# Patient Record
Sex: Female | Born: 1950 | Race: Black or African American | Hispanic: No | Marital: Married | Smoking: Never smoker
Health system: Southern US, Community
[De-identification: ages and names within clinical notes are randomized; demographics above are authoritative.]

## PROBLEM LIST (undated history)

## (undated) DIAGNOSIS — E669 Obesity, unspecified: Secondary | ICD-10-CM

## (undated) DIAGNOSIS — R7303 Prediabetes: Secondary | ICD-10-CM

## (undated) DIAGNOSIS — M1990 Unspecified osteoarthritis, unspecified site: Secondary | ICD-10-CM

## (undated) DIAGNOSIS — I10 Essential (primary) hypertension: Secondary | ICD-10-CM

## (undated) DIAGNOSIS — D649 Anemia, unspecified: Secondary | ICD-10-CM

## (undated) DIAGNOSIS — Z9221 Personal history of antineoplastic chemotherapy: Secondary | ICD-10-CM

## (undated) DIAGNOSIS — Z923 Personal history of irradiation: Secondary | ICD-10-CM

## (undated) DIAGNOSIS — C50919 Malignant neoplasm of unspecified site of unspecified female breast: Secondary | ICD-10-CM

## (undated) DIAGNOSIS — N289 Disorder of kidney and ureter, unspecified: Secondary | ICD-10-CM

## (undated) DIAGNOSIS — G4730 Sleep apnea, unspecified: Secondary | ICD-10-CM

## (undated) DIAGNOSIS — E559 Vitamin D deficiency, unspecified: Secondary | ICD-10-CM

## (undated) HISTORY — DX: Disorder of kidney and ureter, unspecified: N28.9

## (undated) HISTORY — DX: Vitamin D deficiency, unspecified: E55.9

## (undated) HISTORY — DX: Anemia, unspecified: D64.9

## (undated) HISTORY — PX: BREAST LUMPECTOMY: SHX2

## (undated) HISTORY — DX: Sleep apnea, unspecified: G47.30

## (undated) HISTORY — PX: ABDOMINAL HYSTERECTOMY: SHX81

## (undated) HISTORY — DX: Prediabetes: R73.03

## (undated) HISTORY — DX: Malignant neoplasm of unspecified site of unspecified female breast: C50.919

## (undated) HISTORY — DX: Obesity, unspecified: E66.9

## (undated) HISTORY — DX: Essential (primary) hypertension: I10

---

## 1997-09-25 ENCOUNTER — Other Ambulatory Visit: Admission: RE | Admit: 1997-09-25 | Discharge: 1997-09-25 | Payer: Self-pay | Admitting: Obstetrics and Gynecology

## 1999-05-24 ENCOUNTER — Encounter: Admission: RE | Admit: 1999-05-24 | Discharge: 1999-05-24 | Payer: Self-pay | Admitting: Obstetrics and Gynecology

## 1999-05-24 ENCOUNTER — Encounter: Payer: Self-pay | Admitting: Obstetrics and Gynecology

## 1999-06-01 ENCOUNTER — Other Ambulatory Visit: Admission: RE | Admit: 1999-06-01 | Discharge: 1999-06-01 | Payer: Self-pay | Admitting: Obstetrics and Gynecology

## 2000-08-03 ENCOUNTER — Encounter: Payer: Self-pay | Admitting: Obstetrics and Gynecology

## 2000-08-03 ENCOUNTER — Encounter: Admission: RE | Admit: 2000-08-03 | Discharge: 2000-08-03 | Payer: Self-pay | Admitting: Obstetrics and Gynecology

## 2000-09-14 ENCOUNTER — Other Ambulatory Visit: Admission: RE | Admit: 2000-09-14 | Discharge: 2000-09-14 | Payer: Self-pay | Admitting: Obstetrics and Gynecology

## 2000-10-12 ENCOUNTER — Encounter: Payer: Self-pay | Admitting: Obstetrics and Gynecology

## 2000-10-12 ENCOUNTER — Encounter: Admission: RE | Admit: 2000-10-12 | Discharge: 2000-10-12 | Payer: Self-pay | Admitting: Obstetrics and Gynecology

## 2000-12-05 ENCOUNTER — Ambulatory Visit (HOSPITAL_COMMUNITY): Admission: RE | Admit: 2000-12-05 | Discharge: 2000-12-05 | Payer: Self-pay | Admitting: Gastroenterology

## 2001-08-06 ENCOUNTER — Encounter: Payer: Self-pay | Admitting: Obstetrics and Gynecology

## 2001-08-06 ENCOUNTER — Encounter: Admission: RE | Admit: 2001-08-06 | Discharge: 2001-08-06 | Payer: Self-pay | Admitting: Obstetrics and Gynecology

## 2002-03-14 ENCOUNTER — Encounter: Payer: Self-pay | Admitting: Internal Medicine

## 2002-03-14 ENCOUNTER — Encounter: Admission: RE | Admit: 2002-03-14 | Discharge: 2002-03-14 | Payer: Self-pay | Admitting: Internal Medicine

## 2003-10-20 ENCOUNTER — Ambulatory Visit (HOSPITAL_COMMUNITY): Admission: RE | Admit: 2003-10-20 | Discharge: 2003-10-20 | Payer: Self-pay | Admitting: Obstetrics and Gynecology

## 2005-02-28 ENCOUNTER — Ambulatory Visit (HOSPITAL_COMMUNITY): Admission: RE | Admit: 2005-02-28 | Discharge: 2005-02-28 | Payer: Self-pay | Admitting: Obstetrics and Gynecology

## 2009-01-22 ENCOUNTER — Ambulatory Visit (HOSPITAL_COMMUNITY): Admission: RE | Admit: 2009-01-22 | Discharge: 2009-01-22 | Payer: Self-pay | Admitting: Obstetrics and Gynecology

## 2009-01-22 IMAGING — MG MM DIGITAL SCREENING
5 series · 5 of 5 positions shown · non-contrast
Comparison: none

DG SCREEN MAMMOGRAM BILATERAL
Bilateral CC and MLO view(s) were taken.

DIGITAL SCREENING MAMMOGRAM WITH CAD:
There are scattered fibroglandular densities.  No masses or malignant type calcifications are 
identified.
Images were processed with CAD.

[R CC]
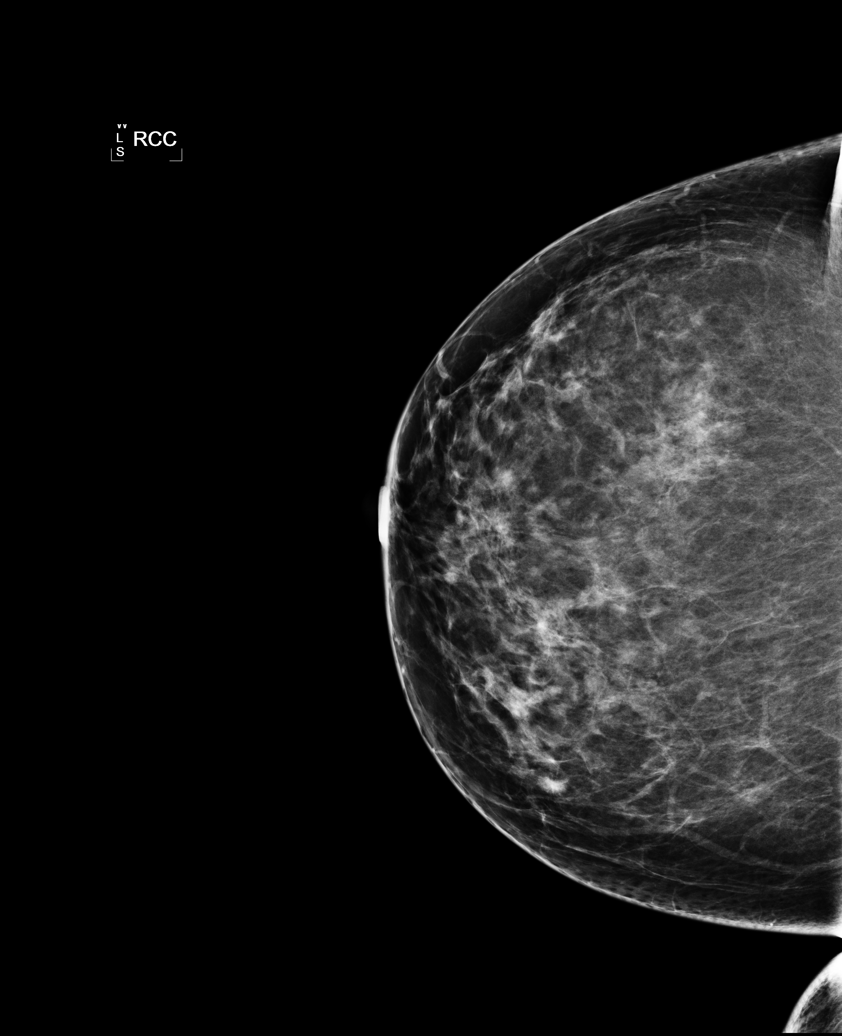

[R MLO]
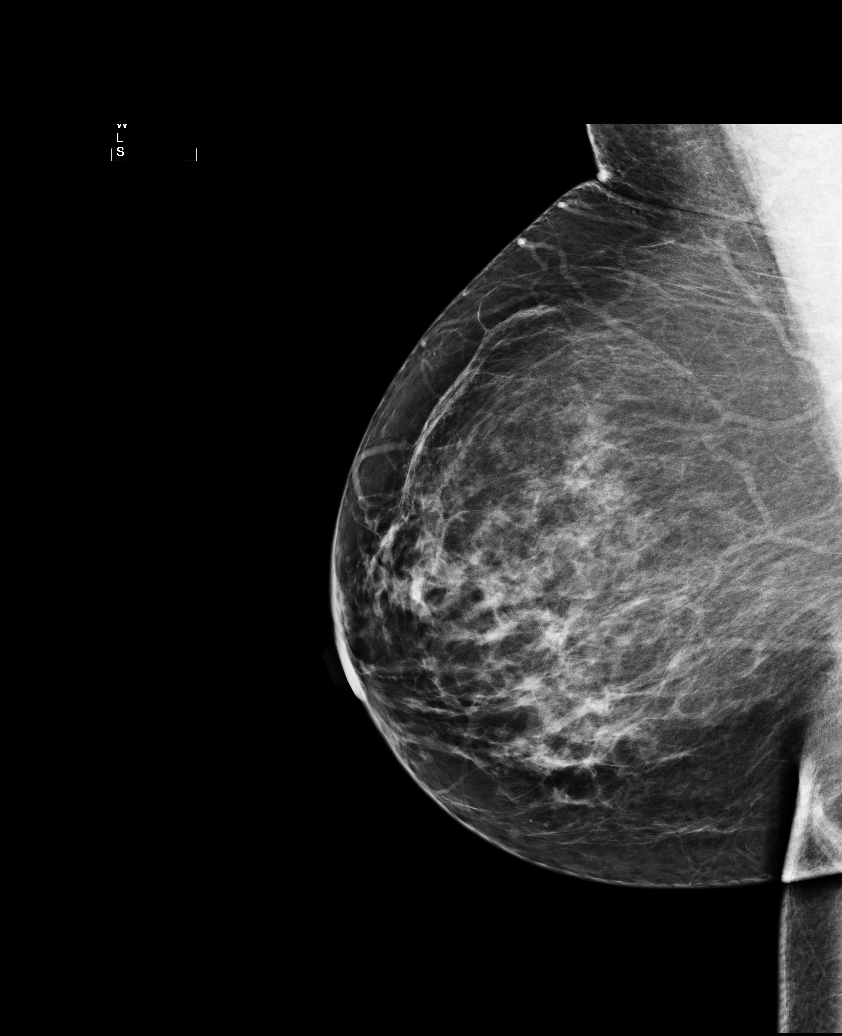

[L CC]
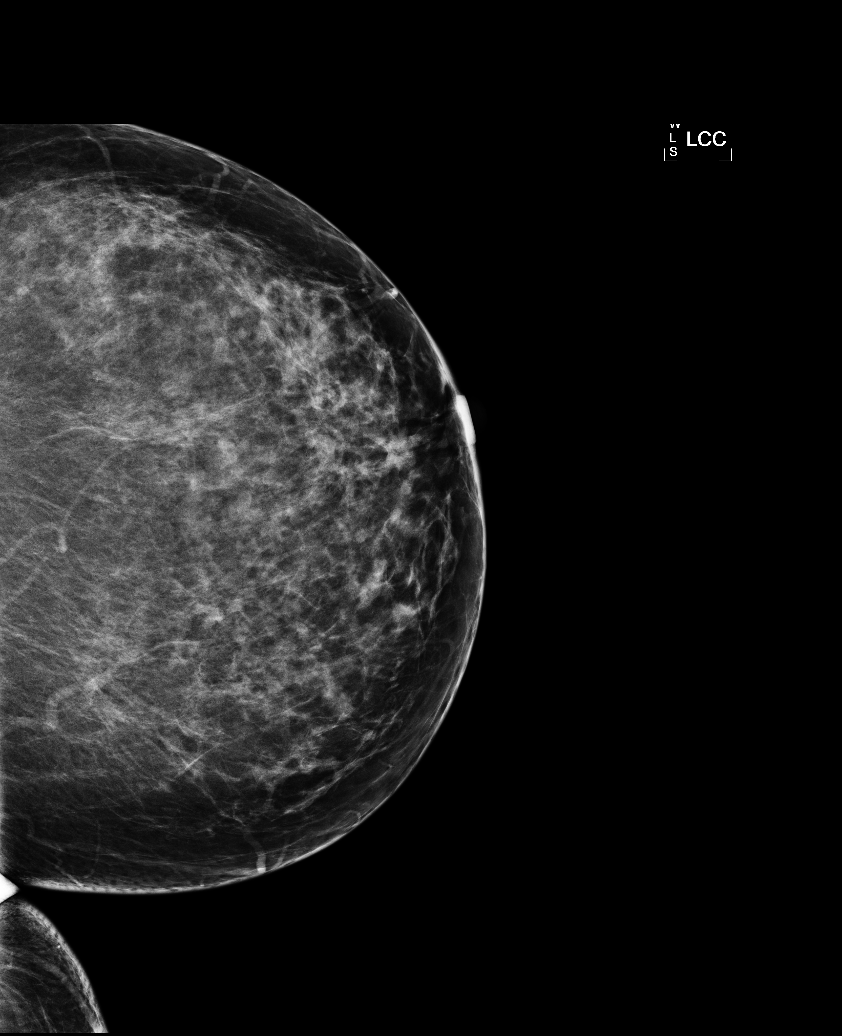

[L MLO (1 of 2)]
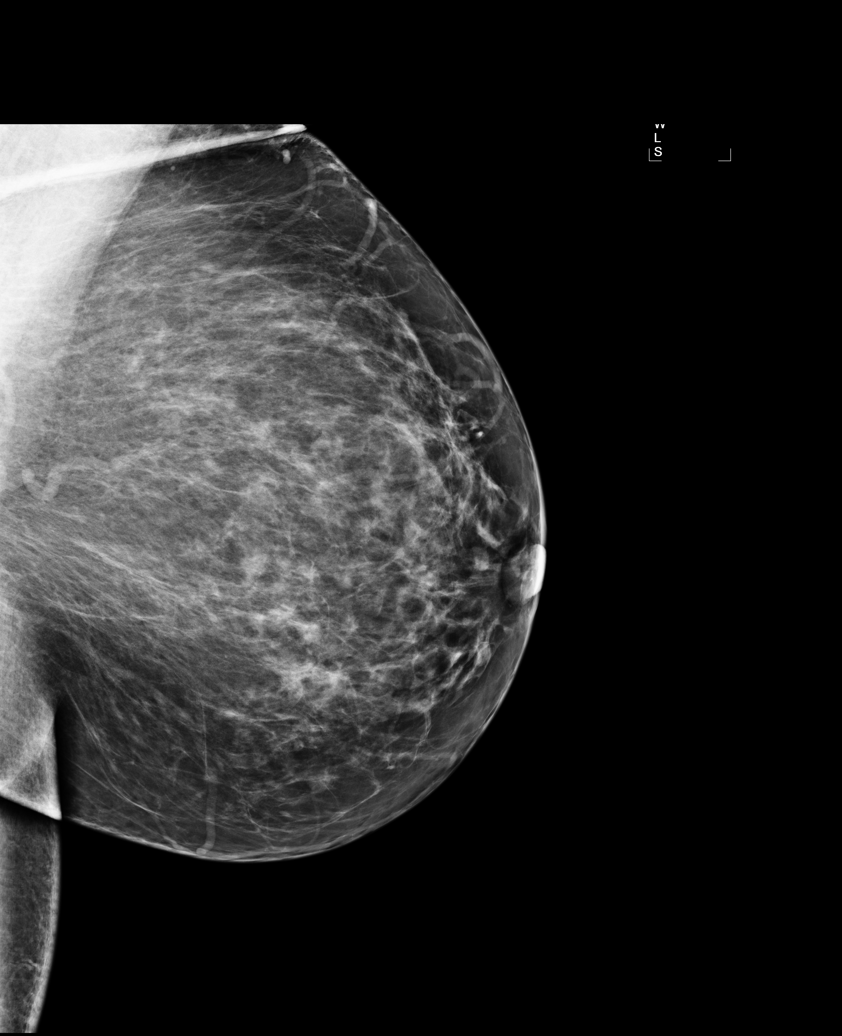

[L MLO (2 of 2)]
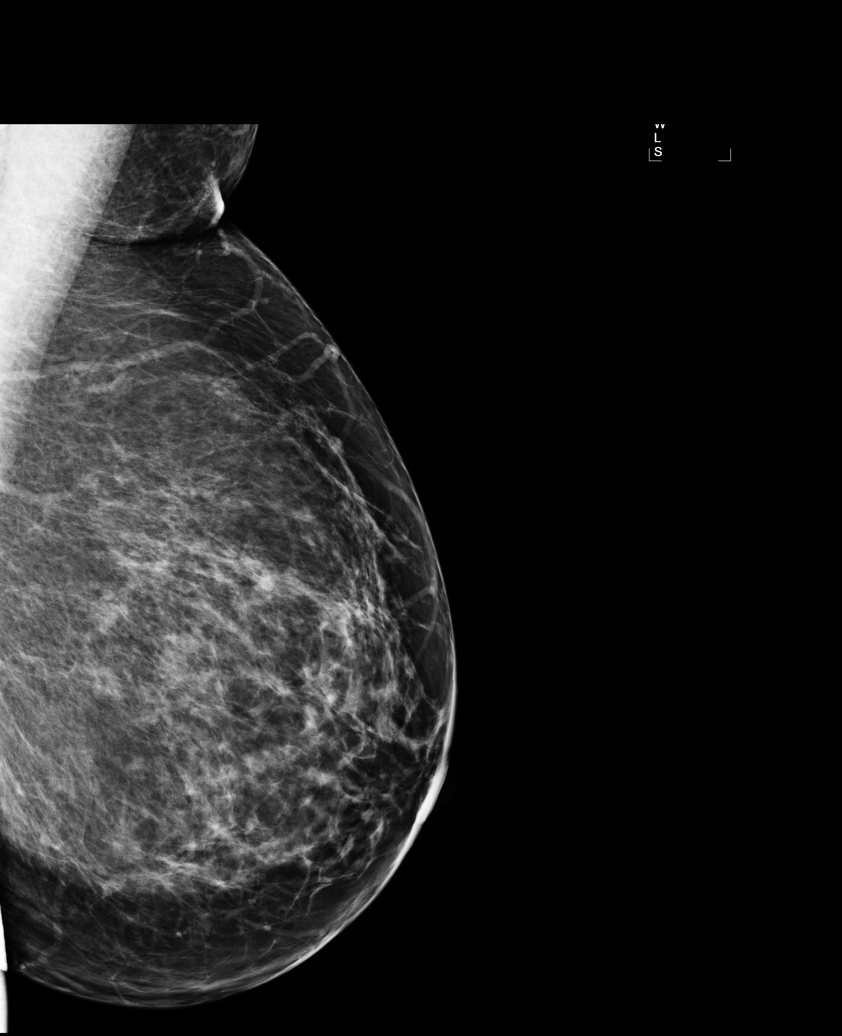

[5 of 5 positions shown; findings below may reference images not displayed]

IMPRESSION: No specific mammographic evidence of malignancy.  Next screening mammogram is recommended in one 
year.

A result letter of this screening mammogram will be mailed directly to the patient.

ASSESSMENT: Negative - BI-RADS 1

Screening mammogram in 1 year.
,

## 2009-03-11 ENCOUNTER — Emergency Department (HOSPITAL_COMMUNITY): Admission: EM | Admit: 2009-03-11 | Discharge: 2009-03-11 | Payer: Self-pay | Admitting: Family Medicine

## 2009-03-11 IMAGING — CR DG CHEST 2V
2 series · 2 of 2 positions shown · non-contrast
Comparison: None.

CLINICAL DATA: Cold.  Cough.  Congestion.

CHEST - 2 VIEW

[view not recorded (1 of 2)]
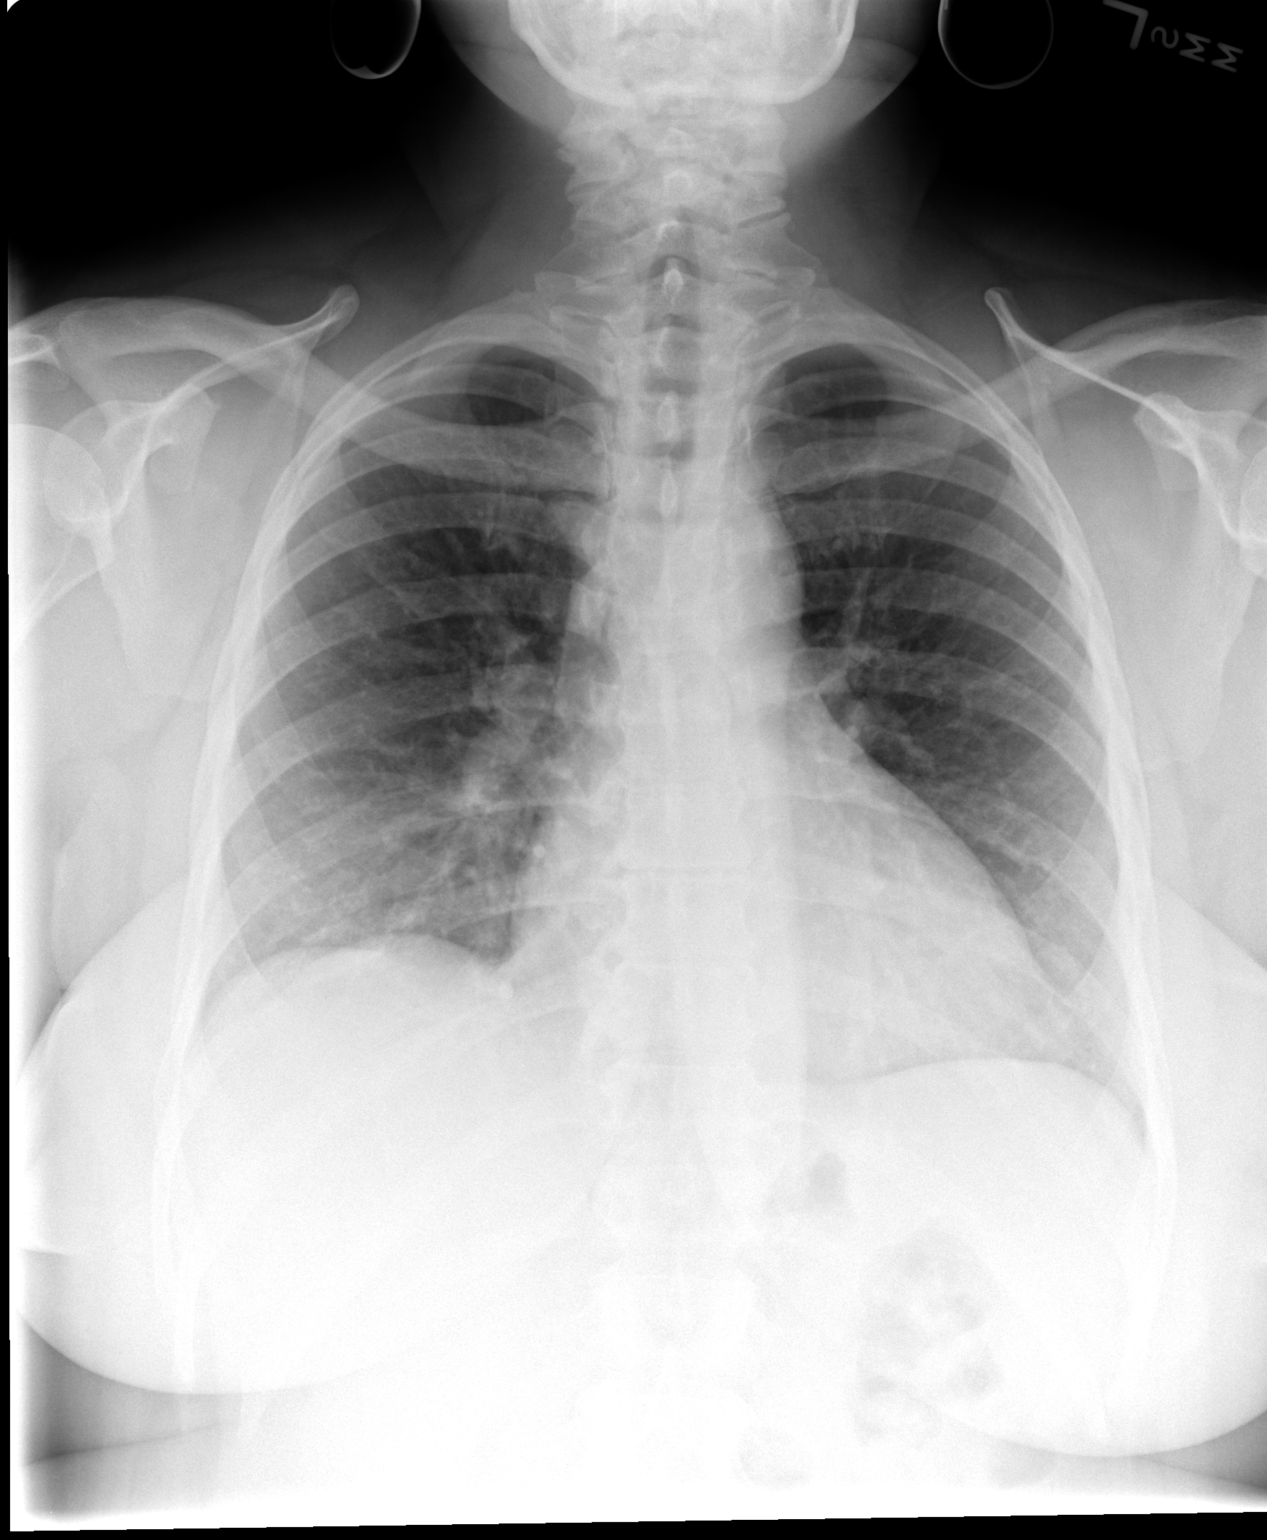

[view not recorded (2 of 2)]
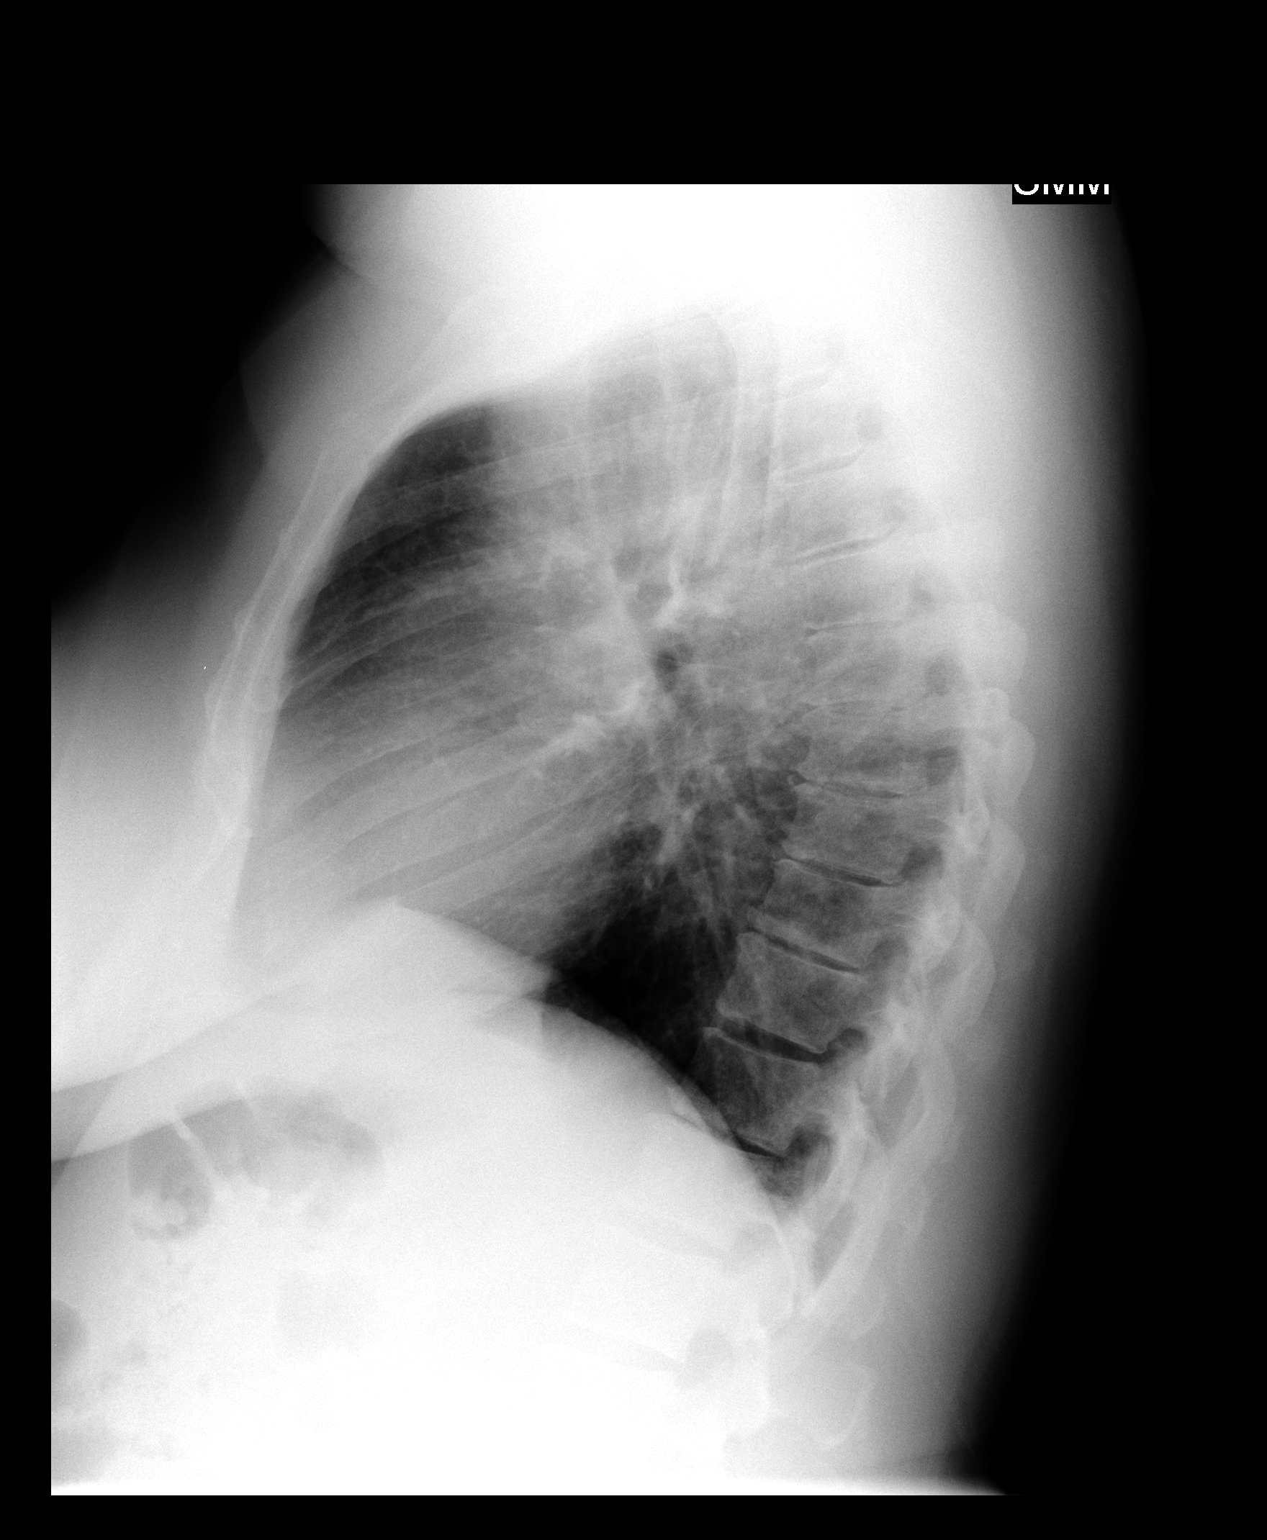

[2 of 2 positions shown; findings below may reference images not displayed]

FINDINGS: Normal cardiomediastinal silhouette.  Central bronchial
wall thickening.  Bilateral diffuse accentuation of
peribronchial/interstitial markings.  Findings are compatible with
bronchitis.  No infiltrate.  Intact bony thorax.  Mild spondylosis.
IMPRESSION: Findings compatible with chronic bronchitis.  Negative for
pneumonia.

## 2009-09-24 ENCOUNTER — Ambulatory Visit: Payer: Self-pay | Admitting: Family Medicine

## 2009-09-24 DIAGNOSIS — M19079 Primary osteoarthritis, unspecified ankle and foot: Secondary | ICD-10-CM | POA: Insufficient documentation

## 2009-09-24 DIAGNOSIS — M2020 Hallux rigidus, unspecified foot: Secondary | ICD-10-CM | POA: Insufficient documentation

## 2009-12-15 ENCOUNTER — Telehealth (INDEPENDENT_AMBULATORY_CARE_PROVIDER_SITE_OTHER): Payer: Self-pay | Admitting: *Deleted

## 2009-12-18 ENCOUNTER — Ambulatory Visit: Payer: Self-pay | Admitting: Family Medicine

## 2010-01-31 ENCOUNTER — Encounter: Payer: Self-pay | Admitting: Obstetrics and Gynecology

## 2010-02-09 NOTE — Assessment & Plan Note (Signed)
Summary: FEET ISSUES,MC   Vital Signs:  Patient profile:   60 year old female BP sitting:   144 / 90  History of Present Illness: 60 yo F here for bilateral foot pain  Patient reports having pain on inside of both great toes for past 3 weeks No known injury Pain centered  around 1st MTP from where she points. Some swelling especially on top of foot in this area Has to wear sandals because other shoes put pressure on this and it huts. 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 Has not tried any medicines for this.  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  No bunions or hallux valgus.  No redness or warmth. Mod-severe hallux rigidus with only  ~10-20 degrees dorsiflexion bilateral 1st MTPs. Pain on passive ROM of 1st MTPs. Long arch fairly well preserved.  No callus plantar aspect of metatarsal heads. 1+ BLE pitting edema.   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  Treat with topical voltaren if covered by insurance (mobic if not).  Needs wider and taller toebox in shoes - consider running shoes that allow more give.  Will try to buy some of these.  Pull insole out and use comforthotics - will add 1st ray post bilaterally tomorrow (no charge for the posting and no appointment needed).  See instructions for further.  This should improve over the next 4-6 wks.  Discussed x-rays and shots if not improving but shots not as effective for DJD of these joints.  Orders: [facility]  [employer] [phone])  Problem # 2:  ARTHRITIS, LEFT FOOT (ICD-716.97) Assessment: New see #1 above.  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 Apply small amount to affected areas qid 2)  Meloxicam 15 Mg Tabs (Meloxicam) .Marland Kitchen.. 1 tab by mouth daily with food  Patient Instructions: 1)  It was a pleasure to meet you today. 2)  You have arthritis in the joint at the base of your toe (MTP joint). 3)  Go to a running store ([employer], [employer]) and try on different shoes with a wide toe box - they can help you with this at these stores. 4)  Take the insole out of the shoes and put the green insole in them. 5)  Come back tomorrow afternoon (no appointment needed and I will not charge you for this) and I will put additional padding under your toes on the insert to help with pain and pushoff. 6)  Use the topical antiinflammatory gel up to 4 times a day regularly.  If insurance will not cover this, we can try a medicine by mouth once a day. 7)  Follow up in 3-4 weeks.  If not improving with this treatment, would consider either custom orthotics and/or injection into these joints. Prescriptions: MELOXICAM 15 MG TABS (MELOXICAM) 1 tab by mouth daily with food  #30 x 1   Entered  and Authorized by:   Norton Blizzard MD   Signed by:   Norton Blizzard MD on 09/24/2009   Method used:   Print then Give to Patient   RxID:   [id number] VOLTAREN 1 % GEL (DICLOFENAC SODIUM) Apply small amount to affected areas qid  #3 x 100g x 1   Entered and Authorized by:   Norton Blizzard MD   Signed by:   Norton Blizzard MD on 09/24/2009   Method used:   Print then Give to Patient   RxID:   [id number]

## 2010-02-09 NOTE — Progress Notes (Signed)
Summary: phone call  Phone Note Call from Patient   Caller: Patient Summary of Call: Spoke with patient about medication to make sure the patient was not using both. Patient informed that they could not afford the gel, but did get the oral medicaiton. Made appointment to see physician on Friday 12-18-09.  Initial call taken by: Kathi Simpers [facility]),  December 15, 2009 9:10 AM

## 2010-02-11 NOTE — Assessment & Plan Note (Signed)
Summary: FOOT PAIN/LP   Vital Signs:  Patient profile:   60 year old female Weight:      228.8 pounds Temp:     98.0 degrees F Pulse rate:   80 / minute BP sitting:   155 / 85  History of Present Illness: 60 yo F here for f/u bilateral 1st MTP DJD  Patient has taken mobic with some relief but not as much as she would like. Unable to try voltaren gel because she had filled meloxicam prior to this (we called and informed her again that she should only be taking one or the other). Continues to have pain in 1st MTPs with limited motion of these No known injury Not using inserts because she prefers shoes (bought danzgos) that these do not fit into Likes to walk but no other exercise.   Problems Prior to Update: 1)  Arthritis, Right Foot  (ICD-716.97) 2)  Arthritis, Left Foot  (ICD-716.97) 3)  Hallux Rigidus  (ICD-735.2)  Allergies: No Known Drug Allergies  Physical Exam  General:  NAD, overweight Msk:  Bilateral feet: swelling dorsal aspect of bilateral 1st MTPs.  No bunions or hallux valgus.  Crepitation noted.  No redness or warmth. Mod-severe hallux rigidus with only  ~10 degrees dorsiflexion bilateral 1st MTPs. Pain on passive ROM of 1st MTPs. Long arch fairly well preserved.  No callus plantar aspect of metatarsal heads.   Impression & Recommendations:  Problem # 1:  ARTHRITIS, RIGHT FOOT (ICD-716.97) Assessment Unchanged Would like to continue to pursue non-surgical options.  Stop meloxicam and try voltaren gel - may need prior authorization.  Less systemic absorption with gel so this may be better for her blood pressure as well.  Again discussed comfortable shoes.  Consider [employer] store to see if they have any cushions she could fit in her shoes to help under 1st MTP as first ray post.  Discussed injections and surgery (fusion) if not improving but would likely defer these discussions to ortho if conservative therapies not helping adequately.  Problem # 2:   ARTHRITIS, LEFT FOOT (ICD-716.97) Assessment: Unchanged See #1 above.  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 Apply small amount to affected areas qid 2)  Meloxicam 15 Mg Tabs (Meloxicam) .Marland Kitchen.. 1 tab by mouth daily with food   Orders Added: 1)  Est. Patient Level III [81191]

## 2010-03-13 ENCOUNTER — Encounter: Payer: Self-pay | Admitting: *Deleted

## 2010-05-28 NOTE — Procedures (Signed)
[place]  Patient:    Cheyenne Mcdonald, GORIS Visit Number: [id number] MRN: [id number]          Service Type: END Location: ENDO Attending Physician:  Charna Elizabeth Dictated by:   Anselmo Rod, M.D. Proc. Date: 12/05/00 Admit Date:  12/05/2000   CC:         Sheronette A. Cherly Hensen, M.D.  Kern Reap, M.D.   Procedure Report  DATE OF BIRTH:  05/22/1950.  PROCEDURE:  Colonoscopy.  ENDOSCOPIST:  Anselmo Rod, M.D.  INSTRUMENT USED:  Olympus video colonoscope.  INDICATION FOR PROCEDURE:  Rectal bleeding in a 61 year old African-American female.  Rule out colonic polyps, masses, hemorrhoids, etc.  PREPROCEDURE PREPARATION:  Informed consent was procured from the patient. The patient was fasted for eight hours prior to the procedure and prepped with a bottle of magnesium citrate and a gallon of NuLytely the night prior to the procedure.  She also received 400 mg of IV Cipro for mitral valve prophylaxis prior to the procedure.  PREPROCEDURE PHYSICAL:  VITAL SIGNS:  The patient had stable vital signs.  NECK:  Supple.  CHEST:  Clear to auscultation.  S1, S2 regular.  ABDOMEN:  Soft with normal bowel sounds.  DESCRIPTION OF PROCEDURE:  The patient was placed in the left lateral decubitus position and sedated with 60 mg of Demerol and 6 mg of Versed intravenously.  Once the patient was adequately sedate and maintained on low-flow oxygen and continuous cardiac monitoring, the Olympus video colonoscope was advanced from the rectum to the cecum without difficulty. The patient had a healthy-appearing colon.  No masses, polyps, erosions, ulcerations, or diverticula were seen.  Small internal hemorrhoids were appreciated on retroflexion in the rectum.  The patient tolerated the procedure well without complications.  IMPRESSION:  Normal colonoscopy except for small, nonbleeding internal hemorrhoid.  RECOMMENDATIONS: 1. A high-fiber  diet has been recommended along with liberal fluid intake, and    outpatient follow-up is advised as the need arises. 2. Repeat colorectal cancer screening is recommended in the next five years    unless the patient were to develop any abnormal symptoms in the interim. Dictated by:   Anselmo Rod, M.D. Attending Physician:  Charna Elizabeth DD:  12/05/00 TD:  12/06/00 Job: [id number]

## 2017-01-15 DIAGNOSIS — G4733 Obstructive sleep apnea (adult) (pediatric): Secondary | ICD-10-CM | POA: Diagnosis not present

## 2017-01-18 DIAGNOSIS — I119 Hypertensive heart disease without heart failure: Secondary | ICD-10-CM | POA: Diagnosis not present

## 2017-01-18 DIAGNOSIS — I10 Essential (primary) hypertension: Secondary | ICD-10-CM | POA: Diagnosis not present

## 2017-02-15 DIAGNOSIS — G4733 Obstructive sleep apnea (adult) (pediatric): Secondary | ICD-10-CM | POA: Diagnosis not present

## 2017-03-01 DIAGNOSIS — I10 Essential (primary) hypertension: Secondary | ICD-10-CM | POA: Diagnosis not present

## 2017-03-01 DIAGNOSIS — G4733 Obstructive sleep apnea (adult) (pediatric): Secondary | ICD-10-CM | POA: Diagnosis not present

## 2017-03-15 DIAGNOSIS — G4733 Obstructive sleep apnea (adult) (pediatric): Secondary | ICD-10-CM | POA: Diagnosis not present

## 2017-03-20 DIAGNOSIS — G4733 Obstructive sleep apnea (adult) (pediatric): Secondary | ICD-10-CM | POA: Diagnosis not present

## 2017-03-21 DIAGNOSIS — K5904 Chronic idiopathic constipation: Secondary | ICD-10-CM | POA: Diagnosis not present

## 2017-03-21 DIAGNOSIS — Z1211 Encounter for screening for malignant neoplasm of colon: Secondary | ICD-10-CM | POA: Diagnosis not present

## 2017-03-22 DIAGNOSIS — I10 Essential (primary) hypertension: Secondary | ICD-10-CM | POA: Diagnosis not present

## 2017-03-22 DIAGNOSIS — G4733 Obstructive sleep apnea (adult) (pediatric): Secondary | ICD-10-CM | POA: Diagnosis not present

## 2017-03-30 ENCOUNTER — Other Ambulatory Visit: Payer: Self-pay | Admitting: Internal Medicine

## 2017-03-30 DIAGNOSIS — Z1231 Encounter for screening mammogram for malignant neoplasm of breast: Secondary | ICD-10-CM

## 2017-04-05 DIAGNOSIS — H25813 Combined forms of age-related cataract, bilateral: Secondary | ICD-10-CM | POA: Diagnosis not present

## 2017-04-05 DIAGNOSIS — H40023 Open angle with borderline findings, high risk, bilateral: Secondary | ICD-10-CM | POA: Diagnosis not present

## 2017-04-05 DIAGNOSIS — H04123 Dry eye syndrome of bilateral lacrimal glands: Secondary | ICD-10-CM | POA: Diagnosis not present

## 2017-04-15 DIAGNOSIS — G4733 Obstructive sleep apnea (adult) (pediatric): Secondary | ICD-10-CM | POA: Diagnosis not present

## 2017-04-19 DIAGNOSIS — Z1211 Encounter for screening for malignant neoplasm of colon: Secondary | ICD-10-CM | POA: Diagnosis not present

## 2017-04-20 ENCOUNTER — Ambulatory Visit
Admission: RE | Admit: 2017-04-20 | Discharge: 2017-04-20 | Disposition: A | Discharge status: Home / Self Care | Payer: Medicare HMO | Source: Ambulatory Visit | Attending: Internal Medicine | Admitting: Internal Medicine

## 2017-04-20 ENCOUNTER — Ambulatory Visit: Payer: Self-pay

## 2017-04-20 DIAGNOSIS — Z1231 Encounter for screening mammogram for malignant neoplasm of breast: Secondary | ICD-10-CM

## 2017-04-24 DIAGNOSIS — M1711 Unilateral primary osteoarthritis, right knee: Secondary | ICD-10-CM | POA: Diagnosis not present

## 2017-04-24 DIAGNOSIS — G629 Polyneuropathy, unspecified: Secondary | ICD-10-CM | POA: Diagnosis not present

## 2017-04-24 DIAGNOSIS — I10 Essential (primary) hypertension: Secondary | ICD-10-CM | POA: Diagnosis not present

## 2017-04-24 DIAGNOSIS — Z131 Encounter for screening for diabetes mellitus: Secondary | ICD-10-CM | POA: Diagnosis not present

## 2017-04-24 DIAGNOSIS — I119 Hypertensive heart disease without heart failure: Secondary | ICD-10-CM | POA: Diagnosis not present

## 2017-04-24 DIAGNOSIS — Z136 Encounter for screening for cardiovascular disorders: Secondary | ICD-10-CM | POA: Diagnosis not present

## 2017-04-24 DIAGNOSIS — Z0000 Encounter for general adult medical examination without abnormal findings: Secondary | ICD-10-CM | POA: Diagnosis not present

## 2017-04-24 DIAGNOSIS — E559 Vitamin D deficiency, unspecified: Secondary | ICD-10-CM | POA: Diagnosis not present

## 2017-04-24 DIAGNOSIS — G4733 Obstructive sleep apnea (adult) (pediatric): Secondary | ICD-10-CM | POA: Diagnosis not present

## 2017-04-24 DIAGNOSIS — H9209 Otalgia, unspecified ear: Secondary | ICD-10-CM | POA: Diagnosis not present

## 2017-04-24 DIAGNOSIS — R7303 Prediabetes: Secondary | ICD-10-CM | POA: Diagnosis not present

## 2017-04-24 DIAGNOSIS — Z0001 Encounter for general adult medical examination with abnormal findings: Secondary | ICD-10-CM | POA: Diagnosis not present

## 2017-04-24 DIAGNOSIS — Z01118 Encounter for examination of ears and hearing with other abnormal findings: Secondary | ICD-10-CM | POA: Diagnosis not present

## 2017-04-26 DIAGNOSIS — I10 Essential (primary) hypertension: Secondary | ICD-10-CM | POA: Diagnosis not present

## 2017-04-26 DIAGNOSIS — I119 Hypertensive heart disease without heart failure: Secondary | ICD-10-CM | POA: Diagnosis not present

## 2017-05-15 DIAGNOSIS — G4733 Obstructive sleep apnea (adult) (pediatric): Secondary | ICD-10-CM | POA: Diagnosis not present

## 2017-05-30 DIAGNOSIS — G629 Polyneuropathy, unspecified: Secondary | ICD-10-CM | POA: Diagnosis not present

## 2017-05-30 DIAGNOSIS — R7303 Prediabetes: Secondary | ICD-10-CM | POA: Diagnosis not present

## 2017-05-30 DIAGNOSIS — I10 Essential (primary) hypertension: Secondary | ICD-10-CM | POA: Diagnosis not present

## 2017-05-30 DIAGNOSIS — E559 Vitamin D deficiency, unspecified: Secondary | ICD-10-CM | POA: Diagnosis not present

## 2017-05-30 DIAGNOSIS — J302 Other seasonal allergic rhinitis: Secondary | ICD-10-CM | POA: Diagnosis not present

## 2017-05-30 DIAGNOSIS — G4733 Obstructive sleep apnea (adult) (pediatric): Secondary | ICD-10-CM | POA: Diagnosis not present

## 2017-05-30 DIAGNOSIS — M1711 Unilateral primary osteoarthritis, right knee: Secondary | ICD-10-CM | POA: Diagnosis not present

## 2017-05-30 DIAGNOSIS — I119 Hypertensive heart disease without heart failure: Secondary | ICD-10-CM | POA: Diagnosis not present

## 2017-06-15 DIAGNOSIS — G4733 Obstructive sleep apnea (adult) (pediatric): Secondary | ICD-10-CM | POA: Diagnosis not present

## 2017-07-11 DIAGNOSIS — I119 Hypertensive heart disease without heart failure: Secondary | ICD-10-CM | POA: Diagnosis not present

## 2017-07-11 DIAGNOSIS — G4733 Obstructive sleep apnea (adult) (pediatric): Secondary | ICD-10-CM | POA: Diagnosis not present

## 2017-07-11 DIAGNOSIS — J302 Other seasonal allergic rhinitis: Secondary | ICD-10-CM | POA: Diagnosis not present

## 2017-07-11 DIAGNOSIS — I10 Essential (primary) hypertension: Secondary | ICD-10-CM | POA: Diagnosis not present

## 2017-07-11 DIAGNOSIS — E559 Vitamin D deficiency, unspecified: Secondary | ICD-10-CM | POA: Diagnosis not present

## 2017-07-11 DIAGNOSIS — M1711 Unilateral primary osteoarthritis, right knee: Secondary | ICD-10-CM | POA: Diagnosis not present

## 2017-07-11 DIAGNOSIS — G629 Polyneuropathy, unspecified: Secondary | ICD-10-CM | POA: Diagnosis not present

## 2017-07-11 DIAGNOSIS — R7303 Prediabetes: Secondary | ICD-10-CM | POA: Diagnosis not present

## 2017-07-15 DIAGNOSIS — G4733 Obstructive sleep apnea (adult) (pediatric): Secondary | ICD-10-CM | POA: Diagnosis not present

## 2017-08-15 DIAGNOSIS — G4733 Obstructive sleep apnea (adult) (pediatric): Secondary | ICD-10-CM | POA: Diagnosis not present

## 2017-09-15 DIAGNOSIS — G4733 Obstructive sleep apnea (adult) (pediatric): Secondary | ICD-10-CM | POA: Diagnosis not present

## 2017-10-09 DIAGNOSIS — I10 Essential (primary) hypertension: Secondary | ICD-10-CM | POA: Diagnosis not present

## 2017-10-09 DIAGNOSIS — G4733 Obstructive sleep apnea (adult) (pediatric): Secondary | ICD-10-CM | POA: Diagnosis not present

## 2017-10-09 DIAGNOSIS — J302 Other seasonal allergic rhinitis: Secondary | ICD-10-CM | POA: Diagnosis not present

## 2017-10-09 DIAGNOSIS — I119 Hypertensive heart disease without heart failure: Secondary | ICD-10-CM | POA: Diagnosis not present

## 2017-10-09 DIAGNOSIS — E559 Vitamin D deficiency, unspecified: Secondary | ICD-10-CM | POA: Diagnosis not present

## 2017-10-09 DIAGNOSIS — R7303 Prediabetes: Secondary | ICD-10-CM | POA: Diagnosis not present

## 2017-10-09 DIAGNOSIS — M1711 Unilateral primary osteoarthritis, right knee: Secondary | ICD-10-CM | POA: Diagnosis not present

## 2017-10-09 DIAGNOSIS — G629 Polyneuropathy, unspecified: Secondary | ICD-10-CM | POA: Diagnosis not present

## 2017-10-11 DIAGNOSIS — H25813 Combined forms of age-related cataract, bilateral: Secondary | ICD-10-CM | POA: Diagnosis not present

## 2017-10-11 DIAGNOSIS — H40023 Open angle with borderline findings, high risk, bilateral: Secondary | ICD-10-CM | POA: Diagnosis not present

## 2017-10-11 DIAGNOSIS — H04123 Dry eye syndrome of bilateral lacrimal glands: Secondary | ICD-10-CM | POA: Diagnosis not present

## 2017-10-15 DIAGNOSIS — G4733 Obstructive sleep apnea (adult) (pediatric): Secondary | ICD-10-CM | POA: Diagnosis not present

## 2017-10-30 DIAGNOSIS — R7303 Prediabetes: Secondary | ICD-10-CM | POA: Diagnosis not present

## 2017-10-30 DIAGNOSIS — E559 Vitamin D deficiency, unspecified: Secondary | ICD-10-CM | POA: Diagnosis not present

## 2017-10-30 DIAGNOSIS — E785 Hyperlipidemia, unspecified: Secondary | ICD-10-CM | POA: Diagnosis not present

## 2017-10-30 DIAGNOSIS — Z131 Encounter for screening for diabetes mellitus: Secondary | ICD-10-CM | POA: Diagnosis not present

## 2017-10-30 DIAGNOSIS — M1711 Unilateral primary osteoarthritis, right knee: Secondary | ICD-10-CM | POA: Diagnosis not present

## 2017-10-30 DIAGNOSIS — G629 Polyneuropathy, unspecified: Secondary | ICD-10-CM | POA: Diagnosis not present

## 2017-10-30 DIAGNOSIS — N182 Chronic kidney disease, stage 2 (mild): Secondary | ICD-10-CM | POA: Diagnosis not present

## 2017-10-30 DIAGNOSIS — E669 Obesity, unspecified: Secondary | ICD-10-CM | POA: Diagnosis not present

## 2017-10-30 DIAGNOSIS — Z0001 Encounter for general adult medical examination with abnormal findings: Secondary | ICD-10-CM | POA: Diagnosis not present

## 2017-10-30 DIAGNOSIS — I10 Essential (primary) hypertension: Secondary | ICD-10-CM | POA: Diagnosis not present

## 2017-10-30 DIAGNOSIS — G4733 Obstructive sleep apnea (adult) (pediatric): Secondary | ICD-10-CM | POA: Diagnosis not present

## 2017-10-30 DIAGNOSIS — Z23 Encounter for immunization: Secondary | ICD-10-CM | POA: Diagnosis not present

## 2017-10-30 DIAGNOSIS — I119 Hypertensive heart disease without heart failure: Secondary | ICD-10-CM | POA: Diagnosis not present

## 2017-11-15 DIAGNOSIS — G4733 Obstructive sleep apnea (adult) (pediatric): Secondary | ICD-10-CM | POA: Diagnosis not present

## 2017-12-01 DIAGNOSIS — N182 Chronic kidney disease, stage 2 (mild): Secondary | ICD-10-CM | POA: Diagnosis not present

## 2017-12-01 DIAGNOSIS — I10 Essential (primary) hypertension: Secondary | ICD-10-CM | POA: Diagnosis not present

## 2017-12-01 DIAGNOSIS — N390 Urinary tract infection, site not specified: Secondary | ICD-10-CM | POA: Diagnosis not present

## 2017-12-01 DIAGNOSIS — E559 Vitamin D deficiency, unspecified: Secondary | ICD-10-CM | POA: Diagnosis not present

## 2017-12-12 DIAGNOSIS — N182 Chronic kidney disease, stage 2 (mild): Secondary | ICD-10-CM | POA: Diagnosis not present

## 2017-12-15 DIAGNOSIS — G4733 Obstructive sleep apnea (adult) (pediatric): Secondary | ICD-10-CM | POA: Diagnosis not present

## 2017-12-18 DIAGNOSIS — G4733 Obstructive sleep apnea (adult) (pediatric): Secondary | ICD-10-CM | POA: Diagnosis not present

## 2017-12-18 DIAGNOSIS — I10 Essential (primary) hypertension: Secondary | ICD-10-CM | POA: Diagnosis not present

## 2017-12-18 DIAGNOSIS — M1711 Unilateral primary osteoarthritis, right knee: Secondary | ICD-10-CM | POA: Diagnosis not present

## 2017-12-18 DIAGNOSIS — I119 Hypertensive heart disease without heart failure: Secondary | ICD-10-CM | POA: Diagnosis not present

## 2017-12-18 DIAGNOSIS — N182 Chronic kidney disease, stage 2 (mild): Secondary | ICD-10-CM | POA: Diagnosis not present

## 2017-12-18 DIAGNOSIS — E559 Vitamin D deficiency, unspecified: Secondary | ICD-10-CM | POA: Diagnosis not present

## 2017-12-18 DIAGNOSIS — R7303 Prediabetes: Secondary | ICD-10-CM | POA: Diagnosis not present

## 2017-12-18 DIAGNOSIS — E782 Mixed hyperlipidemia: Secondary | ICD-10-CM | POA: Diagnosis not present

## 2017-12-18 DIAGNOSIS — L659 Nonscarring hair loss, unspecified: Secondary | ICD-10-CM | POA: Diagnosis not present

## 2017-12-22 DIAGNOSIS — N182 Chronic kidney disease, stage 2 (mild): Secondary | ICD-10-CM | POA: Diagnosis not present

## 2017-12-22 DIAGNOSIS — I10 Essential (primary) hypertension: Secondary | ICD-10-CM | POA: Diagnosis not present

## 2018-01-08 DIAGNOSIS — N390 Urinary tract infection, site not specified: Secondary | ICD-10-CM | POA: Diagnosis not present

## 2018-01-08 DIAGNOSIS — N182 Chronic kidney disease, stage 2 (mild): Secondary | ICD-10-CM | POA: Diagnosis not present

## 2018-01-08 DIAGNOSIS — E559 Vitamin D deficiency, unspecified: Secondary | ICD-10-CM | POA: Diagnosis not present

## 2018-01-08 DIAGNOSIS — I10 Essential (primary) hypertension: Secondary | ICD-10-CM | POA: Diagnosis not present

## 2018-01-15 DIAGNOSIS — G4733 Obstructive sleep apnea (adult) (pediatric): Secondary | ICD-10-CM | POA: Diagnosis not present

## 2018-01-15 DIAGNOSIS — I10 Essential (primary) hypertension: Secondary | ICD-10-CM | POA: Diagnosis not present

## 2018-01-15 DIAGNOSIS — E782 Mixed hyperlipidemia: Secondary | ICD-10-CM | POA: Diagnosis not present

## 2018-01-15 DIAGNOSIS — I119 Hypertensive heart disease without heart failure: Secondary | ICD-10-CM | POA: Diagnosis not present

## 2018-01-15 DIAGNOSIS — R7303 Prediabetes: Secondary | ICD-10-CM | POA: Diagnosis not present

## 2018-01-15 DIAGNOSIS — E559 Vitamin D deficiency, unspecified: Secondary | ICD-10-CM | POA: Diagnosis not present

## 2018-01-15 DIAGNOSIS — M1711 Unilateral primary osteoarthritis, right knee: Secondary | ICD-10-CM | POA: Diagnosis not present

## 2018-01-15 DIAGNOSIS — N182 Chronic kidney disease, stage 2 (mild): Secondary | ICD-10-CM | POA: Diagnosis not present

## 2018-01-15 DIAGNOSIS — E669 Obesity, unspecified: Secondary | ICD-10-CM | POA: Diagnosis not present

## 2018-05-17 DIAGNOSIS — E559 Vitamin D deficiency, unspecified: Secondary | ICD-10-CM | POA: Diagnosis not present

## 2018-05-17 DIAGNOSIS — I10 Essential (primary) hypertension: Secondary | ICD-10-CM | POA: Diagnosis not present

## 2018-05-17 DIAGNOSIS — N183 Chronic kidney disease, stage 3 (moderate): Secondary | ICD-10-CM | POA: Diagnosis not present

## 2018-05-17 DIAGNOSIS — N182 Chronic kidney disease, stage 2 (mild): Secondary | ICD-10-CM | POA: Diagnosis not present

## 2018-05-21 DIAGNOSIS — I10 Essential (primary) hypertension: Secondary | ICD-10-CM | POA: Diagnosis not present

## 2018-05-21 DIAGNOSIS — N182 Chronic kidney disease, stage 2 (mild): Secondary | ICD-10-CM | POA: Diagnosis not present

## 2018-05-21 DIAGNOSIS — E559 Vitamin D deficiency, unspecified: Secondary | ICD-10-CM | POA: Diagnosis not present

## 2018-05-21 DIAGNOSIS — N390 Urinary tract infection, site not specified: Secondary | ICD-10-CM | POA: Diagnosis not present

## 2018-06-14 DIAGNOSIS — H04123 Dry eye syndrome of bilateral lacrimal glands: Secondary | ICD-10-CM | POA: Diagnosis not present

## 2018-06-14 DIAGNOSIS — H25813 Combined forms of age-related cataract, bilateral: Secondary | ICD-10-CM | POA: Diagnosis not present

## 2018-06-14 DIAGNOSIS — H40023 Open angle with borderline findings, high risk, bilateral: Secondary | ICD-10-CM | POA: Diagnosis not present

## 2018-06-28 DIAGNOSIS — E559 Vitamin D deficiency, unspecified: Secondary | ICD-10-CM | POA: Diagnosis not present

## 2018-06-28 DIAGNOSIS — N183 Chronic kidney disease, stage 3 (moderate): Secondary | ICD-10-CM | POA: Diagnosis not present

## 2018-06-28 DIAGNOSIS — I10 Essential (primary) hypertension: Secondary | ICD-10-CM | POA: Diagnosis not present

## 2018-06-28 DIAGNOSIS — N182 Chronic kidney disease, stage 2 (mild): Secondary | ICD-10-CM | POA: Diagnosis not present

## 2018-07-02 DIAGNOSIS — E559 Vitamin D deficiency, unspecified: Secondary | ICD-10-CM | POA: Diagnosis not present

## 2018-07-02 DIAGNOSIS — N182 Chronic kidney disease, stage 2 (mild): Secondary | ICD-10-CM | POA: Diagnosis not present

## 2018-07-02 DIAGNOSIS — N390 Urinary tract infection, site not specified: Secondary | ICD-10-CM | POA: Diagnosis not present

## 2018-07-02 DIAGNOSIS — I10 Essential (primary) hypertension: Secondary | ICD-10-CM | POA: Diagnosis not present

## 2018-07-23 ENCOUNTER — Other Ambulatory Visit: Payer: Self-pay | Admitting: Internal Medicine

## 2018-07-23 DIAGNOSIS — Z1231 Encounter for screening mammogram for malignant neoplasm of breast: Secondary | ICD-10-CM

## 2018-09-05 ENCOUNTER — Other Ambulatory Visit: Payer: Self-pay

## 2018-09-05 ENCOUNTER — Ambulatory Visit
Admission: RE | Admit: 2018-09-05 | Discharge: 2018-09-05 | Disposition: A | Discharge status: Home / Self Care | Payer: Medicare HMO | Source: Ambulatory Visit | Attending: Internal Medicine | Admitting: Internal Medicine

## 2018-09-05 DIAGNOSIS — Z1231 Encounter for screening mammogram for malignant neoplasm of breast: Secondary | ICD-10-CM | POA: Diagnosis not present

## 2018-09-05 IMAGING — MG DIGITAL SCREENING BILATERAL MAMMOGRAM WITH TOMO AND CAD
6 of 12 series · 6 of 36 positions shown · non-contrast
Comparison: Previous exam(s).

CLINICAL DATA: Screening.

EXAM:
DIGITAL SCREENING BILATERAL MAMMOGRAM WITH TOMO AND CAD

[L MLO synth-2D (1 of 2)]
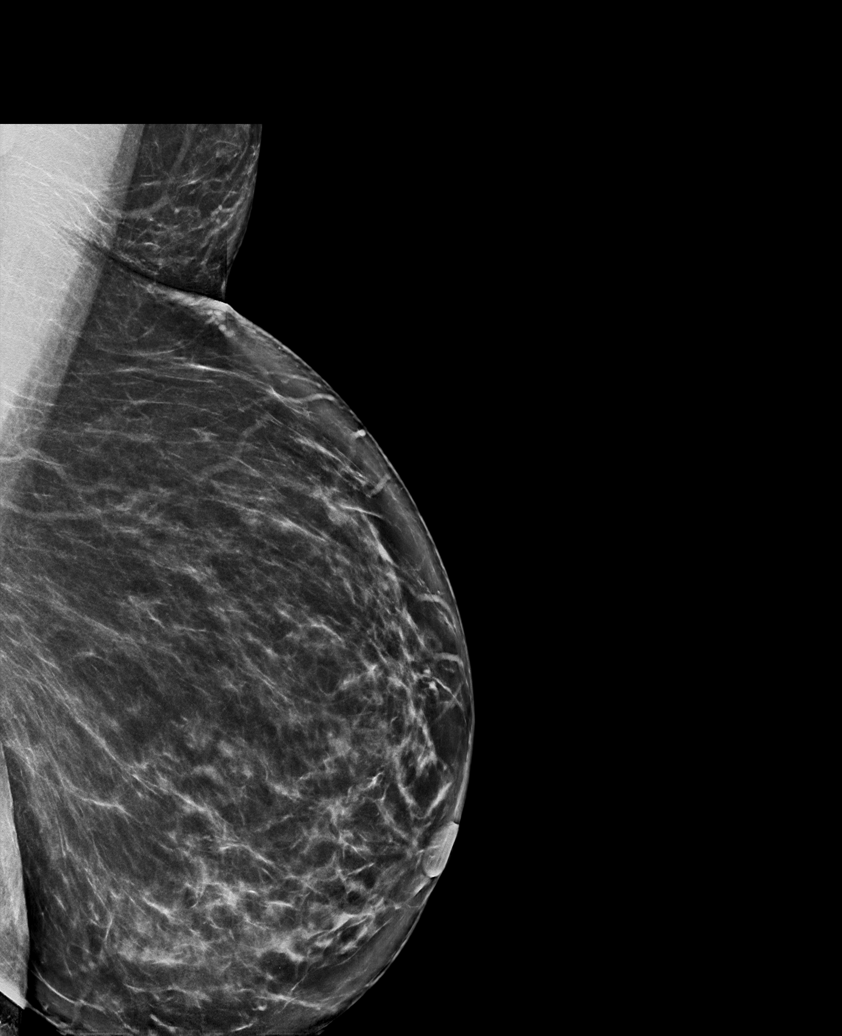

[R CC synth-2D]
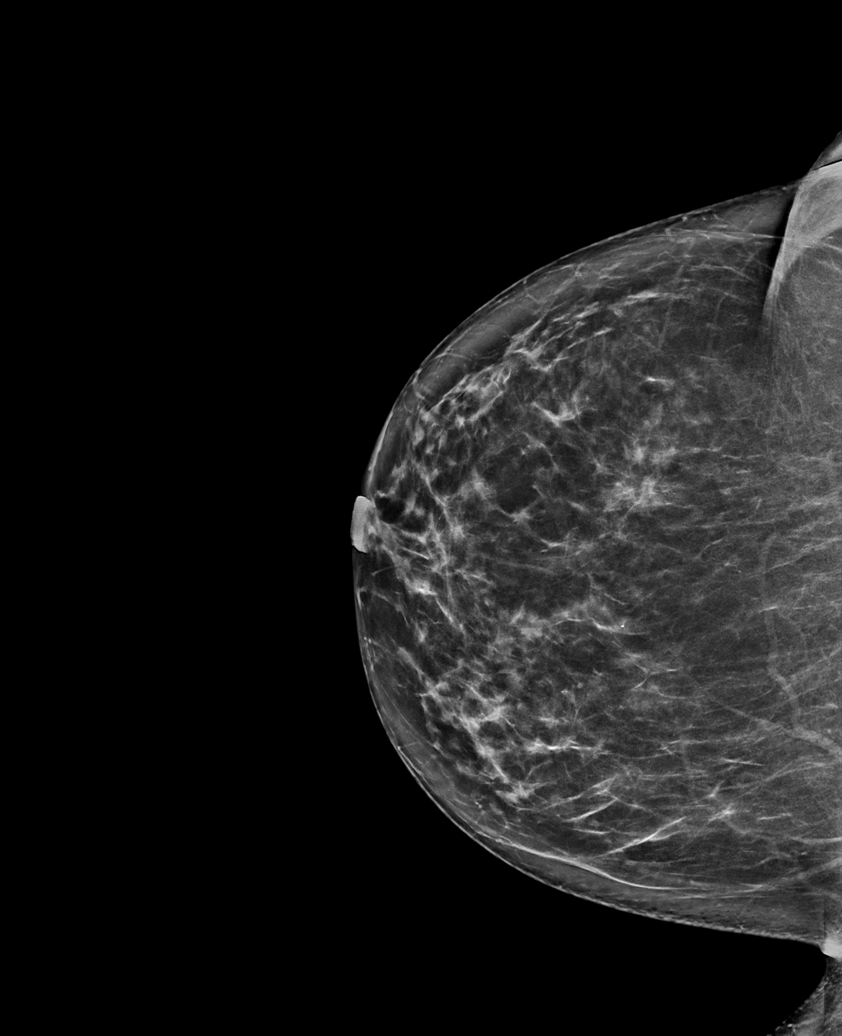

[L CC synth-2D (1 of 2)]
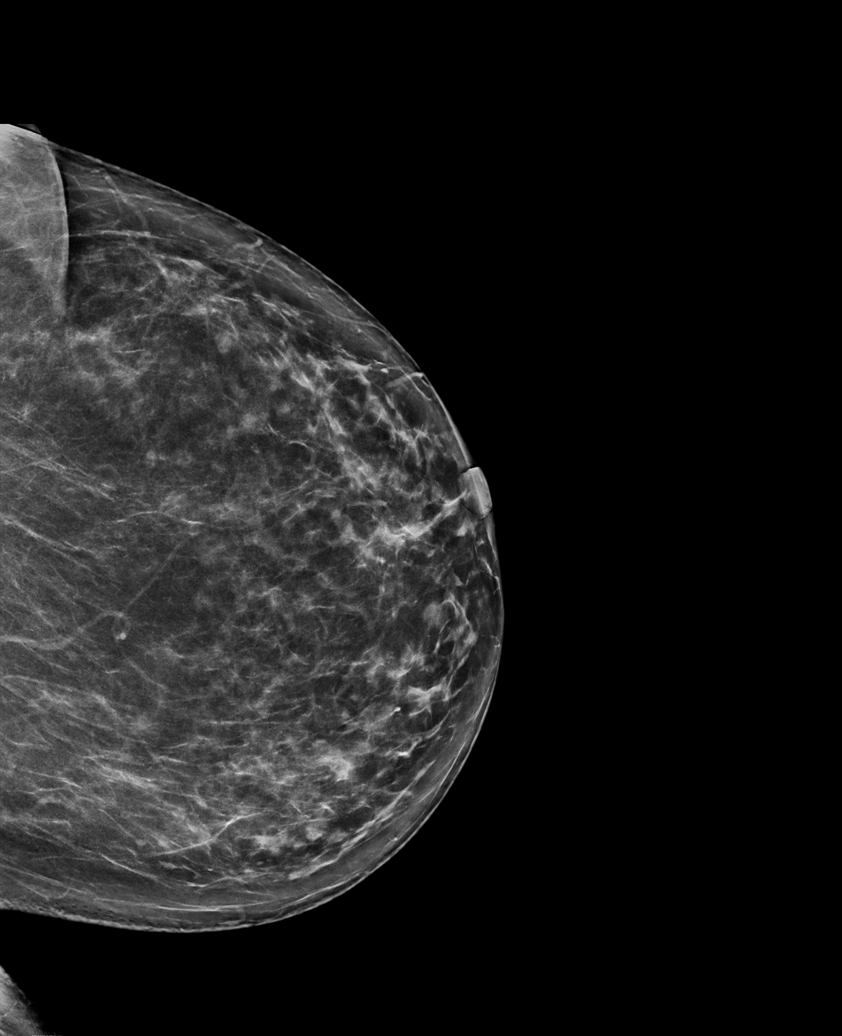

[L CC synth-2D (2 of 2)]
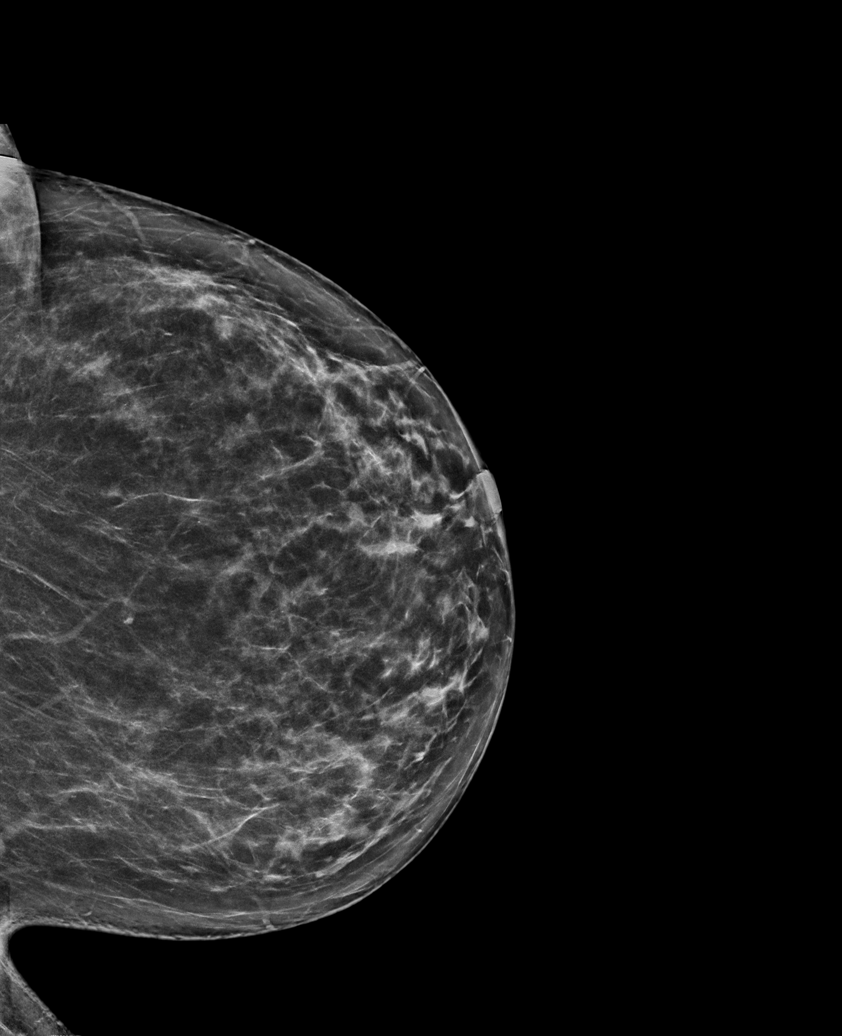

[L MLO synth-2D (2 of 2)]
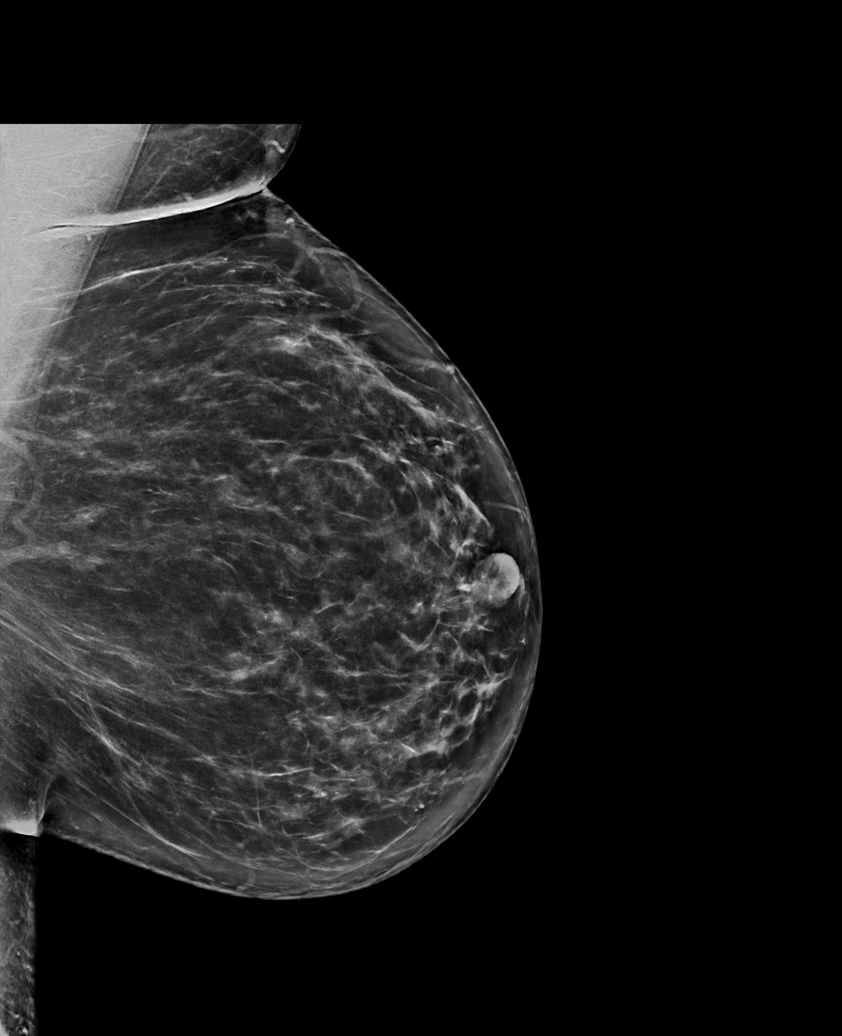

[R MLO synth-2D]
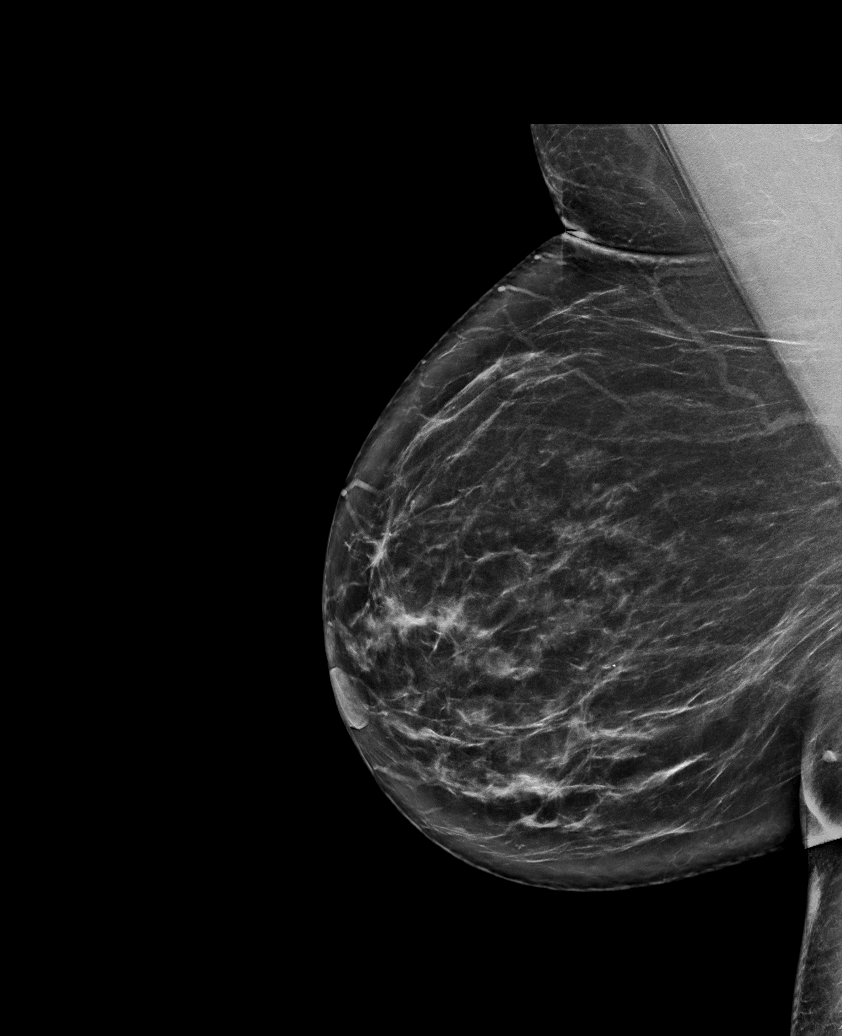

[6 of 36 positions shown; findings below may reference images not displayed]

ACR Breast Density Category b: There are scattered areas of
fibroglandular density.
FINDINGS: There are no findings suspicious for malignancy. Images were
processed with CAD.
IMPRESSION: No mammographic evidence of malignancy. A result letter of this
screening mammogram will be mailed directly to the patient.

RECOMMENDATION:
Screening mammogram in one year. (Code:[TQ])

BI-RADS CATEGORY  1: Negative.

## 2018-10-18 DIAGNOSIS — H25813 Combined forms of age-related cataract, bilateral: Secondary | ICD-10-CM | POA: Diagnosis not present

## 2018-10-18 DIAGNOSIS — H04123 Dry eye syndrome of bilateral lacrimal glands: Secondary | ICD-10-CM | POA: Diagnosis not present

## 2018-10-18 DIAGNOSIS — H40023 Open angle with borderline findings, high risk, bilateral: Secondary | ICD-10-CM | POA: Diagnosis not present

## 2018-12-11 DIAGNOSIS — I119 Hypertensive heart disease without heart failure: Secondary | ICD-10-CM | POA: Diagnosis not present

## 2018-12-11 DIAGNOSIS — N182 Chronic kidney disease, stage 2 (mild): Secondary | ICD-10-CM | POA: Diagnosis not present

## 2018-12-11 DIAGNOSIS — M1711 Unilateral primary osteoarthritis, right knee: Secondary | ICD-10-CM | POA: Diagnosis not present

## 2018-12-11 DIAGNOSIS — G4733 Obstructive sleep apnea (adult) (pediatric): Secondary | ICD-10-CM | POA: Diagnosis not present

## 2018-12-11 DIAGNOSIS — Z6841 Body Mass Index (BMI) 40.0 and over, adult: Secondary | ICD-10-CM | POA: Diagnosis not present

## 2018-12-11 DIAGNOSIS — E782 Mixed hyperlipidemia: Secondary | ICD-10-CM | POA: Diagnosis not present

## 2018-12-11 DIAGNOSIS — R7303 Prediabetes: Secondary | ICD-10-CM | POA: Diagnosis not present

## 2018-12-11 DIAGNOSIS — I10 Essential (primary) hypertension: Secondary | ICD-10-CM | POA: Diagnosis not present

## 2018-12-11 DIAGNOSIS — E669 Obesity, unspecified: Secondary | ICD-10-CM | POA: Diagnosis not present

## 2018-12-11 DIAGNOSIS — E559 Vitamin D deficiency, unspecified: Secondary | ICD-10-CM | POA: Diagnosis not present

## 2019-01-29 DIAGNOSIS — I10 Essential (primary) hypertension: Secondary | ICD-10-CM | POA: Diagnosis not present

## 2019-01-29 DIAGNOSIS — Z6841 Body Mass Index (BMI) 40.0 and over, adult: Secondary | ICD-10-CM | POA: Diagnosis not present

## 2019-01-29 DIAGNOSIS — N182 Chronic kidney disease, stage 2 (mild): Secondary | ICD-10-CM | POA: Diagnosis not present

## 2019-01-29 DIAGNOSIS — E782 Mixed hyperlipidemia: Secondary | ICD-10-CM | POA: Diagnosis not present

## 2019-01-29 DIAGNOSIS — G4733 Obstructive sleep apnea (adult) (pediatric): Secondary | ICD-10-CM | POA: Diagnosis not present

## 2019-01-29 DIAGNOSIS — E559 Vitamin D deficiency, unspecified: Secondary | ICD-10-CM | POA: Diagnosis not present

## 2019-01-29 DIAGNOSIS — I119 Hypertensive heart disease without heart failure: Secondary | ICD-10-CM | POA: Diagnosis not present

## 2019-01-29 DIAGNOSIS — R7303 Prediabetes: Secondary | ICD-10-CM | POA: Diagnosis not present

## 2019-01-29 DIAGNOSIS — M1711 Unilateral primary osteoarthritis, right knee: Secondary | ICD-10-CM | POA: Diagnosis not present

## 2019-01-29 DIAGNOSIS — E669 Obesity, unspecified: Secondary | ICD-10-CM | POA: Diagnosis not present

## 2019-01-31 ENCOUNTER — Ambulatory Visit: Payer: Medicare HMO | Attending: Internal Medicine

## 2019-01-31 DIAGNOSIS — Z23 Encounter for immunization: Secondary | ICD-10-CM

## 2019-01-31 NOTE — Progress Notes (Signed)
   Covid-19 Vaccination Clinic  Name:  Cheyenne Mcdonald    MRN: [id number] DOB: 08-26-1950  01/31/2019  Cheyenne Mcdonald was observed post Covid-19 immunization for 15 minutes without incidence. She was provided with Vaccine Information Sheet and instruction to access the V-Safe system.   Cheyenne Mcdonald was instructed to call 911 with any severe reactions post vaccine: Marland Kitchen Difficulty breathing  . Swelling of your face and throat  . A fast heartbeat  . A bad rash all over your body  . Dizziness and weakness    Immunizations Administered    Name Date Dose VIS Date Route   Pfizer COVID-19 Vaccine 01/31/2019  9:43 AM 0.3 mL 12/21/2018 Intramuscular   Manufacturer: [place]   Lot: [id number]   [place]: [id number]

## 2019-02-20 ENCOUNTER — Ambulatory Visit: Payer: Medicare HMO | Attending: Internal Medicine

## 2019-02-20 DIAGNOSIS — Z23 Encounter for immunization: Secondary | ICD-10-CM | POA: Insufficient documentation

## 2019-02-20 NOTE — Progress Notes (Signed)
   Covid-19 Vaccination Clinic  Name:  KATRISHA AMUSO    MRN: [id number] DOB: 1950/09/23  02/20/2019  Ms. Minnis was observed post Covid-19 immunization for 15 minutes without incidence. She was provided with Vaccine Information Sheet and instruction to access the V-Safe system.   Ms. Kieran was instructed to call 911 with any severe reactions post vaccine: Marland Kitchen Difficulty breathing  . Swelling of your face and throat  . A fast heartbeat  . A bad rash all over your body  . Dizziness and weakness    Immunizations Administered    Name Date Dose VIS Date Route   Pfizer COVID-19 Vaccine 02/20/2019  3:42 PM 0.3 mL 12/21/2018 Intramuscular   Manufacturer: [place]   Lot: [id number]   [place]: [id number]

## 2019-04-22 DIAGNOSIS — H40023 Open angle with borderline findings, high risk, bilateral: Secondary | ICD-10-CM | POA: Diagnosis not present

## 2019-06-24 DIAGNOSIS — H40023 Open angle with borderline findings, high risk, bilateral: Secondary | ICD-10-CM | POA: Diagnosis not present

## 2019-07-09 DIAGNOSIS — I119 Hypertensive heart disease without heart failure: Secondary | ICD-10-CM | POA: Diagnosis not present

## 2019-07-09 DIAGNOSIS — G4733 Obstructive sleep apnea (adult) (pediatric): Secondary | ICD-10-CM | POA: Diagnosis not present

## 2019-07-09 DIAGNOSIS — E782 Mixed hyperlipidemia: Secondary | ICD-10-CM | POA: Diagnosis not present

## 2019-07-09 DIAGNOSIS — E559 Vitamin D deficiency, unspecified: Secondary | ICD-10-CM | POA: Diagnosis not present

## 2019-07-09 DIAGNOSIS — N182 Chronic kidney disease, stage 2 (mild): Secondary | ICD-10-CM | POA: Diagnosis not present

## 2019-07-09 DIAGNOSIS — R7303 Prediabetes: Secondary | ICD-10-CM | POA: Diagnosis not present

## 2019-07-09 DIAGNOSIS — E669 Obesity, unspecified: Secondary | ICD-10-CM | POA: Diagnosis not present

## 2019-07-09 DIAGNOSIS — M1711 Unilateral primary osteoarthritis, right knee: Secondary | ICD-10-CM | POA: Diagnosis not present

## 2019-07-19 DIAGNOSIS — Z9989 Dependence on other enabling machines and devices: Secondary | ICD-10-CM | POA: Diagnosis not present

## 2019-07-19 DIAGNOSIS — G4733 Obstructive sleep apnea (adult) (pediatric): Secondary | ICD-10-CM | POA: Diagnosis not present

## 2019-07-22 DIAGNOSIS — N182 Chronic kidney disease, stage 2 (mild): Secondary | ICD-10-CM | POA: Diagnosis not present

## 2019-07-22 DIAGNOSIS — I10 Essential (primary) hypertension: Secondary | ICD-10-CM | POA: Diagnosis not present

## 2019-07-22 DIAGNOSIS — E559 Vitamin D deficiency, unspecified: Secondary | ICD-10-CM | POA: Diagnosis not present

## 2019-07-25 DIAGNOSIS — I10 Essential (primary) hypertension: Secondary | ICD-10-CM | POA: Diagnosis not present

## 2019-07-25 DIAGNOSIS — N182 Chronic kidney disease, stage 2 (mild): Secondary | ICD-10-CM | POA: Diagnosis not present

## 2019-07-25 DIAGNOSIS — N390 Urinary tract infection, site not specified: Secondary | ICD-10-CM | POA: Diagnosis not present

## 2019-07-25 DIAGNOSIS — E559 Vitamin D deficiency, unspecified: Secondary | ICD-10-CM | POA: Diagnosis not present

## 2019-08-01 DIAGNOSIS — G4710 Hypersomnia, unspecified: Secondary | ICD-10-CM | POA: Diagnosis not present

## 2019-08-02 DIAGNOSIS — Z9989 Dependence on other enabling machines and devices: Secondary | ICD-10-CM | POA: Diagnosis not present

## 2019-08-02 DIAGNOSIS — G4710 Hypersomnia, unspecified: Secondary | ICD-10-CM | POA: Diagnosis not present

## 2019-08-02 DIAGNOSIS — G4733 Obstructive sleep apnea (adult) (pediatric): Secondary | ICD-10-CM | POA: Diagnosis not present

## 2019-09-12 ENCOUNTER — Other Ambulatory Visit: Payer: Self-pay | Admitting: Physician Assistant

## 2019-09-12 DIAGNOSIS — R7303 Prediabetes: Secondary | ICD-10-CM | POA: Diagnosis not present

## 2019-09-12 DIAGNOSIS — N182 Chronic kidney disease, stage 2 (mild): Secondary | ICD-10-CM | POA: Diagnosis not present

## 2019-09-12 DIAGNOSIS — M1711 Unilateral primary osteoarthritis, right knee: Secondary | ICD-10-CM | POA: Diagnosis not present

## 2019-09-12 DIAGNOSIS — E782 Mixed hyperlipidemia: Secondary | ICD-10-CM | POA: Diagnosis not present

## 2019-09-12 DIAGNOSIS — E669 Obesity, unspecified: Secondary | ICD-10-CM | POA: Diagnosis not present

## 2019-09-12 DIAGNOSIS — G4733 Obstructive sleep apnea (adult) (pediatric): Secondary | ICD-10-CM | POA: Diagnosis not present

## 2019-09-12 DIAGNOSIS — N63 Unspecified lump in unspecified breast: Secondary | ICD-10-CM

## 2019-09-12 DIAGNOSIS — N6320 Unspecified lump in the left breast, unspecified quadrant: Secondary | ICD-10-CM | POA: Diagnosis not present

## 2019-09-12 DIAGNOSIS — E559 Vitamin D deficiency, unspecified: Secondary | ICD-10-CM | POA: Diagnosis not present

## 2019-09-13 ENCOUNTER — Ambulatory Visit
Admission: RE | Admit: 2019-09-13 | Discharge: 2019-09-13 | Disposition: A | Discharge status: Home / Self Care | Payer: Medicare HMO | Source: Ambulatory Visit | Attending: Physician Assistant | Admitting: Physician Assistant

## 2019-09-13 ENCOUNTER — Other Ambulatory Visit: Payer: Self-pay

## 2019-09-13 ENCOUNTER — Other Ambulatory Visit: Payer: Self-pay | Admitting: Physician Assistant

## 2019-09-13 DIAGNOSIS — N6489 Other specified disorders of breast: Secondary | ICD-10-CM | POA: Diagnosis not present

## 2019-09-13 DIAGNOSIS — R599 Enlarged lymph nodes, unspecified: Secondary | ICD-10-CM

## 2019-09-13 DIAGNOSIS — R928 Other abnormal and inconclusive findings on diagnostic imaging of breast: Secondary | ICD-10-CM | POA: Diagnosis not present

## 2019-09-13 DIAGNOSIS — N63 Unspecified lump in unspecified breast: Secondary | ICD-10-CM

## 2019-09-13 IMAGING — MG DIGITAL DIAGNOSTIC BILAT W/ TOMO W/ CAD
6 of 12 series · 6 of 36 positions shown · non-contrast
Comparison: Previous exam(s).

CLINICAL DATA: 69-year-old female presenting for evaluation of a
palpable lump in the left breast identified about 1 week ago.

EXAM:
DIGITAL DIAGNOSTIC BILATERAL MAMMOGRAM WITH TOMO AND CAD; ULTRASOUND
LEFT BREAST LIMITED

[R MLO synth-2D (1 of 2)]
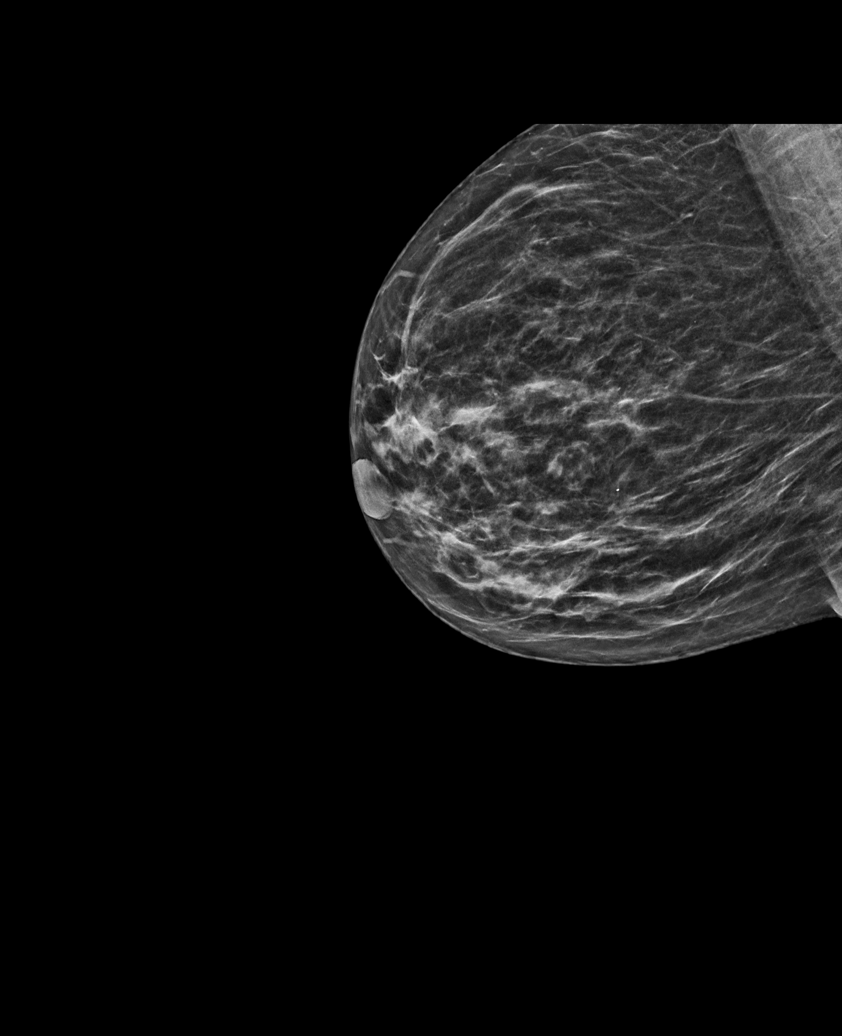

[L TAN synth-2D]
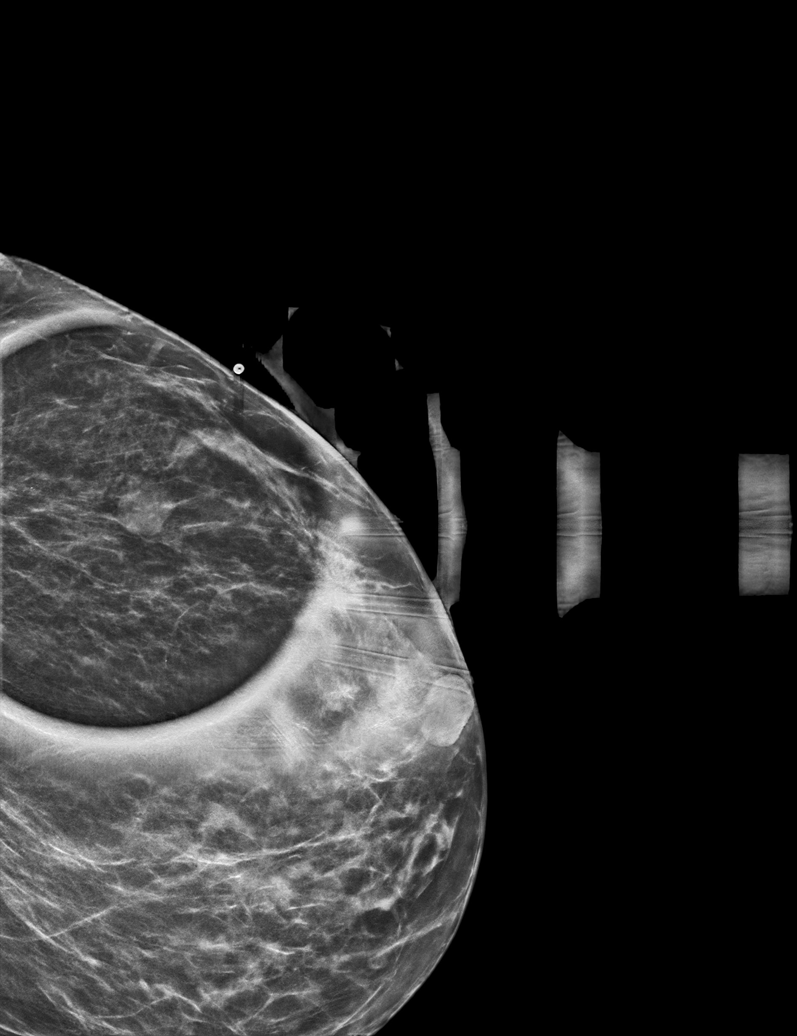

[R CC synth-2D]
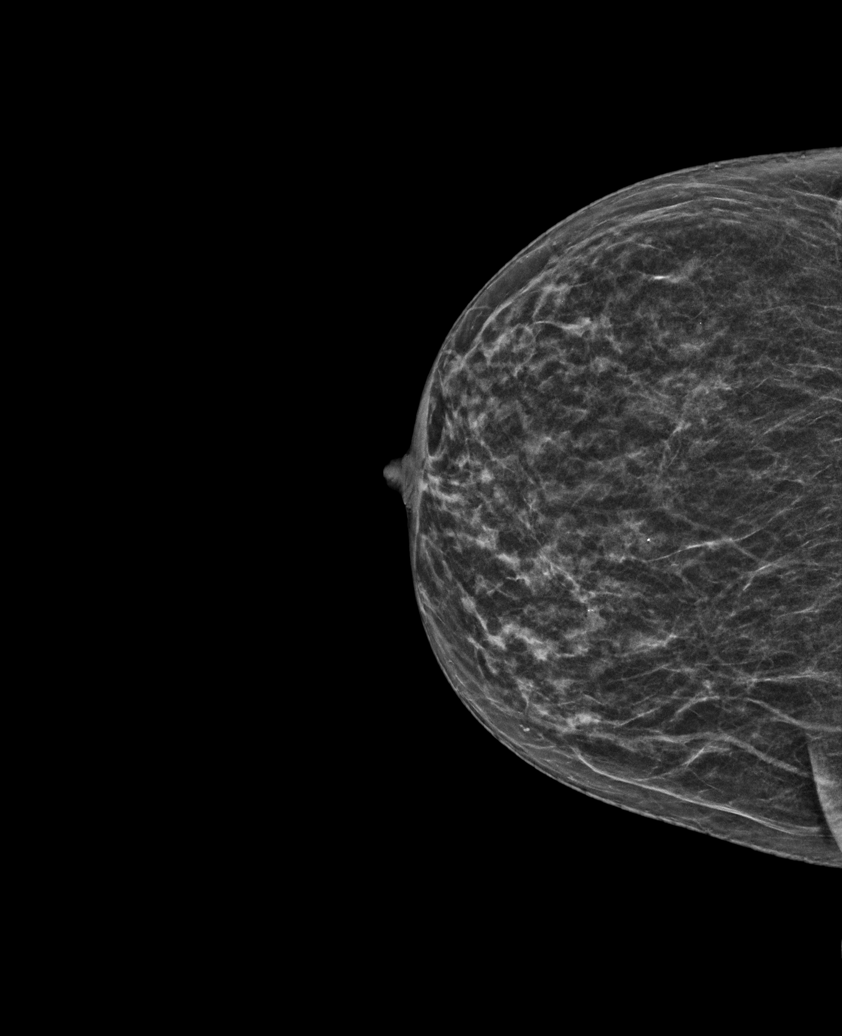

[R MLO synth-2D (2 of 2)]
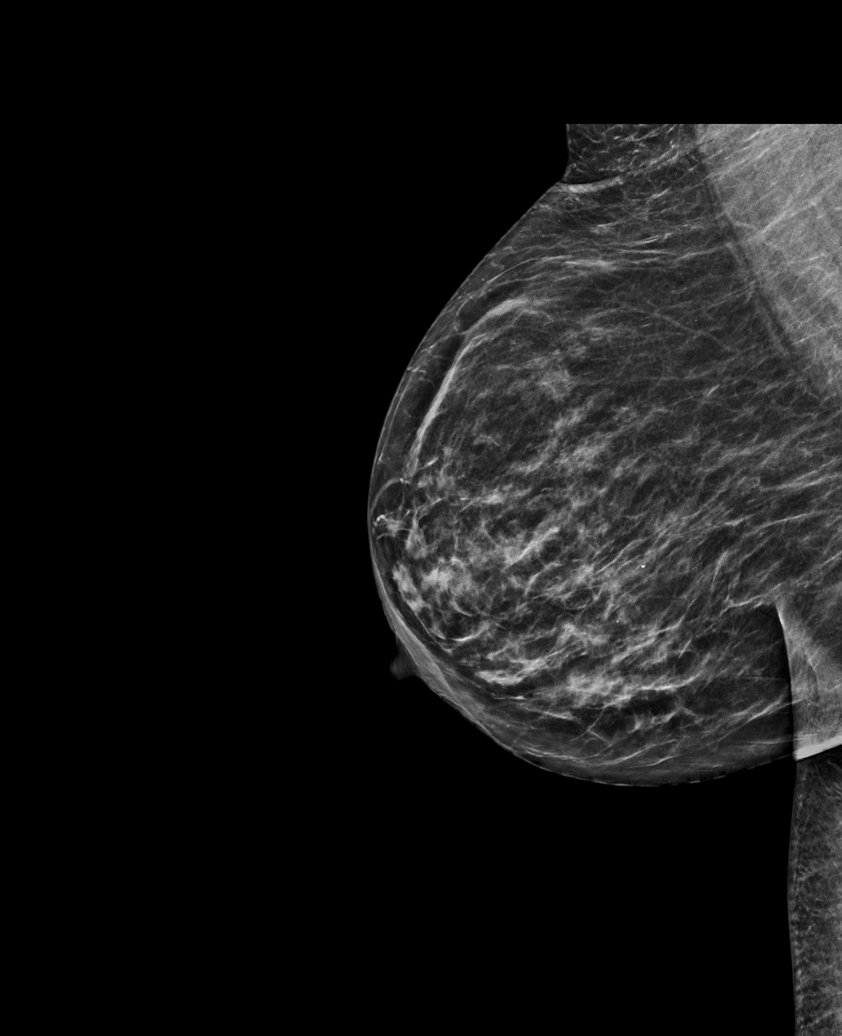

[L MLO synth-2D]
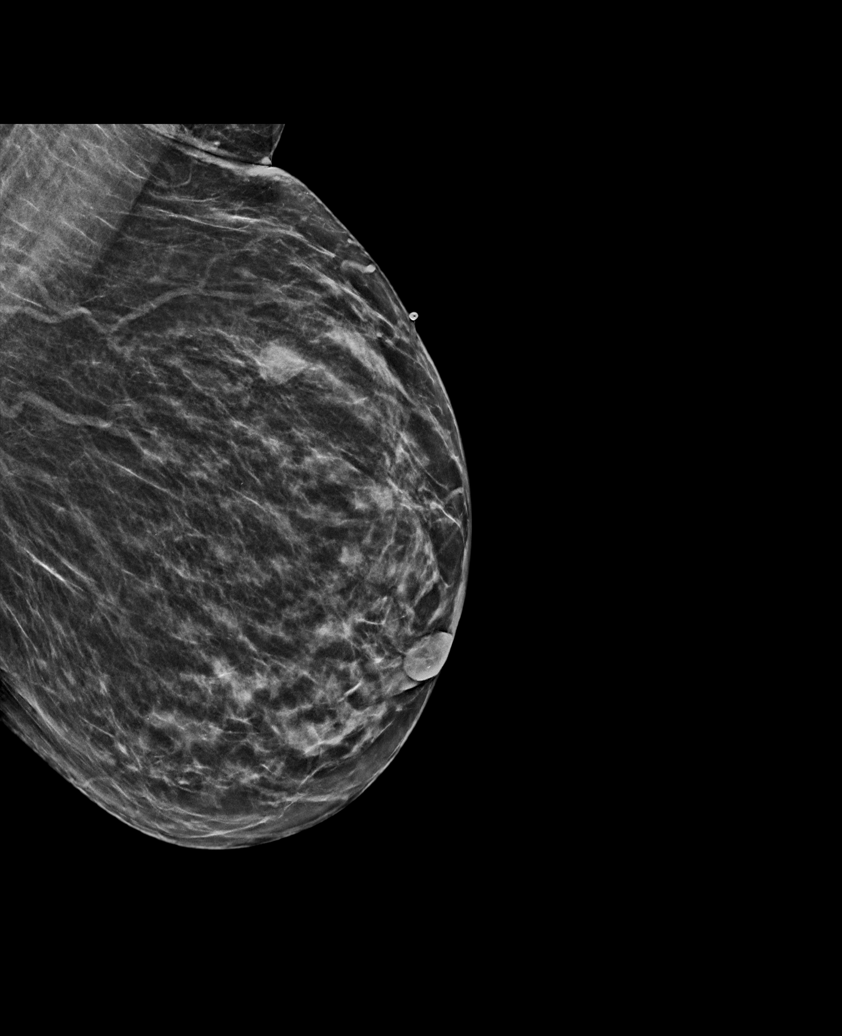

[L CC synth-2D]
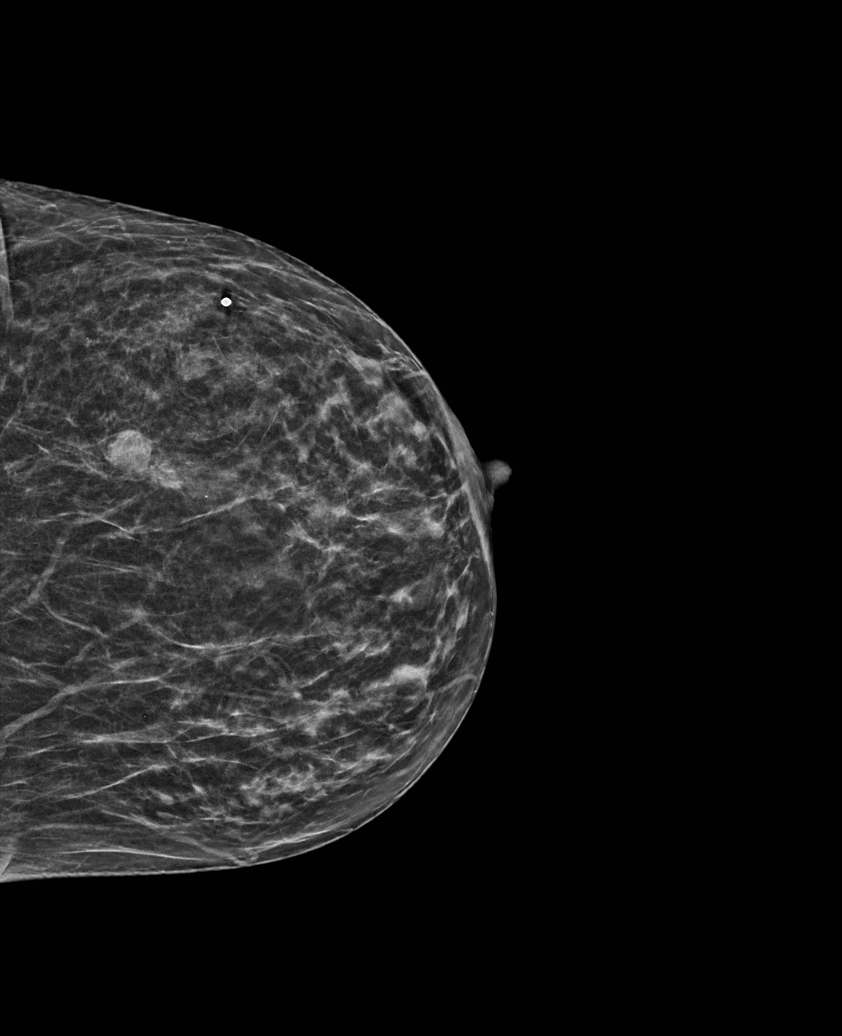

[6 of 36 positions shown; findings below may reference images not displayed]

ACR Breast Density Category b: There are scattered areas of
fibroglandular density.
FINDINGS: A BB indicating the palpable site of concern has been placed on the
upper-outer quadrant of the left breast. Deep to the palpable
marker, there is a round mass with angular and indistinct margins
measuring approximately 1.3 cm. No other suspicious calcifications,
masses or areas of distortion are seen in the bilateral breasts.

Mammographic images were processed with CAD.

Physical exam of the palpable site in the upper-outer quadrant of
the left breast demonstrates a firm fairly superficial palpable
lump.

Ultrasound targeted to the left breast at 1 o'clock, 6 cm from the
nipple demonstrates a hypoechoic irregular mass with indistinct
margins measuring 1.4 x 1.0 x 1.1 cm. Approximately 1.2 cm away from
this mass is a smaller similar-appearing mass measuring 0.9 x 0.6 x
0.8 cm. Together, these 2 masses span approximately 3.3 cm.
Ultrasound of the left axilla demonstrates 1 lymph node with a focal
cortical bulge measuring 0.4 cm.
IMPRESSION: 1. There are 2 suspicious adjacent masses in the left breast at 1
o'clock. Together, these 2 masses span approximately 3.3 cm.

2. There is 1 indeterminate left axillary lymph node with a 0.4 cm
cortical bulge.

3.  No evidence of malignancy in the right breast.

RECOMMENDATION:
Ultrasound-guided biopsy is recommended for the 2 masses in the left
breast and for the left axillary lymph node. This has been scheduled

I have discussed the findings and recommendations with the patient.
If applicable, a reminder letter will be sent to the patient
regarding the next appointment.

BI-RADS CATEGORY  5: Highly suggestive of malignancy.

## 2019-09-13 IMAGING — US US BREAST*L* LIMITED INC AXILLA
1 series · 13 of 20 positions shown · non-contrast
Comparison: Previous exam(s).

CLINICAL DATA: 69-year-old female presenting for evaluation of a
palpable lump in the left breast identified about 1 week ago.

EXAM:
DIGITAL DIAGNOSTIC BILATERAL MAMMOGRAM WITH TOMO AND CAD; ULTRASOUND
LEFT BREAST LIMITED

[Series 1: us breast*left* limited inc axilla · 0.07mm/px · 13 of 20 slices shown]
[im 1/20]
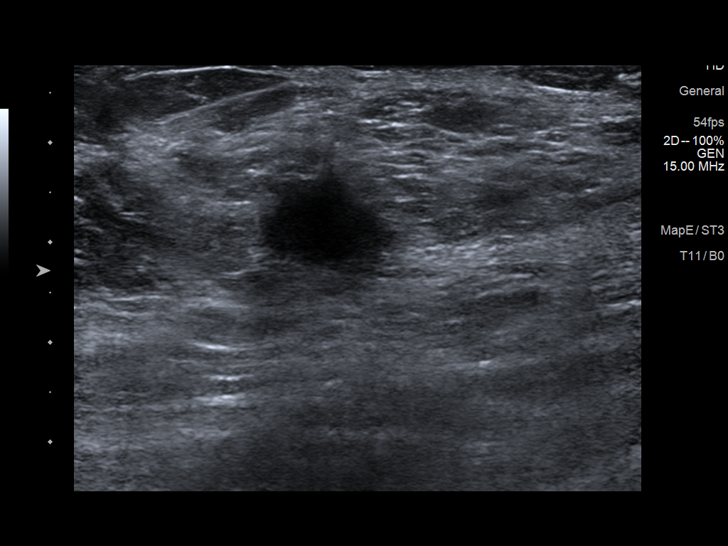
[im 3/20]
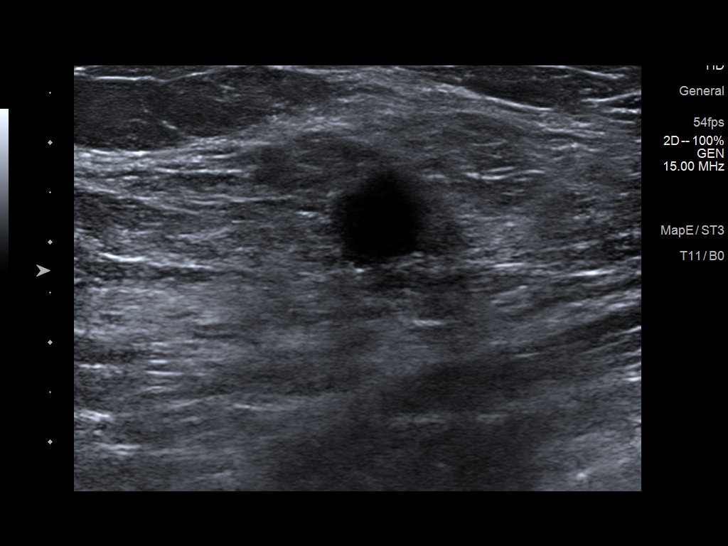
[im 4/20]
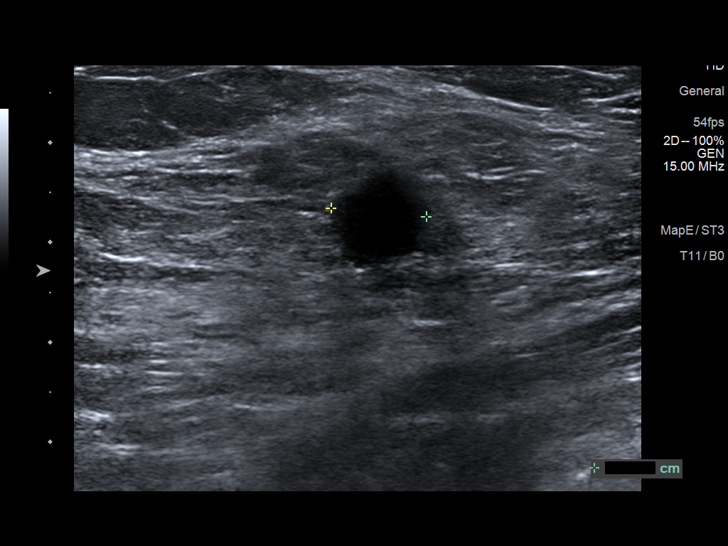
[im 6/20]
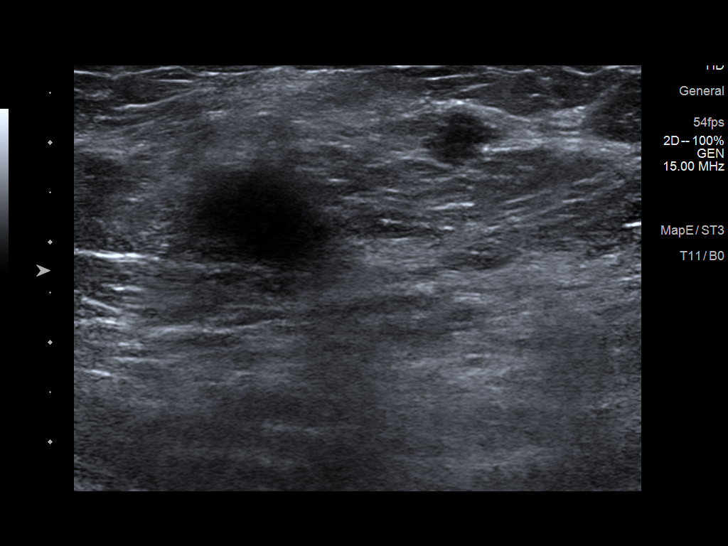
[im 7/20]
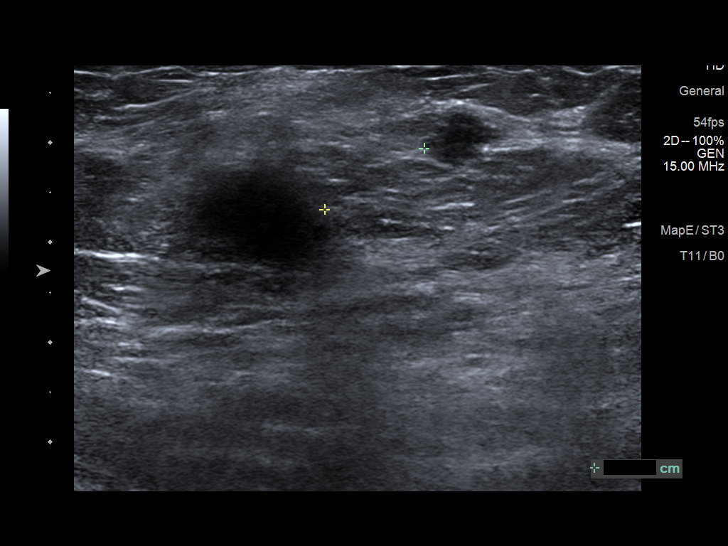
[im 9/20]
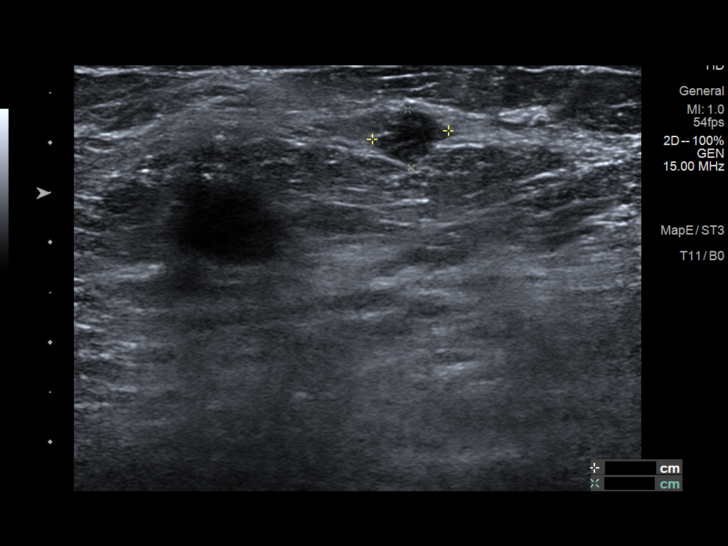
[im 11/20]
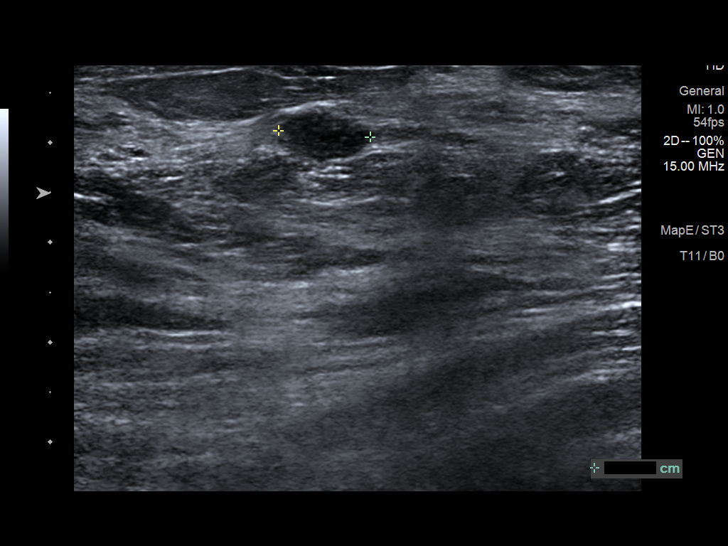
[im 12/20]
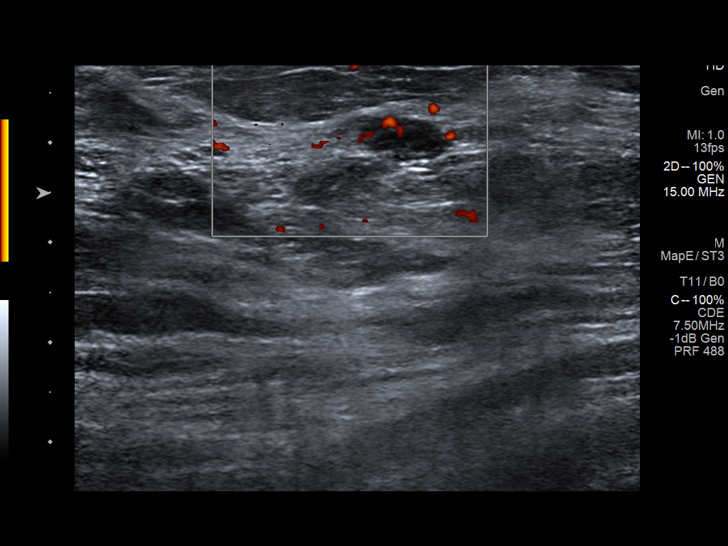
[im 14/20]
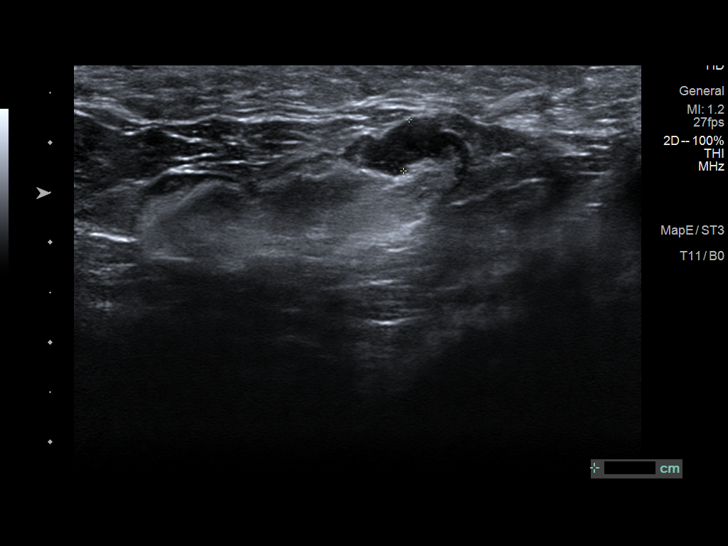
[im 15/20]
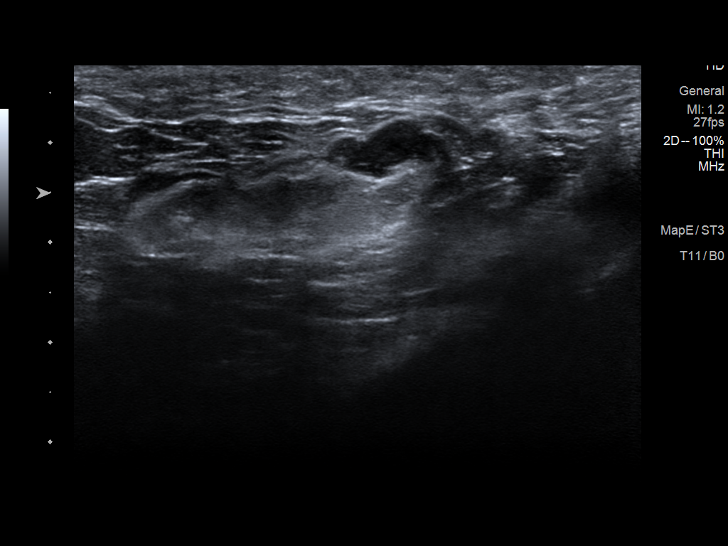
[im 17/20]
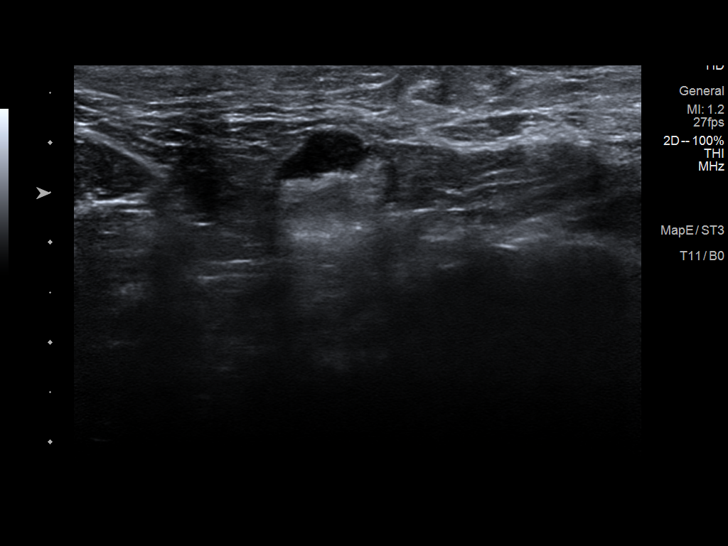
[im 18/20]
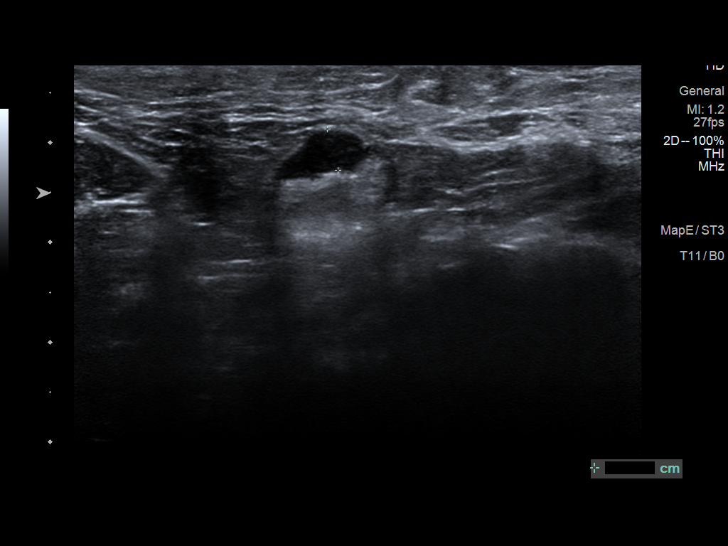
[im 20/20]
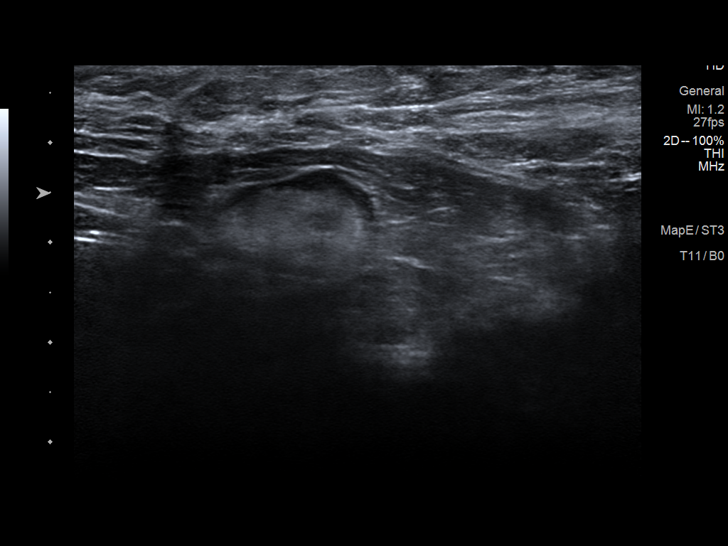

[13 of 20 positions shown; findings below may reference images not displayed]

ACR Breast Density Category b: There are scattered areas of
fibroglandular density.
FINDINGS: A BB indicating the palpable site of concern has been placed on the
upper-outer quadrant of the left breast. Deep to the palpable
marker, there is a round mass with angular and indistinct margins
measuring approximately 1.3 cm. No other suspicious calcifications,
masses or areas of distortion are seen in the bilateral breasts.

Mammographic images were processed with CAD.

Physical exam of the palpable site in the upper-outer quadrant of
the left breast demonstrates a firm fairly superficial palpable
lump.

Ultrasound targeted to the left breast at 1 o'clock, 6 cm from the
nipple demonstrates a hypoechoic irregular mass with indistinct
margins measuring 1.4 x 1.0 x 1.1 cm. Approximately 1.2 cm away from
this mass is a smaller similar-appearing mass measuring 0.9 x 0.6 x
0.8 cm. Together, these 2 masses span approximately 3.3 cm.
Ultrasound of the left axilla demonstrates 1 lymph node with a focal
cortical bulge measuring 0.4 cm.
IMPRESSION: 1. There are 2 suspicious adjacent masses in the left breast at 1
o'clock. Together, these 2 masses span approximately 3.3 cm.

2. There is 1 indeterminate left axillary lymph node with a 0.4 cm
cortical bulge.

3.  No evidence of malignancy in the right breast.

RECOMMENDATION:
Ultrasound-guided biopsy is recommended for the 2 masses in the left
breast and for the left axillary lymph node. This has been scheduled

I have discussed the findings and recommendations with the patient.
If applicable, a reminder letter will be sent to the patient
regarding the next appointment.

BI-RADS CATEGORY  5: Highly suggestive of malignancy.

## 2019-09-27 ENCOUNTER — Ambulatory Visit
Admission: RE | Admit: 2019-09-27 | Discharge: 2019-09-27 | Disposition: A | Discharge status: Home / Self Care | Payer: Medicare HMO | Source: Ambulatory Visit | Attending: Physician Assistant | Admitting: Physician Assistant

## 2019-09-27 ENCOUNTER — Other Ambulatory Visit: Payer: Self-pay

## 2019-09-27 DIAGNOSIS — N63 Unspecified lump in unspecified breast: Secondary | ICD-10-CM

## 2019-09-27 DIAGNOSIS — N6321 Unspecified lump in the left breast, upper outer quadrant: Secondary | ICD-10-CM | POA: Diagnosis not present

## 2019-09-27 DIAGNOSIS — R599 Enlarged lymph nodes, unspecified: Secondary | ICD-10-CM

## 2019-09-27 DIAGNOSIS — Z171 Estrogen receptor negative status [ER-]: Secondary | ICD-10-CM | POA: Diagnosis not present

## 2019-09-27 DIAGNOSIS — C50412 Malignant neoplasm of upper-outer quadrant of left female breast: Secondary | ICD-10-CM | POA: Diagnosis not present

## 2019-09-27 DIAGNOSIS — R590 Localized enlarged lymph nodes: Secondary | ICD-10-CM | POA: Diagnosis not present

## 2019-09-27 IMAGING — US US BREAST BX W LOC DEV 1ST LESION IMG BX SPEC US GUIDE*L*
1 series · 15 of 25 positions shown · non-contrast
Comparison: Previous exam(s).
COMPARISON: Previous exam(s).

Addendum:
CLINICAL DATA: 69-year-old female for tissue sampling of 1.4 cm
UPPER-OUTER LEFT breast mass, 0.9 cm UPPER-OUTER LEFT breast mass
and LEFT axillary lymph node with thickened cortex.

EXAM:
ULTRASOUND GUIDED LEFT BREAST CORE NEEDLE BIOPSY X 2
US AXILLARY NODE CORE BIOPSY LEFT

[Series 1: us breast bx w loc dev 1st lesion img bx spec us g · 0.07mm/px · 15 of 28 slices shown]
[im 1/28]
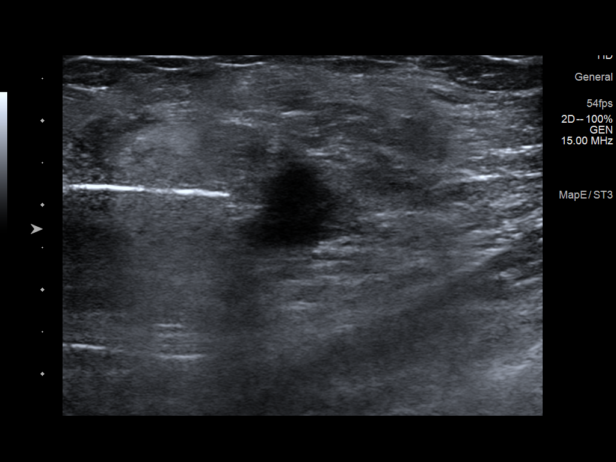
[im 3/28]
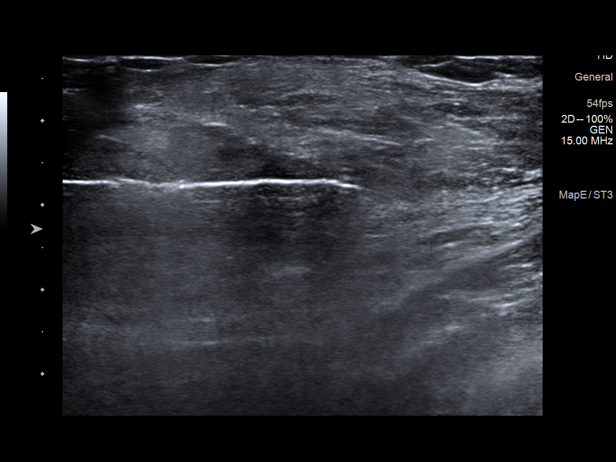
[im 5/28]
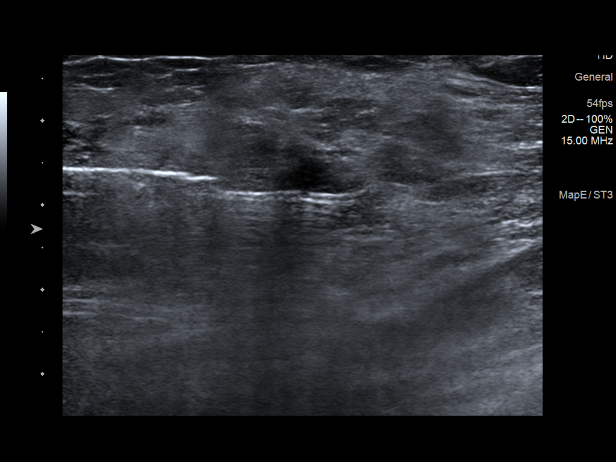
[im 6/28]
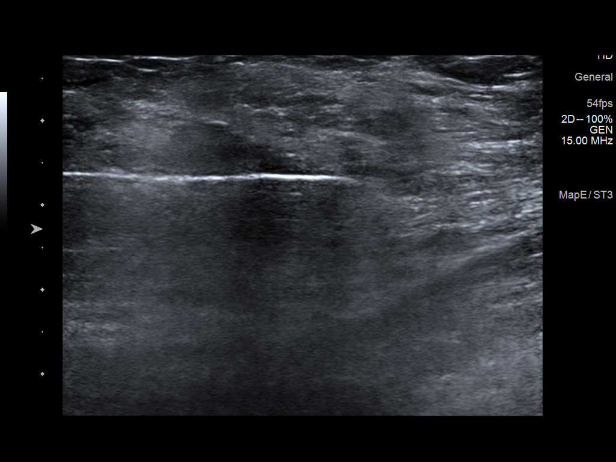
[im 8/28]
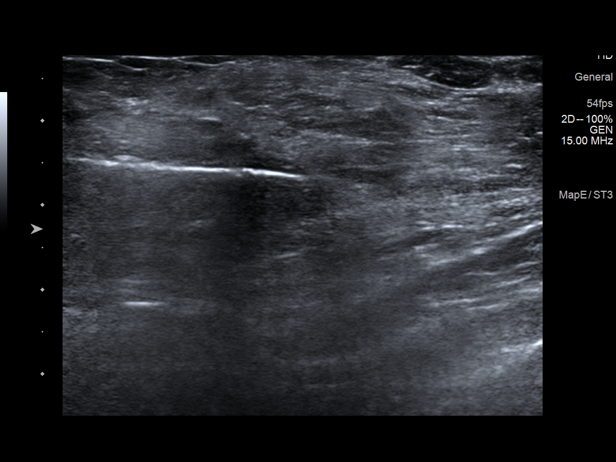
[im 11/28]
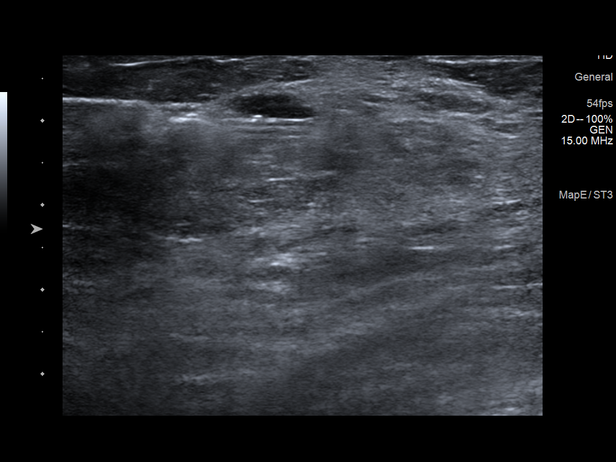
[im 12/28]
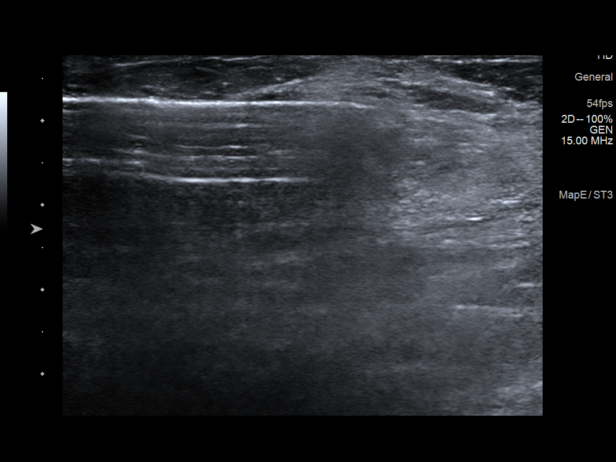
[im 14/28]
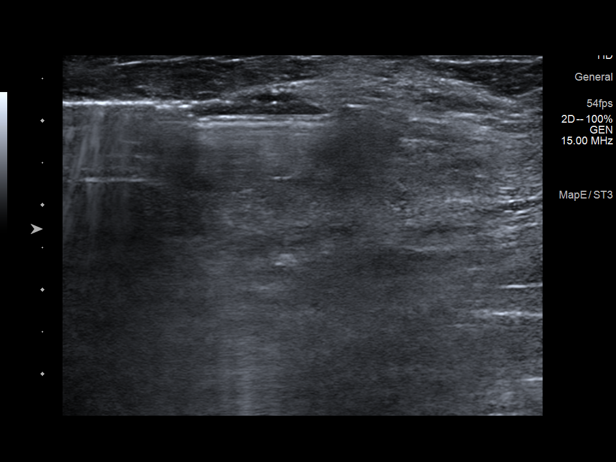
[im 16/28]
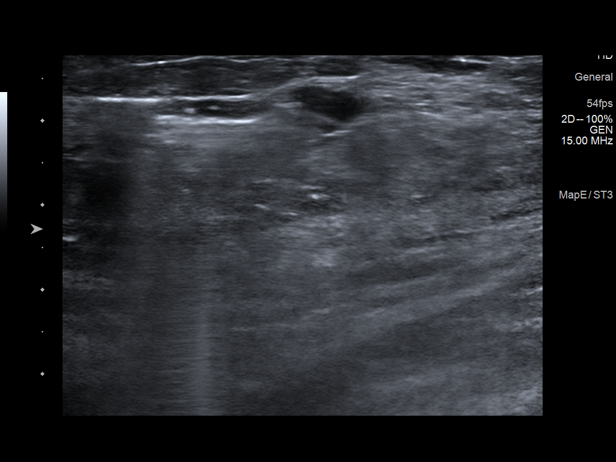
[im 17/28]
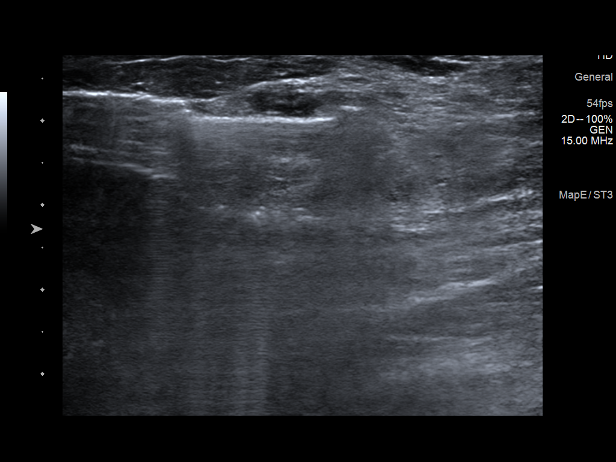
[im 20/28]
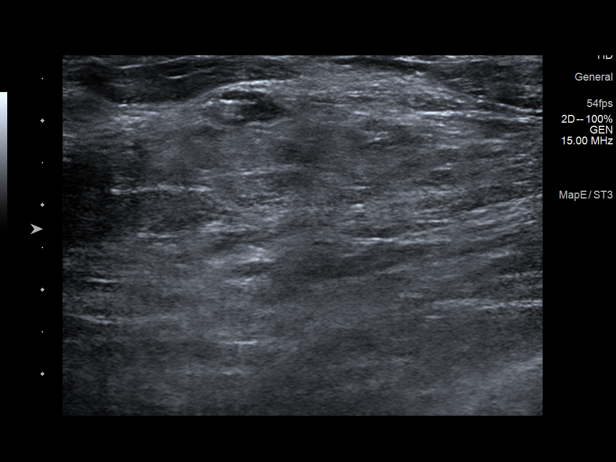
[im 22/28]
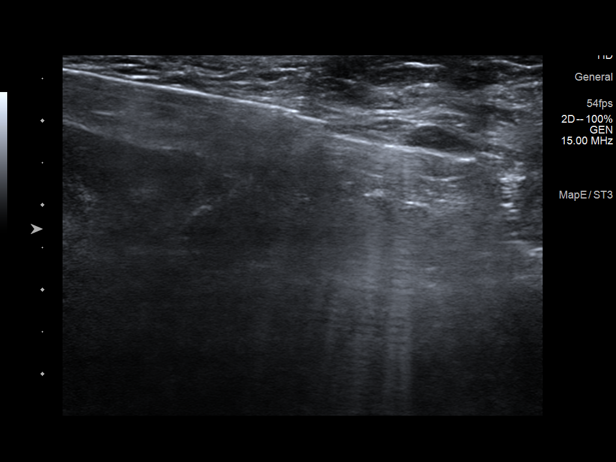
[im 23/28]
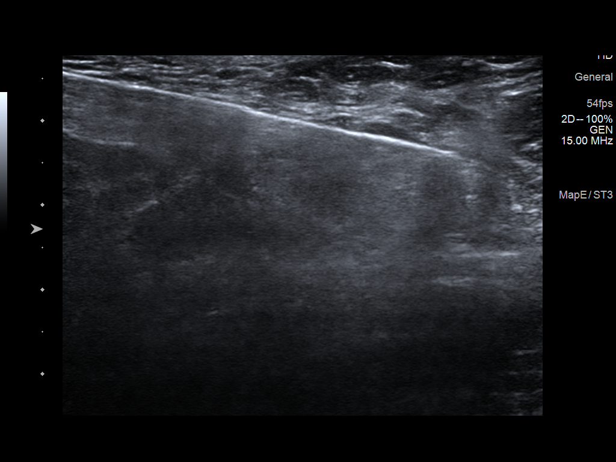
[im 25/28]
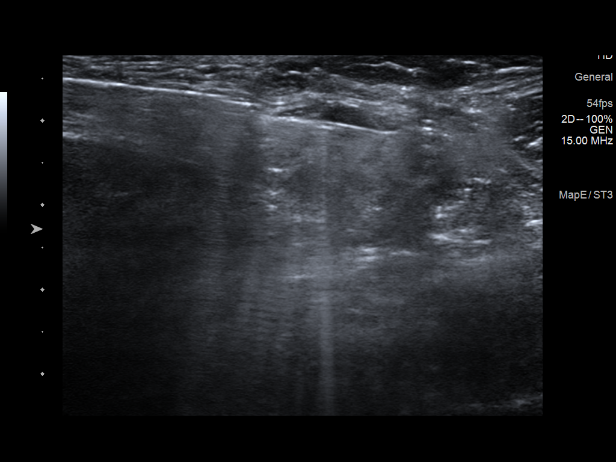
[im 28/28]
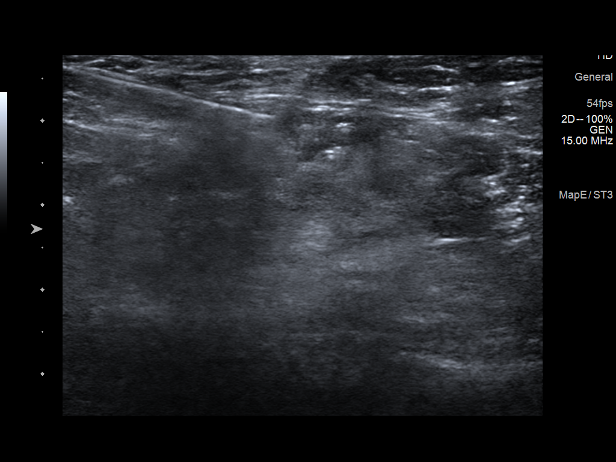

[15 of 25 positions shown; findings below may reference images not displayed]



ULTRASOUND GUIDED LEFT BREAST CORE NEEDLE BIOPSY #1 (1.4 cm mass at
the 1 o'clock position-RIBBON clip):

Lesion quadrant: UPPER-OUTER LEFT breast

Using sterile technique and 1% Lidocaine as local anesthetic, under
direct ultrasound visualization, a 12 gauge IKUE device was
used to perform biopsy of the 1.4 cm mass at the 1 o'clock position
6 cm from the nipple using a MEDIAL approach. At the conclusion of
the procedure a RIBBON tissue marker clip was deployed into the
biopsy cavity. Follow up 2 view mammogram was performed and dictated
separately.

ULTRASOUND-GUIDED LEFT BREAST CORE NEEDLE BIOPSY #2 (0.9 cm mass at
the 1:30 o'clock position-COIL clip):

Lesion quadrant: UPPER-OUTER LEFT breast

Using sterile technique and 1% Lidocaine as local anesthetic, under
direct ultrasound visualization, a 12 gauge IKUE device was
used to perform biopsy of the 0.9 cm mass at the [DATE] position 6 cm
from the nipple using a MEDIAL approach. At the conclusion of the
procedure a COIL tissue marker clip was deployed into the biopsy
cavity. Follow up 2 view mammogram was performed and dictated
separately.

ULTRASOUND GUIDED BIOPSY OF A LEFT AXILLARY LYMPH NODE (Q CLIP):

Using sterile technique and 1% Lidocaine as local anesthetic, under
direct ultrasound visualization, a 14 gauge IKUE device was
used to perform biopsy of the LEFT axillary lymph node with focal
cortical thickening using a MEDIAL approach. At the conclusion of
the procedure a Q tissue marker clip was deployed into the biopsy
cavity, with satisfactory placement confirmed sonographically.
Follow up 2 view mammogram was performed and dictated separately.
IMPRESSION: Ultrasound guided biopsy of a 1.4 cm UPPER-OUTER LEFT breast mass
(RIBBON clip), a 0.9 cm UPPER-OUTER LEFT breast mass (COIL clip),
and a LEFT axillary lymph node with focal cortical thickening (Q
clip). No apparent complications.

ADDENDUM:
Pathology revealed GRADE III INVASIVE DUCTAL CARCINOMA of the LEFT
breast, 1 o'clock (ribbon clip), upper outer. This was found to be
concordant by Dr. IKUE.

Pathology revealed GRADE III INVASIVE DUCTAL CARCINOMA of the LEFT
breast, 1:30 o'clock, (coil clip), upper outer. This was found to be
concordant by Dr. IKUE.

Pathology revealed LYMPH NODE TISSUE WITH NO METASTATIC CARCINOMA
IDENTIFIED of the LEFT axilla. This was found to be concordant by
Dr. IKUE.

Pathology results were discussed with the patient by telephone. The
patient reported doing well after the biopsies with tenderness at
the sites. Post biopsy instructions and care were reviewed and
questions were answered. The patient was encouraged to call The
direct phone number was provided.

The patient was referred to [REDACTED]
[REDACTED] at [REDACTED] on
[DATE].

Pathology results reported by IKUE, RN on [DATE].



ULTRASOUND GUIDED LEFT BREAST CORE NEEDLE BIOPSY #1 (1.4 cm mass at
the 1 o'clock position-RIBBON clip):

Lesion quadrant: UPPER-OUTER LEFT breast

Using sterile technique and 1% Lidocaine as local anesthetic, under
direct ultrasound visualization, a 12 gauge IKUE device was
used to perform biopsy of the 1.4 cm mass at the 1 o'clock position
6 cm from the nipple using a MEDIAL approach. At the conclusion of
the procedure a RIBBON tissue marker clip was deployed into the
biopsy cavity. Follow up 2 view mammogram was performed and dictated
separately.

ULTRASOUND-GUIDED LEFT BREAST CORE NEEDLE BIOPSY #2 (0.9 cm mass at
the 1:30 o'clock position-COIL clip):

Lesion quadrant: UPPER-OUTER LEFT breast

Using sterile technique and 1% Lidocaine as local anesthetic, under
direct ultrasound visualization, a 12 gauge IKUE device was
used to perform biopsy of the 0.9 cm mass at the [DATE] position 6 cm
from the nipple using a MEDIAL approach. At the conclusion of the
procedure a COIL tissue marker clip was deployed into the biopsy
cavity. Follow up 2 view mammogram was performed and dictated
separately.

ULTRASOUND GUIDED BIOPSY OF A LEFT AXILLARY LYMPH NODE (Q CLIP):

Using sterile technique and 1% Lidocaine as local anesthetic, under
direct ultrasound visualization, a 14 gauge IKUE device was
used to perform biopsy of the LEFT axillary lymph node with focal
cortical thickening using a MEDIAL approach. At the conclusion of
the procedure a Q tissue marker clip was deployed into the biopsy
cavity, with satisfactory placement confirmed sonographically.
Follow up 2 view mammogram was performed and dictated separately.
IMPRESSION: Ultrasound guided biopsy of a 1.4 cm UPPER-OUTER LEFT breast mass
(RIBBON clip), a 0.9 cm UPPER-OUTER LEFT breast mass (COIL clip),
and a LEFT axillary lymph node with focal cortical thickening (Q
clip). No apparent complications.

## 2019-09-27 IMAGING — MG MM BREAST LOCALIZATION CLIP
6 series · 6 of 18 positions shown · non-contrast
Comparison: Previous exam(s).

CLINICAL DATA: Evaluate RIBBON and COIL biopsy marker placement
following ultrasound-guided LEFT breast biopsies, and Q biopsy
marker placement following ultrasound-guided LEFT axillary biopsy.

EXAM:
DIAGNOSTIC LEFT MAMMOGRAM POST ULTRASOUND BIOPSY

[L ML synth-2D]
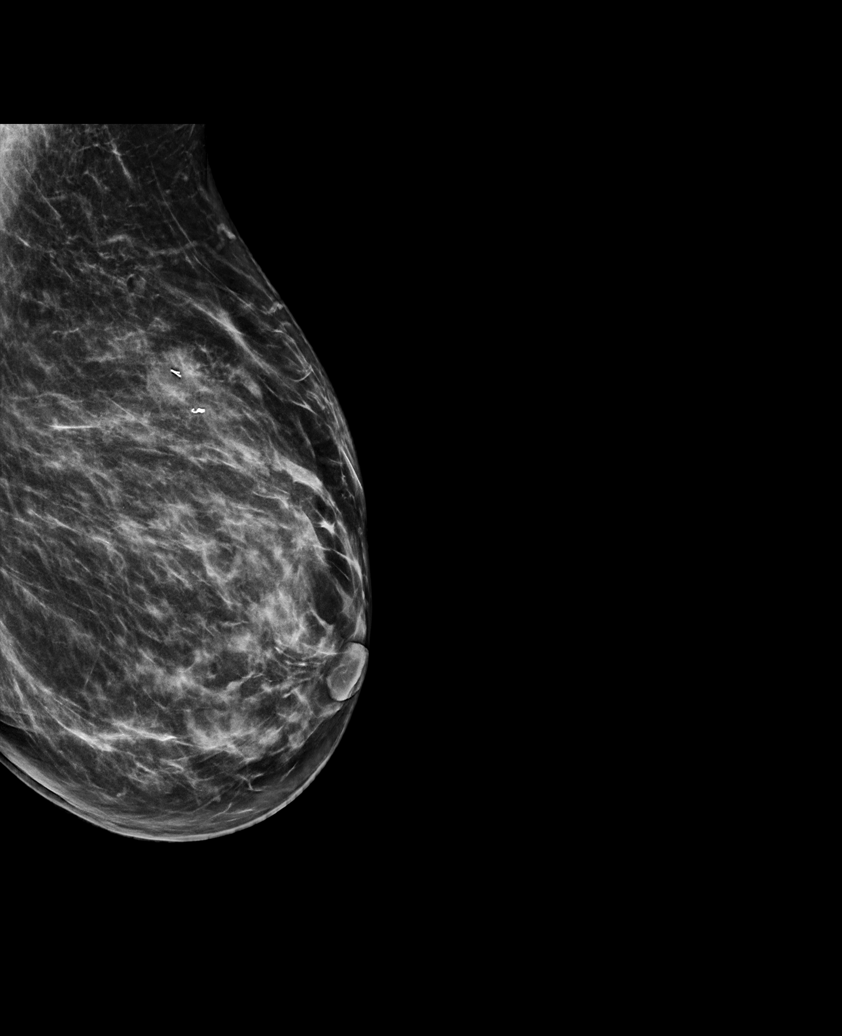

[L CC synth-2D]
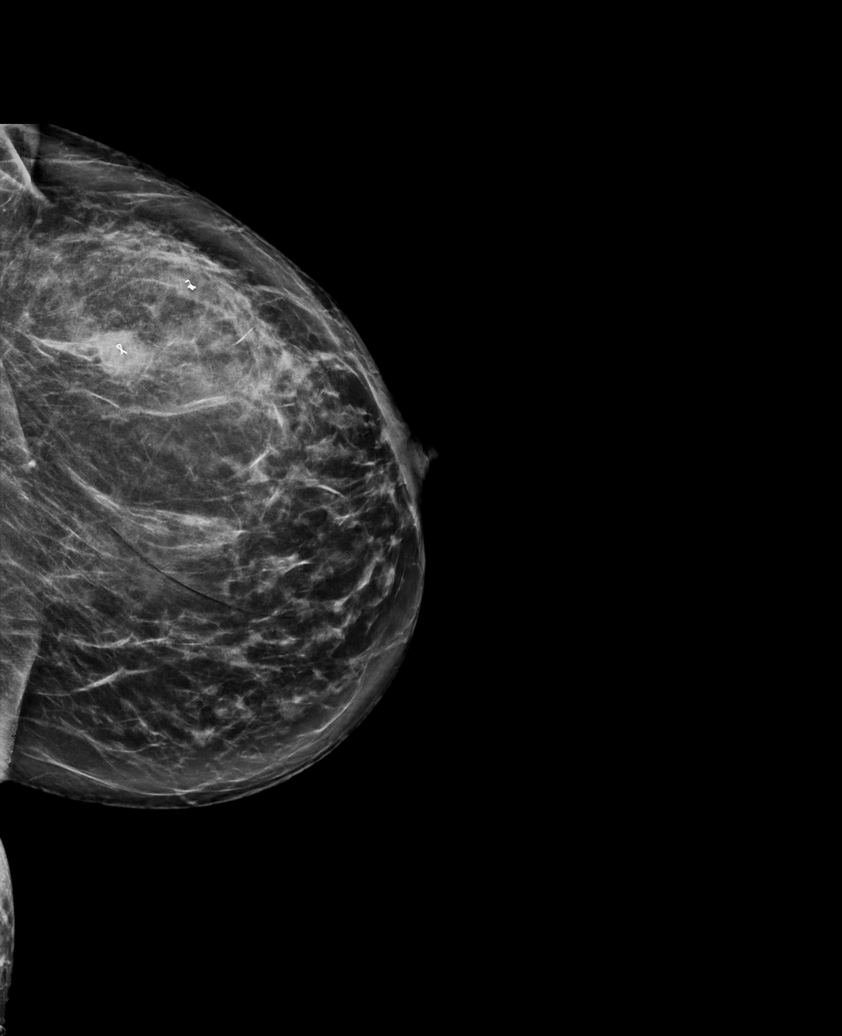

[L MLO synth-2D]
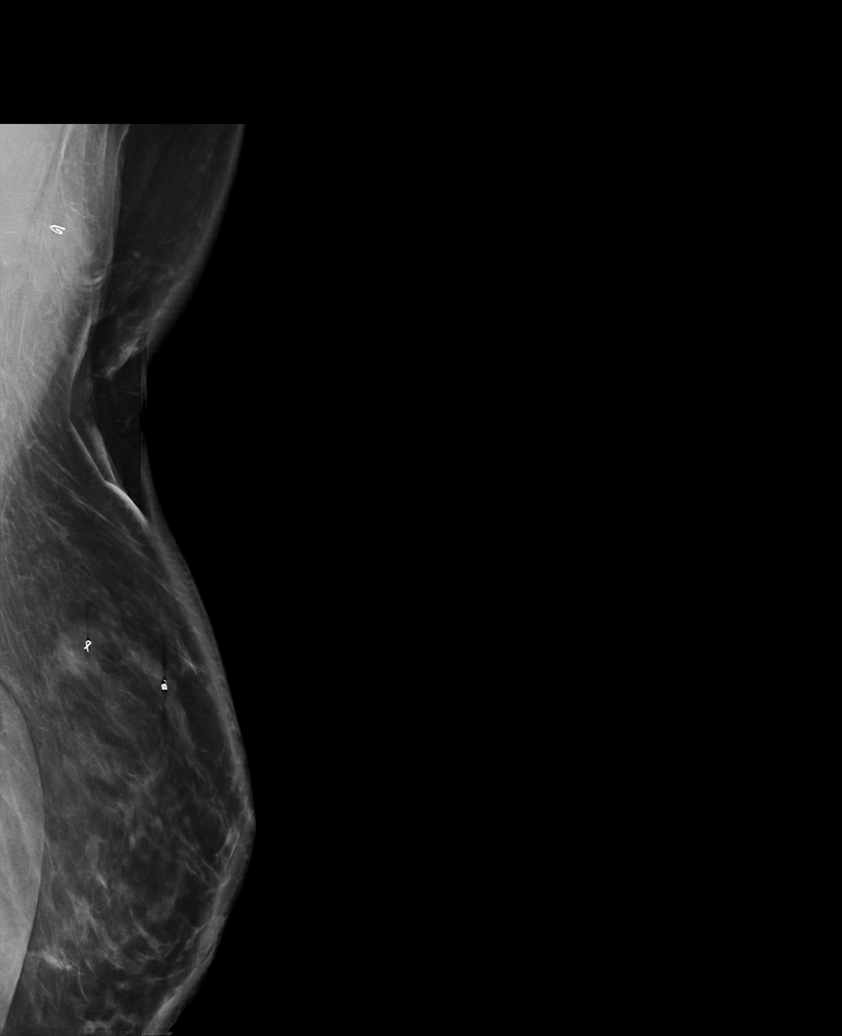

[L CC tomo · tomo slice 37/72.0]
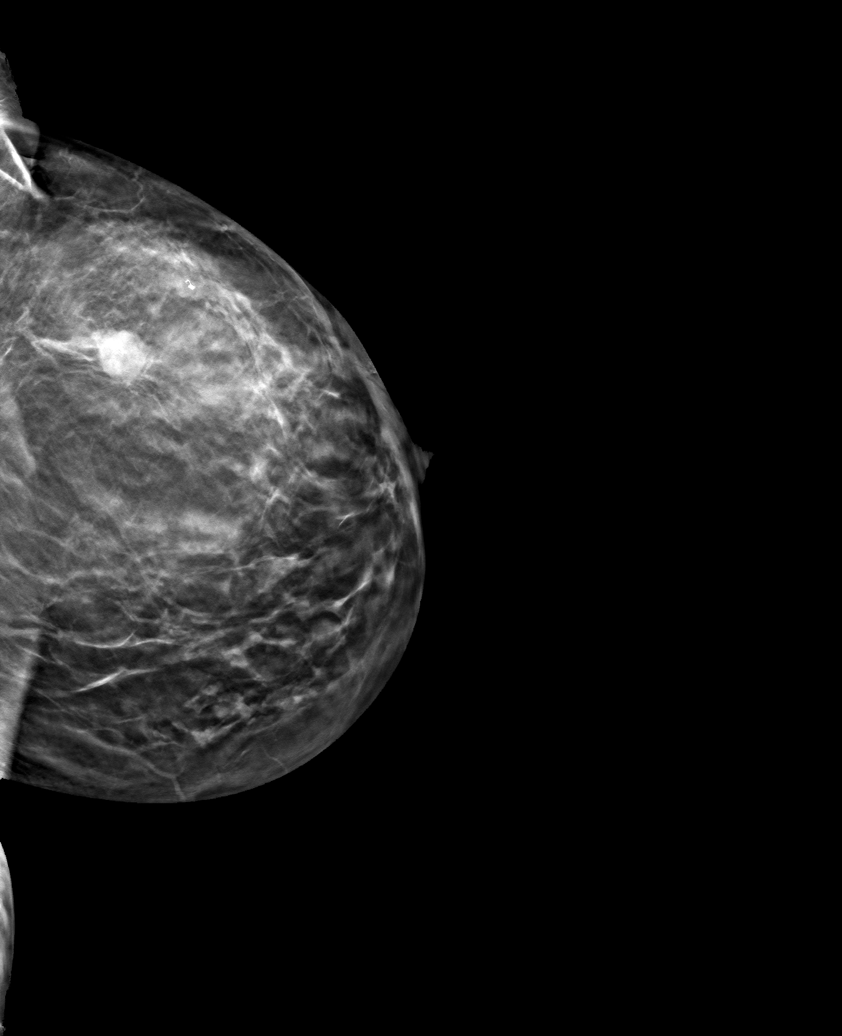

[L MLO tomo · tomo slice 58/115.0]
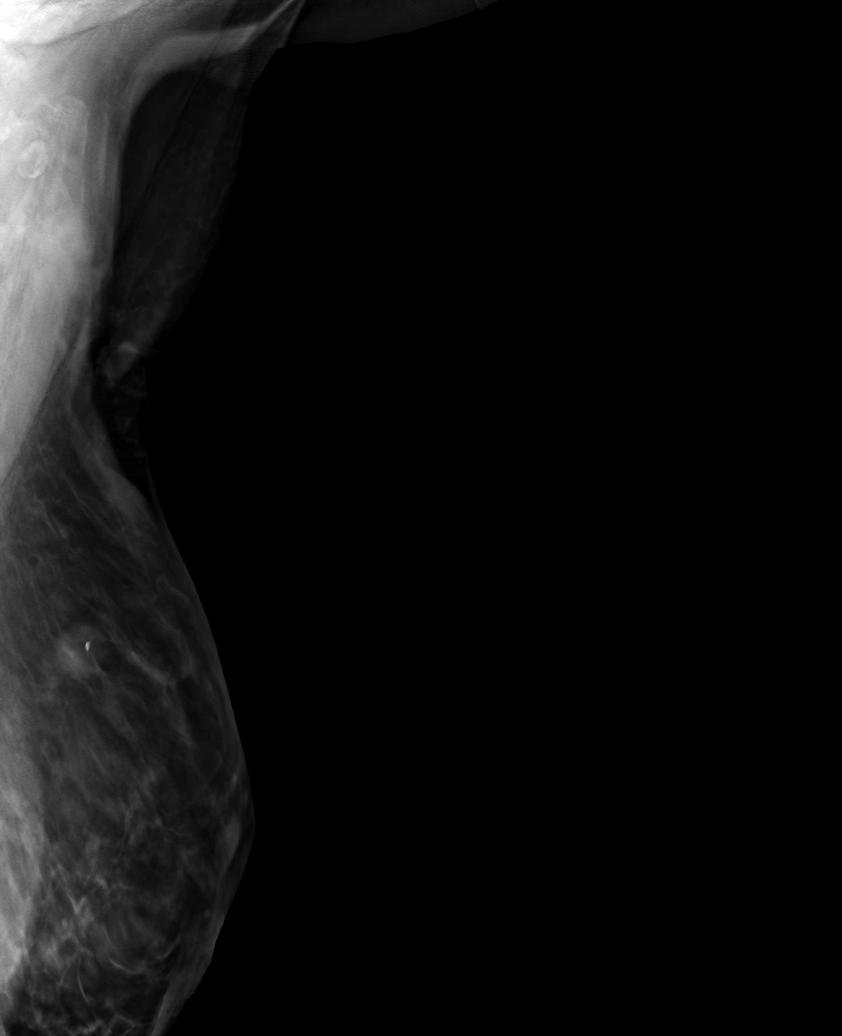

[L ML tomo · tomo slice 35/70.0]
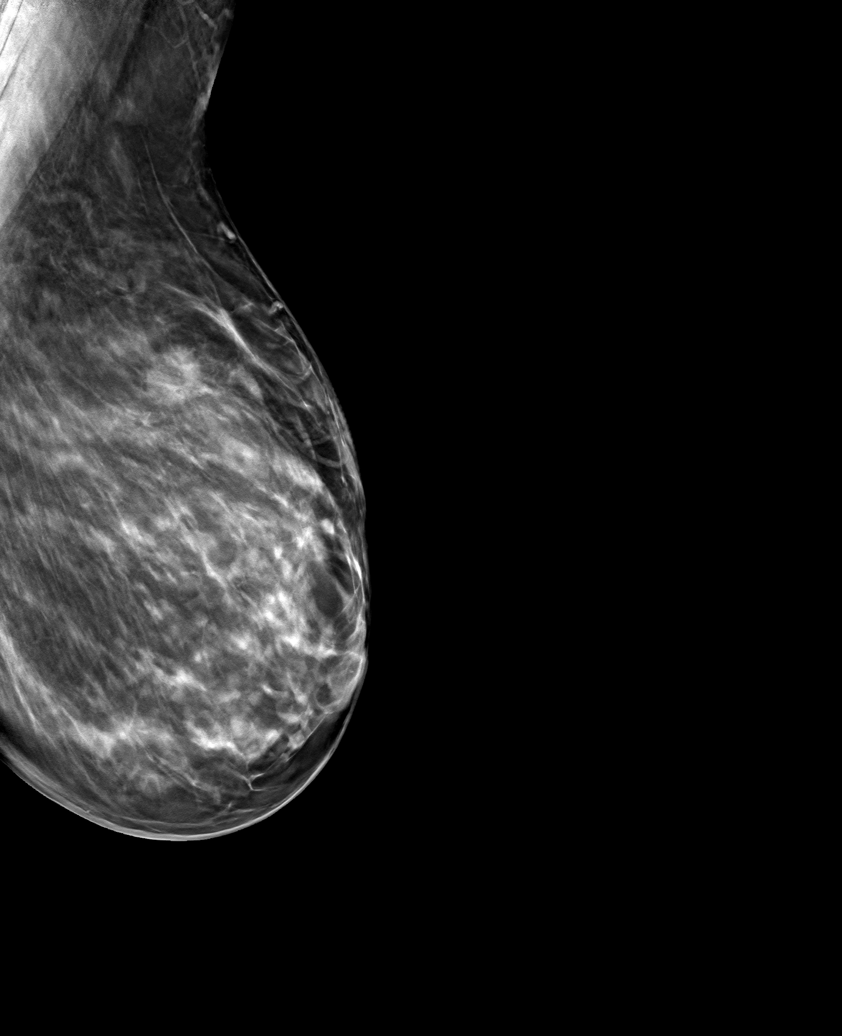

[6 of 18 positions shown; findings below may reference images not displayed]

FINDINGS: Mammographic images were obtained following ultrasound guided
biopsies of a 1.4 cm mass at the 1 o'clock position of the LEFT
breast, a 0.9 cm mass at the 1:30 o'clock position of the LEFT
breast and an abnormal LEFT axillary lymph node.

The RIBBON biopsy marking clip is in expected position at the site
of biopsy of the 1.4 cm UPPER-OUTER LEFT breast mass.

The COIL biopsy marking clip is in expected position at the site of
biopsy of the 0.9 cm UPPER-OUTER LEFT breast mass.

The RIBBON and COIL clips are separated by a distance of 2 cm.

The Q biopsy marking clip is in expected position at the site of
biopsy of the LEFT axillary lymph node.
IMPRESSION: Appropriate positioning of the RIBBON shaped biopsy marking clip at
the site of biopsy in the UPPER OUTER LEFT breast (1.4 cm 1 o'clock
position mass).

Appropriate positioning of the COIL shaped biopsy marking clip at
the site of biopsy in the UPPER OUTER LEFT breast (0.9 cm 1:30
o'clock position mass).

Appropriate positioning of the Q shaped biopsy marking clip at the
site of biopsy in the LEFT axilla.

Final Assessment: Post Procedure Mammograms for Marker Placement

## 2019-10-01 ENCOUNTER — Telehealth: Payer: Self-pay | Admitting: Oncology

## 2019-10-01 NOTE — Telephone Encounter (Signed)
Spoke to patient to confirm morning [facility] appointment for 9/29, packet will be emailed to patient at [email or web address], also explained that surgeons office will be calling as well

## 2019-10-03 ENCOUNTER — Encounter: Payer: Self-pay | Admitting: *Deleted

## 2019-10-03 DIAGNOSIS — C50412 Malignant neoplasm of upper-outer quadrant of left female breast: Secondary | ICD-10-CM

## 2019-10-03 DIAGNOSIS — Z171 Estrogen receptor negative status [ER-]: Secondary | ICD-10-CM

## 2019-10-07 NOTE — Progress Notes (Signed)
Radiation Oncology         ([phone]) [phone] ________________________________  Multidisciplinary Breast Oncology Clinic [facility]) Initial Outpatient Consultation  Name: Cheyenne Mcdonald MRN: [id number]  Date: 10/09/2019  DOB: [birth year]  [id number], Iona Beard, MD  [name], MD   REFERRING PHYSICIAN: [name], MD  DIAGNOSIS: The encounter diagnosis was Malignant neoplasm of upper-outer quadrant of left breast in female, estrogen receptor negative ([place]).  Stage T1c, N1, Mx, Left Breast UOQ, Invasive Ductal Carcinoma, ER- / PR- / Her2-, Grade 3    ICD-10-CM   1. Malignant neoplasm of upper-outer quadrant of left breast in female, estrogen receptor negative ([place])  C50.412    Z17.1     HISTORY OF PRESENT ILLNESS::Cheyenne Mcdonald is a 69 y.o. female who is presenting to the office today for evaluation of her newly diagnosed breast cancer. She is accompanied by her husband. She is doing well overall.   She underwent bilateral diagnostic mammography with tomography and left breast ultrasonography at [facility] on 09/13/2019 for evaluation of palpable left breast mass. Results showed two suspicious adjacent mass in the left breat at the 1 o'clock position that spanned approximately 3.3 cm. It also showed one indeterminate left axillary lymph node with a 0.4 cm cortical bulge. There was no evidence of malignancy in the right breast.  Biopsy on 09/27/2019 revealed grade 3 invasive ductal carcinoma of the left breast. Needle core biopsy of left breast lymph node was negative for metastatic carcinoma. Prognostic indicators were significant for estrogen receptor, 0% negative and progesterone receptor, 0% negative. Proliferation marker Ki67 at 90%. HER2 negative.  Both areas in the breast were biopsied showing invasive ductal carcinoma.  Menarche: 69 years old Age at first live birth: 69 years old GP: 2 LMP: 48 Contraceptive: Birth control pills for approximately 20 years HRT: Never    The patient was referred today for presentation in the multidisciplinary conference.  Radiology studies and pathology slides were presented there for review and discussion of treatment options.  A consensus was discussed regarding potential next steps.  PREVIOUS RADIATION THERAPY: No  PAST MEDICAL HISTORY:  Past Medical History:  Diagnosis Date  . Breast cancer ([place])   . Obesity   . Sleep apnea     PAST SURGICAL HISTORY: Past Surgical History:  Procedure Laterality Date  . ABDOMINAL HYSTERECTOMY      FAMILY HISTORY: No family history on file.  SOCIAL HISTORY:  Social History   Socioeconomic History  . Marital status: Married    Spouse name: Not on file  . Number of children: Not on file  . Years of education: Not on file  . Highest education level: Not on file  Occupational History  . Not on file  Tobacco Use  . Smoking status: Never Smoker  . Smokeless tobacco: Never Used  Substance and Sexual Activity  . Alcohol use: Never  . Drug use: Never  . Sexual activity: Not on file  Other Topics Concern  . Not on file  Social History Narrative  . Not on file   Social Determinants of Health   Financial Resource Strain:   . Difficulty of Paying Living Expenses: Not on file  Food Insecurity:   . Worried About Charity fundraiser in the Last Year: Not on file  . Ran Out of Food in the Last Year: Not on file  Transportation Needs:   . Lack of Transportation (Medical): Not on file  . Lack of Transportation (Non-Medical): Not on file  Physical Activity:   . Days of Exercise per Week: Not on file  . Minutes of Exercise per Session: Not on file  Stress:   . Feeling of Stress : Not on file  Social Connections:   . Frequency of Communication with Friends and Family: Not on file  . Frequency of Social Gatherings with Friends and Family: Not on file  . Attends Religious Services: Not on file  . Active Member of Clubs or Organizations: Not on file  . Attends Tax inspector Meetings: Not on file  . Marital Status: Not on file    ALLERGIES: Not on File  MEDICATIONS:  Current Outpatient Medications  Medication Sig Dispense Refill  . dexamethasone (DECADRON) 4 MG tablet Take 2 tablets (8 mg) by mouth twice daily days 2 and 3 after chemo, then take one last dose the morning of day 4; take with food 30 tablet 1  . diclofenac sodium (VOLTAREN) 1 % GEL Apply 1 application topically 4 (four) times daily.      Marland Kitchen lidocaine-prilocaine (EMLA) cream Apply to affected area once 30 g 3  . loratadine (CLARITIN) 10 MG tablet Take 1 tablet (10 mg total) by mouth daily. 40 tablet 0  . LORazepam (ATIVAN) 0.5 MG tablet Take 1 tablet (0.5 mg total) by mouth at bedtime as needed (Nausea or vomiting). 20 tablet 0  . meloxicam (MOBIC) 15 MG tablet Take 15 mg by mouth daily. With food     . prochlorperazine (COMPAZINE) 10 MG tablet Take 1 tablet (10 mg total) by mouth every 6 (six) hours as needed (Nausea or vomiting). 30 tablet 1   No current facility-administered medications for this encounter.    REVIEW OF SYSTEMS: A 10+ POINT REVIEW OF SYSTEMS WAS OBTAINED including neurology, dermatology, psychiatry, cardiac, respiratory, lymph, extremities, GI, GU, musculoskeletal, constitutional, reproductive, HEENT. On the provided form, she reports weight change, heart murmur, lump in left breast, and arthritis in right knee and hands. She denies shortness of breath, chest pain, cough, abdominal pain, nausea, vomiting, diarrhea, rash, and any other symptoms.  She denies any pain within the breast area nipple discharge or bleeding but as above the palpated area of concern on self exam.   PHYSICAL EXAM:   Vitals with BMI 10/09/2019  Height 5' 3.5"  Weight 183 lbs 13 oz  BMI 32.04  Systolic 130  Diastolic 85  Pulse 63   Lungs are clear to auscultation bilaterally. Heart has regular rate and rhythm. No palpable cervical, supraclavicular, or axillary adenopathy. Abdomen soft,  non-tender, normal bowel sounds. Right breast with no palpable mass, nipple discharge, or bleeding.  Left breast with a palpable area of induration that measured approximately 3-4 cm in the approximate 1 o'clock position. There was some bruising and biopsy changes noted.  KPS = 90  100 - Normal; no complaints; no evidence of disease. 90   - Able to carry on normal activity; minor signs or symptoms of disease. 80   - Normal activity with effort; some signs or symptoms of disease. 46   - Cares for self; unable to carry on normal activity or to do active work. 60   - Requires occasional assistance, but is able to care for most of his personal needs. 50   - Requires considerable assistance and frequent medical care. 40   - Disabled; requires special care and assistance. 30   - Severely disabled; hospital admission is indicated although death not imminent. 20   - Very sick; hospital admission  necessary; active supportive treatment necessary. 10   - Moribund; fatal processes progressing rapidly. 0     - Dead  Karnofsky DA, Abelmann [place], Craver LS and Burchenal [facility] [phone]) The use of the nitrogen mustards in the palliative treatment of carcinoma: with particular reference to bronchogenic carcinoma Cancer 1 [id number]  LABORATORY DATA:  Lab Results  Component Value Date   WBC 5.2 10/09/2019   HGB 13.0 10/09/2019   HCT 39.8 10/09/2019   MCV 93.9 10/09/2019   PLT 180 10/09/2019   Lab Results  Component Value Date   NA 140 10/09/2019   K 3.9 10/09/2019   CL 107 10/09/2019   CO2 25 10/09/2019   Lab Results  Component Value Date   ALT 17 10/09/2019   AST 18 10/09/2019   ALKPHOS 77 10/09/2019   BILITOT 0.3 10/09/2019    PULMONARY FUNCTION TEST:   Recent Review Flowsheet Data   There is no flowsheet data to display.     RADIOGRAPHY: US BREAST LTD UNI LEFT INC AXILLA  Result Date: 09/13/2019 CLINICAL DATA:  69 year old female presenting for evaluation of a palpable lump in the left breast  identified about 1 week ago. EXAM: DIGITAL DIAGNOSTIC BILATERAL MAMMOGRAM WITH TOMO AND CAD; ULTRASOUND LEFT BREAST LIMITED COMPARISON:  Previous exam(s). ACR Breast Density Category b: There are scattered areas of fibroglandular density. FINDINGS: A BB indicating the palpable site of concern has been placed on the upper-outer quadrant of the left breast. Deep to the palpable marker, there is a round mass with angular and indistinct margins measuring approximately 1.3 cm. No other suspicious calcifications, masses or areas of distortion are seen in the bilateral breasts. Mammographic images were processed with CAD. Physical exam of the palpable site in the upper-outer quadrant of the left breast demonstrates a firm fairly superficial palpable lump. Ultrasound targeted to the left breast at 1 o'clock, 6 cm from the nipple demonstrates a hypoechoic irregular mass with indistinct margins measuring 1.4 x 1.0 x 1.1 cm. Approximately 1.2 cm away from this mass is a smaller similar-appearing mass measuring 0.9 x 0.6 x 0.8 cm. Together, these 2 masses span approximately 3.3 cm. Ultrasound of the left axilla demonstrates 1 lymph node with a focal cortical bulge measuring 0.4 cm. IMPRESSION: 1. There are 2 suspicious adjacent masses in the left breast at 1 o'clock. Together, these 2 masses span approximately 3.3 cm. 2. There is 1 indeterminate left axillary lymph node with a 0.4 cm cortical bulge. 3.  No evidence of malignancy in the right breast. RECOMMENDATION: Ultrasound-guided biopsy is recommended for the 2 masses in the left breast and for the left axillary lymph node. This has been scheduled for 09/27/2019 at 12:45 p.m. I have discussed the findings and recommendations with the patient. If applicable, a reminder letter will be sent to the patient regarding the next appointment. BI-RADS CATEGORY  5: Highly suggestive of malignancy. Electronically Signed   By: Ammie Ferrier M.D.   On: 09/13/2019 16:12   MM DIAG  BREAST TOMO BILATERAL  Result Date: 09/13/2019 CLINICAL DATA:  69 year old female presenting for evaluation of a palpable lump in the left breast identified about 1 week ago. EXAM: DIGITAL DIAGNOSTIC BILATERAL MAMMOGRAM WITH TOMO AND CAD; ULTRASOUND LEFT BREAST LIMITED COMPARISON:  Previous exam(s). ACR Breast Density Category b: There are scattered areas of fibroglandular density. FINDINGS: A BB indicating the palpable site of concern has been placed on the upper-outer quadrant of the left breast. Deep to the palpable marker, there is a  round mass with angular and indistinct margins measuring approximately 1.3 cm. No other suspicious calcifications, masses or areas of distortion are seen in the bilateral breasts. Mammographic images were processed with CAD. Physical exam of the palpable site in the upper-outer quadrant of the left breast demonstrates a firm fairly superficial palpable lump. Ultrasound targeted to the left breast at 1 o'clock, 6 cm from the nipple demonstrates a hypoechoic irregular mass with indistinct margins measuring 1.4 x 1.0 x 1.1 cm. Approximately 1.2 cm away from this mass is a smaller similar-appearing mass measuring 0.9 x 0.6 x 0.8 cm. Together, these 2 masses span approximately 3.3 cm. Ultrasound of the left axilla demonstrates 1 lymph node with a focal cortical bulge measuring 0.4 cm. IMPRESSION: 1. There are 2 suspicious adjacent masses in the left breast at 1 o'clock. Together, these 2 masses span approximately 3.3 cm. 2. There is 1 indeterminate left axillary lymph node with a 0.4 cm cortical bulge. 3.  No evidence of malignancy in the right breast. RECOMMENDATION: Ultrasound-guided biopsy is recommended for the 2 masses in the left breast and for the left axillary lymph node. This has been scheduled for 09/27/2019 at 12:45 p.m. I have discussed the findings and recommendations with the patient. If applicable, a reminder letter will be sent to the patient regarding the next  appointment. BI-RADS CATEGORY  5: Highly suggestive of malignancy. Electronically Signed   By: Ammie Ferrier M.D.   On: 09/13/2019 16:12   [place] AXILLARY NODE CORE BIOPSY LEFT  Addendum Date: 09/30/2019   ADDENDUM REPORT: 09/30/2019 13:32 ADDENDUM: 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 This was found to be concordant by Dr. Hassan Rowan. 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 This was found to be concordant by Dr. Hassan Rowan. Pathology revealed LYMPH NODE TISSUE WITH NO METASTATIC CARCINOMA IDENTIFIED of the LEFT axilla. This was found to be concordant by Dr. Hassan Rowan. Pathology results were discussed with the patient by telephone. The patient reported doing well after the biopsies with tenderness at the sites. Post biopsy instructions and care were reviewed and questions were answered. The patient was encouraged to call [facility] for any additional concerns. My direct phone number was provided. The patient was referred to [facility] on October 09, 2019. Pathology results reported by Terie Purser, RN on 09/30/2019. Electronically Signed   By: Margarette Canada M.D.   On: 09/30/2019 13:32   Result Date: 09/30/2019 CLINICAL DATA:  69 year old female for tissue sampling of 1.4 cm UPPER-OUTER LEFT breast mass, 0.9 cm UPPER-OUTER LEFT breast mass and LEFT axillary lymph node with thickened cortex. EXAM: ULTRASOUND GUIDED LEFT BREAST CORE NEEDLE BIOPSY X 2 [place] AXILLARY NODE CORE BIOPSY LEFT COMPARISON:  Previous exam(s). PROCEDURE: I met with the patient and we discussed the procedure of ultrasound-guided biopsy, including benefits and alternatives. We discussed the high likelihood of a successful procedure. We discussed the risks of the procedure, including infection,  bleeding, tissue injury, clip migration, and inadequate sampling. Informed written consent was given. The usual time-out protocol was performed immediately prior to the procedure. ULTRASOUND GUIDED LEFT BREAST CORE NEEDLE BIOPSY #1 (1.4 cm mass at the 1 o'clock position-RIBBON clip): Lesion quadrant: UPPER-OUTER LEFT breast Using sterile technique and 1% Lidocaine as local anesthetic, under direct ultrasound visualization, a 12 gauge  spring-loaded device was used to perform biopsy of the 1.4 cm mass at the 1 o'clock position 6 cm from the nipple using a MEDIAL approach. At the conclusion of the procedure a RIBBON tissue marker clip was deployed into the biopsy cavity. Follow up 2 view mammogram was performed and dictated separately. ULTRASOUND-GUIDED LEFT BREAST CORE NEEDLE BIOPSY #2 (0.9 cm mass at the 1:30 o'clock position-COIL clip): Lesion quadrant: UPPER-OUTER LEFT breast Using sterile technique and 1% Lidocaine as local anesthetic, under direct ultrasound visualization, a 12 gauge spring-loaded device was used to perform biopsy of the 0.9 cm mass at the 1:30 position 6 cm from the nipple using a MEDIAL approach. At the conclusion of the procedure a COIL tissue marker clip was deployed into the biopsy cavity. Follow up 2 view mammogram was performed and dictated separately. ULTRASOUND GUIDED BIOPSY OF A LEFT AXILLARY LYMPH NODE (Q CLIP): Using sterile technique and 1% Lidocaine as local anesthetic, under direct ultrasound visualization, a 14 gauge spring-loaded device was used to perform biopsy of the LEFT axillary lymph node with focal cortical thickening using a MEDIAL approach. At the conclusion of the procedure a Q tissue marker clip was deployed into the biopsy cavity, with satisfactory placement confirmed sonographically. Follow up 2 view mammogram was performed and dictated separately. IMPRESSION: Ultrasound guided biopsy of a 1.4 cm UPPER-OUTER LEFT breast mass (RIBBON clip), a 0.9 cm UPPER-OUTER LEFT  breast mass (COIL clip), and a LEFT axillary lymph node with focal cortical thickening (Q clip). No apparent complications. Electronically Signed: By: Harmon Pier M.D. On: 09/27/2019 14:01   MM CLIP PLACEMENT LEFT  Result Date: 09/27/2019 CLINICAL DATA:  Evaluate RIBBON and COIL biopsy marker placement following ultrasound-guided LEFT breast biopsies, and Q biopsy marker placement following ultrasound-guided LEFT axillary biopsy. EXAM: DIAGNOSTIC LEFT MAMMOGRAM POST ULTRASOUND BIOPSY COMPARISON:  Previous exam(s). FINDINGS: Mammographic images were obtained following ultrasound guided biopsies of a 1.4 cm mass at the 1 o'clock position of the LEFT breast, a 0.9 cm mass at the 1:30 o'clock position of the LEFT breast and an abnormal LEFT axillary lymph node. The RIBBON biopsy marking clip is in expected position at the site of biopsy of the 1.4 cm UPPER-OUTER LEFT breast mass. The COIL biopsy marking clip is in expected position at the site of biopsy of the 0.9 cm UPPER-OUTER LEFT breast mass. The RIBBON and COIL clips are separated by a distance of 2 cm. The Q biopsy marking clip is in expected position at the site of biopsy of the LEFT axillary lymph node. IMPRESSION: Appropriate positioning of the RIBBON shaped biopsy marking clip at the site of biopsy in the UPPER OUTER LEFT breast (1.4 cm 1 o'clock position mass). Appropriate positioning of the COIL shaped biopsy marking clip at the site of biopsy in the UPPER OUTER LEFT breast (0.9 cm 1:30 o'clock position mass). Appropriate positioning of the Q shaped biopsy marking clip at the site of biopsy in the LEFT axilla. Final Assessment: Post Procedure Mammograms for Marker Placement Electronically Signed   By: Harmon Pier M.D.   On: 09/27/2019 14:00   [place] LT BREAST BX W LOC DEV 1ST LESION IMG BX SPEC US GUIDE  Addendum Date: 09/30/2019   ADDENDUM REPORT: 09/30/2019 13:32 ADDENDUM: Pathology revealed GRADE III INVASIVE DUCTAL CARCINOMA of the LEFT breast, 1  o'clock (ribbon clip), upper outer. This was found to be concordant by Dr. Laveda Abbe. Pathology revealed GRADE III INVASIVE DUCTAL CARCINOMA of the LEFT breast, 1:30 o'clock, (coil clip),  upper outer. This was found to be concordant by Dr. Hassan Rowan. Pathology revealed LYMPH NODE TISSUE WITH NO METASTATIC CARCINOMA IDENTIFIED of the LEFT axilla. This was found to be concordant by Dr. Hassan Rowan. Pathology results were discussed with the patient by telephone. The patient reported doing well after the biopsies with tenderness at the sites. Post biopsy instructions and care were reviewed and questions were answered. The patient was encouraged to call [facility] for any additional concerns. My direct phone number was provided. The patient was referred to [facility] on October 09, 2019. Pathology results reported by Terie Purser, RN on 09/30/2019. Electronically Signed   By: Margarette Canada M.D.   On: 09/30/2019 13:32   Result Date: 09/30/2019 CLINICAL DATA:  69 year old female for tissue sampling of 1.4 cm UPPER-OUTER LEFT breast mass, 0.9 cm UPPER-OUTER LEFT breast mass and LEFT axillary lymph node with thickened cortex. EXAM: ULTRASOUND GUIDED LEFT BREAST CORE NEEDLE BIOPSY X 2 [place] AXILLARY NODE CORE BIOPSY LEFT COMPARISON:  Previous exam(s). PROCEDURE: I met with the patient and we discussed the procedure of ultrasound-guided biopsy, including benefits and alternatives. We discussed the high likelihood of a successful procedure. We discussed the risks of the procedure, including infection, bleeding, tissue injury, clip migration, and inadequate sampling. Informed written consent was given. The usual time-out protocol was performed immediately prior to the procedure. ULTRASOUND GUIDED LEFT BREAST CORE NEEDLE BIOPSY #1 (1.4 cm mass at the 1 o'clock position-RIBBON clip): Lesion quadrant: UPPER-OUTER LEFT breast Using  sterile technique and 1% Lidocaine as local anesthetic, under direct ultrasound visualization, a 12 gauge spring-loaded device was used to perform biopsy of the 1.4 cm mass at the 1 o'clock position 6 cm from the nipple using a MEDIAL approach. At the conclusion of the procedure a RIBBON tissue marker clip was deployed into the biopsy cavity. Follow up 2 view mammogram was performed and dictated separately. ULTRASOUND-GUIDED LEFT BREAST CORE NEEDLE BIOPSY #2 (0.9 cm mass at the 1:30 o'clock position-COIL clip): Lesion quadrant: UPPER-OUTER LEFT breast Using sterile technique and 1% Lidocaine as local anesthetic, under direct ultrasound visualization, a 12 gauge spring-loaded device was used to perform biopsy of the 0.9 cm mass at the 1:30 position 6 cm from the nipple using a MEDIAL approach. At the conclusion of the procedure a COIL tissue marker clip was deployed into the biopsy cavity. Follow up 2 view mammogram was performed and dictated separately. ULTRASOUND GUIDED BIOPSY OF A LEFT AXILLARY LYMPH NODE (Q CLIP): Using sterile technique and 1% Lidocaine as local anesthetic, under direct ultrasound visualization, a 14 gauge spring-loaded device was used to perform biopsy of the LEFT axillary lymph node with focal cortical thickening using a MEDIAL approach. At the conclusion of the procedure a Q tissue marker clip was deployed into the biopsy cavity, with satisfactory placement confirmed sonographically. Follow up 2 view mammogram was performed and dictated separately. IMPRESSION: Ultrasound guided biopsy of a 1.4 cm UPPER-OUTER LEFT breast mass (RIBBON clip), a 0.9 cm UPPER-OUTER LEFT breast mass (COIL clip), and a LEFT axillary lymph node with focal cortical thickening (Q clip). No apparent complications. Electronically Signed: By: Margarette Canada M.D. On: 09/27/2019 14:01   [place] LT BREAST BX W LOC DEV EA ADD LESION IMG BX SPEC US GUIDE  Addendum Date: 09/30/2019   ADDENDUM REPORT: 09/30/2019 13:32 ADDENDUM:  Pathology revealed GRADE III INVASIVE DUCTAL CARCINOMA of the LEFT breast, 1 o'clock (  ribbon clip), upper outer. This was found to be concordant by Dr. Hassan Rowan. 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 This was found to be concordant by Dr. Hassan Rowan. Pathology revealed LYMPH NODE TISSUE WITH NO METASTATIC CARCINOMA IDENTIFIED of the LEFT axilla. This was found to be concordant by Dr. Hassan Rowan. Pathology results were discussed with the patient by telephone. The patient reported doing well after the biopsies with tenderness at the sites. Post biopsy instructions and care were reviewed and questions were answered. The patient was encouraged to call [facility] for any additional concerns. My direct phone number was provided. The patient was referred to [facility] on October 09, 2019. Pathology results reported by Terie Purser, RN on 09/30/2019. Electronically Signed   By: Margarette Canada M.D.   On: 09/30/2019 13:32   Result Date: 09/30/2019 CLINICAL DATA:  69 year old female for tissue sampling of 1.4 cm UPPER-OUTER LEFT breast mass, 0.9 cm UPPER-OUTER LEFT breast mass and LEFT axillary lymph node with thickened cortex. EXAM: ULTRASOUND GUIDED LEFT BREAST CORE NEEDLE BIOPSY X 2 [place] AXILLARY NODE CORE BIOPSY LEFT COMPARISON:  Previous exam(s). PROCEDURE: I met with the patient and we discussed the procedure of ultrasound-guided biopsy, including benefits and alternatives. We discussed the high likelihood of a successful procedure. We discussed the risks of the procedure, including infection, bleeding, tissue injury, clip migration, and inadequate sampling. Informed written consent was given. The usual time-out protocol was performed immediately prior to the procedure. ULTRASOUND GUIDED LEFT BREAST CORE NEEDLE BIOPSY #1 (1.4 cm mass at the 1 o'clock  position-RIBBON clip): Lesion quadrant: UPPER-OUTER LEFT breast Using sterile technique and 1% Lidocaine as local anesthetic, under direct ultrasound visualization, a 12 gauge spring-loaded device was used to perform biopsy of the 1.4 cm mass at the 1 o'clock position 6 cm from the nipple using a MEDIAL approach. At the conclusion of the procedure a RIBBON tissue marker clip was deployed into the biopsy cavity. Follow up 2 view mammogram was performed and dictated separately. ULTRASOUND-GUIDED LEFT BREAST CORE NEEDLE BIOPSY #2 (0.9 cm mass at the 1:30 o'clock position-COIL clip): Lesion quadrant: UPPER-OUTER LEFT breast Using sterile technique and 1% Lidocaine as local anesthetic, under direct ultrasound visualization, a 12 gauge spring-loaded device was used to perform biopsy of the 0.9 cm mass at the 1:30 position 6 cm from the nipple using a MEDIAL approach. At the conclusion of the procedure a COIL tissue marker clip was deployed into the biopsy cavity. Follow up 2 view mammogram was performed and dictated separately. ULTRASOUND GUIDED BIOPSY OF A LEFT AXILLARY LYMPH NODE (Q CLIP): Using sterile technique and 1% Lidocaine as local anesthetic, under direct ultrasound visualization, a 14 gauge spring-loaded device was used to perform biopsy of the LEFT axillary lymph node with focal cortical thickening using a MEDIAL approach. At the conclusion of the procedure a Q tissue marker clip was deployed into the biopsy cavity, with satisfactory placement confirmed sonographically. Follow up 2 view mammogram was performed and dictated separately. IMPRESSION: Ultrasound guided biopsy of a 1.4 cm UPPER-OUTER LEFT breast mass (RIBBON clip), a 0.9 cm UPPER-OUTER LEFT breast mass (COIL clip), and a LEFT axillary lymph node with focal cortical thickening (Q clip). No apparent complications. Electronically Signed: By: Margarette Canada M.D. On: 09/27/2019 14:01      IMPRESSION: Stage T1c, N1, Mx, Left Breast UOQ,  Invasive Ductal  Carcinoma, ER- / PR- / Her2-, Grade 3  The patient has been found to have a aggressive triple negative breast cancer.  Recommendations are for neoadjuvant chemotherapy.  MRI will give more information concerning whether these are 2 separate lesions or one large lesion but on mammography these appear to be 2 separate lesions in close proximity.  The patient will be a good candidate for breast conservation with radiotherapy to the left breast. We discussed the general course of radiation, potential side effects, and toxicities with radiation and the patient is interested in this approach.   PLAN:  1. Genetics 2. Breast MRI 3. Port-a-cath/echocardiogram/chemotherapy class 4. Neoadjuvant chemotherapy 5. Left lumpectomy with sentinel lymph node biopsy 6. Adjuvant radiation therapy   ------------------------------------------------  Blair Promise, PhD, MD  This document serves as a record of services personally performed by Gery Pray, MD. It was created on his behalf by Clerance Lav, a trained medical scribe. The creation of this record is based on the scribe's personal observations and the provider's statements to them. This document has been checked and approved by the attending provider.

## 2019-10-08 NOTE — Progress Notes (Signed)
[facility]  [phone] [phone]     ID: Cheyenne Mcdonald DOB: 15-Sep-1950  MR#: [id number]  HEM#:[id number]  Patient Care Team: Jackie Plum, MD as PCP - General (Internal Medicine) Maxie Better, MD as Consulting Physician (Obstetrics and Gynecology) Pershing Proud, RN as Oncology Nurse Navigator Donnelly Angelica, RN as Oncology Nurse Navigator Abigail Miyamoto, MD as Consulting Physician (General Surgery) Cornelio Parkerson, Valentino Hue, MD as Consulting Physician (Oncology) Antony Blackbird, MD as Consulting Physician (Radiation Oncology) Mcneil Sober, MD as Consulting Physician (Nephrology) Lowella Dell, MD OTHER MD:  CHIEF COMPLAINT: triple negative breast cancer  CURRENT TREATMENT: Neoadjuvant chemotherapy   HISTORY OF CURRENT ILLNESS: Cheyenne Mcdonald presented for her routine mammography with a palpable left breast lump. She underwent bilateral diagnostic mammography with tomography and left breast ultrasonography at [facility] on 09/13/2019 showing: breast density category B; two adjacent masses in left breast at 1 o'clock, spanning approximately 3.3 cm; one indeterminate left axillary lymph node with 0.4 cm cortical bulge; no evidence right breast malignancy.  Accordingly on 09/27/2019 she proceeded to biopsy of the left breast areas in question. The pathology from this procedure ([id number]) showed: invasive ductal carcinoma, grade 3, present in both of the adjacent masses. Prognostic indicators significant for: estrogen receptor, 0% negative and progesterone receptor, 0% negative. Proliferation marker Ki67 at 90%. HER2 equivocal by immunohistochemistry (2+), but negative by fluorescent in situ hybridization with a signals ratio 2.2 and number per cell 3.3. Additional analysis of more cells showed a signals ratio 2.05 and number per cell 3.13.  The biopsied lymph node was negative for carcinoma.  This was felt to be  concordant  The patient's subsequent history is as detailed below.   INTERVAL HISTORY: Cheyenne Mcdonald was evaluated in the multidisciplinary breast cancer clinic on 10/09/2019 accompanied by her husband Cheyenne Hua. Her case was also presented at the multidisciplinary breast cancer conference on the same day. At that time a preliminary plan was proposed: Genetics testing, neoadjuvant chemotherapy, breast conserving surgery, adjuvant radiation   REVIEW OF SYSTEMS: On the provided questionnaire, Cheyenne Mcdonald reports weight change, heart murmur, palpable breast lump, and arthritis in her right knee and hands. The patient denies unusual headaches, visual changes, nausea, vomiting, stiff neck, dizziness, or gait imbalance. There has been no cough, phlegm production, or pleurisy, no chest pain or pressure, and no change in bowel or bladder habits. The patient denies fever, rash, bleeding, or unexplained fatigue. A detailed review of systems was otherwise entirely negative.   PAST MEDICAL HISTORY: Past Medical History:  Diagnosis Date  . Breast cancer (HCC)   . Obesity   . Sleep apnea   heart murmur (since childhood)   PAST SURGICAL HISTORY: Past Surgical History:  Procedure Laterality Date  . ABDOMINAL HYSTERECTOMY      FAMILY HISTORY: History reviewed. No pertinent family history.  Her father died at age 19, cause of death unknown. Her mother is age 92 as of 09/2019. Amany has two brothers (and no sisters). She reports one cousin with breast cancer at age 17.   GYNECOLOGIC HISTORY:  No LMP recorded. Patient has had a hysterectomy. Menarche: 69 years old Age at first live birth: 69 years old GX P 2 LMP 1990 Contraceptive: used for maybe 20 years HRT: never used  Hysterectomy? Yes, 1990 BSO? 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 Husband Cheyenne Hua is retired Hotel manager and then retired Therapist, occupational.  At home is just  the 2 of them. Daughter Cheyenne Mcdonald, age 39, works  in [employer] entry in [employer]. Son Cheyenne Mcdonald, age 39, has a college degree but works for [employer] [place] [employer]. Cheyenne Mcdonald has four grandchildren. She attends [employer].    ADVANCED DIRECTIVES: In place   HEALTH MAINTENANCE: Social History   Tobacco Use  . Smoking status: Never Smoker  . Smokeless tobacco: Never Used  Substance Use Topics  . Alcohol use: Never  . Drug use: Never     Colonoscopy: 2018 (Dr. Collene Mares)  PAP: approx. 2013  Bone density: 2016, "normal"   Not on File  Current Outpatient Medications  Medication Sig Dispense Refill  . diclofenac sodium (VOLTAREN) 1 % GEL Apply 1 application topically 4 (four) times daily.      . meloxicam (MOBIC) 15 MG tablet Take 15 mg by mouth daily. With food      No current facility-administered medications for this visit.    OBJECTIVE: African-American woman who appears well  Vitals:   10/09/19 0841  BP: 130/85  Pulse: 63  Resp: 18  Temp: (!) 97.5 F (36.4 C)  SpO2: 100%     Body mass index is 32.05 kg/m.   Wt Readings from Last 3 Encounters:  10/09/19 183 lb 12.8 oz (83.4 kg)      ECOG FS:1 - Symptomatic but completely ambulatory  Ocular: Sclerae unicteric, pupils round and equal Ear-nose-throat: Wearing a mask Lymphatic: No cervical or supraclavicular adenopathy Lungs no rales or rhonchi Heart regular rate and rhythm Abd soft, nontender, positive bowel sounds MSK no focal spinal tenderness, no joint edema Neuro: non-focal, well-oriented, appropriate affect Breasts: The right breast is unremarkable.  The mass in the left breast is movable, in the upper outer quadrant, with no skin or nipple involvement.  There is palpable adenopathy in the left axilla.  The right axilla is benign.   LAB RESULTS:  CMP     Component Value Date/Time   NA 140 10/09/2019 0806   K 3.9 10/09/2019 0806   CL 107 10/09/2019 0806   CO2 25 10/09/2019 0806   GLUCOSE 95 10/09/2019 0806   BUN 22 10/09/2019 0806    CREATININE 1.11 (H) 10/09/2019 0806   CALCIUM 9.2 10/09/2019 0806   PROT 7.4 10/09/2019 0806   ALBUMIN 4.0 10/09/2019 0806   AST 18 10/09/2019 0806   ALT 17 10/09/2019 0806   ALKPHOS 77 10/09/2019 0806   BILITOT 0.3 10/09/2019 0806   GFRNONAA 50 (L) 10/09/2019 0806   GFRAA 58 (L) 10/09/2019 0806    No results found for: TOTALPROTELP, ALBUMINELP, A1GS, A2GS, BETS, BETA2SER, GAMS, MSPIKE, SPEI  Lab Results  Component Value Date   WBC 5.2 10/09/2019   NEUTROABS 3.2 10/09/2019   HGB 13.0 10/09/2019   HCT 39.8 10/09/2019   MCV 93.9 10/09/2019   PLT 180 10/09/2019    No results found for: LABCA2  No components found for: PXTGGY694  No results for input(s): INR in the last 168 hours.  No results found for: LABCA2  No results found for: WNI627  No results found for: OJJ009  No results found for: FGH829  No results found for: CA2729  No components found for: HGQUANT  No results found for: CEA1 / No results found for: CEA1   No results found for: AFPTUMOR  No results found for: CHROMOGRNA  No results found for: KPAFRELGTCHN, LAMBDASER, KAPLAMBRATIO (kappa/lambda light chains)  No results found for: HGBA, HGBA2QUANT, HGBFQUANT, HGBSQUAN (Hemoglobinopathy evaluation)  No results found for: LDH  No results found for: IRON, TIBC, IRONPCTSAT (Iron and TIBC)  No results found for: FERRITIN  Urinalysis No results found for: COLORURINE, APPEARANCEUR, LABSPEC, PHURINE, GLUCOSEU, HGBUR, BILIRUBINUR, KETONESUR, PROTEINUR, UROBILINOGEN, NITRITE, LEUKOCYTESUR   STUDIES: US BREAST LTD UNI LEFT INC AXILLA  Result Date: 09/13/2019 CLINICAL DATA:  69 year old female presenting for evaluation of a palpable lump in the left breast identified about 1 week ago. EXAM: DIGITAL DIAGNOSTIC BILATERAL MAMMOGRAM WITH TOMO AND CAD; ULTRASOUND LEFT BREAST LIMITED COMPARISON:  Previous exam(s). ACR Breast Density Category b: There are scattered areas of fibroglandular density. FINDINGS:  A BB indicating the palpable site of concern has been placed on the upper-outer quadrant of the left breast. Deep to the palpable marker, there is a round mass with angular and indistinct margins measuring approximately 1.3 cm. No other suspicious calcifications, masses or areas of distortion are seen in the bilateral breasts. Mammographic images were processed with CAD. Physical exam of the palpable site in the upper-outer quadrant of the left breast demonstrates a firm fairly superficial palpable lump. Ultrasound targeted to the left breast at 1 o'clock, 6 cm from the nipple demonstrates a hypoechoic irregular mass with indistinct margins measuring 1.4 x 1.0 x 1.1 cm. Approximately 1.2 cm away from this mass is a smaller similar-appearing mass measuring 0.9 x 0.6 x 0.8 cm. Together, these 2 masses span approximately 3.3 cm. Ultrasound of the left axilla demonstrates 1 lymph node with a focal cortical bulge measuring 0.4 cm. IMPRESSION: 1. There are 2 suspicious adjacent masses in the left breast at 1 o'clock. Together, these 2 masses span approximately 3.3 cm. 2. There is 1 indeterminate left axillary lymph node with a 0.4 cm cortical bulge. 3.  No evidence of malignancy in the right breast. RECOMMENDATION: Ultrasound-guided biopsy is recommended for the 2 masses in the left breast and for the left axillary lymph node. This has been scheduled for 09/27/2019 at 12:45 p.m. I have discussed the findings and recommendations with the patient. If applicable, a reminder letter will be sent to the patient regarding the next appointment. BI-RADS CATEGORY  5: Highly suggestive of malignancy. Electronically Signed   By: Ammie Ferrier M.D.   On: 09/13/2019 16:12   MM DIAG BREAST TOMO BILATERAL  Result Date: 09/13/2019 CLINICAL DATA:  69 year old female presenting for evaluation of a palpable lump in the left breast identified about 1 week ago. EXAM: DIGITAL DIAGNOSTIC BILATERAL MAMMOGRAM WITH TOMO AND CAD; ULTRASOUND  LEFT BREAST LIMITED COMPARISON:  Previous exam(s). ACR Breast Density Category b: There are scattered areas of fibroglandular density. FINDINGS: A BB indicating the palpable site of concern has been placed on the upper-outer quadrant of the left breast. Deep to the palpable marker, there is a round mass with angular and indistinct margins measuring approximately 1.3 cm. No other suspicious calcifications, masses or areas of distortion are seen in the bilateral breasts. Mammographic images were processed with CAD. Physical exam of the palpable site in the upper-outer quadrant of the left breast demonstrates a firm fairly superficial palpable lump. Ultrasound targeted to the left breast at 1 o'clock, 6 cm from the nipple demonstrates a hypoechoic irregular mass with indistinct margins measuring 1.4 x 1.0 x 1.1 cm. Approximately 1.2 cm away from this mass is a smaller similar-appearing mass measuring 0.9 x 0.6 x 0.8 cm. Together, these 2 masses span approximately 3.3 cm. Ultrasound of the left axilla demonstrates 1 lymph node with a focal cortical bulge measuring 0.4 cm. IMPRESSION:  1. There are 2 suspicious adjacent masses in the left breast at 1 o'clock. Together, these 2 masses span approximately 3.3 cm. 2. There is 1 indeterminate left axillary lymph node with a 0.4 cm cortical bulge. 3.  No evidence of malignancy in the right breast. RECOMMENDATION: Ultrasound-guided biopsy is recommended for the 2 masses in the left breast and for the left axillary lymph node. This has been scheduled for 09/27/2019 at 12:45 p.m. I have discussed the findings and recommendations with the patient. If applicable, a reminder letter will be sent to the patient regarding the next appointment. BI-RADS CATEGORY  5: Highly suggestive of malignancy. Electronically Signed   By: Ammie Ferrier M.D.   On: 09/13/2019 16:12   [place] AXILLARY NODE CORE BIOPSY LEFT  Addendum Date: 09/30/2019   ADDENDUM REPORT: 09/30/2019 13:32 ADDENDUM:  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 This was found to be concordant by Dr. Hassan Rowan. 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 This was found to be concordant by Dr. Hassan Rowan. Pathology revealed LYMPH NODE TISSUE WITH NO METASTATIC CARCINOMA IDENTIFIED of the LEFT axilla. This was found to be concordant by Dr. Hassan Rowan. Pathology results were discussed with the patient by telephone. The patient reported doing well after the biopsies with tenderness at the sites. Post biopsy instructions and care were reviewed and questions were answered. The patient was encouraged to call [facility] for any additional concerns. My direct phone number was provided. The patient was referred to [facility] at [facility] on October 09, 2019. Pathology results reported by Terie Purser, RN on 09/30/2019. Electronically Signed   By: Margarette Canada M.D.   On: 09/30/2019 13:32   Result Date: 09/30/2019 CLINICAL DATA:  69 year old female for tissue sampling of 1.4 cm UPPER-OUTER LEFT breast mass, 0.9 cm UPPER-OUTER LEFT breast mass and LEFT axillary lymph node with thickened cortex. EXAM: ULTRASOUND GUIDED LEFT BREAST CORE NEEDLE BIOPSY X 2 [place] AXILLARY NODE CORE BIOPSY LEFT COMPARISON:  Previous exam(s). PROCEDURE: I met with the patient and we discussed the procedure of ultrasound-guided biopsy, including benefits and alternatives. We discussed the high likelihood of a successful procedure. We discussed the risks of the procedure, including infection, bleeding, tissue injury, clip migration, and inadequate sampling. Informed written consent was given. The usual time-out protocol was performed immediately prior to the procedure. ULTRASOUND GUIDED LEFT BREAST CORE NEEDLE BIOPSY #1 (1.4 cm mass at the 1 o'clock  position-RIBBON clip): Lesion quadrant: UPPER-OUTER LEFT breast Using sterile technique and 1% Lidocaine as local anesthetic, under direct ultrasound visualization, a 12 gauge spring-loaded device was used to perform biopsy of the 1.4 cm mass at the 1 o'clock position 6 cm from the nipple using a MEDIAL approach. At the conclusion of the procedure a RIBBON tissue marker clip was deployed into the biopsy cavity. Follow up 2 view mammogram was performed and dictated separately. ULTRASOUND-GUIDED LEFT BREAST CORE NEEDLE BIOPSY #2 (0.9 cm mass at the 1:30 o'clock position-COIL clip): Lesion quadrant: UPPER-OUTER LEFT breast Using sterile technique and 1% Lidocaine as local anesthetic, under direct ultrasound visualization, a 12 gauge spring-loaded device was used to perform biopsy of the 0.9 cm mass at the 1:30 position 6 cm from the nipple using a MEDIAL approach. At the conclusion of the procedure a COIL tissue marker clip was  deployed into the biopsy cavity. Follow up 2 view mammogram was performed and dictated separately. ULTRASOUND GUIDED BIOPSY OF A LEFT AXILLARY LYMPH NODE (Q CLIP): Using sterile technique and 1% Lidocaine as local anesthetic, under direct ultrasound visualization, a 14 gauge spring-loaded device was used to perform biopsy of the LEFT axillary lymph node with focal cortical thickening using a MEDIAL approach. At the conclusion of the procedure a Q tissue marker clip was deployed into the biopsy cavity, with satisfactory placement confirmed sonographically. Follow up 2 view mammogram was performed and dictated separately. IMPRESSION: Ultrasound guided biopsy of a 1.4 cm UPPER-OUTER LEFT breast mass (RIBBON clip), a 0.9 cm UPPER-OUTER LEFT breast mass (COIL clip), and a LEFT axillary lymph node with focal cortical thickening (Q clip). No apparent complications. Electronically Signed: By: Margarette Canada M.D. On: 09/27/2019 14:01   MM CLIP PLACEMENT LEFT  Result Date: 09/27/2019 CLINICAL DATA:   Evaluate RIBBON and COIL biopsy marker placement following ultrasound-guided LEFT breast biopsies, and Q biopsy marker placement following ultrasound-guided LEFT axillary biopsy. EXAM: DIAGNOSTIC LEFT MAMMOGRAM POST ULTRASOUND BIOPSY COMPARISON:  Previous exam(s). FINDINGS: Mammographic images were obtained following ultrasound guided biopsies of a 1.4 cm mass at the 1 o'clock position of the LEFT breast, a 0.9 cm mass at the 1:30 o'clock position of the LEFT breast and an abnormal LEFT axillary lymph node. The RIBBON biopsy marking clip is in expected position at the site of biopsy of the 1.4 cm UPPER-OUTER LEFT breast mass. The COIL biopsy marking clip is in expected position at the site of biopsy of the 0.9 cm UPPER-OUTER LEFT breast mass. The RIBBON and COIL clips are separated by a distance of 2 cm. The Q biopsy marking clip is in expected position at the site of biopsy of the LEFT axillary lymph node. IMPRESSION: Appropriate positioning of the RIBBON shaped biopsy marking clip at the site of biopsy in the UPPER OUTER LEFT breast (1.4 cm 1 o'clock position mass). Appropriate positioning of the COIL shaped biopsy marking clip at the site of biopsy in the UPPER OUTER LEFT breast (0.9 cm 1:30 o'clock position mass). Appropriate positioning of the Q shaped biopsy marking clip at the site of biopsy in the LEFT axilla. Final Assessment: Post Procedure Mammograms for Marker Placement Electronically Signed   By: Margarette Canada M.D.   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 This was found to be concordant by Dr. Hassan Rowan. 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 This was found to be concordant by Dr. Hassan Rowan. Pathology  revealed LYMPH NODE TISSUE WITH NO METASTATIC CARCINOMA IDENTIFIED of the LEFT axilla. This was found to be concordant by Dr. Hassan Rowan. Pathology results were discussed with the patient by telephone. The patient reported doing well after the biopsies with tenderness at the sites. Post biopsy instructions and care were reviewed and questions were answered. The patient was encouraged to call [facility] for any additional concerns. My direct phone number was provided. The patient was referred to [facility] at [facility] on October 09, 2019. Pathology results reported by Terie Purser, RN on 09/30/2019. Electronically Signed   By: Dellis Filbert  Hu M.D.   On: 09/30/2019 13:32   Result Date: 09/30/2019 CLINICAL DATA:  69 year old female for tissue sampling of 1.4 cm UPPER-OUTER LEFT breast mass, 0.9 cm UPPER-OUTER LEFT breast mass and LEFT axillary lymph node with thickened cortex. EXAM: ULTRASOUND GUIDED LEFT BREAST CORE NEEDLE BIOPSY X 2 [place] AXILLARY NODE CORE BIOPSY LEFT COMPARISON:  Previous exam(s). PROCEDURE: I met with the patient and we discussed the procedure of ultrasound-guided biopsy, including benefits and alternatives. We discussed the high likelihood of a successful procedure. We discussed the risks of the procedure, including infection, bleeding, tissue injury, clip migration, and inadequate sampling. Informed written consent was given. The usual time-out protocol was performed immediately prior to the procedure. ULTRASOUND GUIDED LEFT BREAST CORE NEEDLE BIOPSY #1 (1.4 cm mass at the 1 o'clock position-RIBBON clip): Lesion quadrant: UPPER-OUTER LEFT breast Using sterile technique and 1% Lidocaine as local anesthetic, under direct ultrasound visualization, a 12 gauge spring-loaded device was used to perform biopsy of the 1.4 cm mass at the 1 o'clock position 6 cm from the nipple using a MEDIAL approach. At the conclusion  of the procedure a RIBBON tissue marker clip was deployed into the biopsy cavity. Follow up 2 view mammogram was performed and dictated separately. ULTRASOUND-GUIDED LEFT BREAST CORE NEEDLE BIOPSY #2 (0.9 cm mass at the 1:30 o'clock position-COIL clip): Lesion quadrant: UPPER-OUTER LEFT breast Using sterile technique and 1% Lidocaine as local anesthetic, under direct ultrasound visualization, a 12 gauge spring-loaded device was used to perform biopsy of the 0.9 cm mass at the 1:30 position 6 cm from the nipple using a MEDIAL approach. At the conclusion of the procedure a COIL tissue marker clip was deployed into the biopsy cavity. Follow up 2 view mammogram was performed and dictated separately. ULTRASOUND GUIDED BIOPSY OF A LEFT AXILLARY LYMPH NODE (Q CLIP): Using sterile technique and 1% Lidocaine as local anesthetic, under direct ultrasound visualization, a 14 gauge spring-loaded device was used to perform biopsy of the LEFT axillary lymph node with focal cortical thickening using a MEDIAL approach. At the conclusion of the procedure a Q tissue marker clip was deployed into the biopsy cavity, with satisfactory placement confirmed sonographically. Follow up 2 view mammogram was performed and dictated separately. IMPRESSION: Ultrasound guided biopsy of a 1.4 cm UPPER-OUTER LEFT breast mass (RIBBON clip), a 0.9 cm UPPER-OUTER LEFT breast mass (COIL clip), and a LEFT axillary lymph node with focal cortical thickening (Q clip). No apparent complications. Electronically Signed: By: Margarette Canada M.D. 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 This was found to be concordant by Dr. Hassan Rowan. Pathology revealed GRADE III INVASIVE DUCTAL CARCINOMA of the LEFT breast, 1:30 o'clock, (coil clip),  upper outer. This was found to be concordant by Dr. Hassan Rowan. Pathology revealed LYMPH NODE TISSUE WITH NO METASTATIC CARCINOMA IDENTIFIED of the LEFT axilla. This was found to be concordant by Dr. Hassan Rowan. Pathology results were discussed with the patient by telephone. The patient reported doing well after the biopsies with tenderness at the sites. Post biopsy instructions and care were reviewed and questions were answered. The patient was encouraged to call [facility] for any additional concerns. My direct phone number was provided. The patient was [facility] on October 09, 2019. Pathology results reported by Terie Purser, RN on 09/30/2019. Electronically Signed   By: Margarette Canada M.D.   On: 09/30/2019 13:32   Result Date: 09/30/2019 CLINICAL DATA:  69 year old female for tissue sampling of 1.4 cm UPPER-OUTER LEFT breast mass, 0.9 cm UPPER-OUTER LEFT breast mass and LEFT axillary lymph node with thickened cortex. EXAM: ULTRASOUND GUIDED LEFT BREAST CORE NEEDLE BIOPSY X 2 [place] AXILLARY NODE CORE BIOPSY LEFT COMPARISON:  Previous exam(s). PROCEDURE: I met with the patient and we discussed the procedure of ultrasound-guided biopsy, including benefits and alternatives. We discussed the high likelihood of a successful procedure. We discussed the risks of the procedure, including infection, bleeding, tissue injury, clip migration, and inadequate sampling. Informed written consent was given. The usual time-out protocol was performed immediately prior to the procedure. ULTRASOUND GUIDED LEFT BREAST CORE NEEDLE BIOPSY #1 (1.4 cm mass at the 1 o'clock position-RIBBON clip): Lesion quadrant: UPPER-OUTER LEFT breast Using sterile technique and 1% Lidocaine as local anesthetic, under direct ultrasound visualization, a 12 gauge spring-loaded device was used to perform biopsy of the 1.4 cm mass at the 1 o'clock  position 6 cm from the nipple using a MEDIAL approach. At the conclusion of the procedure a RIBBON tissue marker clip was deployed into the biopsy cavity. Follow up 2 view mammogram was performed and dictated separately. ULTRASOUND-GUIDED LEFT BREAST CORE NEEDLE BIOPSY #2 (0.9 cm mass at the 1:30 o'clock position-COIL clip): Lesion quadrant: UPPER-OUTER LEFT breast Using sterile technique and 1% Lidocaine as local anesthetic, under direct ultrasound visualization, a 12 gauge spring-loaded device was used to perform biopsy of the 0.9 cm mass at the 1:30 position 6 cm from the nipple using a MEDIAL approach. At the conclusion of the procedure a COIL tissue marker clip was deployed into the biopsy cavity. Follow up 2 view mammogram was performed and dictated separately. ULTRASOUND GUIDED BIOPSY OF A LEFT AXILLARY LYMPH NODE (Q CLIP): Using sterile technique and 1% Lidocaine as local anesthetic, under direct ultrasound visualization, a 14 gauge spring-loaded device was used to perform biopsy of the LEFT axillary lymph node with focal cortical thickening using a MEDIAL approach. At the conclusion of the procedure a Q tissue marker clip was deployed into the biopsy cavity, with satisfactory placement confirmed sonographically. Follow up 2 view mammogram was performed and dictated separately. IMPRESSION: Ultrasound guided biopsy of a 1.4 cm UPPER-OUTER LEFT breast mass (RIBBON clip), a 0.9 cm UPPER-OUTER LEFT breast mass (COIL clip), and a LEFT axillary lymph node with focal cortical thickening (Q clip). No apparent complications. Electronically Signed: By: Margarette Canada M.D. On: 09/27/2019 14:01     ELIGIBLE FOR AVAILABLE RESEARCH PROTOCOL: AET  ASSESSMENT: 69 y.o. [place] woman status post left breast upper outer quadrant biopsy 09/27/2019 for a clinically T1-T2 N0, stage IA- IIA invasive ductal carcinoma, grade 3, triple negative, with an MIB-1 of 90%.  (1) genetics testing pending  (2) neoadjuvant  chemotherapy will consist of doxorubicin and cyclophosphamide in dose dense fashion x4 followed by weekly carboplatin and paclitaxel x12  (3) definitive surgery to follow  (4) adjuvant radiation as appropriate  PLAN: I met today with Cheyenne Mcdonald to review her new diagnosis. Specifically we discussed the biology of her breast cancer, its diagnosis, staging, treatment  options and prognosis. We first reviewed the fact that cancer is not one disease but more than 100 different diseases and that it is important to keep them separate--  otherwise when friends and relatives discuss their own cancer experiences with Cheyenne Mcdonald confusion can result. Similarly we explained that if breast cancer spreads to the bone or liver, the patient would not have bone cancer or liver cancer, but breast cancer in the bone and breast cancer in the liver: one cancer in three places-- not 3 different cancers which otherwise would have to be treated in 3 different ways.  We discussed the difference between local and systemic therapy. In terms of loco-regional treatment, lumpectomy plus radiation is equivalent to mastectomy as far as survival is concerned. For this reason, and because the cosmetic results are generally superior, we recommend breast conserving surgery.   We also noted that in terms of sequencing of treatments, whether systemic therapy or surgery is done first does not affect the ultimate outcome.  This is relevant to [name]'s situation since treating her neoadjuvantly will not only give her time to get her genetics results and plan accordingly, but will also give [place] information regarding her response, the goal of course being a complete pathologic response which would indicate an excellent long-term prognosis  We then discussed the rationale for systemic therapy. There is some risk that this cancer may have already spread to other parts of her body. Patients frequently ask at this point about bone scans, CAT scans and PET scans to  find out if they have occult breast cancer somewhere else. The problem is that in early stage disease we are much more likely to find false positives then true cancers and this would expose the patient to unnecessary procedures as well as unnecessary radiation. Scans cannot answer the question the patient really would like to know, which is whether she has microscopic disease elsewhere in her body. For those reasons we do not recommend them.  Of course we would proceed to aggressive evaluation of any symptoms that might suggest metastatic disease, but that is not the case here.  Next we went over the options for systemic therapy which are anti-estrogens, anti-HER-2 immunotherapy, and chemotherapy. Cheyenne Mcdonald does not meet criteria for anti-HER-2 immunotherapy or anti-estrogens.  Patients with triple negative tumors unfortunately must have chemotherapy if they are to have any systemic treatment  We specifically discussed doxorubicin and cyclophosphamide in dose dense fashion x4 followed by weekly carboplatin and paclitaxel x12.  Hopefully we can start 10/22/2019.  She will see me before that date to make sure everything is in place  Cheyenne Mcdonald has a good understanding of the overall plan. She agrees with it. She knows the goal of treatment in her case is cure. She will call with any problems that may develop before her next visit here.  Total encounter time 65 minutes.Sarajane Jews C. Pammie Chirino, MD 10/09/2019 10:51 AM Medical Oncology and Hematology [facility], [place] [address] Tel. [phone]    Fax. [phone]   This document serves as a record of services personally performed by Lurline Del, MD. It was created on his behalf by Wilburn Mylar, a trained medical scribe. The creation of this record is based on the scribe's personal observations and the provider's statements to them.   I, Lurline Del MD, have reviewed the above documentation for accuracy and  completeness, and I agree with the above.   *Total Encounter Time as defined by the Centers for Medicare and Medicaid Services includes, in addition to the face-to-face time of a patient visit (documented in the note above) non-face-to-face time: obtaining and reviewing outside history, ordering  and reviewing medications, tests or procedures, care coordination (communications with other health care professionals or caregivers) and documentation in the medical record.

## 2019-10-09 ENCOUNTER — Ambulatory Visit: Payer: Medicare HMO

## 2019-10-09 ENCOUNTER — Other Ambulatory Visit: Payer: Self-pay | Admitting: Surgery

## 2019-10-09 ENCOUNTER — Other Ambulatory Visit: Payer: Self-pay

## 2019-10-09 ENCOUNTER — Encounter: Payer: Self-pay | Admitting: Licensed Clinical Social Worker

## 2019-10-09 ENCOUNTER — Other Ambulatory Visit: Payer: Self-pay | Admitting: *Deleted

## 2019-10-09 ENCOUNTER — Encounter: Payer: Self-pay | Admitting: Genetic Counselor

## 2019-10-09 ENCOUNTER — Encounter: Payer: Self-pay | Admitting: Physical Therapy

## 2019-10-09 ENCOUNTER — Encounter: Payer: Self-pay | Admitting: *Deleted

## 2019-10-09 ENCOUNTER — Encounter: Payer: Self-pay | Admitting: Oncology

## 2019-10-09 ENCOUNTER — Inpatient Hospital Stay: Payer: Medicare HMO

## 2019-10-09 ENCOUNTER — Ambulatory Visit: Payer: Medicare HMO | Attending: Surgery | Admitting: Physical Therapy

## 2019-10-09 ENCOUNTER — Inpatient Hospital Stay: Payer: Medicare HMO | Attending: Oncology | Admitting: Oncology

## 2019-10-09 ENCOUNTER — Ambulatory Visit
Admission: RE | Admit: 2019-10-09 | Discharge: 2019-10-09 | Disposition: A | Discharge status: Home / Self Care | Payer: Medicare HMO | Source: Ambulatory Visit | Attending: Radiation Oncology | Admitting: Radiation Oncology

## 2019-10-09 ENCOUNTER — Ambulatory Visit: Payer: Medicare HMO | Admitting: Genetic Counselor

## 2019-10-09 VITALS — BP 130/85 | HR 63 | Temp 97.5°F | Resp 18 | Ht 63.5 in | Wt 183.8 lb

## 2019-10-09 DIAGNOSIS — Z791 Long term (current) use of non-steroidal anti-inflammatories (NSAID): Secondary | ICD-10-CM | POA: Insufficient documentation

## 2019-10-09 DIAGNOSIS — G4730 Sleep apnea, unspecified: Secondary | ICD-10-CM | POA: Diagnosis not present

## 2019-10-09 DIAGNOSIS — Z171 Estrogen receptor negative status [ER-]: Secondary | ICD-10-CM

## 2019-10-09 DIAGNOSIS — C50412 Malignant neoplasm of upper-outer quadrant of left female breast: Secondary | ICD-10-CM

## 2019-10-09 DIAGNOSIS — R011 Cardiac murmur, unspecified: Secondary | ICD-10-CM | POA: Diagnosis not present

## 2019-10-09 DIAGNOSIS — Z803 Family history of malignant neoplasm of breast: Secondary | ICD-10-CM

## 2019-10-09 DIAGNOSIS — Z6832 Body mass index (BMI) 32.0-32.9, adult: Secondary | ICD-10-CM | POA: Insufficient documentation

## 2019-10-09 DIAGNOSIS — E669 Obesity, unspecified: Secondary | ICD-10-CM | POA: Diagnosis not present

## 2019-10-09 DIAGNOSIS — R293 Abnormal posture: Secondary | ICD-10-CM | POA: Diagnosis not present

## 2019-10-09 DIAGNOSIS — C50912 Malignant neoplasm of unspecified site of left female breast: Secondary | ICD-10-CM | POA: Diagnosis not present

## 2019-10-09 LAB — CBC WITH DIFFERENTIAL (CANCER CENTER ONLY)
Abs Immature Granulocytes: 0.02 10*3/uL (ref 0.00–0.07)
Basophils Absolute: 0 10*3/uL (ref 0.0–0.1)
Basophils Relative: 1 %
Eosinophils Absolute: 0.1 10*3/uL (ref 0.0–0.5)
Eosinophils Relative: 2 %
HCT: 39.8 % (ref 36.0–46.0)
Hemoglobin: 13 g/dL (ref 12.0–15.0)
Immature Granulocytes: 0 %
Lymphocytes Relative: 25 %
Lymphs Abs: 1.3 10*3/uL (ref 0.7–4.0)
MCH: 30.7 pg (ref 26.0–34.0)
MCHC: 32.7 g/dL (ref 30.0–36.0)
MCV: 93.9 fL (ref 80.0–100.0)
Monocytes Absolute: 0.6 10*3/uL (ref 0.1–1.0)
Monocytes Relative: 11 %
Neutro Abs: 3.2 10*3/uL (ref 1.7–7.7)
Neutrophils Relative %: 61 %
Platelet Count: 180 10*3/uL (ref 150–400)
RBC: 4.24 MIL/uL (ref 3.87–5.11)
RDW: 12.4 % (ref 11.5–15.5)
WBC Count: 5.2 10*3/uL (ref 4.0–10.5)
nRBC: 0 % (ref 0.0–0.2)

## 2019-10-09 LAB — CMP (CANCER CENTER ONLY)
ALT: 17 U/L (ref 0–44)
AST: 18 U/L (ref 15–41)
Albumin: 4 g/dL (ref 3.5–5.0)
Alkaline Phosphatase: 77 U/L (ref 38–126)
Anion gap: 8 (ref 5–15)
BUN: 22 mg/dL (ref 8–23)
CO2: 25 mmol/L (ref 22–32)
Calcium: 9.2 mg/dL (ref 8.9–10.3)
Chloride: 107 mmol/L (ref 98–111)
Creatinine: 1.11 mg/dL — ABNORMAL HIGH (ref 0.44–1.00)
GFR, Est AFR Am: 58 mL/min — ABNORMAL LOW (ref >60)
GFR, Estimated: 50 mL/min — ABNORMAL LOW (ref >60)
Glucose, Bld: 95 mg/dL (ref 70–99)
Potassium: 3.9 mmol/L (ref 3.5–5.1)
Sodium: 140 mmol/L (ref 135–145)
Total Bilirubin: 0.3 mg/dL (ref 0.3–1.2)
Total Protein: 7.4 g/dL (ref 6.5–8.1)

## 2019-10-09 LAB — GENETIC SCREENING ORDER

## 2019-10-09 MED ORDER — LIDOCAINE-PRILOCAINE 2.5-2.5 % EX CREA: TOPICAL_CREAM | CUTANEOUS | 3 refills | Status: DC

## 2019-10-09 MED ORDER — PROCHLORPERAZINE MALEATE 10 MG PO TABS: 10.0000 mg | ORAL_TABLET | Freq: Four times a day (QID) | ORAL | 1 refills | Status: DC | PRN

## 2019-10-09 MED ORDER — DEXAMETHASONE 4 MG PO TABS: ORAL_TABLET | ORAL | 1 refills | Status: DC

## 2019-10-09 MED ORDER — LORATADINE 10 MG PO TABS: 10.0000 mg | ORAL_TABLET | Freq: Every day | ORAL | 0 refills | Status: DC

## 2019-10-09 MED ORDER — LORAZEPAM 0.5 MG PO TABS: 0.5000 mg | ORAL_TABLET | Freq: Every evening | ORAL | 0 refills | Status: DC | PRN

## 2019-10-09 NOTE — Progress Notes (Signed)

## 2019-10-09 NOTE — Therapy (Signed)
[place], [place], [address] Phone: [phone]   Fax:  [phone]  Physical Therapy Evaluation  Patient Details  Name: Cheyenne Mcdonald MRN: [id number] Date of Birth: 1950/11/12 Referring Provider (PT): Dr. [name]   Encounter Date: 10/09/2019   PT End of Session - 10/09/19 1309    Visit Number 1    Number of Visits 2    Date for PT Re-Evaluation 04/07/20    PT Start Time 0950    PT Stop Time 1028    PT Time Calculation (min) 38 min    Activity Tolerance Patient tolerated treatment well    Behavior During Therapy [facility] for tasks assessed/performed           Past Medical History:  Diagnosis Date  . Breast cancer ([place])   . Obesity   . Sleep apnea     Past Surgical History:  Procedure Laterality Date  . ABDOMINAL HYSTERECTOMY      There were no vitals filed for this visit.    Subjective Assessment - 10/09/19 1245    Subjective Patient reports she is here today to be seen by her medical team for her newly diagnosed left breast cancer.    Patient is accompained by: Family member    Pertinent History Patient was diagnosed on 09/30/2019 with left grade III triple negative invasive ductal carcinoma breast cancer. It measures 9 mm and 1.4 cm both located in the upper outer quadrant. Ki67 is 90%.    Patient Stated Goals Reduce lymphedema risk and learn post op shoulder ROM HEP    Currently in Pain? No/denies              [facility] PT Assessment - 10/09/19 0001      Assessment   Medical Diagnosis Left breast cancer    Referring Provider (PT) Dr. [name]    Onset Date/Surgical Date 09/30/19    Hand Dominance Right    Prior Therapy None      Precautions   Precautions Other (comment)    Precaution Comments active cancer      Restrictions   Weight Bearing Restrictions No      Balance Screen   Has the patient fallen in the past 6 months No    Has the patient had a decrease  in activity level because of a fear of falling?  No    Is the patient reluctant to leave their home because of a fear of falling?  No      Home Environment   Living Environment Private residence    Living Arrangements Spouse/significant other    Available Help at Discharge Family      Prior Function   Level of [place] Retired    Leisure She walk 1.2-2 hours per day 5x/week and has lost 60# since 2/21      Cognition   Overall Cognitive Status Within Functional Limits for tasks assessed      Posture/Postural Control   Posture/Postural Control Postural limitations    Postural Limitations Rounded Shoulders;Forward head      ROM / Strength   AROM / PROM / Strength AROM;Strength      AROM   Overall AROM Comments All cervical AROM is limited 25% except flexion is WNL    AROM Assessment Site Shoulder    Right/Left Shoulder Right;Left    Right Shoulder Extension 48 Degrees    Right Shoulder Flexion 155 Degrees  Right Shoulder ABduction 157 Degrees    Right Shoulder Internal Rotation 66 Degrees    Right Shoulder External Rotation 76 Degrees    Left Shoulder Extension 52 Degrees    Left Shoulder Flexion 148 Degrees    Left Shoulder ABduction 147 Degrees    Left Shoulder Internal Rotation 62 Degrees    Left Shoulder External Rotation 72 Degrees      Strength   Overall Strength Within functional limits for tasks performed             LYMPHEDEMA/ONCOLOGY QUESTIONNAIRE - 10/09/19 0001      Type   Cancer Type Left breast cancer      Lymphedema Assessments   Lymphedema Assessments Upper extremities      Right Upper Extremity Lymphedema   10 cm Proximal to Olecranon Process 31.4 cm    Olecranon Process 26.6 cm    10 cm Proximal to Ulnar Styloid Process 22.9 cm    Just Proximal to Ulnar Styloid Process 16.6 cm    Across Hand at [employer] 19.9 cm    At [place] of 2nd Digit 6.7 cm      Left Upper Extremity Lymphedema   10 cm Proximal to  Olecranon Process 32.1 cm    Olecranon Process 25.8 cm    10 cm Proximal to Ulnar Styloid Process 21.8 cm    Just Proximal to Ulnar Styloid Process 16.2 cm    Across Hand at [employer] 19.9 cm    At [place] of 2nd Digit 6.2 cm           L-DEX FLOWSHEETS - 10/09/19 1200      L-DEX LYMPHEDEMA SCREENING   Measurement Type Unilateral    L-DEX MEASUREMENT EXTREMITY Upper Extremity    POSITION  Standing    DOMINANT SIDE Left    At Risk Side Right    BASELINE SCORE (UNILATERAL) 3.3           The patient was assessed using the L-Dex machine today to produce a lymphedema index baseline score. The patient will be reassessed on a regular basis (typically every 3 months) to obtain new L-Dex scores. If the score is > 6.5 points away from his/her baseline score indicating onset of subclinical lymphedema, it will be recommended to wear a compression garment for 4 weeks, 12 hours per day and then be reassessed. If the score continues to be > 6.5 points from baseline at reassessment, we will initiate lymphedema treatment. Assessing in this manner has a 95% rate of preventing clinically significant lymphedema.      Katina Dung - 10/09/19 0001    Open a tight or new jar No difficulty    Do heavy household chores (wash walls, wash floors) No difficulty    Carry a shopping bag or briefcase No difficulty    Wash your back No difficulty    Use a knife to cut food No difficulty    Recreational activities in which you take some force or impact through your arm, shoulder, or hand (golf, hammering, tennis) No difficulty    During the past week, to what extent has your arm, shoulder or hand problem interfered with your normal social activities with family, friends, neighbors, or groups? Not at all    During the past week, to what extent has your arm, shoulder or hand problem limited your work or other regular daily activities Not at all    Arm, shoulder, or hand pain. None    Tingling (  pins and needles)  in your arm, shoulder, or hand None    Difficulty Sleeping No difficulty    DASH Score 0 %            Objective measurements completed on examination: See above findings.        Patient was instructed today in a home exercise program today for post op shoulder range of motion. These included active assist shoulder flexion in sitting, scapular retraction, wall walking with shoulder abduction, and hands behind head external rotation.  She was encouraged to do these twice a day, holding 3 seconds and repeating 5 times when permitted by her physician.           PT Education - 10/09/19 1308    Education Details Lymphedema risk reduction and post op shoulder ROM HEP    Person(s) Educated Patient;Spouse    Methods Explanation;Demonstration;Handout    Comprehension Returned demonstration;Verbalized understanding               PT Long Term Goals - 10/09/19 1314      PT LONG TERM GOAL #1   Title Patient will demonstrate she has regained full shoulder ROM and function post op compared to baselines.    Time 6    Period Months    Target Date 04/07/20           Breast Clinic Goals - 10/09/19 1314      Patient will be able to verbalize understanding of pertinent lymphedema risk reduction practices relevant to her diagnosis specifically related to skin care.   Time 1    Period Days    Status Achieved      Patient will be able to return demonstrate and/or verbalize understanding of the post-op home exercise program related to regaining shoulder range of motion.   Time 1    Period Days    Status Achieved      Patient will be able to verbalize understanding of the importance of attending the postoperative After Breast Cancer Class for further lymphedema risk reduction education and therapeutic exercise.   Time 1    Period Days    Status Achieved                 Plan - 10/09/19 1310    Clinical Impression Statement Patient was diagnosed on 09/30/2019 with left  grade III triple negative invasive ductal carcinoma breast cancer. It measures 9 mm and 1.4 cm both located in the upper outer quadrant. Ki67 is 90%. Her multidisciplinary medical team met prior to her assessments to determine a recommended treatment plan. She is planning to have neoadjuvant chemotherapy and then a left lumpectomy and sentinel node biopsy followed by radiation. She will benefit from a post op PT reassessment to determine needs and from L-Dex screens every 3 months for 2 years to detect subclinical lymphedema.    Stability/Clinical Decision Making Stable/Uncomplicated    Clinical Decision Making Low    Rehab Potential Excellent    PT Frequency --   Eval and 1 f/u visit   PT Treatment/Interventions ADLs/Self Care Home Management;Therapeutic exercise    PT Next Visit Plan Will reassess 3-4 weeks post op to determine needs    PT Home Exercise Plan Post op shoulder ROM HEP    Consulted and Agree with Plan of Care Patient;Family member/caregiver    Family Member Consulted Husband           Patient will benefit from skilled therapeutic intervention in order to improve the  following deficits and impairments:  Postural dysfunction, Decreased range of motion, Decreased knowledge of precautions, Impaired UE functional use, Pain  Visit Diagnosis: Malignant neoplasm of upper-outer quadrant of left breast in female, estrogen receptor negative ([place]) - Plan: PT plan of care cert/re-cert  Abnormal posture - Plan: PT plan of care cert/re-cert   Patient will follow up at outpatient cancer rehab 3-4 weeks following surgery.  If the patient requires physical therapy at that time, a specific plan will be dictated and sent to the referring physician for approval. The patient was educated today on appropriate basic range of motion exercises to begin post operatively and the importance of attending the After Breast Cancer class following surgery.  Patient was educated today on lymphedema risk  reduction practices as it pertains to recommendations that will benefit the patient immediately following surgery.  She verbalized good understanding.      Problem List Patient Active Problem List   Diagnosis Date Noted  . Malignant neoplasm of upper-outer quadrant of left breast in female, estrogen receptor negative ([place]) 10/03/2019  . Unspecified arthropathy, ankle and foot 09/24/2009  . HALLUX RIGIDUS 09/24/2009   Annia Friendly, PT 10/09/19 1:21 PM  [place], [place], [address] Phone: [phone]   Fax:  [phone]  Name: CANDELA KRUL MRN: [id number] Date of Birth: 1950/07/12

## 2019-10-09 NOTE — Progress Notes (Signed)
REFERRING PROVIDER: Chauncey Cruel, MD [address] [place],  [place] [address]  PRIMARY PROVIDER:  Benito Mccreedy, MD  PRIMARY REASON FOR VISIT:  1. Malignant neoplasm of upper-outer quadrant of left breast in female, estrogen receptor negative ([place])   2. Family history of breast cancer    I connected with [name] on 10/09/2019 at 11am EDT by Webex video conference and verified that I am speaking with the correct person using two identifiers.   HISTORY OF PRESENT ILLNESS:   [name], a 69 y.o. female, was seen for a [facility] cancer genetics consultation during multidisciplinary clinic at the request of Dr. Jana Hakim due to a personal and family history of breast cancer.  [name] presents to clinic today to discuss the possibility of a hereditary predisposition to cancer, genetic testing, and to further clarify her future cancer risks, as well as potential cancer risks for family members.   In September 2021, at the age of 68, 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 The preliminary treatment plan neoadjuvant chemotherapy, lumpectomy, and adjuvant radiation.   CANCER HISTORY:  Oncology History  Malignant neoplasm of upper-outer quadrant of left breast in female, estrogen receptor negative ([place])  10/03/2019 Initial Diagnosis   Malignant neoplasm of upper-outer quadrant of left breast in female, estrogen receptor negative ([place])   10/22/2019 -  Chemotherapy   The patient had dexamethasone (DECADRON) 4 MG tablet, 0 of 1 cycle, Start date: 10/09/2019, End date: -- DOXOrubicin (ADRIAMYCIN) chemo injection 116 mg, 60 mg/m2, Intravenous,  Once, 0 of 4 cycles PALONOSETRON HCL INJECTION 0.25 MG/5ML, 0.25 mg, Intravenous,  Once, 0 of 8 cycles pegfilgrastim-jmdb (FULPHILA) injection 6 mg, 6 mg, Subcutaneous,  Once, 0 of 4 cycles CARBOplatin (PARAPLATIN) in sodium chloride 0.9 % 100 mL chemo infusion, , Intravenous,  Once,  0 of 4 cycles cyclophosphamide (CYTOXAN) 1,160 mg in sodium chloride 0.9 % 250 mL chemo infusion, 600 mg/m2, Intravenous,  Once, 0 of 4 cycles PACLitaxel (TAXOL) 156 mg in sodium chloride 0.9 % 250 mL chemo infusion (</= 21m/m2), 80 mg/m2, Intravenous,  Once, 0 of 4 cycles FOSAPREPITANT IV INFUSION 150 MG, 150 mg, Intravenous,  Once, 0 of 8 cycles  for chemotherapy treatment.      RISK FACTORS:  Menarche was at age 69  First live birth at age 45  OCP use for approximately 20 years.  Ovaries intact: yes.  Hysterectomy: yes in 1990 Menopausal status: postmenopausal.  HRT use: 0 years. Colonoscopy: yes; most recent colonoscopy in 2018 per patient. Mammogram within the last year: yes. Up to date with pelvic exams: most recent PAP 8 years ago per patient  Past Medical History:  Diagnosis Date  . [place]   . Obesity   . Sleep apnea    Past Surgical History:  Procedure Laterality Date  . ABDOMINAL HYSTERECTOMY     Social History   Socioeconomic History  . Marital status: Married    Spouse name: Not on file  . Number of children: Not on file  . Years of education: Not on file  . Highest education level: Not on file  Occupational History  . Not on file  Tobacco Use  . Smoking status: Never Smoker  . Smokeless tobacco: Never Used  Substance and Sexual Activity  . Alcohol use: Never  . Drug use: Never  . Sexual activity: Not on file  Other Topics Concern  . Not on file  Social History Narrative  .  Not on file   Social Determinants of Health   Financial Resource Strain:   . Difficulty of Paying Living Expenses: Not on file  Food Insecurity:   . Worried About Charity fundraiser in the Last Year: Not on file  . Ran Out of Food in the Last Year: Not on file  Transportation Needs:   . Lack of Transportation (Medical): Not on file  . Lack of Transportation (Non-Medical): Not on file  Physical Activity:   . Days of Exercise per Week: Not on file  .  Minutes of Exercise per Session: Not on file  Stress:   . Feeling of Stress : Not on file  Social Connections:   . Frequency of Communication with Friends and Family: Not on file  . Frequency of Social Gatherings with Friends and Family: Not on file  . Attends Religious Services: Not on file  . Active Member of Clubs or Organizations: Not on file  . Attends Archivist Meetings: Not on file  . Marital Status: Not on file     FAMILY HISTORY:  We obtained a detailed, 4-generation family history.  Significant diagnoses are listed below: Family History  Problem Relation Age of Onset  . Breast cancer Cousin 21       maternal cousin; bilateral     Cheyenne Mcdonald has one daughter, age 38, and one son, age 9, both without a cancer history.  [name] has two brothers without a cancer history.  One brother is living at age 65, and her other brother passed away at 2.  [name] mother is 8 years old.  [name] has one maternal cousin, now in her mid 45s, who was diagnosed with bilateral breast cancer around the age of 34. No other maternal family history of cancer was reported. [name] biological father passed away in his 78s and did not have cancer.  She has limited information about her biological father's family.   [name] is unaware of previous family history of genetic testing for hereditary cancer risks. Patient's maternal ancestors are of African American descent, and paternal ancestors are of African American descent. There is no reported Ashkenazi Jewish ancestry. There is no known consanguinity.  GENETIC COUNSELING ASSESSMENT: [name] is a 69 y.o. female with a personal and family history of breast cancer which is somewhat suggestive of a hereditary cancer syndrome and predisposition to cancer given her triple negative breast cancer and the age at which her cousin was diagnosed with breast cancer. We, therefore, discussed and recommended the following at  today's visit.   DISCUSSION: We discussed that 5 - 10% of cancer is hereditary, with most cases of hereditary breast cancer associated with mutations in BRCA1 and BRCA2.  There are other genes that can be associated with hereditary breast cancer syndromes.  The type of cancer risk and level of risk are gene-specific.  We discussed that testing is beneficial for several reasons including knowing how to follow individuals after completing their treatment, identifying whether potential treatment options would be beneficial, and understanding if other family members could be at risk for cancer and allowing them to undergo genetic testing.   We reviewed the characteristics, features and inheritance patterns of hereditary cancer syndromes. We also discussed genetic testing, including the appropriate family members to test, the process of testing, insurance coverage and turn-around-time for results. We discussed the implications of a negative, positive, carrier and/or variant of uncertain significant result. We recommended [name] pursue genetic  testing for a panel that includes genes associated with breast cancer.   [name]  was offered a common hereditary cancer panel (48 genes) and an expanded pan-cancer panel (85 genes). [name] was informed of the benefits and limitations of each panel, including that expanded pan-cancer panels contain several preliminary evidence genes that do not have clear management guidelines at this point in time.  We also discussed that as the number of genes included on a panel increases, the chances of variants of uncertain significance increases.  After considering the benefits and limitations of each gene panel, [name]  elected to have a common hereditary cancers panel through [facility].  The Common Hereditary Cancers Panel offered by [facility] includes sequencing and/or deletion duplication testing of the following 48 genes: APC, ATM, AXIN2, BARD1, BMPR1A, BRCA1,  BRCA2, BRIP1, CDH1, CDK4, CDKN2A (p14ARF), CDKN2A (p16INK4a), CHEK2, CTNNA1, DICER1, EPCAM (Deletion/duplication testing only), GREM1 (promoter region deletion/duplication testing only), KIT, MEN1, MLH1, MSH2, MSH3, MSH6, MUTYH, NBN, NF1, NHTL1, PALB2, PDGFRA, PMS2, POLD1, POLE, PTEN, RAD50, RAD51C, RAD51D, RNF43, SDHB, SDHC, SDHD, SMAD4, SMARCA4. STK11, TP53, TSC1, TSC2, and VHL.  The following genes were evaluated for sequence changes only: SDHA and HOXB13 c.251G>A variant only.  Based on [name]'s personal history of triple negative breast cancer and family history of breast cancer in a relative before the age of 54, she meets NCCN medical criteria for genetic testing. Despite that she meets criteria, she may still have an out of pocket cost. We discussed that if her out of pocket cost for testing is over $100, the laboratory will call and confirm whether she wants to proceed with testing.  If the out of pocket cost of testing is less than $100 she will be billed by the genetic testing laboratory.   PLAN: After considering the risks, benefits, and limitations, [name] provided informed consent to pursue genetic testing and the blood sample was sent to [facility] for analysis of the Common Hereditary Cancers Panel. Results should be available within approximately 3 weeks' time, at which point they will be disclosed by telephone to [name], as will any additional recommendations warranted by these results. [name] will receive a summary of her genetic counseling visit and a copy of her results once available. This information will also be available in Epic.   Based on [name]'s family history, we recommended her maternal cousin, who was diagnosed with breast cancer at age 33, have genetic counseling and testing. [name] will let us know if we can be of any assistance in coordinating genetic counseling and/or testing for this family member.   Lastly, we encouraged Ms.  Cheyenne Mcdonald to remain in contact with cancer genetics annually so that we can continuously update the family history and inform her of any changes in cancer genetics and testing that may be of benefit for this family.   [name] questions were answered to her satisfaction today. Our contact information was provided should additional questions or concerns arise. Thank you for the referral and allowing [place] to share in the care of your patient.   Jimi Giza M. Joette Catching, [place], [facility] Certified Film/video [email or web address] (P) [phone]  The patient was seen for a total of 20 minutes in face-to-face genetic counseling.  This patient was discussed with Drs. Magrinat, Lindi Adie and/or Burr Medico who agrees with the above.   _______________________________________________________________________ For Office Staff:  Number of people involved in session: 1 Was an Intern/ student involved with case: no

## 2019-10-09 NOTE — Patient Instructions (Signed)

## 2019-10-09 NOTE — Progress Notes (Signed)
Clinical Social Work [facility] Psychosocial Distress Screening [place]   Patient completed distress screening protocol and scored a 2 on the Psychosocial Distress Thermometer which indicates mild distress. Clinical Education officer, museum met with patient and patient's Spouse, [name], in [facility] to assess for distress and other psychosocial needs.  Patient stated felt better and is ready to get started after meeting with the treatment team and getting more information on her treatment plan.    Transportation: no concerns Food access: no concerns Housing/ utilities: no concerns Financial: no concerns. 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 Both have retirement income. Able to access resources through the [place] as well  Support: strong support network of family, friends, and church community, some of whom also had breast cancer. Coping: journals, relies on her faith  CSW and patient discussed common feeling and emotions when being diagnosed with cancer, and the importance of support during treatment.  CSW informed patient of the support team and support services at [facility].  CSW provided contact information and encouraged patient to call with any questions or concerns.   Distress Screen: ONCBCN DISTRESS SCREENING 10/09/2019  Screening Type Initial Screening  Distress experienced in past week (1-10) 2  Emotional problem type Nervousness/Anxiety  Information Concerns Type Lack of info about diagnosis;Lack of info about treatment     [facility] [place]

## 2019-10-10 ENCOUNTER — Encounter: Payer: Self-pay | Admitting: *Deleted

## 2019-10-10 ENCOUNTER — Telehealth: Payer: Self-pay | Admitting: Oncology

## 2019-10-10 ENCOUNTER — Telehealth: Payer: Self-pay | Admitting: *Deleted

## 2019-10-10 ENCOUNTER — Other Ambulatory Visit: Payer: Self-pay | Admitting: *Deleted

## 2019-10-10 DIAGNOSIS — R7303 Prediabetes: Secondary | ICD-10-CM | POA: Diagnosis not present

## 2019-10-10 DIAGNOSIS — G4733 Obstructive sleep apnea (adult) (pediatric): Secondary | ICD-10-CM | POA: Diagnosis not present

## 2019-10-10 DIAGNOSIS — C50412 Malignant neoplasm of upper-outer quadrant of left female breast: Secondary | ICD-10-CM

## 2019-10-10 DIAGNOSIS — Z171 Estrogen receptor negative status [ER-]: Secondary | ICD-10-CM

## 2019-10-10 DIAGNOSIS — E782 Mixed hyperlipidemia: Secondary | ICD-10-CM | POA: Diagnosis not present

## 2019-10-10 DIAGNOSIS — D0510 Intraductal carcinoma in situ of unspecified breast: Secondary | ICD-10-CM | POA: Diagnosis not present

## 2019-10-10 DIAGNOSIS — E559 Vitamin D deficiency, unspecified: Secondary | ICD-10-CM | POA: Diagnosis not present

## 2019-10-10 DIAGNOSIS — M1711 Unilateral primary osteoarthritis, right knee: Secondary | ICD-10-CM | POA: Diagnosis not present

## 2019-10-10 DIAGNOSIS — E669 Obesity, unspecified: Secondary | ICD-10-CM | POA: Diagnosis not present

## 2019-10-10 DIAGNOSIS — N182 Chronic kidney disease, stage 2 (mild): Secondary | ICD-10-CM | POA: Diagnosis not present

## 2019-10-10 NOTE — Telephone Encounter (Signed)
Scheduled appts per 9/29 los. Pt confirmed appt dates and time. Pt to get appt calendar at next visit per appt notes.

## 2019-10-10 NOTE — Telephone Encounter (Signed)
Spoke to pt concerning Cheyenne Mcdonald from 10/09/19. Denies questions or concerns regarding dx or treatment care plan. Discussed port placement with IR on 10/18/19 arrive at 10am, NPO after midnight Confirmed appt with Dr. Jana Hakim at [place] on 10/18/19 Encourage pt to call with needs. Received verbal understanding.

## 2019-10-15 DIAGNOSIS — Z23 Encounter for immunization: Secondary | ICD-10-CM | POA: Diagnosis not present

## 2019-10-16 NOTE — Progress Notes (Signed)
Pharmacist Chemotherapy Monitoring - Initial Assessment    Anticipated start date: 10/22/2019   Regimen:  . Are orders appropriate based on the patient's diagnosis, regimen, and cycle? Yes . Does the plan date match the patient's scheduled date? Yes . Is the sequencing of drugs appropriate? Yes . Are the premedications appropriate for the patient's regimen? Yes . Prior Authorization for treatment is: Pending o If applicable, is the correct biosimilar selected based on the patient's insurance? not applicable  Organ Function and Labs: Marland Kitchen Are dose adjustments needed based on the patient's renal function, hepatic function, or hematologic function? Yes . Are appropriate labs ordered prior to the start of patient's treatment? Yes . Other organ system assessment, if indicated: anthracyclines: Echo/ MUGA . The following baseline labs, if indicated, have been ordered: N/A  Dose Assessment: . Are the drug doses appropriate? Yes . Are the following correct: o Drug concentrations Yes o IV fluid compatible with drug Yes o Administration routes Yes o Timing of therapy Yes . If applicable, does the patient have documented access for treatment and/or plans for port-a-cath placement? yes . If applicable, have lifetime cumulative doses been properly documented and assessed? no Lifetime Dose Tracking  No doses have been documented on this patient for the following tracked chemicals: Doxorubicin, Epirubicin, Idarubicin, Daunorubicin, Mitoxantrone, Bleomycin, Oxaliplatin, Carboplatin, Liposomal Doxorubicin  o   Toxicity Monitoring/Prevention: . The patient has the following take home antiemetics prescribed: Prochlorperazine . The patient has the following take home medications prescribed: N/A . Medication allergies and previous infusion related reactions, if applicable, have been reviewed and addressed. No . The patient's current medication list has been assessed for drug-drug interactions with their  chemotherapy regimen. no significant drug-drug interactions were identified on review.  Order Review: . Are the treatment plan orders signed? No . Is the patient scheduled to see a provider prior to their treatment? Yes  I verify that I have reviewed each item in the above checklist and answered each question accordingly.  Jabir Dahlem D 10/16/2019 8:39 AM

## 2019-10-17 ENCOUNTER — Ambulatory Visit (HOSPITAL_COMMUNITY): Payer: Medicare HMO

## 2019-10-17 ENCOUNTER — Inpatient Hospital Stay: Payer: Medicare HMO | Attending: Oncology

## 2019-10-17 ENCOUNTER — Other Ambulatory Visit: Payer: Self-pay | Admitting: Radiology

## 2019-10-17 ENCOUNTER — Ambulatory Visit (HOSPITAL_COMMUNITY)
Admission: RE | Admit: 2019-10-17 | Discharge: 2019-10-17 | Disposition: A | Discharge status: Home / Self Care | Payer: Medicare HMO | Source: Ambulatory Visit | Attending: Oncology | Admitting: Oncology

## 2019-10-17 ENCOUNTER — Other Ambulatory Visit: Payer: Self-pay | Admitting: Student

## 2019-10-17 ENCOUNTER — Other Ambulatory Visit: Payer: Self-pay

## 2019-10-17 ENCOUNTER — Encounter: Payer: Self-pay | Admitting: *Deleted

## 2019-10-17 DIAGNOSIS — Z79899 Other long term (current) drug therapy: Secondary | ICD-10-CM | POA: Insufficient documentation

## 2019-10-17 DIAGNOSIS — Z5189 Encounter for other specified aftercare: Secondary | ICD-10-CM | POA: Insufficient documentation

## 2019-10-17 DIAGNOSIS — Z0189 Encounter for other specified special examinations: Secondary | ICD-10-CM

## 2019-10-17 DIAGNOSIS — Z791 Long term (current) use of non-steroidal anti-inflammatories (NSAID): Secondary | ICD-10-CM | POA: Insufficient documentation

## 2019-10-17 DIAGNOSIS — Z171 Estrogen receptor negative status [ER-]: Secondary | ICD-10-CM

## 2019-10-17 DIAGNOSIS — Z803 Family history of malignant neoplasm of breast: Secondary | ICD-10-CM | POA: Diagnosis not present

## 2019-10-17 DIAGNOSIS — Z7689 Persons encountering health services in other specified circumstances: Secondary | ICD-10-CM | POA: Insufficient documentation

## 2019-10-17 DIAGNOSIS — G4730 Sleep apnea, unspecified: Secondary | ICD-10-CM | POA: Diagnosis not present

## 2019-10-17 DIAGNOSIS — Z5111 Encounter for antineoplastic chemotherapy: Secondary | ICD-10-CM | POA: Insufficient documentation

## 2019-10-17 DIAGNOSIS — C50412 Malignant neoplasm of upper-outer quadrant of left female breast: Secondary | ICD-10-CM

## 2019-10-17 DIAGNOSIS — C50912 Malignant neoplasm of unspecified site of left female breast: Secondary | ICD-10-CM | POA: Insufficient documentation

## 2019-10-17 DIAGNOSIS — E669 Obesity, unspecified: Secondary | ICD-10-CM | POA: Insufficient documentation

## 2019-10-17 DIAGNOSIS — Z23 Encounter for immunization: Secondary | ICD-10-CM | POA: Insufficient documentation

## 2019-10-17 LAB — ECHOCARDIOGRAM COMPLETE
Area-P 1/2: 2.73 cm2
S' Lateral: 3 cm

## 2019-10-17 NOTE — Progress Notes (Signed)
Cheyenne Mcdonald  Telephone:([phone]) [phone] Fax:(336) [phone]     ID: XOCHILTH STANDISH DOB: 1950/10/18  MR#: [id number]  AXE#:[id number]  Patient Care Team: Benito Mccreedy, MD as PCP - General (Internal Medicine) Servando Salina, MD as Consulting Physician (Obstetrics and Gynecology) Mauro Kaufmann, RN as Oncology Nurse Navigator Rockwell Germany, RN as Oncology Nurse Navigator [name], MD as Consulting Physician (General Surgery) Magrinat, Virgie Dad, MD as Consulting Physician (Oncology) Gery Pray, MD as Consulting Physician (Radiation Oncology) Dimas Aguas, MD as Consulting Physician (Nephrology) Chauncey Cruel, MD OTHER MD:  CHIEF COMPLAINT: triple negative breast cancer  CURRENT TREATMENT: Neoadjuvant chemotherapy   INTERVAL HISTORY: Cheyenne Mcdonald returns today for follow up of her triple negative breast cancer. She was evaluated in the multidisciplinary breast cancer clinic on 10/09/2019.  She underwent echocardiogram yesterday, 10/17/2019, showing an ejection fraction of 60-65%.  She underwent port placement earlier today in anticipation of beginning neoadjuvant chemotherapy on 10/22/2019. Her chemotherapy will consist of doxorubicin and cyclophosphamide in dose dense fashion x4, to be followed by weekly carboplatin and paclitaxel x12.  She is scheduled for breast MRI on 10/21/2019.   REVIEW OF SYSTEMS: Cheyenne Mcdonald is delighted at how well the port placement went.  She has had absolutely no pain from that.  She met with our chemotherapy teaching nurse and has a very good understanding noted only of side effects but also how to prevent that anticipate them.  She was going to receive her Covid booster today, but she is scheduled for an MRI of the breast next week and we do not want her to have some lymph node hyperplasia secondary to a shot so we are waiting on that for a week.  Otherwise she feels "terrific" and has the best possible attitude towards her  diagnosis and treatment ("it is an opportunity to increase people's faith").   HISTORY OF CURRENT ILLNESS: From the original intake note:  Cheyenne Mcdonald presented for her routine mammography with a palpable left breast lump. She underwent bilateral diagnostic mammography with tomography and left breast ultrasonography at [facility] on 09/13/2019 showing: breast density category B; two adjacent masses in left breast at 1 o'clock, spanning approximately 3.3 cm; one indeterminate left axillary lymph node with 0.4 cm cortical bulge; no evidence right breast malignancy.  Accordingly on 09/27/2019 she proceeded to biopsy of the left breast areas in question. The pathology from this procedure ([id number]) showed: invasive ductal carcinoma, grade 3, present in both of the adjacent masses. Prognostic indicators significant for: estrogen receptor, 0% negative and progesterone receptor, 0% negative. Proliferation marker Ki67 at 90%. HER2 equivocal by immunohistochemistry (2+), but negative by fluorescent in situ hybridization with a signals ratio 2.2 and number per cell 3.3. Additional analysis of more cells showed a signals ratio 2.05 and number per cell 3.13.  The biopsied lymph node was negative for carcinoma.  This was felt to be concordant  The patient's subsequent history is as detailed below.   PAST MEDICAL HISTORY: Past Medical History:  Diagnosis Date  . Breast cancer ([place])   . Obesity   . Sleep apnea   heart murmur (since childhood)   PAST SURGICAL HISTORY: Past Surgical History:  Procedure Laterality Date  . ABDOMINAL HYSTERECTOMY    . IR IMAGING GUIDED PORT INSERTION  10/18/2019    FAMILY HISTORY: Family History  Problem Relation Age of Onset  . Breast cancer Cousin 44       maternal cousin; bilateral  Her father died at age 101, cause of death unknown. Her mother is age 67 as of 09/2019. Cheyenne Mcdonald has two brothers (and no sisters). She reports one cousin with breast cancer at  age 26.   GYNECOLOGIC HISTORY:  No LMP recorded. Patient has had a hysterectomy. Menarche: 69 years old Age at first live birth: 69 years old [place] P 2 LMP 1990 Contraceptive: used for maybe 20 years HRT: never used  Hysterectomy? Yes, 1990 BSO? 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 Husband [name] is retired Nature conservation officer and then retired Civil Service fast streamer.  At home is just the 2 of them. Daughter [name], age 34, works in [employer] entry in [employer]. Son [name], age 35, has a college degree but works for [employer] [place] [employer]. Ziare has four grandchildren. She attends [employer].    ADVANCED DIRECTIVES: In place   HEALTH MAINTENANCE: Social History   Tobacco Use  . Smoking status: Never Smoker  . Smokeless tobacco: Never Used  Substance Use Topics  . Alcohol use: Never  . Drug use: Never     Colonoscopy: 2018 (Dr. Collene Mares)  PAP: approx. 2013  Bone density: 2016, "normal"   Allergies  Allergen Reactions  . Other Itching  . Shellfish Allergy Itching    Current Outpatient Medications  Medication Sig Dispense Refill  . Cholecalciferol 125 MCG (5000 UT) capsule Take by mouth.    . dexamethasone (DECADRON) 4 MG tablet Take 2 tablets (8 mg) by mouth twice daily days 2 and 3 after chemo, then take one last dose the morning of day 4; take with food 30 tablet 1  . diclofenac sodium (VOLTAREN) 1 % GEL Apply 1 application topically 4 (four) times daily.      Marland Kitchen lidocaine-prilocaine (EMLA) cream Apply to affected area once 30 g 3  . loratadine (CLARITIN) 10 MG tablet Take 1 tablet (10 mg total) by mouth daily. 40 tablet 0  . LORazepam (ATIVAN) 0.5 MG tablet Take 1 tablet (0.5 mg total) by mouth at bedtime as needed (Nausea or vomiting). 20 tablet 0  . meloxicam (MOBIC) 15 MG tablet Take 15 mg by mouth daily. With food     . Naltrexone-buPROPion HCl ER (CONTRAVE) 8-90 MG TB12     . prochlorperazine (COMPAZINE) 10 MG  tablet Take 1 tablet (10 mg total) by mouth every 6 (six) hours as needed (Nausea or vomiting). 30 tablet 1   No current facility-administered medications for this visit.   Facility-Administered Medications Ordered in Other Visits  Medication Dose Route Frequency Provider Last Rate Last Admin  . 0.9 %  sodium chloride infusion   Intravenous Continuous Docia Barrier, PA   Stopped at 10/18/19 1250  . fentaNYL (SUBLIMAZE) 100 MCG/2ML injection           . heparin lock flush 100 UNIT/ML injection           . lidocaine-EPINEPHrine (XYLOCAINE W/EPI) 1 %-1:100000 (with pres) injection           . midazolam (VERSED) 2 MG/2ML injection             OBJECTIVE: African-American woman in no acute distress  Vitals:   10/18/19 1312  BP: 99/88  Pulse: 71  Resp: 18  Temp: 97.9 F (36.6 C)  SpO2: 100%     Body mass index is 32.34 kg/m.   Wt Readings from Last 3 Encounters:  10/18/19 185 lb 8 oz (  84.1 kg)  10/09/19 183 lb 12.8 oz (83.4 kg)      ECOG FS:1 - Symptomatic but completely ambulatory  Sclerae unicteric, EOMs intact Wearing a mask No cervical or supraclavicular adenopathy Lungs no rales or rhonchi Heart regular rate and rhythm Abd soft, nontender, positive bowel sounds MSK no focal spinal tenderness, no upper extremity lymphedema Neuro: nonfocal, well oriented, appropriate affect Breasts: The right breast is benign.  The left breast shows a mass in the upper outer quadrant which is not well-defined, movable, does not affect the skin or nipple.  Both axillae are benign.   LAB RESULTS:  CMP     Component Value Date/Time   NA 140 10/09/2019 0806   K 3.9 10/09/2019 0806   CL 107 10/09/2019 0806   CO2 25 10/09/2019 0806   GLUCOSE 95 10/09/2019 0806   BUN 22 10/09/2019 0806   CREATININE 1.11 (H) 10/09/2019 0806   CALCIUM 9.2 10/09/2019 0806   PROT 7.4 10/09/2019 0806   ALBUMIN 4.0 10/09/2019 0806   AST 18 10/09/2019 0806   ALT 17 10/09/2019 0806   ALKPHOS 77  10/09/2019 0806   BILITOT 0.3 10/09/2019 0806   GFRNONAA 50 (L) 10/09/2019 0806   GFRAA 58 (L) 10/09/2019 0806    No results found for: TOTALPROTELP, ALBUMINELP, A1GS, A2GS, BETS, BETA2SER, GAMS, MSPIKE, SPEI  Lab Results  Component Value Date   WBC 5.4 10/18/2019   NEUTROABS 3.2 10/18/2019   HGB 13.7 10/18/2019   HCT 41.5 10/18/2019   MCV 93.3 10/18/2019   PLT 181 10/18/2019    No results found for: LABCA2  No components found for: EYCXKG818  Recent Labs  Lab 10/18/19 1005  INR 1.0    No results found for: LABCA2  No results found for: HUD149  No results found for: FWY637  No results found for: CHY850  No results found for: CA2729  No components found for: HGQUANT  No results found for: CEA1 / No results found for: CEA1   No results found for: AFPTUMOR  No results found for: CHROMOGRNA  No results found for: KPAFRELGTCHN, LAMBDASER, KAPLAMBRATIO (kappa/lambda light chains)  No results found for: HGBA, HGBA2QUANT, HGBFQUANT, HGBSQUAN (Hemoglobinopathy evaluation)   No results found for: LDH  No results found for: IRON, TIBC, IRONPCTSAT (Iron and TIBC)  No results found for: FERRITIN  Urinalysis No results found for: COLORURINE, APPEARANCEUR, LABSPEC, PHURINE, GLUCOSEU, HGBUR, BILIRUBINUR, KETONESUR, PROTEINUR, UROBILINOGEN, NITRITE, LEUKOCYTESUR   STUDIES: ECHOCARDIOGRAM COMPLETE  Result Date: 10/17/2019    ECHOCARDIOGRAM REPORT   Patient Name:   Cheyenne Mcdonald Date of Exam: 10/17/2019 Medical Rec #:  [id number]      Height:       63.5 in Accession #:    [id number]     Weight:       183.8 lb Date of Birth:  16-Jul-1950       BSA:          1.876 m Patient Age:    37 years       BP:           134/79 mmHg Patient Gender: F              HR:           56 bpm. Exam Location:  Outpatient Procedure: 2D Echo, Cardiac Doppler, Color Doppler, Strain Analysis and 3D Echo Indications:    Z51.11 Encounter for antineoplastic chemotheraphy  History:        Patient has no  prior  history of Echocardiogram examinations.                 Risk Factors:Sleep Apnea.  Sonographer:    Jonelle Sidle Dance Referring Phys: [place]  1. Left ventricular ejection fraction, by estimation, is 60 to 65%. The left ventricle has normal function. The left ventricle has no regional wall motion abnormalities. Left ventricular diastolic parameters are consistent with Grade I diastolic dysfunction (impaired relaxation).  2. Right ventricular systolic function is normal. The right ventricular size is normal.  3. The mitral valve is normal in structure. No evidence of mitral valve regurgitation. No evidence of mitral stenosis.  4. The aortic valve is tricuspid. There is mild thickening of the aortic valve. Aortic valve regurgitation is not visualized.  5. The inferior vena cava is normal in size with greater than 50% respiratory variability, suggesting right atrial pressure of 3 mmHg. Comparison(s): No prior Echocardiogram. FINDINGS  Left Ventricle: Left ventricular ejection fraction, by estimation, is 60 to 65%. The left ventricle has normal function. The left ventricle has no regional wall motion abnormalities. The left ventricular internal cavity size was normal in size. There is  no left ventricular hypertrophy. Left ventricular diastolic parameters are consistent with Grade I diastolic dysfunction (impaired relaxation). Right Ventricle: The right ventricular size is normal. No increase in right ventricular wall thickness. Right ventricular systolic function is normal. Left Atrium: Left atrial size was normal in size. Right Atrium: Right atrial size was normal in size. Pericardium: There is no evidence of pericardial effusion. Mitral Valve: The mitral valve is normal in structure. There is mild thickening of the mitral valve leaflet(s). Mild mitral annular calcification. No evidence of mitral valve regurgitation. No evidence of mitral valve stenosis. Tricuspid Valve: The tricuspid valve  is normal in structure. Tricuspid valve regurgitation is trivial. Aortic Valve: The aortic valve is tricuspid. There is mild thickening of the aortic valve. Aortic valve regurgitation is not visualized. Pulmonic Valve: The pulmonic valve was normal in structure. Pulmonic valve regurgitation is trivial. Aorta: The aortic root and ascending aorta are structurally normal, with no evidence of dilitation. Venous: The inferior vena cava is normal in size with greater than 50% respiratory variability, suggesting right atrial pressure of 3 mmHg. IAS/Shunts: No atrial level shunt detected by color flow Doppler.  LEFT VENTRICLE PLAX 2D LVIDd:         4.70 cm  Diastology LVIDs:         3.00 cm  LV e' medial:    6.20 cm/s LV PW:         0.90 cm  LV E/e' medial:  14.3 LV IVS:        1.10 cm  LV e' lateral:   7.94 cm/s LVOT diam:     2.00 cm  LV E/e' lateral: 11.2 LV SV:         79 LV SV Index:   42 LVOT Area:     3.14 cm                          3D Volume EF:                         3D EF:        66 %                         LV EDV:  119 ml                         LV ESV:       41 ml                         LV SV:        78 ml RIGHT VENTRICLE             IVC RV Basal diam:  2.50 cm     IVC diam: 1.40 cm RV S prime:     12.20 cm/s TAPSE (M-mode): 2.0 cm LEFT ATRIUM             Index       RIGHT ATRIUM           Index LA diam:        4.30 cm 2.29 cm/m  RA Area:     11.50 cm LA Vol (A2C):   52.4 ml 27.93 ml/m RA Volume:   21.80 ml  11.62 ml/m LA Vol (A4C):   35.3 ml 18.81 ml/m LA Biplane Vol: 44.4 ml 23.66 ml/m  AORTIC VALVE LVOT Vmax:   106.00 cm/s LVOT Vmean:  69.200 cm/s LVOT VTI:    0.252 m  AORTA Ao Root diam: 3.20 cm Ao Asc diam:  3.30 cm MITRAL VALVE MV Area (PHT): 2.73 cm     SHUNTS MV Decel Time: 278 msec     Systemic VTI:  0.25 m MV E velocity: 88.60 cm/s   Systemic Diam: 2.00 cm MV A velocity: 103.00 cm/s MV E/A ratio:  0.86 Gwyndolyn Kaufman MD Electronically signed by Gwyndolyn Kaufman MD Signature  Date/Time: 10/17/2019/10:20:32 AM    Final    [place] AXILLARY NODE CORE BIOPSY LEFT  Addendum Date: 09/30/2019   ADDENDUM REPORT: 09/30/2019 13:32 ADDENDUM: 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 This was found to be concordant by Dr. Hassan Rowan. 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 This was found to be concordant by Dr. Hassan Rowan. Pathology revealed LYMPH NODE TISSUE WITH NO METASTATIC CARCINOMA IDENTIFIED of the LEFT axilla. This was found to be concordant by Dr. Hassan Rowan. Pathology results were discussed with the patient by telephone. The patient reported doing well after the biopsies with tenderness at the sites. Post biopsy instructions and care were reviewed and questions were answered. The patient was encouraged to call [facility] for any additional concerns. My direct phone number was provided. The patient was referred to [facility] on October 09, 2019. Pathology results reported by Terie Purser, RN on 09/30/2019. Electronically Signed   By: Margarette Canada M.D.   On: 09/30/2019 13:32   Result Date: 09/30/2019 CLINICAL DATA:  69 year old female for tissue sampling of 1.4 cm UPPER-OUTER LEFT breast mass, 0.9 cm UPPER-OUTER LEFT breast mass and LEFT axillary lymph node with thickened cortex. EXAM: ULTRASOUND GUIDED LEFT BREAST CORE NEEDLE BIOPSY X 2 [place] AXILLARY NODE CORE BIOPSY LEFT COMPARISON:  Previous exam(s). PROCEDURE: I met with the patient and we discussed the procedure of ultrasound-guided biopsy, including benefits and alternatives. We discussed the high likelihood of a successful procedure. We discussed the risks of the procedure, including infection, bleeding, tissue injury, clip migration, and inadequate sampling. Informed written consent  was given. The  usual time-out protocol was performed immediately prior to the procedure. ULTRASOUND GUIDED LEFT BREAST CORE NEEDLE BIOPSY #1 (1.4 cm mass at the 1 o'clock position-RIBBON clip): Lesion quadrant: UPPER-OUTER LEFT breast Using sterile technique and 1% Lidocaine as local anesthetic, under direct ultrasound visualization, a 12 gauge spring-loaded device was used to perform biopsy of the 1.4 cm mass at the 1 o'clock position 6 cm from the nipple using a MEDIAL approach. At the conclusion of the procedure a RIBBON tissue marker clip was deployed into the biopsy cavity. Follow up 2 view mammogram was performed and dictated separately. ULTRASOUND-GUIDED LEFT BREAST CORE NEEDLE BIOPSY #2 (0.9 cm mass at the 1:30 o'clock position-COIL clip): Lesion quadrant: UPPER-OUTER LEFT breast Using sterile technique and 1% Lidocaine as local anesthetic, under direct ultrasound visualization, a 12 gauge spring-loaded device was used to perform biopsy of the 0.9 cm mass at the 1:30 position 6 cm from the nipple using a MEDIAL approach. At the conclusion of the procedure a COIL tissue marker clip was deployed into the biopsy cavity. Follow up 2 view mammogram was performed and dictated separately. ULTRASOUND GUIDED BIOPSY OF A LEFT AXILLARY LYMPH NODE (Q CLIP): Using sterile technique and 1% Lidocaine as local anesthetic, under direct ultrasound visualization, a 14 gauge spring-loaded device was used to perform biopsy of the LEFT axillary lymph node with focal cortical thickening using a MEDIAL approach. At the conclusion of the procedure a Q tissue marker clip was deployed into the biopsy cavity, with satisfactory placement confirmed sonographically. Follow up 2 view mammogram was performed and dictated separately. IMPRESSION: Ultrasound guided biopsy of a 1.4 cm UPPER-OUTER LEFT breast mass (RIBBON clip), a 0.9 cm UPPER-OUTER LEFT breast mass (COIL clip), and a LEFT axillary lymph node with focal cortical thickening (Q clip). No  apparent complications. Electronically Signed: By: Margarette Canada M.D. On: 09/27/2019 14:01   MM CLIP PLACEMENT LEFT  Result Date: 09/27/2019 CLINICAL DATA:  Evaluate RIBBON and COIL biopsy marker placement following ultrasound-guided LEFT breast biopsies, and Q biopsy marker placement following ultrasound-guided LEFT axillary biopsy. EXAM: DIAGNOSTIC LEFT MAMMOGRAM POST ULTRASOUND BIOPSY COMPARISON:  Previous exam(s). FINDINGS: Mammographic images were obtained following ultrasound guided biopsies of a 1.4 cm mass at the 1 o'clock position of the LEFT breast, a 0.9 cm mass at the 1:30 o'clock position of the LEFT breast and an abnormal LEFT axillary lymph node. The RIBBON biopsy marking clip is in expected position at the site of biopsy of the 1.4 cm UPPER-OUTER LEFT breast mass. The COIL biopsy marking clip is in expected position at the site of biopsy of the 0.9 cm UPPER-OUTER LEFT breast mass. The RIBBON and COIL clips are separated by a distance of 2 cm. The Q biopsy marking clip is in expected position at the site of biopsy of the LEFT axillary lymph node. IMPRESSION: Appropriate positioning of the RIBBON shaped biopsy marking clip at the site of biopsy in the UPPER OUTER LEFT breast (1.4 cm 1 o'clock position mass). Appropriate positioning of the COIL shaped biopsy marking clip at the site of biopsy in the UPPER OUTER LEFT breast (0.9 cm 1:30 o'clock position mass). Appropriate positioning of the Q shaped biopsy marking clip at the site of biopsy in the LEFT axilla. Final Assessment: Post Procedure Mammograms for Marker Placement Electronically Signed   By: Margarette Canada M.D.   On: 09/27/2019 14:00   [place] LT BREAST BX W LOC DEV 1ST LESION IMG BX SPEC US GUIDE  Addendum  Date: 09/30/2019   ADDENDUM REPORT: 09/30/2019 13:32 ADDENDUM: 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 This was found to be concordant by Dr. Hassan Rowan. Pathology revealed  GRADE III INVASIVE DUCTAL CARCINOMA of the LEFT breast, 1:30 o'clock, (coil clip), upper outer. This was found to be concordant by Dr. Hassan Rowan. Pathology revealed LYMPH NODE TISSUE WITH NO METASTATIC CARCINOMA IDENTIFIED of the LEFT axilla. This was found to be concordant by Dr. Hassan Rowan. Pathology results were discussed with the patient by telephone. The patient reported doing well after the biopsies with tenderness at the sites. Post biopsy instructions and care were reviewed and questions were answered. The patient was encouraged to call [facility] for any additional concerns. My direct phone number was provided. The patient was referred to [facility] on October 09, 2019. Pathology results reported by Terie Purser, RN on 09/30/2019. Electronically Signed   By: Margarette Canada M.D.   On: 09/30/2019 13:32   Result Date: 09/30/2019 CLINICAL DATA:  69 year old female for tissue sampling of 1.4 cm UPPER-OUTER LEFT breast mass, 0.9 cm UPPER-OUTER LEFT breast mass and LEFT axillary lymph node with thickened cortex. EXAM: ULTRASOUND GUIDED LEFT BREAST CORE NEEDLE BIOPSY X 2 [place] AXILLARY NODE CORE BIOPSY LEFT COMPARISON:  Previous exam(s). PROCEDURE: I met with the patient and we discussed the procedure of ultrasound-guided biopsy, including benefits and alternatives. We discussed the high likelihood of a successful procedure. We discussed the risks of the procedure, including infection, bleeding, tissue injury, clip migration, and inadequate sampling. Informed written consent was given. The usual time-out protocol was performed immediately prior to the procedure. ULTRASOUND GUIDED LEFT BREAST CORE NEEDLE BIOPSY #1 (1.4 cm mass at the 1 o'clock position-RIBBON clip): Lesion quadrant: UPPER-OUTER LEFT breast Using sterile technique and 1% Lidocaine as local anesthetic, under direct ultrasound visualization, a 12 gauge  spring-loaded device was used to perform biopsy of the 1.4 cm mass at the 1 o'clock position 6 cm from the nipple using a MEDIAL approach. At the conclusion of the procedure a RIBBON tissue marker clip was deployed into the biopsy cavity. Follow up 2 view mammogram was performed and dictated separately. ULTRASOUND-GUIDED LEFT BREAST CORE NEEDLE BIOPSY #2 (0.9 cm mass at the 1:30 o'clock position-COIL clip): Lesion quadrant: UPPER-OUTER LEFT breast Using sterile technique and 1% Lidocaine as local anesthetic, under direct ultrasound visualization, a 12 gauge spring-loaded device was used to perform biopsy of the 0.9 cm mass at the 1:30 position 6 cm from the nipple using a MEDIAL approach. At the conclusion of the procedure a COIL tissue marker clip was deployed into the biopsy cavity. Follow up 2 view mammogram was performed and dictated separately. ULTRASOUND GUIDED BIOPSY OF A LEFT AXILLARY LYMPH NODE (Q CLIP): Using sterile technique and 1% Lidocaine as local anesthetic, under direct ultrasound visualization, a 14 gauge spring-loaded device was used to perform biopsy of the LEFT axillary lymph node with focal cortical thickening using a MEDIAL approach. At the conclusion of the procedure a Q tissue marker clip was deployed into the biopsy cavity, with satisfactory placement confirmed sonographically. Follow up 2 view mammogram was performed and dictated separately. IMPRESSION: Ultrasound guided biopsy of a 1.4 cm UPPER-OUTER LEFT breast mass (RIBBON clip), a 0.9 cm UPPER-OUTER LEFT breast mass (COIL clip), and a LEFT axillary lymph node with focal cortical thickening (Q clip). No apparent complications.  Electronically Signed: By: Margarette Canada M.D. On: 09/27/2019 14:01   [place] LT BREAST BX W LOC DEV EA ADD LESION IMG BX SPEC US GUIDE  Addendum Date: 09/30/2019   ADDENDUM REPORT: 09/30/2019 13:32 ADDENDUM: Pathology revealed GRADE III INVASIVE DUCTAL CARCINOMA of the LEFT breast, 1 o'clock (ribbon clip), upper  outer. This was found to be concordant by Dr. Hassan Rowan. 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 This was found to be concordant by Dr. Hassan Rowan. Pathology revealed LYMPH NODE TISSUE WITH NO METASTATIC CARCINOMA IDENTIFIED of the LEFT axilla. This was found to be concordant by Dr. Hassan Rowan. Pathology results were discussed with the patient by telephone. The patient reported doing well after the biopsies with tenderness at the sites. Post biopsy instructions and care were reviewed and questions were answered. The patient was encouraged to call [facility] for any additional concerns. My direct phone number was provided. The patient was referred to [facility] on October 09, 2019. Pathology results reported by Terie Purser, RN on 09/30/2019. Electronically Signed   By: Margarette Canada M.D.   On: 09/30/2019 13:32   Result Date: 09/30/2019 CLINICAL DATA:  69 year old female for tissue sampling of 1.4 cm UPPER-OUTER LEFT breast mass, 0.9 cm UPPER-OUTER LEFT breast mass and LEFT axillary lymph node with thickened cortex. EXAM: ULTRASOUND GUIDED LEFT BREAST CORE NEEDLE BIOPSY X 2 [place] AXILLARY NODE CORE BIOPSY LEFT COMPARISON:  Previous exam(s). PROCEDURE: I met with the patient and we discussed the procedure of ultrasound-guided biopsy, including benefits and alternatives. We discussed the high likelihood of a successful procedure. We discussed the risks of the procedure, including infection, bleeding, tissue injury, clip migration, and inadequate sampling. Informed written consent was given. The usual time-out protocol was performed immediately prior to the procedure. ULTRASOUND GUIDED LEFT BREAST CORE NEEDLE BIOPSY #1 (1.4 cm mass at the 1 o'clock position-RIBBON clip): Lesion quadrant: UPPER-OUTER LEFT breast Using sterile technique and 1% Lidocaine  as local anesthetic, under direct ultrasound visualization, a 12 gauge spring-loaded device was used to perform biopsy of the 1.4 cm mass at the 1 o'clock position 6 cm from the nipple using a MEDIAL approach. At the conclusion of the procedure a RIBBON tissue marker clip was deployed into the biopsy cavity. Follow up 2 view mammogram was performed and dictated separately. ULTRASOUND-GUIDED LEFT BREAST CORE NEEDLE BIOPSY #2 (0.9 cm mass at the 1:30 o'clock position-COIL clip): Lesion quadrant: UPPER-OUTER LEFT breast Using sterile technique and 1% Lidocaine as local anesthetic, under direct ultrasound visualization, a 12 gauge spring-loaded device was used to perform biopsy of the 0.9 cm mass at the 1:30 position 6 cm from the nipple using a MEDIAL approach. At the conclusion of the procedure a COIL tissue marker clip was deployed into the biopsy cavity. Follow up 2 view mammogram was performed and dictated separately. ULTRASOUND GUIDED BIOPSY OF A LEFT AXILLARY LYMPH NODE (Q CLIP): Using sterile technique and 1% Lidocaine as local anesthetic, under direct ultrasound visualization, a 14 gauge spring-loaded device was used to perform biopsy of the LEFT axillary lymph node with focal cortical thickening using a MEDIAL approach. At the conclusion of the procedure a Q tissue marker clip was deployed into the biopsy cavity, with satisfactory placement confirmed sonographically. Follow up 2 view mammogram was performed and dictated separately. IMPRESSION: Ultrasound guided biopsy of a 1.4 cm UPPER-OUTER  LEFT breast mass (RIBBON clip), a 0.9 cm UPPER-OUTER LEFT breast mass (COIL clip), and a LEFT axillary lymph node with focal cortical thickening (Q clip). No apparent complications. Electronically Signed: By: Margarette Canada M.D. On: 09/27/2019 14:01   IR IMAGING GUIDED PORT INSERTION  Result Date: 10/18/2019 INDICATION: 69 year old female with history of left breast cancer requiring central venous access for chemotherapy.  EXAM: IMPLANTED PORT A CATH PLACEMENT WITH ULTRASOUND AND FLUOROSCOPIC GUIDANCE COMPARISON:  None. MEDICATIONS: Ancef 2 gm IV; The antibiotic was administered within an appropriate time interval prior to skin puncture. ANESTHESIA/SEDATION: Moderate (conscious) sedation was employed during this procedure. A total of Versed 2 mg and Fentanyl 100 mcg was administered intravenously. Moderate Sedation Time: 22 minutes. The patient's level of consciousness and vital signs were monitored continuously by radiology nursing throughout the procedure under my direct supervision. CONTRAST:  None FLUOROSCOPY TIME:  0 minutes, 6 seconds (2 mGy) COMPLICATIONS: None immediate. PROCEDURE: The procedure, risks, benefits, and alternatives were explained to the patient. Questions regarding the procedure were encouraged and answered. The patient understands and consents to the procedure. The right neck and chest were prepped with chlorhexidine in a sterile fashion, and a sterile drape was applied covering the operative field. Maximum barrier sterile technique with sterile gowns and gloves were used for the procedure. A timeout was performed prior to the initiation of the procedure. Ultrasound was used to examine the jugular vein which was compressible and free of internal echoes. A skin marker was used to demarcate the planned venotomy and port pocket incision sites. Local anesthesia was provided to these sites and the subcutaneous tunnel track with 1% lidocaine with 1:100,000 epinephrine. A small incision was created at the jugular access site and blunt dissection was performed of the subcutaneous tissues. Under real time ultrasound guidance, the jugular vein was accessed with a 21 ga micropuncture needle and an 0.018" wire was inserted to the superior vena cava. A 5 Fr micopuncture set was then used, through which a 0.035" Rosen wire was passed under fluoroscopic guidance into the inferior vena cava. An 8 Fr dilator was then placed  over the wire. A subcutaneous port pocket was then created along the upper chest wall utilizing a combination of sharp and blunt dissection. The pocket was irrigated with sterile saline, packed with gauze, and observed for hemorrhage. A single lumen "ISP" sized power injectable port was chosen for placement. The 8 Fr catheter was tunneled from the port pocket site to the venotomy incision. The port was placed in the pocket. The external catheter was trimmed to appropriate length. The dilator was exchanged for an 8 Fr peel-away sheath under fluoroscopic guidance. The catheter was then placed through the sheath and the sheath was removed. Final catheter positioning was confirmed and documented with a fluoroscopic spot radiograph. The port was accessed with a Huber needle, aspirated, and flushed with heparinized saline. The deep dermal layer of the port pocket incision was closed with interrupted 3-0 Vicryl suture. The skin was opposed with a running subcuticular 4-0 Monocryl suture. Dermabond was then placed over the port pocket and neck incisions. The patient tolerated the procedure well without immediate post procedural complication. FINDINGS: After catheter placement, the tip lies within the cavoatrial junction the catheter aspirates and flushes normally and is ready for immediate use. IMPRESSION: Successful placement of a power injectable Port-A-Cath via the right internal jugular vein. The catheter is ready for immediate use. Ruthann Cancer, MD [facility] Electronically Signed  By: Ruthann Cancer MD   On: 10/18/2019 12:31     ELIGIBLE FOR AVAILABLE RESEARCH PROTOCOL: AET  ASSESSMENT: 69 y.o. [place] woman status post left breast upper outer quadrant biopsy 09/27/2019 for a clinically T1-T2 N0, stage IA- IIA invasive ductal carcinoma, grade 3, triple negative, with an MIB-1 of 90%.  (1) genetics testing results pending  (2) neoadjuvant  chemotherapy will consist of doxorubicin and cyclophosphamide in dose dense fashion x4 starting 10/22/2019 to be followed by weekly carboplatin and paclitaxel x12  (a) echo 10/17/2019 shows an ejection fraction in the 60-65% range  (3) definitive surgery to follow  (4) adjuvant radiation as appropriate   PLAN: Lyndie is ready to start chemo next week.  She has a very good understanding of the possible toxicities and effects and complications as well as the possible benefits.  She also has on hand all her supportive medications and has a "roadmap" on how to take them.  She does understand how to use the roadmap.  We reviewed some of that today and I also gave her a copy of her first 2 weeks appointments and exactly what will be accomplished during those visits.  I also asked her to keep a diary of symptoms beginning on the day of chemo so when she sees me a week later we can more easily troubleshoot problems.  Since she has not yet had her MRI of the breast we are waiting on the Covid booster shot and she will receive that 10/29/2019  I have asked her to make sure to call us with any other issues that may develop before her next visit  Total encounter time 25 minutes.Sarajane Jews C. Savoy Somerville, MD 10/18/2019 1:49 PM Medical Oncology and Hematology [facility] [place], [place] [address] Tel. [phone]    Fax. [phone]   This document serves as a record of services personally performed by Lurline Del, MD. It was created on his behalf by Wilburn Mylar, a trained medical scribe. The creation of this record is based on the scribe's personal observations and the provider's statements to them.   I, Lurline Del MD, have reviewed the above documentation for accuracy and completeness, and I agree with the above.   *Total Encounter Time as defined by the Centers for Medicare and Medicaid Services includes, in addition to the face-to-face time of a patient  visit (documented in the note above) non-face-to-face time: obtaining and reviewing outside history, ordering and reviewing medications, tests or procedures, care coordination (communications with other health care professionals or caregivers) and documentation in the medical record.

## 2019-10-17 NOTE — Progress Notes (Signed)
  Echocardiogram 2D Echocardiogram has been performed.  Cheyenne Mcdonald G Cheyenne Mcdonald 10/17/2019, 10:04 AM

## 2019-10-18 ENCOUNTER — Ambulatory Visit (HOSPITAL_COMMUNITY)
Admission: RE | Admit: 2019-10-18 | Discharge: 2019-10-18 | Disposition: A | Discharge status: Home / Self Care | Payer: Medicare HMO | Source: Ambulatory Visit | Attending: Oncology | Admitting: Oncology

## 2019-10-18 ENCOUNTER — Other Ambulatory Visit: Payer: Self-pay | Admitting: Oncology

## 2019-10-18 ENCOUNTER — Other Ambulatory Visit: Payer: Self-pay

## 2019-10-18 ENCOUNTER — Inpatient Hospital Stay (HOSPITAL_BASED_OUTPATIENT_CLINIC_OR_DEPARTMENT_OTHER): Payer: Medicare HMO | Admitting: Oncology

## 2019-10-18 ENCOUNTER — Encounter (HOSPITAL_COMMUNITY): Payer: Self-pay

## 2019-10-18 ENCOUNTER — Encounter: Payer: Self-pay | Admitting: Oncology

## 2019-10-18 ENCOUNTER — Inpatient Hospital Stay: Payer: Medicare HMO

## 2019-10-18 VITALS — BP 99/88 | HR 71 | Temp 97.9°F | Resp 18 | Ht 63.5 in | Wt 185.5 lb

## 2019-10-18 DIAGNOSIS — Z171 Estrogen receptor negative status [ER-]: Secondary | ICD-10-CM

## 2019-10-18 DIAGNOSIS — Z452 Encounter for adjustment and management of vascular access device: Secondary | ICD-10-CM | POA: Diagnosis not present

## 2019-10-18 DIAGNOSIS — Z5111 Encounter for antineoplastic chemotherapy: Secondary | ICD-10-CM | POA: Diagnosis not present

## 2019-10-18 DIAGNOSIS — Z803 Family history of malignant neoplasm of breast: Secondary | ICD-10-CM | POA: Diagnosis not present

## 2019-10-18 DIAGNOSIS — Z791 Long term (current) use of non-steroidal anti-inflammatories (NSAID): Secondary | ICD-10-CM | POA: Diagnosis not present

## 2019-10-18 DIAGNOSIS — Z79899 Other long term (current) drug therapy: Secondary | ICD-10-CM | POA: Diagnosis not present

## 2019-10-18 DIAGNOSIS — C50412 Malignant neoplasm of upper-outer quadrant of left female breast: Secondary | ICD-10-CM | POA: Diagnosis not present

## 2019-10-18 DIAGNOSIS — Z5189 Encounter for other specified aftercare: Secondary | ICD-10-CM | POA: Diagnosis not present

## 2019-10-18 DIAGNOSIS — C50912 Malignant neoplasm of unspecified site of left female breast: Secondary | ICD-10-CM | POA: Diagnosis not present

## 2019-10-18 DIAGNOSIS — Z7689 Persons encountering health services in other specified circumstances: Secondary | ICD-10-CM | POA: Diagnosis not present

## 2019-10-18 DIAGNOSIS — G4730 Sleep apnea, unspecified: Secondary | ICD-10-CM | POA: Diagnosis not present

## 2019-10-18 DIAGNOSIS — E669 Obesity, unspecified: Secondary | ICD-10-CM | POA: Diagnosis not present

## 2019-10-18 HISTORY — PX: IR IMAGING GUIDED PORT INSERTION: IMG5740

## 2019-10-18 LAB — CBC WITH DIFFERENTIAL/PLATELET
Abs Immature Granulocytes: 0.01 10*3/uL (ref 0.00–0.07)
Basophils Absolute: 0 10*3/uL (ref 0.0–0.1)
Basophils Relative: 1 %
Eosinophils Absolute: 0.1 10*3/uL (ref 0.0–0.5)
Eosinophils Relative: 2 %
HCT: 41.5 % (ref 36.0–46.0)
Hemoglobin: 13.7 g/dL (ref 12.0–15.0)
Immature Granulocytes: 0 %
Lymphocytes Relative: 26 %
Lymphs Abs: 1.4 10*3/uL (ref 0.7–4.0)
MCH: 30.8 pg (ref 26.0–34.0)
MCHC: 33 g/dL (ref 30.0–36.0)
MCV: 93.3 fL (ref 80.0–100.0)
Monocytes Absolute: 0.6 10*3/uL (ref 0.1–1.0)
Monocytes Relative: 12 %
Neutro Abs: 3.2 10*3/uL (ref 1.7–7.7)
Neutrophils Relative %: 59 %
Platelets: 181 10*3/uL (ref 150–400)
RBC: 4.45 MIL/uL (ref 3.87–5.11)
RDW: 12.4 % (ref 11.5–15.5)
WBC: 5.4 10*3/uL (ref 4.0–10.5)
nRBC: 0 % (ref 0.0–0.2)

## 2019-10-18 LAB — PROTIME-INR
INR: 1 (ref 0.8–1.2)
Prothrombin Time: 12.7 seconds (ref 11.4–15.2)

## 2019-10-18 IMAGING — US IR IMAGING GUIDED PORT INSERTION
3 series · 3 of 3 positions shown · non-contrast
Comparison: None.

INDICATION: 69-year-old female with history of left breast cancer requiring
central venous access for chemotherapy.

EXAM:
IMPLANTED PORT A CATH PLACEMENT WITH ULTRASOUND AND FLUOROSCOPIC
GUIDANCE

[Series 1: (id) · 1 of 1 slices shown]
[im 1/1]
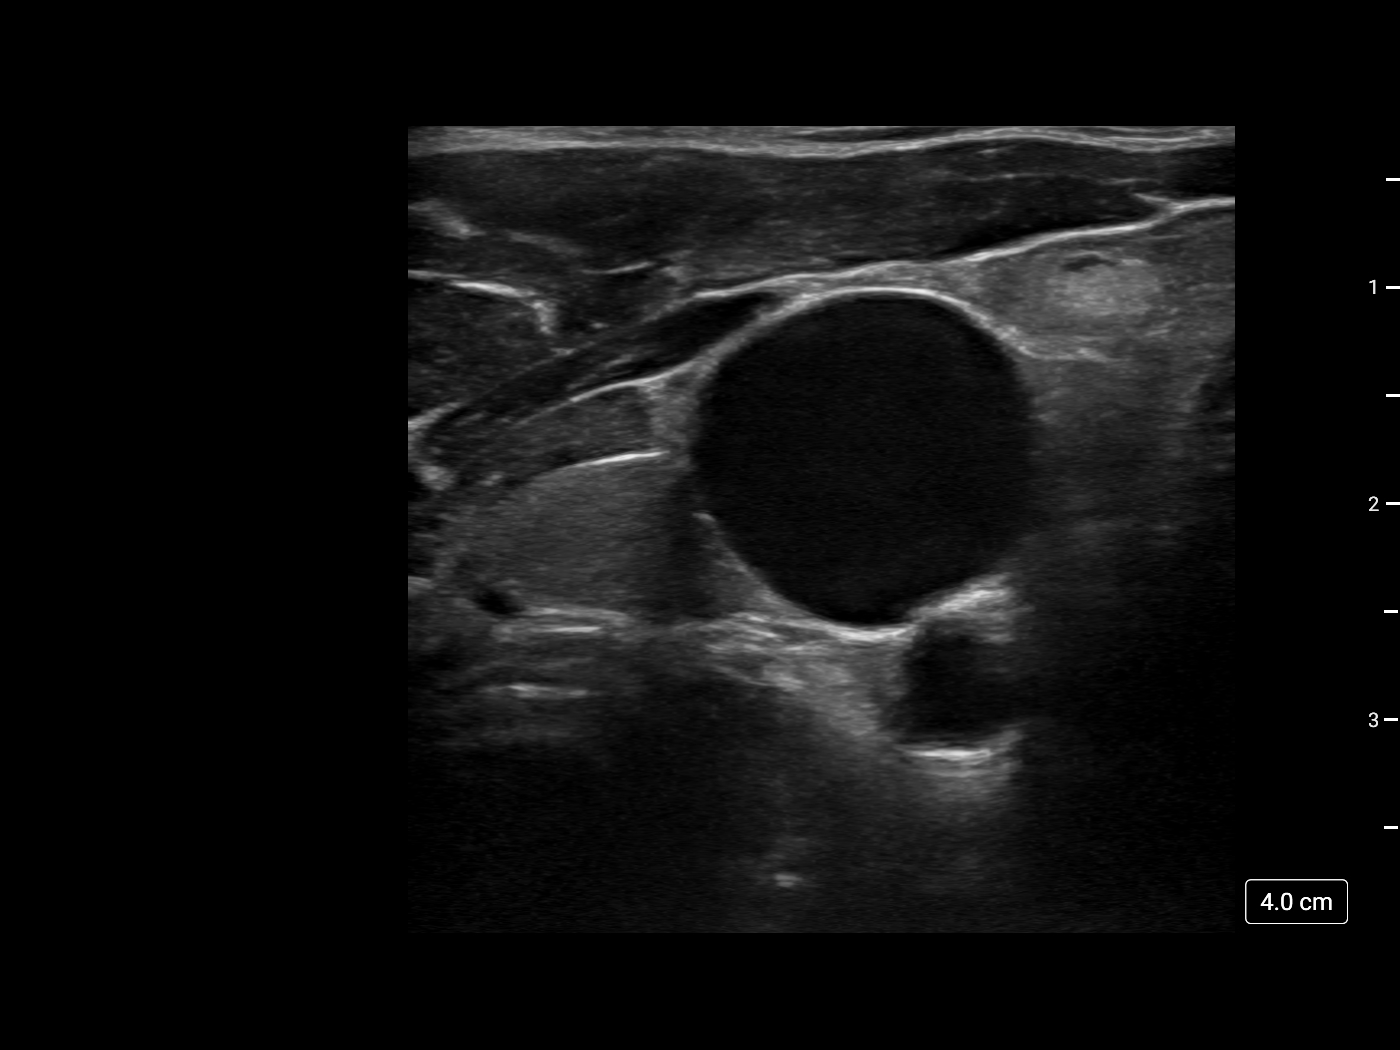

[Series 2: care single · 1 of 1 slices shown]
[im 1/1]
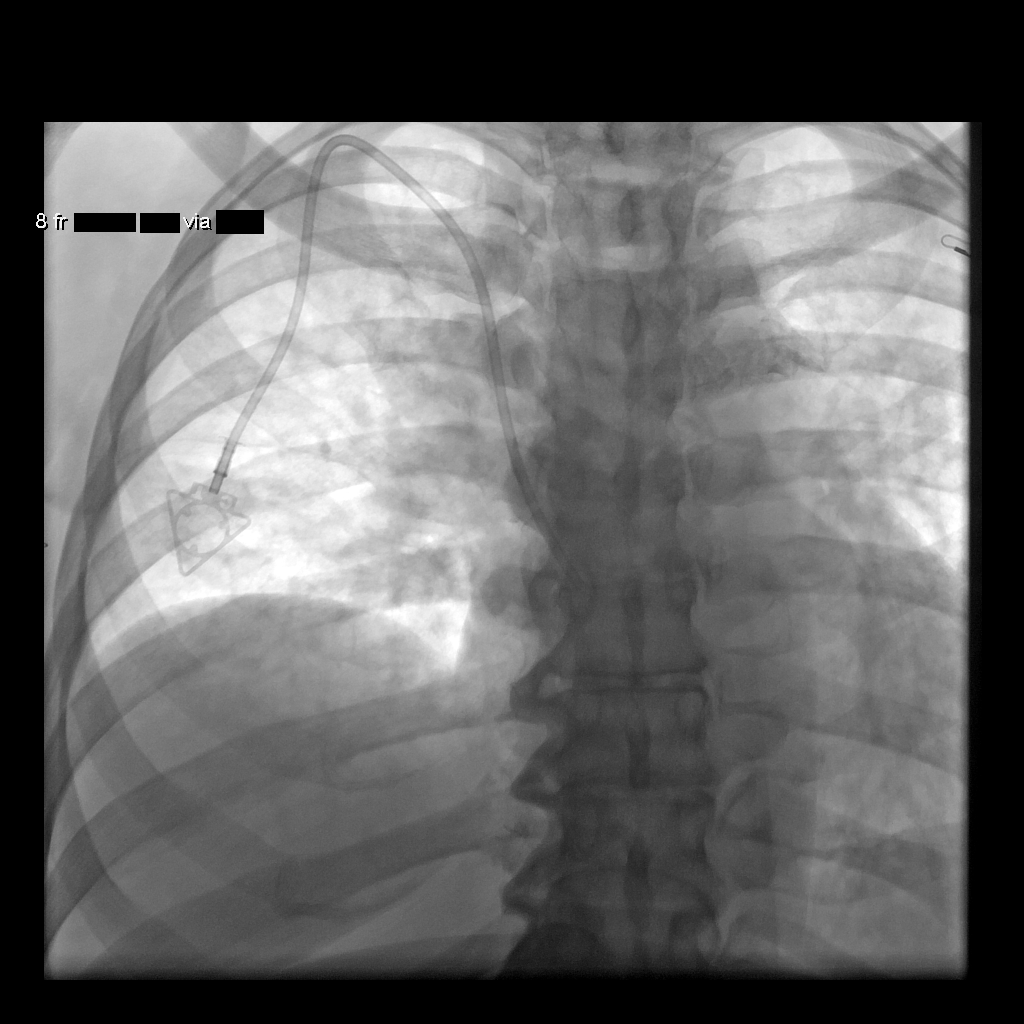

[Series 300: line placements · 1 of 1 slices shown]
[im 1/1]
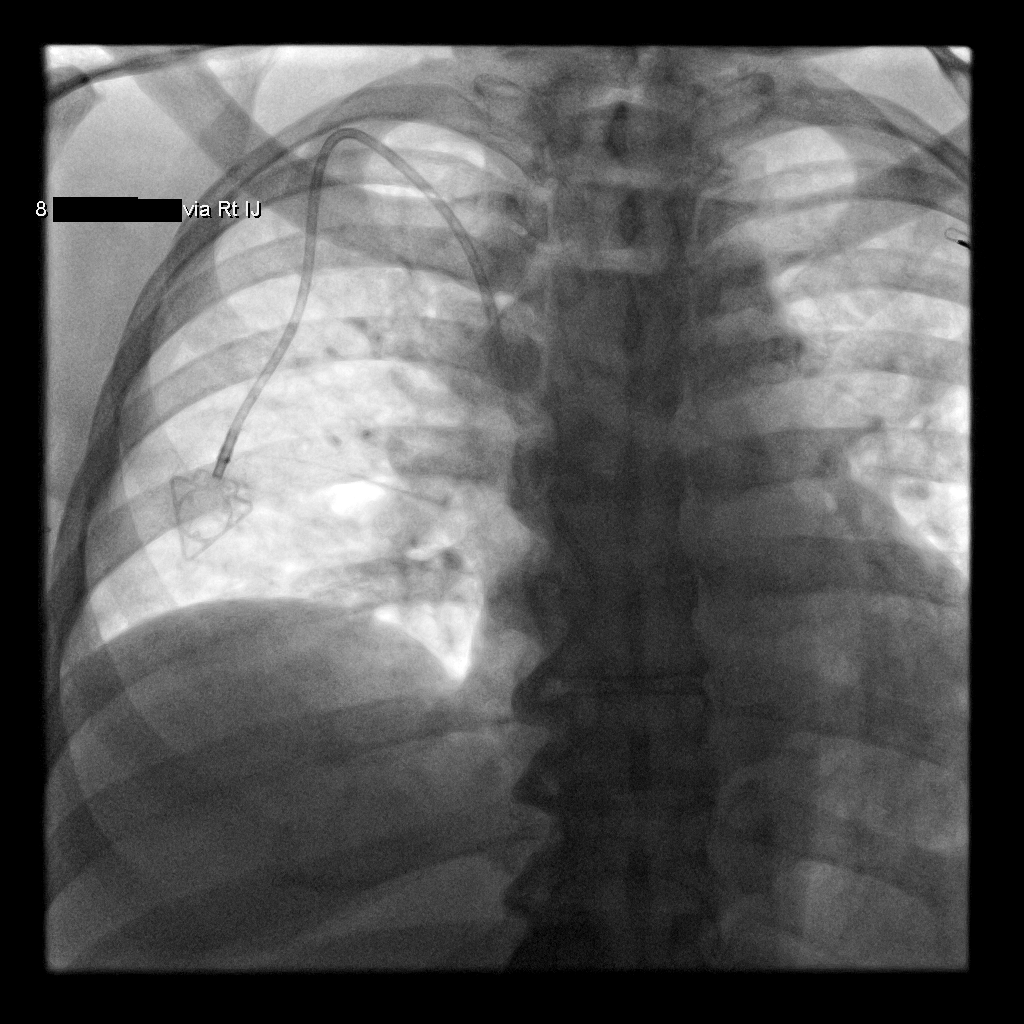

[3 of 3 positions shown; findings below may reference images not displayed]

MEDICATIONS:
Ancef 2 gm IV; The antibiotic was administered within an appropriate
time interval prior to skin puncture.

ANESTHESIA/SEDATION:
Moderate (conscious) sedation was employed during this procedure. A
total of Versed 2 mg and Fentanyl 100 mcg was administered
intravenously.

Moderate Sedation Time: 22 minutes. The patient's level of
consciousness and vital signs were monitored continuously by
radiology nursing throughout the procedure under my direct
supervision.

CONTRAST:  None

FLUOROSCOPY TIME:  0 minutes, 6 seconds (2 mGy)

COMPLICATIONS:
None immediate.

PROCEDURE:
The procedure, risks, benefits, and alternatives were explained to
the patient. Questions regarding the procedure were encouraged and
answered. The patient understands and consents to the procedure.

The right neck and chest were prepped with chlorhexidine in a
sterile fashion, and a sterile drape was applied covering the
operative field. Maximum barrier sterile technique with sterile
gowns and gloves were used for the procedure. A timeout was
performed prior to the initiation of the procedure.

Ultrasound was used to examine the jugular vein which was
compressible and free of internal echoes. A skin marker was used to
demarcate the planned venotomy and port pocket incision sites. Local
anesthesia was provided to these sites and the subcutaneous tunnel
track with 1% lidocaine with [DATE] epinephrine.

A small incision was created at the jugular access site and blunt
dissection was performed of the subcutaneous tissues. Under real
time ultrasound guidance, the jugular vein was accessed with a 21 ga
micropuncture needle and an 0.018" wire was inserted to the superior
vena cava. A 5 Fr micopuncture set was then used, through which a
0.035" Rosen wire was passed under fluoroscopic guidance into the
inferior vena cava. An 8 Fr dilator was then placed over the wire.

A subcutaneous port pocket was then created along the upper chest
wall utilizing a combination of sharp and blunt dissection. The
pocket was irrigated with sterile saline, packed with gauze, and
observed for hemorrhage. A single lumen "ISP" sized power injectable
port was chosen for placement. The 8 Fr catheter was tunneled from
the port pocket site to the venotomy incision. The port was placed
in the pocket. The external catheter was trimmed to appropriate
length. The dilator was exchanged for an 8 Fr peel-away sheath under
fluoroscopic guidance. The catheter was then placed through the
sheath and the sheath was removed. Final catheter positioning was
confirmed and documented with a fluoroscopic spot radiograph. The
port was accessed with CELIA needle, aspirated, and flushed with
heparinized saline.

The deep dermal layer of the port pocket incision was closed with
interrupted 3-0 Vicryl suture. The skin was opposed with a running
subcuticular 4-0 Monocryl suture. Dermabond was then placed over the
port pocket and neck incisions. The patient tolerated the procedure
well without immediate post procedural complication.
FINDINGS: After catheter placement, the tip lies within the cavoatrial
junction the catheter aspirates and flushes normally and is ready
for immediate use.
IMPRESSION: Successful placement of a power injectable Port-A-Cath via the right
internal jugular vein. The catheter is ready for immediate use.

## 2019-10-18 MED ORDER — MIDAZOLAM HCL 2 MG/2ML IJ SOLN
INTRAMUSCULAR | Status: AC | PRN
  Administered 2019-10-18: 0.5 mg via INTRAVENOUS
  Administered 2019-10-18: 1 mg via INTRAVENOUS
  Administered 2019-10-18: 0.5 mg via INTRAVENOUS

## 2019-10-18 MED ORDER — LIDOCAINE HCL (PF) 1 % IJ SOLN
INTRAMUSCULAR | Status: AC | PRN
  Administered 2019-10-18: 20 mL

## 2019-10-18 MED ORDER — SODIUM CHLORIDE 0.9 % IV SOLN: INTRAVENOUS | Status: DC

## 2019-10-18 MED ORDER — CEFAZOLIN SODIUM-DEXTROSE 2-4 GM/100ML-% IV SOLN: 2.0000 g | INTRAVENOUS | Status: AC

## 2019-10-18 MED ORDER — HEPARIN SOD (PORK) LOCK FLUSH 100 UNIT/ML IV SOLN
INTRAVENOUS | Status: AC
  Filled 2019-10-18: qty 5

## 2019-10-18 MED ORDER — MIDAZOLAM HCL 2 MG/2ML IJ SOLN
INTRAMUSCULAR | Status: AC
  Filled 2019-10-18: qty 4

## 2019-10-18 MED ORDER — FENTANYL CITRATE (PF) 100 MCG/2ML IJ SOLN
INTRAMUSCULAR | Status: AC | PRN
  Administered 2019-10-18 (×2): 25 ug via INTRAVENOUS
  Administered 2019-10-18: 50 ug via INTRAVENOUS

## 2019-10-18 MED ORDER — HEPARIN SOD (PORK) LOCK FLUSH 100 UNIT/ML IV SOLN
INTRAVENOUS | Status: AC | PRN
  Administered 2019-10-18: 500 [IU] via INTRAVENOUS

## 2019-10-18 MED ORDER — FENTANYL CITRATE (PF) 100 MCG/2ML IJ SOLN
INTRAMUSCULAR | Status: AC
  Filled 2019-10-18: qty 2

## 2019-10-18 MED ORDER — LIDOCAINE-EPINEPHRINE 1 %-1:100000 IJ SOLN
INTRAMUSCULAR | Status: AC
  Filled 2019-10-18: qty 1

## 2019-10-18 MED ORDER — CEFAZOLIN SODIUM-DEXTROSE 2-4 GM/100ML-% IV SOLN
INTRAVENOUS | Status: AC
  Administered 2019-10-18: 2 g via INTRAVENOUS
  Filled 2019-10-18: qty 100

## 2019-10-18 NOTE — Discharge Instructions (Signed)
Do not use lidocaine cream over your new port as the petroleum in the lidocaine cream will dissolve the skin glue over your new port. The skin will come apart resulting in an infection. Use ice in a zip lock bag for 1-2 minutes prior to the nurses accessing your new port.     Implanted Port Insertion, Care After This sheet gives you information about how to care for yourself after your procedure. Your health care provider may also give you more specific instructions. If you have problems or questions, contact your health care provider. What can I expect after the procedure? After the procedure, it is common to have:  Discomfort at the port insertion site.  Bruising on the skin over the port. This should improve over 3-4 days. Follow these instructions at home: [facility] care  After your port is placed, you will get a manufacturer's information card. The card has information about your port. Keep this card with you at all times.  Take care of the port as told by your health care provider. Ask your health care provider if you or a family member can get training for taking care of the port at home. A home health care nurse may also take care of the port.  Make sure to remember what type of port you have. Incision care      Follow instructions from your health care provider about how to take care of your port insertion site. Make sure you: ? Wash your hands with soap and water before and after you change your bandage (dressing). If soap and water are not available, use hand sanitizer. ? Change your dressing as told by your health care provider.  Leave  skin glue in place. These skin closures may need to stay in place for 2 weeks or longer. Check your port insertion site every day for signs of infection. Check for: ? Redness, swelling, or pain. ? Fluid or blood. ? Warmth. ? Pus or a bad smell. Activity  Return to your normal activities as told by your health care provider. Ask your  health care provider what activities are safe for you.  Do not lift anything that is heavier than 10 lb (4.5 kg), or the limit that you are told, until your health care provider says that it is safe. General instructions  Take over-the-counter and prescription medicines only as told by your health care provider.  Do not take baths, swim, or use a hot tub until your health care provider approves.You may shower tomorrow around 1 PM.  Do not drive for 24 hours if you were given a sedative during your procedure.  Wear a medical alert bracelet in case of an emergency. This will tell any health care providers that you have a port.  Keep all follow-up visits as told by your health care provider. This is important. Contact a health care provider if:  You cannot flush your port with saline as directed, or you cannot draw blood from the port.  You have a fever or chills.  You have redness, swelling, or pain around your port insertion site.  You have fluid or blood coming from your port insertion site.  Your port insertion site feels warm to the touch.  You have pus or a bad smell coming from the port insertion site. Get help right away if:  You have chest pain or shortness of breath.  You have bleeding from your port that you cannot control. Summary  Take care of the  port as told by your health care provider. Keep the manufacturer's information card with you at all times.  Contact a health care provider if you have a fever or chills or if you have redness, swelling, or pain around your port insertion site.  Keep all follow-up visits as told by your health care provider. This information is not intended to replace advice given to you by your health care provider. Make sure you discuss any questions you have with your health care provider. Document Revised: 07/25/2017 Document Reviewed: 07/25/2017 Elsevier Patient Education  [place].        Moderate Conscious Sedation,  Adult, Care After These instructions provide you with information about caring for yourself after your procedure. Your health care provider may also give you more specific instructions. Your treatment has been planned according to current medical practices, but problems sometimes occur. Call your health care provider if you have any problems or questions after your procedure. What can I expect after the procedure? After your procedure, it is common:  To feel sleepy for several hours.  To feel clumsy and have poor balance for several hours.  To have poor judgment for several hours.  To vomit if you eat too soon. Follow these instructions at home: For at least 24 hours after the procedure:   Do not: ? Participate in activities where you could fall or become injured. ? Drive. ? Use heavy machinery. ? Drink alcohol. ? Take sleeping pills or medicines that cause drowsiness. ? Make important decisions or sign legal documents. ? Take care of children on your own.  Rest. Eating and drinking  Follow the diet recommended by your health care provider.  If you vomit: ? Drink water, juice, or soup when you can drink without vomiting. ? Make sure you have little or no nausea before eating solid foods. General instructions  Have a responsible adult stay with you until you are awake and alert.  Take over-the-counter and prescription medicines only as told by your health care provider.  If you smoke, do not smoke without supervision.  Keep all follow-up visits as told by your health care provider. This is important. Contact a health care provider if:  You keep feeling nauseous or you keep vomiting.  You feel light-headed.  You develop a rash.  You have a fever. Get help right away if:  You have trouble breathing. This information is not intended to replace advice given to you by your health care provider. Make sure you discuss any questions you have with your health care  provider. Document Revised: 12/09/2016 Document Reviewed: 04/18/2015 Elsevier Patient Education  2020 [employer].

## 2019-10-18 NOTE — Procedures (Signed)
Interventional Radiology Procedure Note ° °Procedure: Single Lumen Power Port Placement   ° °Access:  Right internal jugular vein ° °Findings: Catheter tip positioned at cavoatrial junction. Port is ready for immediate use.  ° °Complications: None ° °EBL: < 10 mL ° °Recommendations:  °- Ok to shower in 24 hours °- Do not submerge for 7 days °- Routine line care  ° ° °Dawnelle Warman, MD °Pager: [phone] ° ° ° °

## 2019-10-18 NOTE — Progress Notes (Signed)
Met with patient to introduce myself as Arboriculturist and to offer available resources.  Discussed one-time $1000 Radio broadcast assistant to assist with personal expenses while going through treatment.  Gave her my card if interested in applying and for any additional financial questions or concerns.

## 2019-10-18 NOTE — Consult Note (Signed)
Chief Complaint: Patient was seen in consultation today for Port-A-Cath placement  Referring Physician(s): [place] Physician: Suttle,D  Patient Status: [facility] - Out-pt  History of Present Illness: Cheyenne Mcdonald is a 69 y.o. female with history of newly diagnosed left breast carcinoma who presents today for Port-A-Cath placement for chemotherapy.  Past Medical History:  Diagnosis Date  . Breast cancer ([place])   . Obesity   . Sleep apnea     Past Surgical History:  Procedure Laterality Date  . ABDOMINAL HYSTERECTOMY      Allergies: Other and Shellfish allergy  Medications: Prior to Admission medications   Medication Sig Start Date End Date Taking? Authorizing Provider  Cholecalciferol 125 MCG (5000 UT) capsule Take by mouth.   Yes [provider]  diclofenac sodium (VOLTAREN) 1 % GEL Apply 1 application topically 4 (four) times daily.     Yes [provider]  dexamethasone (DECADRON) 4 MG tablet Take 2 tablets (8 mg) by mouth twice daily days 2 and 3 after chemo, then take one last dose the morning of day 4; take with food 10/09/19   Magrinat, Virgie Dad, MD  lidocaine-prilocaine (EMLA) cream Apply to affected area once 10/09/19   Magrinat, Virgie Dad, MD  loratadine (CLARITIN) 10 MG tablet Take 1 tablet (10 mg total) by mouth daily. 10/09/19   Magrinat, Virgie Dad, MD  LORazepam (ATIVAN) 0.5 MG tablet Take 1 tablet (0.5 mg total) by mouth at bedtime as needed (Nausea or vomiting). 10/09/19   Magrinat, Virgie Dad, MD  meloxicam (MOBIC) 15 MG tablet Take 15 mg by mouth daily. With food     [provider]  Naltrexone-buPROPion HCl ER (CONTRAVE) 8-90 MG TB12  05/17/19   [provider]  prochlorperazine (COMPAZINE) 10 MG tablet Take 1 tablet (10 mg total) by mouth every 6 (six) hours as needed (Nausea or vomiting). 10/09/19   Magrinat, Virgie Dad, MD     Family History  Problem Relation Age of Onset  . Breast cancer Cousin 68        maternal cousin; bilateral     Social History   Socioeconomic History  . Marital status: Married    Spouse name: Not on file  . Number of children: Not on file  . Years of education: Not on file  . Highest education level: Not on file  Occupational History  . Not on file  Tobacco Use  . Smoking status: Never Smoker  . Smokeless tobacco: Never Used  Substance and Sexual Activity  . Alcohol use: Never  . Drug use: Never  . Sexual activity: Not on file  Other Topics Concern  . Not on file  Social History Narrative  . Not on file   Social Determinants of Health   Financial Resource Strain:   . Difficulty of Paying Living Expenses: Not on file  Food Insecurity:   . Worried About Charity fundraiser in the Last Year: Not on file  . Ran Out of Food in the Last Year: Not on file  Transportation Needs:   . Lack of Transportation (Medical): Not on file  . Lack of Transportation (Non-Medical): Not on file  Physical Activity:   . Days of Exercise per Week: Not on file  . Minutes of Exercise per Session: Not on file  Stress:   . Feeling of Stress : Not on file  Social Connections:   . Frequency of Communication with Friends and Family: Not on file  . Frequency  of Social Gatherings with Friends and Family: Not on file  . Attends Religious Services: Not on file  . Active Member of Clubs or Organizations: Not on file  . Attends Archivist Meetings: Not on file  . Marital Status: Not on file     Review of Systems currently denies fever, headache, chest pain, dyspnea, cough, abdominal/back pain, nausea, vomiting or bleeding  Vital Signs: BP (!) 143/83 (BP Location: Right Arm)   Pulse 60   Temp 97.9 F (36.6 C) (Oral)   Resp 18   SpO2 100%   Physical Exam awake, alert.  Chest clear to auscultation bilaterally.  Heart with regular rate and rhythm.  Abdomen soft, positive bowel sounds, nontender.  Trace pretibial edema bilaterally.  Imaging: ECHOCARDIOGRAM  COMPLETE  Result Date: 10/17/2019    ECHOCARDIOGRAM REPORT   Patient Name:   Cheyenne Mcdonald Date of Exam: 10/17/2019 Medical Rec #:  [id number]      Height:       63.5 in Accession #:    [id number]     Weight:       183.8 lb Date of Birth:  1950/03/29       BSA:          1.876 m Patient Age:    28 years       BP:           134/79 mmHg Patient Gender: F              HR:           56 bpm. Exam Location:  Outpatient Procedure: 2D Echo, Cardiac Doppler, Color Doppler, Strain Analysis and 3D Echo Indications:    Z51.11 Encounter for antineoplastic chemotheraphy  History:        Patient has no prior history of Echocardiogram examinations.                 Risk Factors:Sleep Apnea.  Sonographer:    Jonelle Sidle Dance Referring Phys: [place]  1. Left ventricular ejection fraction, by estimation, is 60 to 65%. The left ventricle has normal function. The left ventricle has no regional wall motion abnormalities. Left ventricular diastolic parameters are consistent with Grade I diastolic dysfunction (impaired relaxation).  2. Right ventricular systolic function is normal. The right ventricular size is normal.  3. The mitral valve is normal in structure. No evidence of mitral valve regurgitation. No evidence of mitral stenosis.  4. The aortic valve is tricuspid. There is mild thickening of the aortic valve. Aortic valve regurgitation is not visualized.  5. The inferior vena cava is normal in size with greater than 50% respiratory variability, suggesting right atrial pressure of 3 mmHg. Comparison(s): No prior Echocardiogram. FINDINGS  Left Ventricle: Left ventricular ejection fraction, by estimation, is 60 to 65%. The left ventricle has normal function. The left ventricle has no regional wall motion abnormalities. The left ventricular internal cavity size was normal in size. There is  no left ventricular hypertrophy. Left ventricular diastolic parameters are consistent with Grade I diastolic dysfunction  (impaired relaxation). Right Ventricle: The right ventricular size is normal. No increase in right ventricular wall thickness. Right ventricular systolic function is normal. Left Atrium: Left atrial size was normal in size. Right Atrium: Right atrial size was normal in size. Pericardium: There is no evidence of pericardial effusion. Mitral Valve: The mitral valve is normal in structure. There is mild thickening of the mitral valve leaflet(s). Mild mitral annular calcification. No evidence of  mitral valve regurgitation. No evidence of mitral valve stenosis. Tricuspid Valve: The tricuspid valve is normal in structure. Tricuspid valve regurgitation is trivial. Aortic Valve: The aortic valve is tricuspid. There is mild thickening of the aortic valve. Aortic valve regurgitation is not visualized. Pulmonic Valve: The pulmonic valve was normal in structure. Pulmonic valve regurgitation is trivial. Aorta: The aortic root and ascending aorta are structurally normal, with no evidence of dilitation. Venous: The inferior vena cava is normal in size with greater than 50% respiratory variability, suggesting right atrial pressure of 3 mmHg. IAS/Shunts: No atrial level shunt detected by color flow Doppler.  LEFT VENTRICLE PLAX 2D LVIDd:         4.70 cm  Diastology LVIDs:         3.00 cm  LV e' medial:    6.20 cm/s LV PW:         0.90 cm  LV E/e' medial:  14.3 LV IVS:        1.10 cm  LV e' lateral:   7.94 cm/s LVOT diam:     2.00 cm  LV E/e' lateral: 11.2 LV SV:         79 LV SV Index:   42 LVOT Area:     3.14 cm                          3D Volume EF:                         3D EF:        66 %                         LV EDV:       119 ml                         LV ESV:       41 ml                         LV SV:        78 ml RIGHT VENTRICLE             IVC RV Basal diam:  2.50 cm     IVC diam: 1.40 cm RV S prime:     12.20 cm/s TAPSE (M-mode): 2.0 cm LEFT ATRIUM             Index       RIGHT ATRIUM           Index LA diam:         4.30 cm 2.29 cm/m  RA Area:     11.50 cm LA Vol (A2C):   52.4 ml 27.93 ml/m RA Volume:   21.80 ml  11.62 ml/m LA Vol (A4C):   35.3 ml 18.81 ml/m LA Biplane Vol: 44.4 ml 23.66 ml/m  AORTIC VALVE LVOT Vmax:   106.00 cm/s LVOT Vmean:  69.200 cm/s LVOT VTI:    0.252 m  AORTA Ao Root diam: 3.20 cm Ao Asc diam:  3.30 cm MITRAL VALVE MV Area (PHT): 2.73 cm     SHUNTS MV Decel Time: 278 msec     Systemic VTI:  0.25 m MV E velocity: 88.60 cm/s   Systemic Diam: 2.00 cm MV A velocity: 103.00 cm/s MV E/A ratio:  0.86 Gwyndolyn Kaufman  MD Electronically signed by Gwyndolyn Kaufman MD Signature Date/Time: 10/17/2019/10:20:32 AM    Final    [place] AXILLARY NODE CORE BIOPSY LEFT  Addendum Date: 09/30/2019   ADDENDUM REPORT: 09/30/2019 13:32 ADDENDUM: 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 This was found to be concordant by Dr. Hassan Rowan. 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 This was found to be concordant by Dr. Hassan Rowan. Pathology revealed LYMPH NODE TISSUE WITH NO METASTATIC CARCINOMA IDENTIFIED of the LEFT axilla. This was found to be concordant by Dr. Hassan Rowan. Pathology results were discussed with the patient by telephone. The patient reported doing well after the biopsies with tenderness at the sites. Post biopsy instructions and care were reviewed and questions were answered. The patient was encouraged to call [facility] for any additional concerns. My direct phone number was provided. The patient was referred to [facility] on October 09, 2019. Pathology results reported by Terie Purser, RN on 09/30/2019. Electronically Signed   By: Margarette Canada M.D.   On: 09/30/2019 13:32   Result Date: 09/30/2019 CLINICAL DATA:  69 year old female for tissue sampling of 1.4 cm  UPPER-OUTER LEFT breast mass, 0.9 cm UPPER-OUTER LEFT breast mass and LEFT axillary lymph node with thickened cortex. EXAM: ULTRASOUND GUIDED LEFT BREAST CORE NEEDLE BIOPSY X 2 [place] AXILLARY NODE CORE BIOPSY LEFT COMPARISON:  Previous exam(s). PROCEDURE: I met with the patient and we discussed the procedure of ultrasound-guided biopsy, including benefits and alternatives. We discussed the high likelihood of a successful procedure. We discussed the risks of the procedure, including infection, bleeding, tissue injury, clip migration, and inadequate sampling. Informed written consent was given. The usual time-out protocol was performed immediately prior to the procedure. ULTRASOUND GUIDED LEFT BREAST CORE NEEDLE BIOPSY #1 (1.4 cm mass at the 1 o'clock position-RIBBON clip): Lesion quadrant: UPPER-OUTER LEFT breast Using sterile technique and 1% Lidocaine as local anesthetic, under direct ultrasound visualization, a 12 gauge spring-loaded device was used to perform biopsy of the 1.4 cm mass at the 1 o'clock position 6 cm from the nipple using a MEDIAL approach. At the conclusion of the procedure a RIBBON tissue marker clip was deployed into the biopsy cavity. Follow up 2 view mammogram was performed and dictated separately. ULTRASOUND-GUIDED LEFT BREAST CORE NEEDLE BIOPSY #2 (0.9 cm mass at the 1:30 o'clock position-COIL clip): Lesion quadrant: UPPER-OUTER LEFT breast Using sterile technique and 1% Lidocaine as local anesthetic, under direct ultrasound visualization, a 12 gauge spring-loaded device was used to perform biopsy of the 0.9 cm mass at the 1:30 position 6 cm from the nipple using a MEDIAL approach. At the conclusion of the procedure a COIL tissue marker clip was deployed into the biopsy cavity. Follow up 2 view mammogram was performed and dictated separately. ULTRASOUND GUIDED BIOPSY OF A LEFT AXILLARY LYMPH NODE (Q CLIP): Using sterile technique and 1% Lidocaine as local anesthetic, under direct ultrasound  visualization, a 14 gauge spring-loaded device was used to perform biopsy of the LEFT axillary lymph node with focal cortical thickening using a MEDIAL approach. At the conclusion of the procedure a Q tissue marker clip was deployed into the biopsy cavity, with satisfactory placement confirmed sonographically. Follow up 2 view mammogram was performed and dictated separately. IMPRESSION: Ultrasound guided biopsy of a 1.4 cm UPPER-OUTER LEFT breast mass (  RIBBON clip), a 0.9 cm UPPER-OUTER LEFT breast mass (COIL clip), and a LEFT axillary lymph node with focal cortical thickening (Q clip). No apparent complications. Electronically Signed: By: Margarette Canada M.D. On: 09/27/2019 14:01   MM CLIP PLACEMENT LEFT  Result Date: 09/27/2019 CLINICAL DATA:  Evaluate RIBBON and COIL biopsy marker placement following ultrasound-guided LEFT breast biopsies, and Q biopsy marker placement following ultrasound-guided LEFT axillary biopsy. EXAM: DIAGNOSTIC LEFT MAMMOGRAM POST ULTRASOUND BIOPSY COMPARISON:  Previous exam(s). FINDINGS: Mammographic images were obtained following ultrasound guided biopsies of a 1.4 cm mass at the 1 o'clock position of the LEFT breast, a 0.9 cm mass at the 1:30 o'clock position of the LEFT breast and an abnormal LEFT axillary lymph node. The RIBBON biopsy marking clip is in expected position at the site of biopsy of the 1.4 cm UPPER-OUTER LEFT breast mass. The COIL biopsy marking clip is in expected position at the site of biopsy of the 0.9 cm UPPER-OUTER LEFT breast mass. The RIBBON and COIL clips are separated by a distance of 2 cm. The Q biopsy marking clip is in expected position at the site of biopsy of the LEFT axillary lymph node. IMPRESSION: Appropriate positioning of the RIBBON shaped biopsy marking clip at the site of biopsy in the UPPER OUTER LEFT breast (1.4 cm 1 o'clock position mass). Appropriate positioning of the COIL shaped biopsy marking clip at the site of biopsy in the UPPER OUTER LEFT  breast (0.9 cm 1:30 o'clock position mass). Appropriate positioning of the Q shaped biopsy marking clip at the site of biopsy in the LEFT axilla. Final Assessment: Post Procedure Mammograms for Marker Placement Electronically Signed   By: Margarette Canada M.D.   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 This was found to be concordant by Dr. Hassan Rowan. 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 This was found to be concordant by Dr. Hassan Rowan. Pathology revealed LYMPH NODE TISSUE WITH NO METASTATIC CARCINOMA IDENTIFIED of the LEFT axilla. This was found to be concordant by Dr. Hassan Rowan. Pathology results were discussed with the patient by telephone. The patient reported doing well after the biopsies with tenderness at the sites. Post biopsy instructions and care were reviewed and questions were answered. The patient was encouraged to call [facility] for any additional concerns. My direct phone number was provided. The patient was referred to [facility] on October 09, 2019. Pathology results reported by Terie Purser, RN on 09/30/2019. Electronically Signed   By: Margarette Canada M.D.   On: 09/30/2019 13:32   Result Date: 09/30/2019 CLINICAL DATA:  69 year old female for tissue sampling of 1.4 cm UPPER-OUTER LEFT breast mass, 0.9 cm UPPER-OUTER LEFT breast mass and LEFT axillary lymph node with thickened cortex. EXAM: ULTRASOUND GUIDED LEFT BREAST CORE NEEDLE BIOPSY X 2 [place] AXILLARY NODE CORE BIOPSY LEFT COMPARISON:  Previous exam(s). PROCEDURE: I met with the patient and we discussed the procedure of ultrasound-guided biopsy,  including benefits and alternatives. We discussed the high likelihood of a successful procedure. We discussed the risks of the procedure, including infection, bleeding, tissue injury, clip migration, and inadequate sampling. Informed  written consent was given. The usual time-out protocol was performed immediately prior to the procedure. ULTRASOUND GUIDED LEFT BREAST CORE NEEDLE BIOPSY #1 (1.4 cm mass at the 1 o'clock position-RIBBON clip): Lesion quadrant: UPPER-OUTER LEFT breast Using sterile technique and 1% Lidocaine as local anesthetic, under direct ultrasound visualization, a 12 gauge spring-loaded device was used to perform biopsy of the 1.4 cm mass at the 1 o'clock position 6 cm from the nipple using a MEDIAL approach. At the conclusion of the procedure a RIBBON tissue marker clip was deployed into the biopsy cavity. Follow up 2 view mammogram was performed and dictated separately. ULTRASOUND-GUIDED LEFT BREAST CORE NEEDLE BIOPSY #2 (0.9 cm mass at the 1:30 o'clock position-COIL clip): Lesion quadrant: UPPER-OUTER LEFT breast Using sterile technique and 1% Lidocaine as local anesthetic, under direct ultrasound visualization, a 12 gauge spring-loaded device was used to perform biopsy of the 0.9 cm mass at the 1:30 position 6 cm from the nipple using a MEDIAL approach. At the conclusion of the procedure a COIL tissue marker clip was deployed into the biopsy cavity. Follow up 2 view mammogram was performed and dictated separately. ULTRASOUND GUIDED BIOPSY OF A LEFT AXILLARY LYMPH NODE (Q CLIP): Using sterile technique and 1% Lidocaine as local anesthetic, under direct ultrasound visualization, a 14 gauge spring-loaded device was used to perform biopsy of the LEFT axillary lymph node with focal cortical thickening using a MEDIAL approach. At the conclusion of the procedure a Q tissue marker clip was deployed into the biopsy cavity, with satisfactory placement confirmed sonographically. Follow up 2 view mammogram  was performed and dictated separately. IMPRESSION: Ultrasound guided biopsy of a 1.4 cm UPPER-OUTER LEFT breast mass (RIBBON clip), a 0.9 cm UPPER-OUTER LEFT breast mass (COIL clip), and a LEFT axillary lymph node with focal cortical thickening (Q clip). No apparent complications. Electronically Signed: By: Margarette Canada M.D. 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 This was found to be concordant by Dr. Hassan Rowan. 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 This was found to be concordant by Dr. Hassan Rowan. Pathology revealed LYMPH NODE TISSUE WITH NO METASTATIC CARCINOMA IDENTIFIED of the LEFT axilla. This was found to be concordant by Dr. Hassan Rowan. Pathology results were discussed with the patient by telephone. The patient reported doing well after the biopsies with tenderness at the sites. Post biopsy instructions and care were reviewed and questions were answered. The patient was encouraged to call [facility] for any additional concerns. My direct phone number was provided. The patient was referred to [facility] at [facility] on October 09, 2019. Pathology results reported by Terie Purser, RN on 09/30/2019. Electronically Signed   By: Margarette Canada M.D.   On: 09/30/2019 13:32   Result Date: 09/30/2019 CLINICAL DATA:  69 year old female for tissue sampling of 1.4 cm UPPER-OUTER LEFT breast mass, 0.9 cm UPPER-OUTER LEFT breast mass and LEFT axillary lymph node with thickened cortex. EXAM: ULTRASOUND GUIDED LEFT BREAST CORE NEEDLE BIOPSY X 2 [place] AXILLARY NODE CORE BIOPSY LEFT COMPARISON:  Previous exam(s). PROCEDURE: I met with the  patient and we discussed the procedure of ultrasound-guided biopsy, including benefits  and alternatives. We discussed the high likelihood of a successful procedure. We discussed the risks of the procedure, including infection, bleeding, tissue injury, clip migration, and inadequate sampling. Informed written consent was given. The usual time-out protocol was performed immediately prior to the procedure. ULTRASOUND GUIDED LEFT BREAST CORE NEEDLE BIOPSY #1 (1.4 cm mass at the 1 o'clock position-RIBBON clip): Lesion quadrant: UPPER-OUTER LEFT breast Using sterile technique and 1% Lidocaine as local anesthetic, under direct ultrasound visualization, a 12 gauge spring-loaded device was used to perform biopsy of the 1.4 cm mass at the 1 o'clock position 6 cm from the nipple using a MEDIAL approach. At the conclusion of the procedure a RIBBON tissue marker clip was deployed into the biopsy cavity. Follow up 2 view mammogram was performed and dictated separately. ULTRASOUND-GUIDED LEFT BREAST CORE NEEDLE BIOPSY #2 (0.9 cm mass at the 1:30 o'clock position-COIL clip): Lesion quadrant: UPPER-OUTER LEFT breast Using sterile technique and 1% Lidocaine as local anesthetic, under direct ultrasound visualization, a 12 gauge spring-loaded device was used to perform biopsy of the 0.9 cm mass at the 1:30 position 6 cm from the nipple using a MEDIAL approach. At the conclusion of the procedure a COIL tissue marker clip was deployed into the biopsy cavity. Follow up 2 view mammogram was performed and dictated separately. ULTRASOUND GUIDED BIOPSY OF A LEFT AXILLARY LYMPH NODE (Q CLIP): Using sterile technique and 1% Lidocaine as local anesthetic, under direct ultrasound visualization, a 14 gauge spring-loaded device was used to perform biopsy of the LEFT axillary lymph node with focal cortical thickening using a MEDIAL approach. At the conclusion of the procedure a Q tissue marker clip was deployed into the biopsy cavity, with  satisfactory placement confirmed sonographically. Follow up 2 view mammogram was performed and dictated separately. IMPRESSION: Ultrasound guided biopsy of a 1.4 cm UPPER-OUTER LEFT breast mass (RIBBON clip), a 0.9 cm UPPER-OUTER LEFT breast mass (COIL clip), and a LEFT axillary lymph node with focal cortical thickening (Q clip). No apparent complications. Electronically Signed: By: Margarette Canada M.D. On: 09/27/2019 14:01    Labs:  CBC: Recent Labs    10/09/19 0806 10/18/19 1005  WBC 5.2 5.4  HGB 13.0 13.7  HCT 39.8 41.5  PLT 180 181    COAGS: Recent Labs    10/18/19 1005  INR 1.0    BMP: Recent Labs    10/09/19 0806  NA 140  K 3.9  CL 107  CO2 25  GLUCOSE 95  BUN 22  CALCIUM 9.2  CREATININE 1.11*  GFRNONAA 50*  GFRAA 58*    LIVER FUNCTION TESTS: Recent Labs    10/09/19 0806  BILITOT 0.3  AST 18  ALT 17  ALKPHOS 77  PROT 7.4  ALBUMIN 4.0    TUMOR MARKERS: No results for input(s): AFPTM, CEA, CA199, CHROMGRNA in the last 8760 hours.  Assessment and Plan: 69 y.o. female with history of newly diagnosed left breast carcinoma who presents today for Port-A-Cath placement for chemotherapy.Risks and benefits of image guided port-a-catheter placement was discussed with the patient including, but not limited to bleeding, infection, pneumothorax, or fibrin sheath development and need for additional procedures.  All of the patient's questions were answered, patient is agreeable to proceed. Consent signed and in chart.     Thank you for this interesting consult.  I greatly enjoyed meeting Cheyenne Mcdonald and look forward to participating in their care.  A copy of this report was sent to the requesting provider on this date.  Electronically Signed: D. Rowe Robert, PA-C 10/18/2019, 10:58 AM   I spent a total of 25 minutes in face to face in clinical consultation, greater than 50% of which was counseling/coordinating care for Port-A-Cath placement

## 2019-10-21 ENCOUNTER — Other Ambulatory Visit: Payer: Self-pay

## 2019-10-21 ENCOUNTER — Ambulatory Visit (HOSPITAL_COMMUNITY)
Admission: RE | Admit: 2019-10-21 | Discharge: 2019-10-21 | Disposition: A | Discharge status: Home / Self Care | Payer: Medicare HMO | Source: Ambulatory Visit | Attending: Oncology | Admitting: Oncology

## 2019-10-21 ENCOUNTER — Telehealth: Payer: Self-pay | Admitting: Oncology

## 2019-10-21 DIAGNOSIS — Z171 Estrogen receptor negative status [ER-]: Secondary | ICD-10-CM | POA: Insufficient documentation

## 2019-10-21 DIAGNOSIS — C50412 Malignant neoplasm of upper-outer quadrant of left female breast: Secondary | ICD-10-CM

## 2019-10-21 DIAGNOSIS — D0512 Intraductal carcinoma in situ of left breast: Secondary | ICD-10-CM | POA: Diagnosis not present

## 2019-10-21 IMAGING — MR MR BREAST BILAT WO/W CM
6 of 10 series · 27 of 48 positions shown · IV contrast (gadavist)
Comparison: Previous exam(s).

CLINICAL DATA: Recently diagnosed left breast invasive ductal
carcinoma, here for evaluation of extent of disease. Two site left
breast biopsy of masses at 1 o'clock 6 cm from the nipple marked
with a ribbon clip and at 1:30 o'clock 6 cm from the nipple marked
with a coil clip demonstrated invasive ductal carcinoma at both
sites. Biopsy of a left axillary lymph node did not demonstrate any
evidence of metastatic disease.

LABS:  eGFR 50 mL/min on [DATE]
EXAM:
BILATERAL BREAST MRI WITH AND WITHOUT CONTRAST
TECHNIQUE: Multiplanar, multisequence MR images of both breasts were obtained
prior to and following the intravenous administration of 8 ml of
Gadavist

[Series 2: T2 · axial · 3.0mm · 0.89mm/px · z∈[-132,+45]mm · 2 of 45 slices shown]
[im 1/45]
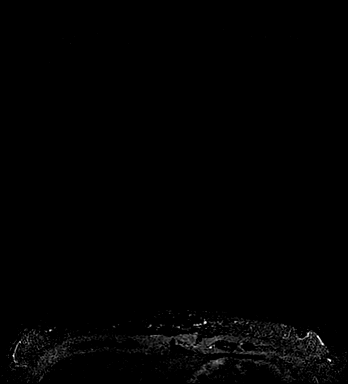
[im 45/45]
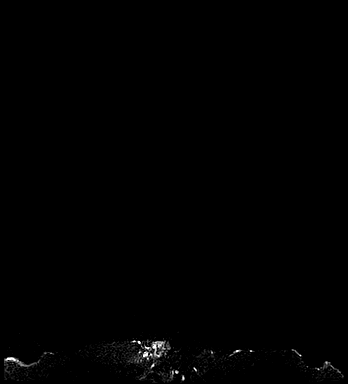

[Series 3: T1 fat-sat · axial · 1.2mm · 0.62mm/px · z∈[-128,+44]mm · 6 of 144 slices shown (1 of 4)]
[im 1/144]
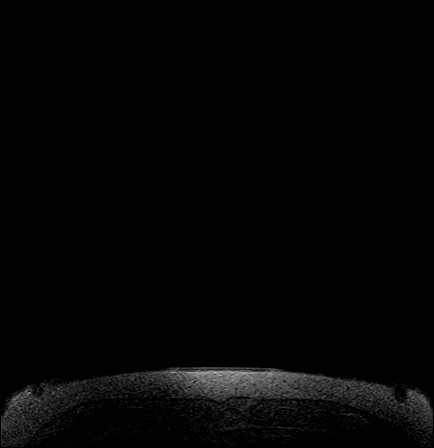
[im 29/144]
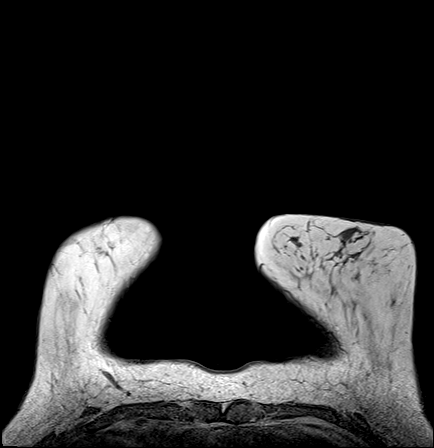
[im 58/144]
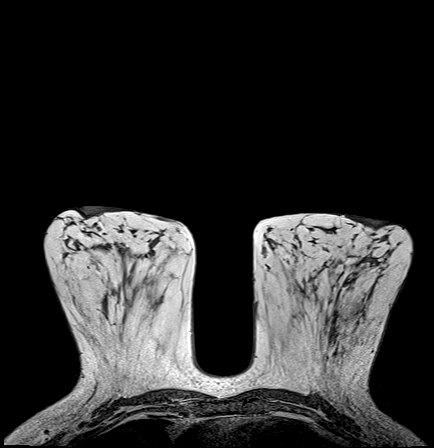
[im 86/144]
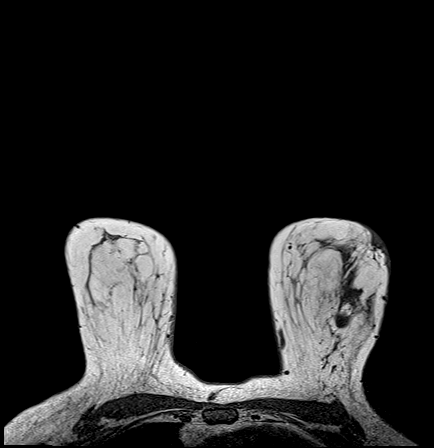
[im 115/144]
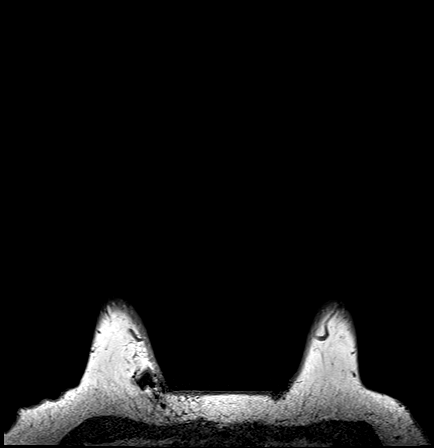
[im 144/144]
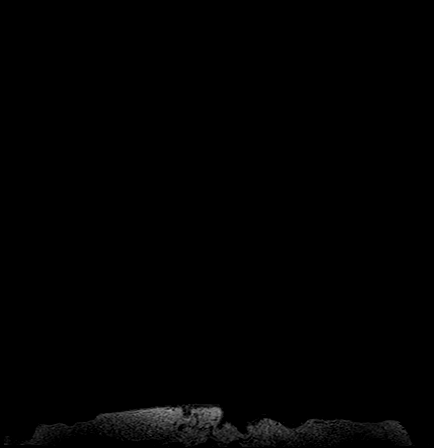

[Series 4: T1 fat-sat · axial · 1.6mm · 0.67mm/px · z∈[-131,+47]mm · 5 of 106 slices shown (2 of 4)]
[im 1/106]
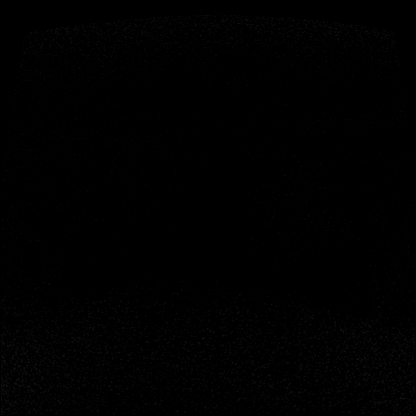
[im 27/106]
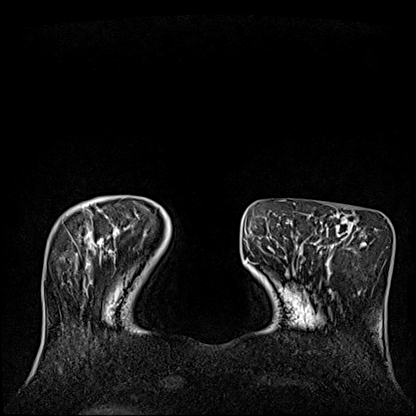
[im 53/106]
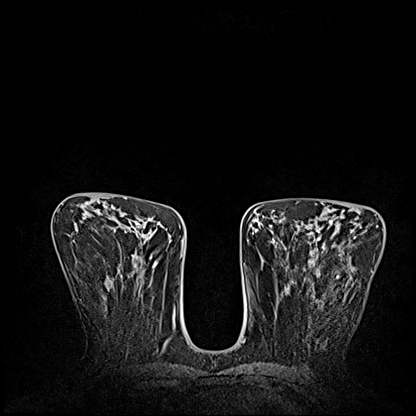
[im 79/106]
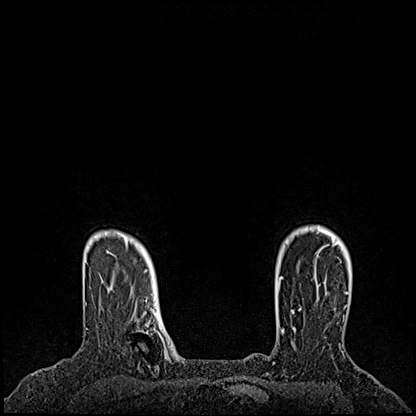
[im 106/106]
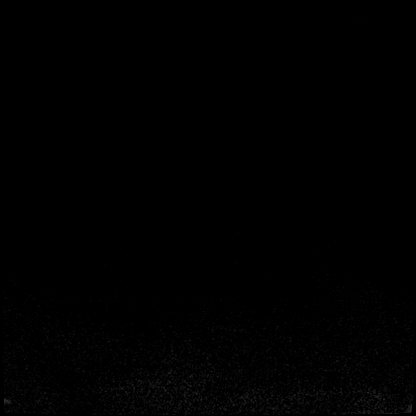

[Series 5: T1 fat-sat · axial · 1.6mm · 0.67mm/px · z∈[-131,+47]mm · 5 of 107 slices shown (3 of 4)]
[im 1/107]
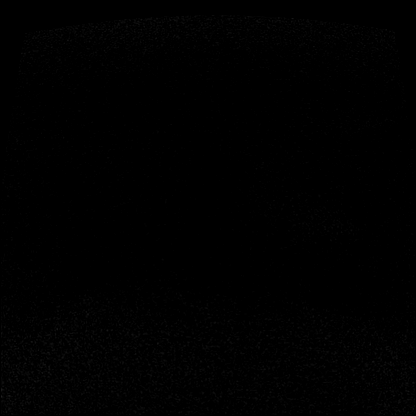
[im 27/107]
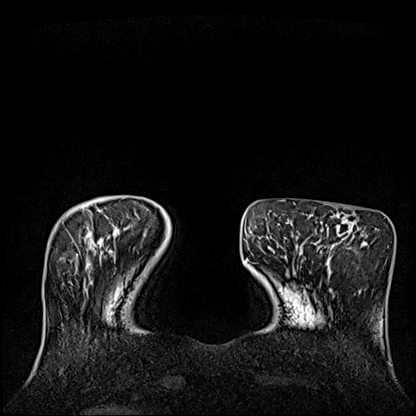
[im 54/107]
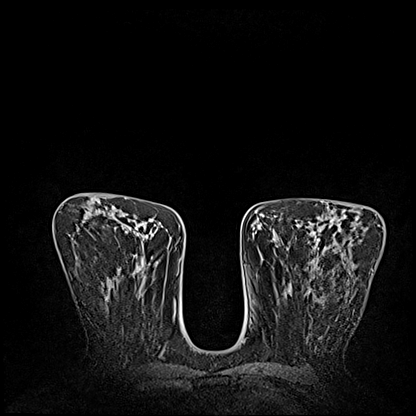
[im 80/107]
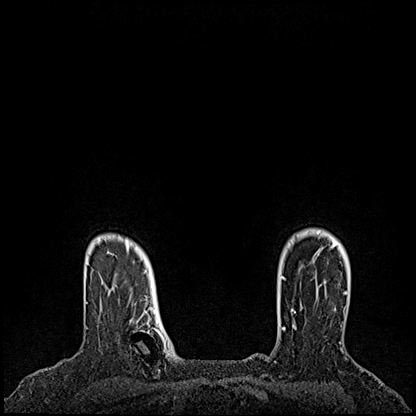
[im 107/107]
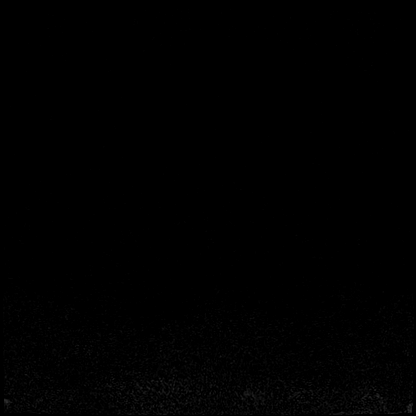

[Series 6: T1 fat-sat · axial · 1.6mm · 0.67mm/px · z∈[-131,+47]mm · 5 of 112 slices shown (4 of 4)]
[im 1/112]
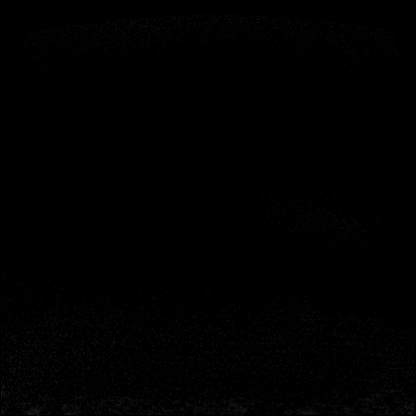
[im 28/112]
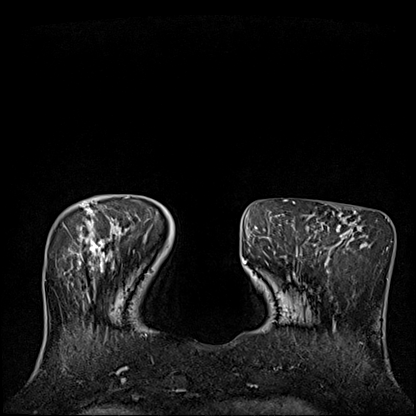
[im 56/112]
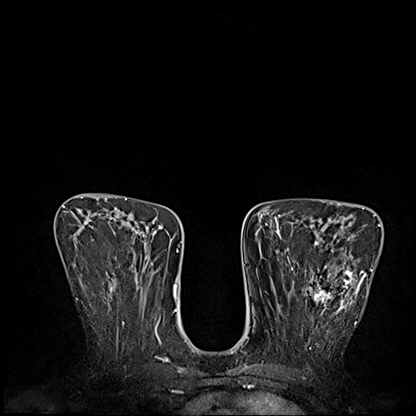
[im 84/112]
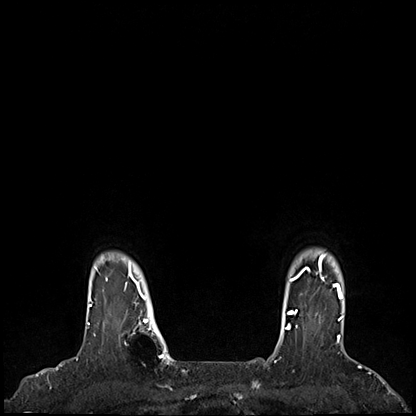
[im 112/112]
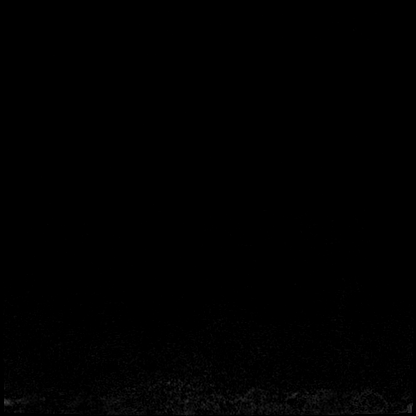

[Series 7: T1 · axial · 1.6mm · 0.67mm/px · z∈[-131,+2]mm · 4 of 112 slices shown]
[im 1/112]
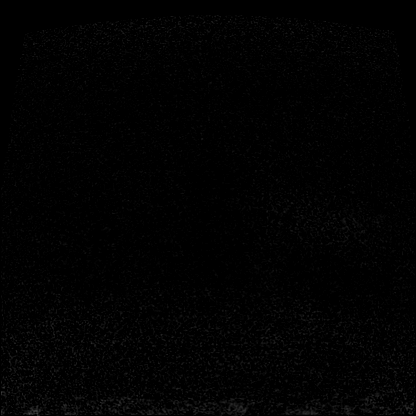
[im 28/112]
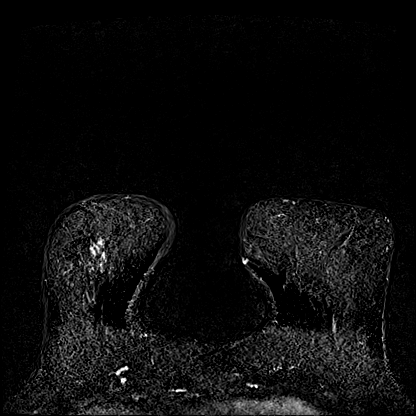
[im 56/112]
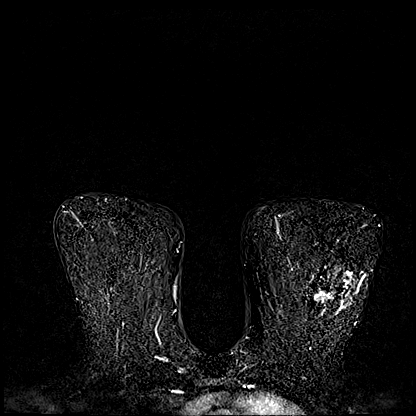
[im 84/112]
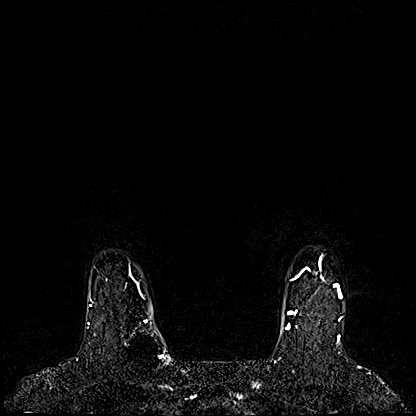

[27 of 48 positions shown; findings below may reference images not displayed]

Three-dimensional MR images were rendered by post-processing of the
original MR data on an independent workstation. The
three-dimensional MR images were interpreted, and findings are
reported in the following complete MRI report for this study. Three
dimensional images were evaluated at the independent interpreting
workstation using the DynaCAD thin client.
FINDINGS: Breast composition: b. Scattered fibroglandular tissue.

Background parenchymal enhancement: Mild

Right breast: No mass or abnormal enhancement. A central venous port
catheter is located in the upper right chest and partially imaged.

Left breast: An irregular enhancing mass in the upper outer left
breast at middle depth measures 1.8 x 1.5 (series 7, image 51) x
cm (sagittal reconstruction) and is consistent with the patient's
biopsy proven malignancy. Magnetic susceptibility within the mass
corresponds to the ribbon shaped marking clip.

An irregular enhancing mass in the upper outer left breast at middle
to anterior depth is located lateral and inferior to this mass,
measuring 0.9 x 0.8 (series 7, image 55) x 1.0 cm (sagittal
reconstruction), and is consistent with the patient's biopsy proven
malignancy. Magnetic susceptibility within the mass corresponds to
the coil shaped marking clip. Together, these two masses
representing the patient's known malignancy span 3.3 cm in greatest
dimension on an oblique reformat plane.

There is surrounding heterogeneous non mass enhancement primarily
located superior to these masses which in total spans approximately
2.4 cm TV (series 7, image 45) x 4.5 cm CC x 5.4 cm AP (sagittal
reconstruction). This is suspicious for malignancy.

Lymph nodes: No abnormal appearing lymph nodes, however both axilla
are incompletely imaged on this exam.

Ancillary findings:  None.
IMPRESSION: 1. Two irregular masses in the upper outer left breast representing
the patient's known malignancy. Surrounding heterogeneous non mass
enhancement primarily located superior to these masses spans 5.4 cm
in greatest dimension and is suspicious for malignancy.

2.  No evidence of malignancy in the right breast.

RECOMMENDATION:
Recommend continued surgical/oncologic management. If the patient is
a candidate for breast conserving surgery, MR guided biopsy could be
performed of the non mass enhancement located superior to the two
sites of biopsy proven malignancy to confirm extent of disease.

BI-RADS CATEGORY  4: Suspicious.

## 2019-10-21 MED ORDER — GADOBUTROL 1 MMOL/ML IV SOLN
8.0000 mL | Freq: Once | INTRAVENOUS | Status: AC | PRN
  Administered 2019-10-21: 8 mL via INTRAVENOUS

## 2019-10-21 NOTE — Telephone Encounter (Signed)
No 10/8 los, no changes made to pt schedule

## 2019-10-22 ENCOUNTER — Other Ambulatory Visit: Payer: Self-pay

## 2019-10-22 ENCOUNTER — Encounter: Payer: Self-pay | Admitting: *Deleted

## 2019-10-22 ENCOUNTER — Inpatient Hospital Stay: Payer: Medicare HMO

## 2019-10-22 ENCOUNTER — Ambulatory Visit (HOSPITAL_COMMUNITY)
Admission: RE | Admit: 2019-10-22 | Discharge: 2019-10-22 | Disposition: A | Discharge status: Home / Self Care | Payer: Medicare HMO | Source: Ambulatory Visit | Attending: Oncology | Admitting: Oncology

## 2019-10-22 VITALS — BP 142/74 | HR 60 | Temp 98.6°F | Resp 20

## 2019-10-22 DIAGNOSIS — Z171 Estrogen receptor negative status [ER-]: Secondary | ICD-10-CM

## 2019-10-22 DIAGNOSIS — C50412 Malignant neoplasm of upper-outer quadrant of left female breast: Secondary | ICD-10-CM

## 2019-10-22 DIAGNOSIS — Z452 Encounter for adjustment and management of vascular access device: Secondary | ICD-10-CM | POA: Diagnosis not present

## 2019-10-22 DIAGNOSIS — Z79899 Other long term (current) drug therapy: Secondary | ICD-10-CM | POA: Diagnosis not present

## 2019-10-22 DIAGNOSIS — E669 Obesity, unspecified: Secondary | ICD-10-CM | POA: Diagnosis not present

## 2019-10-22 DIAGNOSIS — Z95828 Presence of other vascular implants and grafts: Secondary | ICD-10-CM | POA: Insufficient documentation

## 2019-10-22 DIAGNOSIS — Z5111 Encounter for antineoplastic chemotherapy: Secondary | ICD-10-CM | POA: Diagnosis not present

## 2019-10-22 DIAGNOSIS — Z5189 Encounter for other specified aftercare: Secondary | ICD-10-CM | POA: Diagnosis not present

## 2019-10-22 DIAGNOSIS — G4730 Sleep apnea, unspecified: Secondary | ICD-10-CM | POA: Diagnosis not present

## 2019-10-22 DIAGNOSIS — Z7689 Persons encountering health services in other specified circumstances: Secondary | ICD-10-CM | POA: Diagnosis not present

## 2019-10-22 HISTORY — DX: Presence of other vascular implants and grafts: Z95.828

## 2019-10-22 LAB — CMP (CANCER CENTER ONLY)
ALT: 13 U/L (ref 0–44)
AST: 14 U/L — ABNORMAL LOW (ref 15–41)
Albumin: 3.7 g/dL (ref 3.5–5.0)
Alkaline Phosphatase: 71 U/L (ref 38–126)
Anion gap: 4 — ABNORMAL LOW (ref 5–15)
BUN: 21 mg/dL (ref 8–23)
CO2: 27 mmol/L (ref 22–32)
Calcium: 9.7 mg/dL (ref 8.9–10.3)
Chloride: 108 mmol/L (ref 98–111)
Creatinine: 0.95 mg/dL (ref 0.44–1.00)
GFR, Estimated: >60 mL/min (ref >60)
Glucose, Bld: 109 mg/dL — ABNORMAL HIGH (ref 70–99)
Potassium: 3.7 mmol/L (ref 3.5–5.1)
Sodium: 139 mmol/L (ref 135–145)
Total Bilirubin: 0.3 mg/dL (ref 0.3–1.2)
Total Protein: 7.1 g/dL (ref 6.5–8.1)

## 2019-10-22 LAB — CBC WITH DIFFERENTIAL (CANCER CENTER ONLY)
Abs Immature Granulocytes: 0.02 10*3/uL (ref 0.00–0.07)
Basophils Absolute: 0 10*3/uL (ref 0.0–0.1)
Basophils Relative: 0 %
Eosinophils Absolute: 0.1 10*3/uL (ref 0.0–0.5)
Eosinophils Relative: 2 %
HCT: 38.5 % (ref 36.0–46.0)
Hemoglobin: 12.9 g/dL (ref 12.0–15.0)
Immature Granulocytes: 0 %
Lymphocytes Relative: 26 %
Lymphs Abs: 1.3 10*3/uL (ref 0.7–4.0)
MCH: 31.4 pg (ref 26.0–34.0)
MCHC: 33.5 g/dL (ref 30.0–36.0)
MCV: 93.7 fL (ref 80.0–100.0)
Monocytes Absolute: 0.5 10*3/uL (ref 0.1–1.0)
Monocytes Relative: 10 %
Neutro Abs: 3.2 10*3/uL (ref 1.7–7.7)
Neutrophils Relative %: 62 %
Platelet Count: 164 10*3/uL (ref 150–400)
RBC: 4.11 MIL/uL (ref 3.87–5.11)
RDW: 12.3 % (ref 11.5–15.5)
WBC Count: 5.2 10*3/uL (ref 4.0–10.5)
nRBC: 0 % (ref 0.0–0.2)

## 2019-10-22 IMAGING — DX DG CHEST 1V
1 series · 1 of 1 positions shown · non-contrast
Comparison: [CB]

CLINICAL DATA: Port placement

EXAM:
CHEST  1 VIEW

[chest pa]
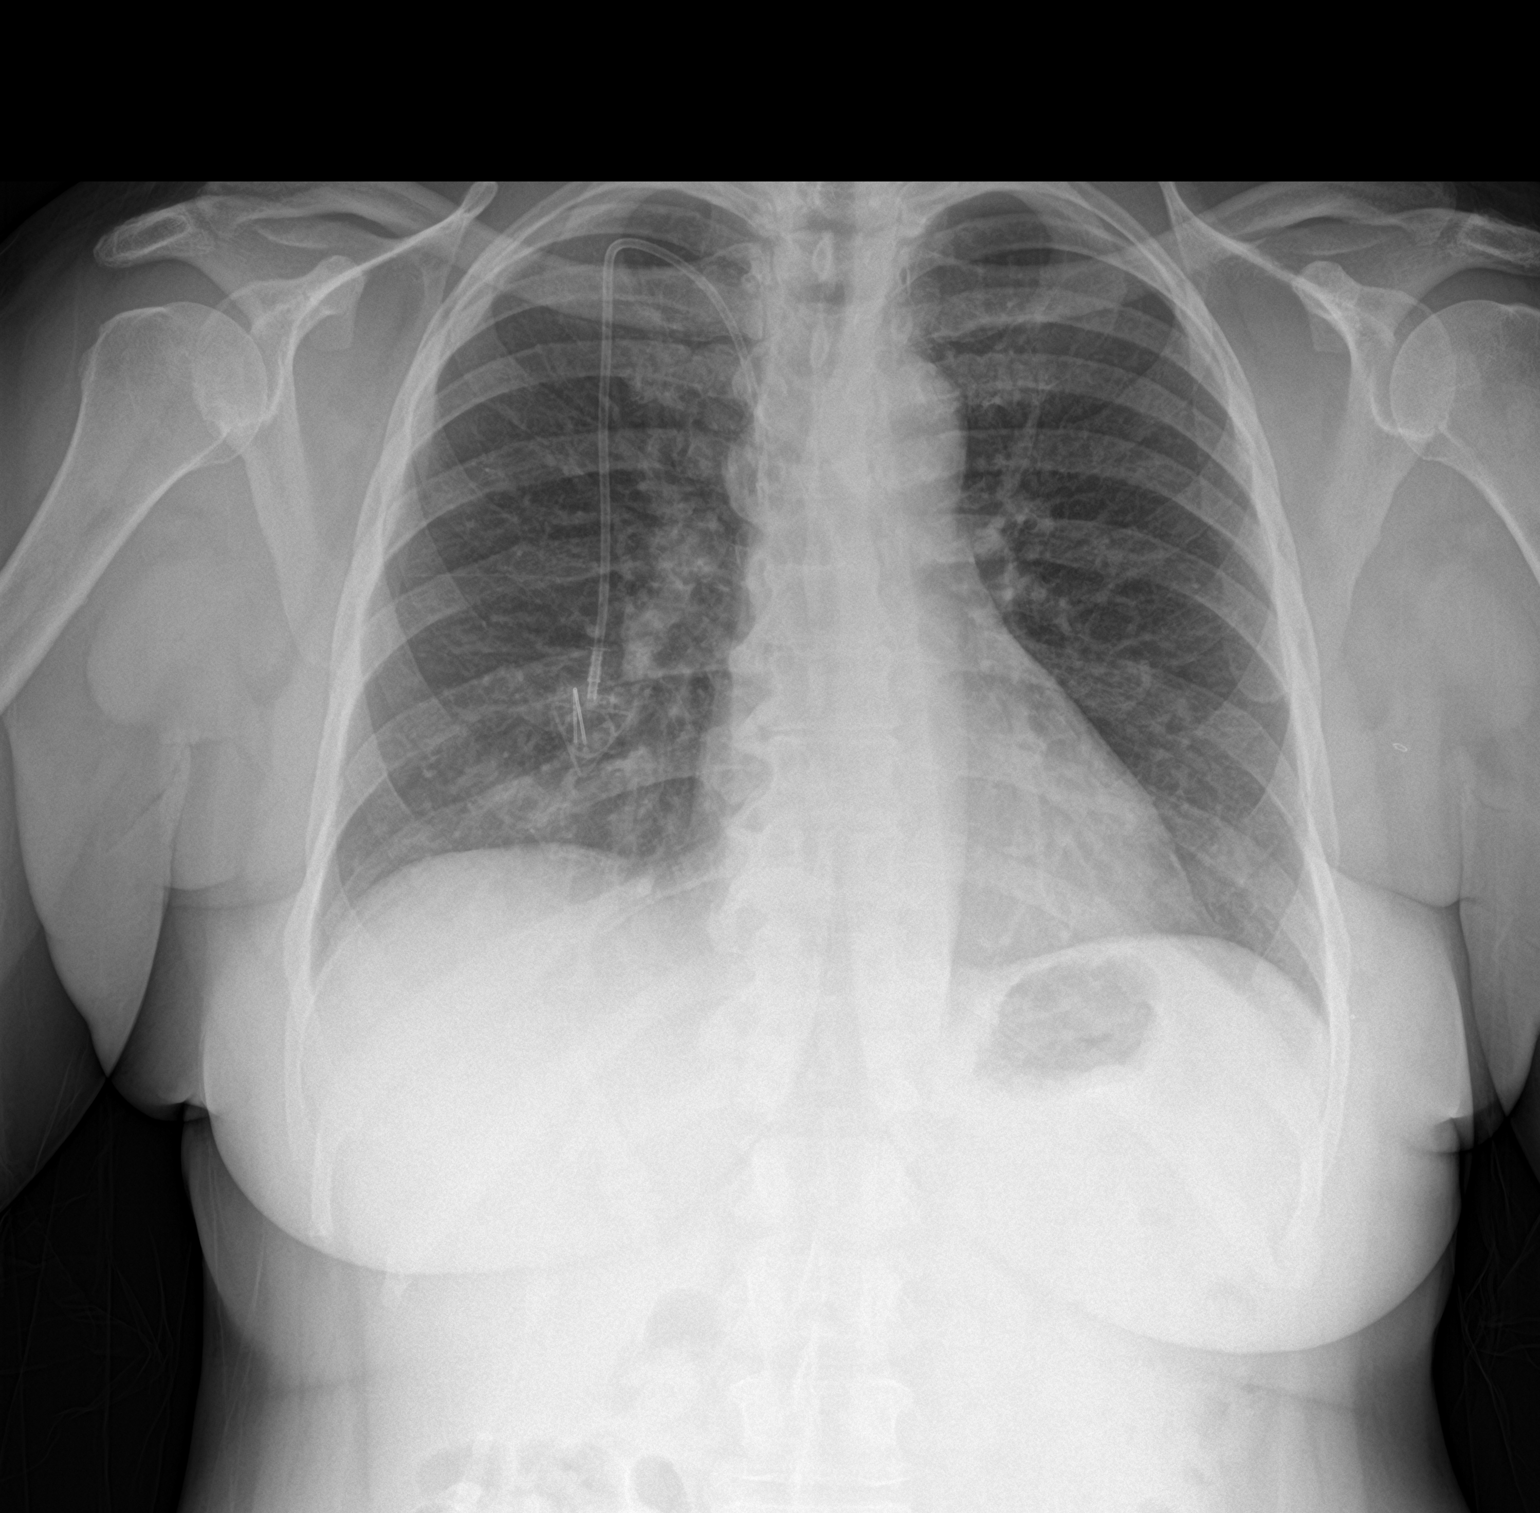

[1 of 1 positions shown; findings below may reference images not displayed]

FINDINGS: Right chest wall port catheter tip overlies the SVC. No
pneumothorax. Lungs are clear. No pleural effusion.
Cardiomediastinal contours are within normal limits.
IMPRESSION: Right chest wall port catheter tip overlies SVC.  No pneumothorax.

## 2019-10-22 MED ORDER — SODIUM CHLORIDE 0.9 % IV SOLN
600.0000 mg/m2 | Freq: Once | INTRAVENOUS | Status: AC
  Administered 2019-10-22: 1160 mg via INTRAVENOUS
  Filled 2019-10-22: qty 58

## 2019-10-22 MED ORDER — PALONOSETRON HCL INJECTION 0.25 MG/5ML
0.2500 mg | Freq: Once | INTRAVENOUS | Status: AC
  Administered 2019-10-22: 0.25 mg via INTRAVENOUS

## 2019-10-22 MED ORDER — PALONOSETRON HCL INJECTION 0.25 MG/5ML
INTRAVENOUS | Status: AC
  Filled 2019-10-22: qty 5

## 2019-10-22 MED ORDER — HEPARIN SOD (PORK) LOCK FLUSH 100 UNIT/ML IV SOLN
500.0000 [IU] | Freq: Once | INTRAVENOUS | Status: AC | PRN
  Administered 2019-10-22: 500 [IU]
  Filled 2019-10-22: qty 5

## 2019-10-22 MED ORDER — SODIUM CHLORIDE 0.9% FLUSH
10.0000 mL | Freq: Once | INTRAVENOUS | Status: AC
  Administered 2019-10-22: 10 mL
  Filled 2019-10-22: qty 10

## 2019-10-22 MED ORDER — SODIUM CHLORIDE 0.9% FLUSH
10.0000 mL | INTRAVENOUS | Status: DC | PRN
  Administered 2019-10-22: 10 mL
  Filled 2019-10-22: qty 10

## 2019-10-22 MED ORDER — SODIUM CHLORIDE 0.9 % IV SOLN
Freq: Once | INTRAVENOUS | Status: AC
  Filled 2019-10-22: qty 250

## 2019-10-22 MED ORDER — SODIUM CHLORIDE 0.9 % IV SOLN
10.0000 mg | Freq: Once | INTRAVENOUS | Status: AC
  Administered 2019-10-22: 10 mg via INTRAVENOUS
  Filled 2019-10-22: qty 10

## 2019-10-22 MED ORDER — DOXORUBICIN HCL CHEMO IV INJECTION 2 MG/ML
60.0000 mg/m2 | Freq: Once | INTRAVENOUS | Status: AC
  Administered 2019-10-22: 116 mg via INTRAVENOUS
  Filled 2019-10-22: qty 58

## 2019-10-22 MED ORDER — SODIUM CHLORIDE 0.9 % IV SOLN
150.0000 mg | Freq: Once | INTRAVENOUS | Status: AC
  Administered 2019-10-22: 150 mg via INTRAVENOUS
  Filled 2019-10-22: qty 150

## 2019-10-22 NOTE — Patient Instructions (Signed)
[facility] Discharge Instructions for Patients Receiving Chemotherapy  Today you received the following chemotherapy agents Adriamycin and Cytoxan  To help prevent nausea and vomiting after your treatment, we encourage you to take your nausea medication as directed   If you develop nausea and vomiting that is not controlled by your nausea medication, call the clinic.   BELOW ARE SYMPTOMS THAT SHOULD BE REPORTED IMMEDIATELY:  *FEVER GREATER THAN 100.5 F  *CHILLS WITH OR WITHOUT FEVER  NAUSEA AND VOMITING THAT IS NOT CONTROLLED WITH YOUR NAUSEA MEDICATION  *UNUSUAL SHORTNESS OF BREATH  *UNUSUAL BRUISING OR BLEEDING  TENDERNESS IN MOUTH AND THROAT WITH OR WITHOUT PRESENCE OF ULCERS  *URINARY PROBLEMS  *BOWEL PROBLEMS  UNUSUAL RASH Items with * indicate a potential emergency and should be followed up as soon as possible.  Feel free to call the clinic should you have any questions or concerns. The clinic phone number is [phone].  Please show the [facility] at check-in to the Emergency Department and triage nurse.  Doxorubicin injection What is this medicine? DOXORUBICIN (dox oh ROO bi sin) is a chemotherapy drug. It is used to treat many kinds of cancer like leukemia, lymphoma, neuroblastoma, sarcoma, and Wilms' tumor. It is also used to treat bladder cancer, breast cancer, lung cancer, ovarian cancer, stomach cancer, and thyroid cancer. This medicine may be used for other purposes; ask your health care provider or pharmacist if you have questions. COMMON BRAND NAME(S): Adriamycin, Adriamycin PFS, Adriamycin RDF, Rubex What should I tell my health care provider before I take this medicine? They need to know if you have any of these conditions:  heart disease  history of low blood counts caused by a medicine  liver disease  recent or ongoing radiation therapy  an unusual or allergic reaction to doxorubicin, other chemotherapy agents, other  medicines, foods, dyes, or preservatives  pregnant or trying to get pregnant  breast-feeding How should I use this medicine? This drug is given as an infusion into a vein. It is administered in a hospital or clinic by a specially trained health care professional. If you have pain, swelling, burning or any unusual feeling around the site of your injection, tell your health care professional right away. Talk to your pediatrician regarding the use of this medicine in children. Special care may be needed. Overdosage: If you think you have taken too much of this medicine contact a poison control center or emergency room at once. NOTE: This medicine is only for you. Do not share this medicine with others. What if I miss a dose? It is important not to miss your dose. Call your doctor or health care professional if you are unable to keep an appointment. What may interact with this medicine? This medicine may interact with the following medications:  6-mercaptopurine  paclitaxel  phenytoin  St. John's Wort  trastuzumab  verapamil This list may not describe all possible interactions. Give your health care provider a list of all the medicines, herbs, non-prescription drugs, or dietary supplements you use. Also tell them if you smoke, drink alcohol, or use illegal drugs. Some items may interact with your medicine. What should I watch for while using this medicine? This drug may make you feel generally unwell. This is not uncommon, as chemotherapy can affect healthy cells as well as cancer cells. Report any side effects. Continue your course of treatment even though you feel ill unless your doctor tells you to stop. There is a maximum amount  this medicine you should receive throughout your life. The amount depends on the medical condition being treated and your overall health. Your doctor will watch how much of this medicine you receive in your lifetime. Tell your doctor if you have taken this  medicine before. You may need blood work done while you are taking this medicine. Your urine may turn red for a few days after your dose. This is not blood. If your urine is dark or brown, call your doctor. In some cases, you may be given additional medicines to help with side effects. Follow all directions for their use. Call your doctor or health care professional for advice if you get a fever, chills or sore throat, or other symptoms of a cold or flu. Do not treat yourself. This drug decreases your body's ability to fight infections. Try to avoid being around people who are sick. This medicine may increase your risk to bruise or bleed. Call your doctor or health care professional if you notice any unusual bleeding. Talk to your doctor about your risk of cancer. You may be more at risk for certain types of cancers if you take this medicine. Do not become pregnant while taking this medicine or for 6 months after stopping it. Women should inform their doctor if they wish to become pregnant or think they might be pregnant. Men should not father a child while taking this medicine and for 6 months after stopping it. There is a potential for serious side effects to an unborn child. Talk to your health care professional or pharmacist for more information. Do not breast-feed an infant while taking this medicine. This medicine has caused ovarian failure in some women and reduced sperm counts in some men This medicine may interfere with the ability to have a child. Talk with your doctor or health care professional if you are concerned about your fertility. This medicine may cause a decrease in Co-Enzyme Q-10. You should make sure that you get enough Co-Enzyme Q-10 while you are taking this medicine. Discuss the foods you eat and the vitamins you take with your health care professional. What side effects may I notice from receiving this medicine? Side effects that you should report to your doctor or health care  professional as soon as possible:  allergic reactions like skin rash, itching or hives, swelling of the face, lips, or tongue  breathing problems  chest pain  fast or irregular heartbeat  low blood counts - this medicine may decrease the number of white blood cells, red blood cells and platelets. You may be at increased risk for infections and bleeding.  pain, redness, or irritation at site where injected  signs of infection - fever or chills, cough, sore throat, pain or difficulty passing urine  signs of decreased platelets or bleeding - bruising, pinpoint red spots on the skin, black, tarry stools, blood in the urine  swelling of the ankles, feet, hands  tiredness  weakness Side effects that usually do not require medical attention (report to your doctor or health care professional if they continue or are bothersome):  diarrhea  hair loss  mouth sores  nail discoloration or damage  nausea  red colored urine  vomiting This list may not describe all possible side effects. Call your doctor for medical advice about side effects. You may report side effects to FDA at 1-800-FDA-1088. Where should I keep my medicine? This drug is given in a hospital or clinic and will not be stored at home. NOTE:   NOTE: This sheet is a summary. It may not cover all possible information. If you have questions about this medicine, talk to your doctor, pharmacist, or health care provider.  2020 Elsevier/Gold Standard (2016-08-10 11:01:26)   Cyclophosphamide Injection What is this medicine? CYCLOPHOSPHAMIDE (sye kloe FOSS fa mide) is a chemotherapy drug. It slows the growth of cancer cells. This medicine is used to treat many types of cancer like lymphoma, myeloma, leukemia, breast cancer, and ovarian cancer, to name a few. This medicine may be used for other purposes; ask your health care provider or pharmacist if you have questions. COMMON BRAND NAME(S): Cytoxan, Neosar What should I tell my  health care provider before I take this medicine? They need to know if you have any of these conditions:  heart disease  history of irregular heartbeat  infection  kidney disease  liver disease  low blood counts, like white cells, platelets, or red blood cells  on hemodialysis  recent or ongoing radiation therapy  scarring or thickening of the lungs  trouble passing urine  an unusual or allergic reaction to cyclophosphamide, other medicines, foods, dyes, or preservatives  pregnant or trying to get pregnant  breast-feeding How should I use this medicine? This drug is usually given as an injection into a vein or muscle or by infusion into a vein. It is administered in a hospital or clinic by a specially trained health care professional. Talk to your pediatrician regarding the use of this medicine in children. Special care may be needed. Overdosage: If you think you have taken too much of this medicine contact a poison control center or emergency room at once. NOTE: This medicine is only for you. Do not share this medicine with others. What if I miss a dose? It is important not to miss your dose. Call your doctor or health care professional if you are unable to keep an appointment. What may interact with this medicine?  amphotericin B  azathioprine  certain antivirals for HIV or hepatitis  certain medicines for blood pressure, heart disease, irregular heart beat  certain medicines that treat or prevent blood clots like warfarin  certain other medicines for cancer  cyclosporine  etanercept  indomethacin  medicines that relax muscles for surgery  medicines to increase blood counts  metronidazole This list may not describe all possible interactions. Give your health care provider a list of all the medicines, herbs, non-prescription drugs, or dietary supplements you use. Also tell them if you smoke, drink alcohol, or use illegal drugs. Some items may interact with  your medicine. What should I watch for while using this medicine? Your condition will be monitored carefully while you are receiving this medicine. You may need blood work done while you are taking this medicine. Drink water or other fluids as directed. Urinate often, even at night. Some products may contain alcohol. Ask your health care professional if this medicine contains alcohol. Be sure to tell all health care professionals you are taking this medicine. Certain medicines, like metronidazole and disulfiram, can cause an unpleasant reaction when taken with alcohol. The reaction includes flushing, headache, nausea, vomiting, sweating, and increased thirst. The reaction can last from 30 minutes to several hours. Do not become pregnant while taking this medicine or for 1 year after stopping it. Women should inform their health care professional if they wish to become pregnant or think they might be pregnant. Men should not father a child while taking this medicine and for 4 months after stopping it. There  is potential for serious side effects to an unborn child. Talk to your health care professional for more information. Do not breast-feed an infant while taking this medicine or for 1 week after stopping it. This medicine has caused ovarian failure in some women. This medicine may make it more difficult to get pregnant. Talk to your health care professional if you are concerned about your fertility. This medicine has caused decreased sperm counts in some men. This may make it more difficult to father a child. Talk to your health care professional if you are concerned about your fertility. Call your health care professional for advice if you get a fever, chills, or sore throat, or other symptoms of a cold or flu. Do not treat yourself. This medicine decreases your body's ability to fight infections. Try to avoid being around people who are sick. Avoid taking medicines that contain aspirin, acetaminophen,  ibuprofen, naproxen, or ketoprofen unless instructed by your health care professional. These medicines may hide a fever. Talk to your health care professional about your risk of cancer. You may be more at risk for certain types of cancer if you take this medicine. If you are going to need surgery or other procedure, tell your health care professional that you are using this medicine. Be careful brushing or flossing your teeth or using a toothpick because you may get an infection or bleed more easily. If you have any dental work done, tell your dentist you are receiving this medicine. What side effects may I notice from receiving this medicine? Side effects that you should report to your doctor or health care professional as soon as possible:  allergic reactions like skin rash, itching or hives, swelling of the face, lips, or tongue  breathing problems  nausea, vomiting  signs and symptoms of bleeding such as bloody or black, tarry stools; red or dark brown urine; spitting up blood or brown material that looks like coffee grounds; red spots on the skin; unusual bruising or bleeding from the eyes, gums, or nose  signs and symptoms of heart failure like fast, irregular heartbeat, sudden weight gain; swelling of the ankles, feet, hands  signs and symptoms of infection like fever; chills; cough; sore throat; pain or trouble passing urine  signs and symptoms of kidney injury like trouble passing urine or change in the amount of urine  signs and symptoms of liver injury like dark yellow or brown urine; general ill feeling or flu-like symptoms; light-colored stools; loss of appetite; nausea; right upper belly pain; unusually weak or tired; yellowing of the eyes or skin Side effects that usually do not require medical attention (report to your doctor or health care professional if they continue or are bothersome):  confusion  decreased hearing  diarrhea  facial flushing  hair  loss  headache  loss of appetite  missed menstrual periods  signs and symptoms of low red blood cells or anemia such as unusually weak or tired; feeling faint or lightheaded; falls  skin discoloration This list may not describe all possible side effects. Call your doctor for medical advice about side effects. You may report side effects to FDA at 1-800-FDA-1088. Where should I keep my medicine? This drug is given in a hospital or clinic and will not be stored at home. NOTE: This sheet is a summary. It may not cover all possible information. If you have questions about this medicine, talk to your doctor, pharmacist, or health care provider.  2020 Elsevier/Gold Standard (2018-10-01 09:53:29)

## 2019-10-23 ENCOUNTER — Encounter: Payer: Self-pay | Admitting: *Deleted

## 2019-10-23 ENCOUNTER — Telehealth: Payer: Self-pay | Admitting: *Deleted

## 2019-10-23 DIAGNOSIS — Z171 Estrogen receptor negative status [ER-]: Secondary | ICD-10-CM

## 2019-10-23 DIAGNOSIS — C50412 Malignant neoplasm of upper-outer quadrant of left female breast: Secondary | ICD-10-CM

## 2019-10-23 NOTE — Telephone Encounter (Signed)
Called MS Lents[facility] regarding MRI results and recommendations. Contact information provided for return call to further discuss.

## 2019-10-24 ENCOUNTER — Telehealth: Payer: Self-pay | Admitting: *Deleted

## 2019-10-24 ENCOUNTER — Inpatient Hospital Stay: Payer: Medicare HMO

## 2019-10-24 ENCOUNTER — Other Ambulatory Visit: Payer: Self-pay

## 2019-10-24 ENCOUNTER — Other Ambulatory Visit: Payer: Self-pay | Admitting: Oncology

## 2019-10-24 VITALS — BP 144/69 | HR 62 | Temp 97.7°F | Resp 18

## 2019-10-24 DIAGNOSIS — G4730 Sleep apnea, unspecified: Secondary | ICD-10-CM | POA: Diagnosis not present

## 2019-10-24 DIAGNOSIS — C50412 Malignant neoplasm of upper-outer quadrant of left female breast: Secondary | ICD-10-CM

## 2019-10-24 DIAGNOSIS — Z171 Estrogen receptor negative status [ER-]: Secondary | ICD-10-CM

## 2019-10-24 DIAGNOSIS — E669 Obesity, unspecified: Secondary | ICD-10-CM | POA: Diagnosis not present

## 2019-10-24 DIAGNOSIS — H04123 Dry eye syndrome of bilateral lacrimal glands: Secondary | ICD-10-CM | POA: Diagnosis not present

## 2019-10-24 DIAGNOSIS — Z79899 Other long term (current) drug therapy: Secondary | ICD-10-CM | POA: Diagnosis not present

## 2019-10-24 DIAGNOSIS — Z5111 Encounter for antineoplastic chemotherapy: Secondary | ICD-10-CM | POA: Diagnosis not present

## 2019-10-24 DIAGNOSIS — H25813 Combined forms of age-related cataract, bilateral: Secondary | ICD-10-CM | POA: Diagnosis not present

## 2019-10-24 DIAGNOSIS — Z7689 Persons encountering health services in other specified circumstances: Secondary | ICD-10-CM | POA: Diagnosis not present

## 2019-10-24 DIAGNOSIS — H40023 Open angle with borderline findings, high risk, bilateral: Secondary | ICD-10-CM | POA: Diagnosis not present

## 2019-10-24 DIAGNOSIS — Z5189 Encounter for other specified aftercare: Secondary | ICD-10-CM | POA: Diagnosis not present

## 2019-10-24 MED ORDER — PEGFILGRASTIM-JMDB 6 MG/0.6ML ~~LOC~~ SOSY
PREFILLED_SYRINGE | SUBCUTANEOUS | Status: AC
  Filled 2019-10-24: qty 0.6

## 2019-10-24 MED ORDER — PEGFILGRASTIM-JMDB 6 MG/0.6ML ~~LOC~~ SOSY
6.0000 mg | PREFILLED_SYRINGE | Freq: Once | SUBCUTANEOUS | Status: AC
  Administered 2019-10-24: 6 mg via SUBCUTANEOUS

## 2019-10-24 NOTE — Telephone Encounter (Signed)
Spoke to pt concerning MRI results and need for left MR bx of NME. Received verbal understanding. No further needs voiced.

## 2019-10-28 ENCOUNTER — Telehealth: Payer: Self-pay

## 2019-10-28 NOTE — Telephone Encounter (Signed)
Pt left message for [facility] asking if it is OK to proceed with plans for Microderm Abrasion. Pt's labs are WNL and pt is afebrile; this LPN advised pt to discuss this with Dr Jana Hakim at visit 10/19. Pt verbalized thanks and understanding.

## 2019-10-28 NOTE — Progress Notes (Signed)
Cheyenne Mcdonald  [phone] [phone]     ID: Cheyenne Mcdonald DOB: Apr 23, 1950  MR#: [id number]  BLT#:[id number]  Patient Care Team: Benito Mccreedy, MD as PCP - General (Internal Medicine) Servando Salina, MD as Consulting Physician (Obstetrics and Gynecology) Mauro Kaufmann, RN as Oncology Nurse Navigator Rockwell Germany, RN as Oncology Nurse Navigator [name], MD as Consulting Physician (General Surgery) Jaikob Borgwardt, Virgie Dad, MD as Consulting Physician (Oncology) Gery Pray, MD as Consulting Physician (Radiation Oncology) Dimas Aguas, MD as Consulting Physician (Nephrology) Chauncey Cruel, MD OTHER MD:  CHIEF COMPLAINT: triple negative breast cancer  CURRENT TREATMENT: Neoadjuvant chemotherapy   INTERVAL HISTORY: Cheyenne Mcdonald returns today for follow up of her triple negative breast cancer.   Since her last visit, she underwent breast MRI on 10/21/2019 showing: breast composition B; two irregular left breast masses representing patient's known malignancy; surrounding heterogeneous non-mass enhancement primarily located superior to the masses spans 5.4 cm, suspicious for malignancy; no evidence of right breast malignancy or adenopathy. She is scheduled for biopsy of the left breast area of enhancement on 11/01/2019.  She began neoadjuvant chemotherapy, consisting of doxorubicin and cyclophosphamide in dose dense fashion x4, on 10/22/2019. Today is day 8 cycle 1.  Her most recent echocardiogram from 10/17/2019 showed an ejection fraction of 60-65%.   REVIEW OF SYSTEMS: Cheyenne Mcdonald did remarkably well with her first cycle of chemo.  She describes it as "a beautiful day".  She did forget her EMLA cream but the ice worked well.  Then there was a question whether her port was in the right place or not and we figure that out.  Denied of chemo she had very minimal nausea.  She walked to a neighbor.  She had a couple hot flashes and a very mild  headache but she slept well.  The next morning on day 2 she walked 3 miles and worked in the yard.  She got a call about her MRI which she missed and that made her anxious so she took a little Ativan that night.  On day 3 she did fine in the morning and then she got her shot.  She started her Claritin.  The next morning she did not feel so well but she had no bony aches or other major issues.  Aside from the above she had mild constipation which she resolved by eating more fiber and drinking more water and she had very minimal fatigue.  Detailed review of systems today was otherwise noncontributory   HISTORY OF CURRENT ILLNESS: From the original intake note:  Cheyenne Mcdonald presented for her routine mammography with a palpable left breast lump. She underwent bilateral diagnostic mammography with tomography and left breast ultrasonography at [facility] on 09/13/2019 showing: breast density category B; two adjacent masses in left breast at 1 o'clock, spanning approximately 3.3 cm; one indeterminate left axillary lymph node with 0.4 cm cortical bulge; no evidence right breast malignancy.  Accordingly on 09/27/2019 she proceeded to biopsy of the left breast areas in question. The pathology from this procedure ([id number]) showed: invasive ductal carcinoma, grade 3, present in both of the adjacent masses. Prognostic indicators significant for: estrogen receptor, 0% negative and progesterone receptor, 0% negative. Proliferation marker Ki67 at 90%. HER2 equivocal by immunohistochemistry (2+), but negative by fluorescent in situ hybridization with a signals ratio 2.2 and number per cell 3.3. Additional analysis of more cells showed a signals ratio 2.05 and number per cell 3.13.  The biopsied lymph  node was negative for carcinoma.  This was felt to be concordant  The patient's subsequent history is as detailed below.   PAST MEDICAL HISTORY: Past Medical History:  Diagnosis Date  . Breast cancer ([place])    . Obesity   . Sleep apnea   heart murmur (since childhood)   PAST SURGICAL HISTORY: Past Surgical History:  Procedure Laterality Date  . ABDOMINAL HYSTERECTOMY    . IR IMAGING GUIDED PORT INSERTION  10/18/2019    FAMILY HISTORY: Family History  Problem Relation Age of Onset  . Breast cancer Cousin 30       maternal cousin; bilateral     Her father died at age 91, cause of death unknown. Her mother is age 12 as of 09/2019. Cheyenne Mcdonald has two brothers (and no sisters). She reports one cousin with breast cancer at age 58.   GYNECOLOGIC HISTORY:  No LMP recorded. Patient has had a hysterectomy. Menarche: 69 years old Age at first live birth: 69 years old [place] P 2 LMP 1990 Contraceptive: used for maybe 20 years HRT: never used  Hysterectomy? Yes, 1990 BSO? 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 Husband Cheyenne Mcdonald is retired Nature conservation officer and then retired Civil Service fast streamer.  At home is just the 2 of them. Daughter Cheyenne Mcdonald, age 57, works in [place] entry in [employer]. Son Cheyenne Mcdonald, age 1, has a college degree but works for [employer] [place] [employer]. Myiesha has four grandchildren. She attends [employer].    ADVANCED DIRECTIVES: In place   HEALTH MAINTENANCE: Social History   Tobacco Use  . Smoking status: Never Smoker  . Smokeless tobacco: Never Used  Substance Use Topics  . Alcohol use: Never  . Drug use: Never     Colonoscopy: 2018 (Dr. Collene Mares)  PAP: approx. 2013  Bone density: 2016, "normal"   Allergies  Allergen Reactions  . Other Itching  . Shellfish Allergy Itching    Current Outpatient Medications  Medication Sig Dispense Refill  . Cholecalciferol 125 MCG (5000 UT) capsule Take by mouth.    . dexamethasone (DECADRON) 4 MG tablet Take 2 tablets (8 mg) by mouth twice daily days 2 and 3 after chemo, then take one last dose the morning of day 4; take with food 30 tablet 1  . diclofenac sodium (VOLTAREN) 1 % GEL  Apply 1 application topically 4 (four) times daily.      Marland Kitchen lidocaine-prilocaine (EMLA) cream Apply to affected area once 30 g 3  . loratadine (CLARITIN) 10 MG tablet Take 1 tablet (10 mg total) by mouth daily. 40 tablet 0  . LORazepam (ATIVAN) 0.5 MG tablet Take 1 tablet (0.5 mg total) by mouth at bedtime as needed (Nausea or vomiting). 20 tablet 0  . meloxicam (MOBIC) 15 MG tablet Take 15 mg by mouth daily. With food     . Naltrexone-buPROPion HCl ER (CONTRAVE) 8-90 MG TB12     . prochlorperazine (COMPAZINE) 10 MG tablet Take 1 tablet (10 mg total) by mouth every 6 (six) hours as needed (Nausea or vomiting). 30 tablet 1   No current facility-administered medications for this visit.    OBJECTIVE: African-American woman in no acute distress  There were no vitals filed for this visit.   There is no height or weight on file to calculate BMI.   Wt Readings from Last 3 Encounters:  10/18/19 185 lb 8 oz (84.1 kg)  10/09/19 183 lb 12.8 oz (83.4  kg)      ECOG FS:1 - Symptomatic but completely ambulatory  Sclerae unicteric, EOMs intact Wearing a mask No cervical or supraclavicular adenopathy Lungs no rales or rhonchi Heart regular rate and rhythm Abd soft, nontender, positive bowel sounds MSK no focal spinal tenderness, no upper extremity lymphedema Neuro: nonfocal, well oriented, appropriate affect Breasts: The mass in the left breast upper outer quadrant is still palpable, movable, firm, about 4 cm, with no skin or nipple involvement.   LAB RESULTS:  CMP     Component Value Date/Time   NA 139 10/22/2019 0955   K 3.7 10/22/2019 0955   CL 108 10/22/2019 0955   CO2 27 10/22/2019 0955   GLUCOSE 109 (H) 10/22/2019 0955   BUN 21 10/22/2019 0955   CREATININE 0.95 10/22/2019 0955   CALCIUM 9.7 10/22/2019 0955   PROT 7.1 10/22/2019 0955   ALBUMIN 3.7 10/22/2019 0955   AST 14 (L) 10/22/2019 0955   ALT 13 10/22/2019 0955   ALKPHOS 71 10/22/2019 0955   BILITOT 0.3 10/22/2019 0955    GFRNONAA >60 10/22/2019 0955   GFRAA 58 (L) 10/09/2019 0806    No results found for: TOTALPROTELP, ALBUMINELP, A1GS, A2GS, BETS, BETA2SER, GAMS, MSPIKE, SPEI  Lab Results  Component Value Date   WBC 5.3 10/29/2019   NEUTROABS 3.7 10/29/2019   HGB 12.3 10/29/2019   HCT 36.6 10/29/2019   MCV 92.4 10/29/2019   PLT 82 (L) 10/29/2019    No results found for: LABCA2  No components found for: ASTMHD622  No results for input(s): INR in the last 168 hours.  No results found for: LABCA2  No results found for: WLN989  No results found for: QJJ941  No results found for: DEY814  No results found for: CA2729  No components found for: HGQUANT  No results found for: CEA1 / No results found for: CEA1   No results found for: AFPTUMOR  No results found for: CHROMOGRNA  No results found for: KPAFRELGTCHN, LAMBDASER, KAPLAMBRATIO (kappa/lambda light chains)  No results found for: HGBA, HGBA2QUANT, HGBFQUANT, HGBSQUAN (Hemoglobinopathy evaluation)   No results found for: LDH  No results found for: IRON, TIBC, IRONPCTSAT (Iron and TIBC)  No results found for: FERRITIN  Urinalysis No results found for: COLORURINE, APPEARANCEUR, LABSPEC, PHURINE, GLUCOSEU, HGBUR, BILIRUBINUR, KETONESUR, PROTEINUR, UROBILINOGEN, NITRITE, LEUKOCYTESUR   STUDIES: DG Chest 1 View  Result Date: 10/22/2019 CLINICAL DATA:  Port placement EXAM: CHEST  1 VIEW COMPARISON:  2011 FINDINGS: Right chest wall port catheter tip overlies the SVC. No pneumothorax. Lungs are clear. No pleural effusion. Cardiomediastinal contours are within normal limits. IMPRESSION: Right chest wall port catheter tip overlies SVC.  No pneumothorax. Electronically Signed   By: Macy Mis M.D.   On: 10/22/2019 10:37   MR BREAST BILATERAL W WO CONTRAST INC CAD  Result Date: 10/22/2019 CLINICAL DATA:  Recently diagnosed left breast invasive ductal carcinoma, here for evaluation of extent of disease. Two site left breast biopsy  of masses at 1 o'clock 6 cm from the nipple marked with a ribbon clip and at 1:30 o'clock 6 cm from the nipple marked with a coil clip demonstrated invasive ductal carcinoma at both sites. Biopsy of a left axillary lymph node did not demonstrate any evidence of metastatic disease. LABS:  eGFR 50 mL/min on 10/22/2019 EXAM: BILATERAL BREAST MRI WITH AND WITHOUT CONTRAST TECHNIQUE: Multiplanar, multisequence MR images of both breasts were obtained prior to and following the intravenous administration of 8 ml of Gadavist Three-dimensional MR images  were rendered by post-processing of the original MR data on an independent workstation. The three-dimensional MR images were interpreted, and findings are reported in the following complete MRI report for this study. Three dimensional images were evaluated at the independent interpreting workstation using the DynaCAD thin client. COMPARISON:  Previous exam(s). FINDINGS: Breast composition: b. Scattered fibroglandular tissue. Background parenchymal enhancement: Mild Right breast: No mass or abnormal enhancement. A central venous port catheter is located in the upper right chest and partially imaged. Left breast: An irregular enhancing mass in the upper outer left breast at middle depth measures 1.8 x 1.5 (series 7, image 51) x 2.2 cm (sagittal reconstruction) and is consistent with the patient's biopsy proven malignancy. Magnetic susceptibility within the mass corresponds to the ribbon shaped marking clip. An irregular enhancing mass in the upper outer left breast at middle to anterior depth is located lateral and inferior to this mass, measuring 0.9 x 0.8 (series 7, image 55) x 1.0 cm (sagittal reconstruction), and is consistent with the patient's biopsy proven malignancy. Magnetic susceptibility within the mass corresponds to the coil shaped marking clip. Together, these two masses representing the patient's known malignancy span 3.3 cm in greatest dimension on an oblique  reformat plane. There is surrounding heterogeneous non mass enhancement primarily located superior to these masses which in total spans approximately 2.4 cm TV (series 7, image 45) x 4.5 cm CC x 5.4 cm AP (sagittal reconstruction). This is suspicious for malignancy. Lymph nodes: No abnormal appearing lymph nodes, however both axilla are incompletely imaged on this exam. Ancillary findings:  None. IMPRESSION: 1. Two irregular masses in the upper outer left breast representing the patient's known malignancy. Surrounding heterogeneous non mass enhancement primarily located superior to these masses spans 5.4 cm in greatest dimension and is suspicious for malignancy. 2.  No evidence of malignancy in the right breast. RECOMMENDATION: Recommend continued surgical/oncologic management. If the patient is a candidate for breast conserving surgery, MR guided biopsy could be performed of the non mass enhancement located superior to the two sites of biopsy proven malignancy to confirm extent of disease. BI-RADS CATEGORY  4: Suspicious. Electronically Signed   By: Zerita Boers M.D.   On: 10/22/2019 16:34   ECHOCARDIOGRAM COMPLETE  Result Date: 10/17/2019    ECHOCARDIOGRAM REPORT   Patient Name:   DAPHINE LOCH Loisel Date of Exam: 10/17/2019 Medical Rec #:  [id number]      Height:       63.5 in Accession #:    [id number]     Weight:       183.8 lb Date of Birth:  Jul 08, 1950       BSA:          1.876 m Patient Age:    19 years       BP:           134/79 mmHg Patient Gender: F              HR:           56 bpm. Exam Location:  Outpatient Procedure: 2D Echo, Cardiac Doppler, Color Doppler, Strain Analysis and 3D Echo Indications:    Z51.11 Encounter for antineoplastic chemotheraphy  History:        Patient has no prior history of Echocardiogram examinations.                 Risk Factors:Sleep Apnea.  Sonographer:    Jonelle Sidle Dance Referring Phys: [place]  1. Left ventricular  ejection fraction, by estimation, is  60 to 65%. The left ventricle has normal function. The left ventricle has no regional wall motion abnormalities. Left ventricular diastolic parameters are consistent with Grade I diastolic dysfunction (impaired relaxation).  2. Right ventricular systolic function is normal. The right ventricular size is normal.  3. The mitral valve is normal in structure. No evidence of mitral valve regurgitation. No evidence of mitral stenosis.  4. The aortic valve is tricuspid. There is mild thickening of the aortic valve. Aortic valve regurgitation is not visualized.  5. The inferior vena cava is normal in size with greater than 50% respiratory variability, suggesting right atrial pressure of 3 mmHg. Comparison(s): No prior Echocardiogram. FINDINGS  Left Ventricle: Left ventricular ejection fraction, by estimation, is 60 to 65%. The left ventricle has normal function. The left ventricle has no regional wall motion abnormalities. The left ventricular internal cavity size was normal in size. There is  no left ventricular hypertrophy. Left ventricular diastolic parameters are consistent with Grade I diastolic dysfunction (impaired relaxation). Right Ventricle: The right ventricular size is normal. No increase in right ventricular wall thickness. Right ventricular systolic function is normal. Left Atrium: Left atrial size was normal in size. Right Atrium: Right atrial size was normal in size. Pericardium: There is no evidence of pericardial effusion. Mitral Valve: The mitral valve is normal in structure. There is mild thickening of the mitral valve leaflet(s). Mild mitral annular calcification. No evidence of mitral valve regurgitation. No evidence of mitral valve stenosis. Tricuspid Valve: The tricuspid valve is normal in structure. Tricuspid valve regurgitation is trivial. Aortic Valve: The aortic valve is tricuspid. There is mild thickening of the aortic valve. Aortic valve regurgitation is not visualized. Pulmonic Valve: The  pulmonic valve was normal in structure. Pulmonic valve regurgitation is trivial. Aorta: The aortic root and ascending aorta are structurally normal, with no evidence of dilitation. Venous: The inferior vena cava is normal in size with greater than 50% respiratory variability, suggesting right atrial pressure of 3 mmHg. IAS/Shunts: No atrial level shunt detected by color flow Doppler.  LEFT VENTRICLE PLAX 2D LVIDd:         4.70 cm  Diastology LVIDs:         3.00 cm  LV e' medial:    6.20 cm/s LV PW:         0.90 cm  LV E/e' medial:  14.3 LV IVS:        1.10 cm  LV e' lateral:   7.94 cm/s LVOT diam:     2.00 cm  LV E/e' lateral: 11.2 LV SV:         79 LV SV Index:   42 LVOT Area:     3.14 cm                          3D Volume EF:                         3D EF:        66 %                         LV EDV:       119 ml                         LV ESV:       41 ml  LV SV:        78 ml RIGHT VENTRICLE             IVC RV Basal diam:  2.50 cm     IVC diam: 1.40 cm RV S prime:     12.20 cm/s TAPSE (M-mode): 2.0 cm LEFT ATRIUM             Index       RIGHT ATRIUM           Index LA diam:        4.30 cm 2.29 cm/m  RA Area:     11.50 cm LA Vol (A2C):   52.4 ml 27.93 ml/m RA Volume:   21.80 ml  11.62 ml/m LA Vol (A4C):   35.3 ml 18.81 ml/m LA Biplane Vol: 44.4 ml 23.66 ml/m  AORTIC VALVE LVOT Vmax:   106.00 cm/s LVOT Vmean:  69.200 cm/s LVOT VTI:    0.252 m  AORTA Ao Root diam: 3.20 cm Ao Asc diam:  3.30 cm MITRAL VALVE MV Area (PHT): 2.73 cm     SHUNTS MV Decel Time: 278 msec     Systemic VTI:  0.25 m MV E velocity: 88.60 cm/s   Systemic Diam: 2.00 cm MV A velocity: 103.00 cm/s MV E/A ratio:  0.86 Gwyndolyn Kaufman MD Electronically signed by Gwyndolyn Kaufman MD Signature Date/Time: 10/17/2019/10:20:32 AM    Final    IR IMAGING GUIDED PORT INSERTION  Result Date: 10/18/2019 INDICATION: 69 year old female with history of left breast cancer requiring central venous access for chemotherapy. EXAM:  IMPLANTED PORT A CATH PLACEMENT WITH ULTRASOUND AND FLUOROSCOPIC GUIDANCE COMPARISON:  None. MEDICATIONS: Ancef 2 gm IV; The antibiotic was administered within an appropriate time interval prior to skin puncture. ANESTHESIA/SEDATION: Moderate (conscious) sedation was employed during this procedure. A total of Versed 2 mg and Fentanyl 100 mcg was administered intravenously. Moderate Sedation Time: 22 minutes. The patient's level of consciousness and vital signs were monitored continuously by radiology nursing throughout the procedure under my direct supervision. CONTRAST:  None FLUOROSCOPY TIME:  0 minutes, 6 seconds (2 mGy) COMPLICATIONS: None immediate. PROCEDURE: The procedure, risks, benefits, and alternatives were explained to the patient. Questions regarding the procedure were encouraged and answered. The patient understands and consents to the procedure. The right neck and chest were prepped with chlorhexidine in a sterile fashion, and a sterile drape was applied covering the operative field. Maximum barrier sterile technique with sterile gowns and gloves were used for the procedure. A timeout was performed prior to the initiation of the procedure. Ultrasound was used to examine the jugular vein which was compressible and free of internal echoes. A skin marker was used to demarcate the planned venotomy and port pocket incision sites. Local anesthesia was provided to these sites and the subcutaneous tunnel track with 1% lidocaine with 1:100,000 epinephrine. A small incision was created at the jugular access site and blunt dissection was performed of the subcutaneous tissues. Under real time ultrasound guidance, the jugular vein was accessed with a 21 ga micropuncture needle and an 0.018" wire was inserted to the superior vena cava. A 5 Fr micopuncture set was then used, through which a 0.035" Rosen wire was passed under fluoroscopic guidance into the inferior vena cava. An 8 Fr dilator was then placed over the  wire. A subcutaneous port pocket was then created along the upper chest wall utilizing a combination of sharp and blunt dissection. The pocket was irrigated with sterile saline, packed with gauze,  and observed for hemorrhage. A single lumen "ISP" sized power injectable port was chosen for placement. The 8 Fr catheter was tunneled from the port pocket site to the venotomy incision. The port was placed in the pocket. The external catheter was trimmed to appropriate length. The dilator was exchanged for an 8 Fr peel-away sheath under fluoroscopic guidance. The catheter was then placed through the sheath and the sheath was removed. Final catheter positioning was confirmed and documented with a fluoroscopic spot radiograph. The port was accessed with a Huber needle, aspirated, and flushed with heparinized saline. The deep dermal layer of the port pocket incision was closed with interrupted 3-0 Vicryl suture. The skin was opposed with a running subcuticular 4-0 Monocryl suture. Dermabond was then placed over the port pocket and neck incisions. The patient tolerated the procedure well without immediate post procedural complication. FINDINGS: After catheter placement, the tip lies within the cavoatrial junction the catheter aspirates and flushes normally and is ready for immediate use. IMPRESSION: Successful placement of a power injectable Port-A-Cath via the right internal jugular vein. The catheter is ready for immediate use. Ruthann Cancer, MD [facility] Radiology Electronically Signed   By: Ruthann Cancer MD   On: 10/18/2019 12:31     ELIGIBLE FOR AVAILABLE RESEARCH PROTOCOL: AET  ASSESSMENT: 69 y.o. [place] woman status post left breast upper outer quadrant biopsy 09/27/2019 for a clinically T1-T2 N0, stage IA- IIA invasive ductal carcinoma, grade 3, triple negative, with an MIB-1 of 90%.  (1) genetics testing results pending  (2) neoadjuvant chemotherapy will  consist of doxorubicin and cyclophosphamide in dose dense fashion x4 starting 10/22/2019 to be followed by weekly carboplatin and paclitaxel x12  (a) echo 10/17/2019 shows an ejection fraction in the 60-65% range  (3) definitive surgery to follow  (4) adjuvant radiation as appropriate   PLAN: Cheyenne Mcdonald did remarkably well with her first cycle of chemotherapy and her nadir counts are excellent.  She will not need intercurrent antibiotics or really visits in between her treatments.  I suggest that if she does get a headache which is likely due to the palonosetron she take Tylenol.  For the constipation she is going to start her fiber and water pretty much on the day of treatment and if that does not work well she can take stool softeners or laxatives.  She is already scheduled for biopsy this Friday.  We should have those results by the time she returns to see [place] a week from today for cycle #2.  She understands that until she finishes the chemo and we repeat the MRI of the breast 1 last time we will not really know her surgical plan  She also notes she will lose her hair in the next 10 days.  She tells me she already has 3 weeks "locked and ready".  Total encounter time 30 minutes.Sarajane Jews C. Magrinat, MD 10/29/2019 10:43 AM Medical Oncology and Hematology [facility] [place], [place] [address] Tel. [phone]    Fax. [phone]   This document serves as a record of services personally performed by Lurline Del, MD. It was created on his behalf by Wilburn Mylar, a trained medical scribe. The creation of this record is based on the scribe's personal observations and the provider's statements to them.   I, Lurline Del MD, have reviewed the above documentation for accuracy and completeness, and I agree with the above.   *Total Encounter Time as  defined by the Centers for Medicare and Medicaid Services includes, in addition to the face-to-face time  of a patient visit (documented in the note above) non-face-to-face time: obtaining and reviewing outside history, ordering and reviewing medications, tests or procedures, care coordination (communications with other health care professionals or caregivers) and documentation in the medical record.

## 2019-10-29 ENCOUNTER — Inpatient Hospital Stay: Payer: Medicare HMO | Admitting: Oncology

## 2019-10-29 ENCOUNTER — Inpatient Hospital Stay: Payer: Medicare HMO

## 2019-10-29 ENCOUNTER — Encounter: Payer: Self-pay | Admitting: *Deleted

## 2019-10-29 ENCOUNTER — Other Ambulatory Visit: Payer: Self-pay

## 2019-10-29 VITALS — BP 138/71 | HR 67 | Temp 97.2°F | Resp 17 | Ht 63.5 in | Wt 186.1 lb

## 2019-10-29 DIAGNOSIS — Z171 Estrogen receptor negative status [ER-]: Secondary | ICD-10-CM | POA: Diagnosis not present

## 2019-10-29 DIAGNOSIS — C50412 Malignant neoplasm of upper-outer quadrant of left female breast: Secondary | ICD-10-CM

## 2019-10-29 DIAGNOSIS — Z7689 Persons encountering health services in other specified circumstances: Secondary | ICD-10-CM | POA: Diagnosis not present

## 2019-10-29 DIAGNOSIS — Z23 Encounter for immunization: Secondary | ICD-10-CM

## 2019-10-29 DIAGNOSIS — Z5111 Encounter for antineoplastic chemotherapy: Secondary | ICD-10-CM | POA: Diagnosis not present

## 2019-10-29 DIAGNOSIS — G4730 Sleep apnea, unspecified: Secondary | ICD-10-CM | POA: Diagnosis not present

## 2019-10-29 DIAGNOSIS — Z5189 Encounter for other specified aftercare: Secondary | ICD-10-CM | POA: Diagnosis not present

## 2019-10-29 DIAGNOSIS — Z79899 Other long term (current) drug therapy: Secondary | ICD-10-CM | POA: Diagnosis not present

## 2019-10-29 DIAGNOSIS — E669 Obesity, unspecified: Secondary | ICD-10-CM | POA: Diagnosis not present

## 2019-10-29 DIAGNOSIS — Z95828 Presence of other vascular implants and grafts: Secondary | ICD-10-CM

## 2019-10-29 LAB — CBC WITH DIFFERENTIAL (CANCER CENTER ONLY)
Abs Immature Granulocytes: 0.05 10*3/uL (ref 0.00–0.07)
Basophils Absolute: 0.1 10*3/uL (ref 0.0–0.1)
Basophils Relative: 2 %
Eosinophils Absolute: 0.2 10*3/uL (ref 0.0–0.5)
Eosinophils Relative: 4 %
HCT: 36.6 % (ref 36.0–46.0)
Hemoglobin: 12.3 g/dL (ref 12.0–15.0)
Immature Granulocytes: 1 %
Lymphocytes Relative: 16 %
Lymphs Abs: 0.8 10*3/uL (ref 0.7–4.0)
MCH: 31.1 pg (ref 26.0–34.0)
MCHC: 33.6 g/dL (ref 30.0–36.0)
MCV: 92.4 fL (ref 80.0–100.0)
Monocytes Absolute: 0.5 10*3/uL (ref 0.1–1.0)
Monocytes Relative: 9 %
Neutro Abs: 3.7 10*3/uL (ref 1.7–7.7)
Neutrophils Relative %: 68 %
Platelet Count: 82 10*3/uL — ABNORMAL LOW (ref 150–400)
RBC: 3.96 MIL/uL (ref 3.87–5.11)
RDW: 11.9 % (ref 11.5–15.5)
WBC Count: 5.3 10*3/uL (ref 4.0–10.5)
nRBC: 0 % (ref 0.0–0.2)

## 2019-10-29 LAB — CMP (CANCER CENTER ONLY)
ALT: 13 U/L (ref 0–44)
AST: 12 U/L — ABNORMAL LOW (ref 15–41)
Albumin: 3.4 g/dL — ABNORMAL LOW (ref 3.5–5.0)
Alkaline Phosphatase: 95 U/L (ref 38–126)
Anion gap: 4 — ABNORMAL LOW (ref 5–15)
BUN: 24 mg/dL — ABNORMAL HIGH (ref 8–23)
CO2: 28 mmol/L (ref 22–32)
Calcium: 9.7 mg/dL (ref 8.9–10.3)
Chloride: 105 mmol/L (ref 98–111)
Creatinine: 0.84 mg/dL (ref 0.44–1.00)
GFR, Estimated: >60 mL/min (ref >60)
Glucose, Bld: 83 mg/dL (ref 70–99)
Potassium: 4.2 mmol/L (ref 3.5–5.1)
Sodium: 137 mmol/L (ref 135–145)
Total Bilirubin: <0.2 mg/dL — ABNORMAL LOW (ref 0.3–1.2)
Total Protein: 6.4 g/dL — ABNORMAL LOW (ref 6.5–8.1)

## 2019-10-29 MED ORDER — SODIUM CHLORIDE 0.9% FLUSH
10.0000 mL | Freq: Once | INTRAVENOUS | Status: AC
  Administered 2019-10-29: 10 mL
  Filled 2019-10-29: qty 10

## 2019-10-29 MED ORDER — HEPARIN SOD (PORK) LOCK FLUSH 100 UNIT/ML IV SOLN
500.0000 [IU] | Freq: Once | INTRAVENOUS | Status: AC
  Administered 2019-10-29: 500 [IU]
  Filled 2019-10-29: qty 5

## 2019-10-29 NOTE — Progress Notes (Signed)
   Covid-19 Vaccination Clinic  Name:  RIE MCNEIL    MRN: [id number] DOB: [birth year]  10/29/2019  Ms. Emrich was observed post Covid-19 immunization for 15 minutes without incident. She was provided with Vaccine Information Sheet and instruction to access the V-Safe system.   Ms. Beauchamp was instructed to call 911 with any severe reactions post vaccine: Marland Kitchen Difficulty breathing  . Swelling of face and throat  . A fast heartbeat  . A bad rash all over body  . Dizziness and weakness

## 2019-10-29 NOTE — Patient Instructions (Signed)

## 2019-11-01 ENCOUNTER — Telehealth: Payer: Self-pay | Admitting: Genetic Counselor

## 2019-11-01 ENCOUNTER — Ambulatory Visit
Admission: RE | Admit: 2019-11-01 | Discharge: 2019-11-01 | Disposition: A | Discharge status: Home / Self Care | Payer: Medicare HMO | Source: Ambulatory Visit | Attending: Oncology | Admitting: Oncology

## 2019-11-01 ENCOUNTER — Other Ambulatory Visit: Payer: Self-pay

## 2019-11-01 DIAGNOSIS — C50412 Malignant neoplasm of upper-outer quadrant of left female breast: Secondary | ICD-10-CM

## 2019-11-01 DIAGNOSIS — R928 Other abnormal and inconclusive findings on diagnostic imaging of breast: Secondary | ICD-10-CM | POA: Diagnosis not present

## 2019-11-01 DIAGNOSIS — Z171 Estrogen receptor negative status [ER-]: Secondary | ICD-10-CM

## 2019-11-01 DIAGNOSIS — N649 Disorder of breast, unspecified: Secondary | ICD-10-CM | POA: Diagnosis not present

## 2019-11-01 IMAGING — MR MR BREAST BX W LOC DEV 1ST LESION IMAGE BX SPEC MR GUIDE*L*
6 of 8 series · 32 of 48 positions shown · IV contrast (gadavist)
Comparison: Previous exams.
COMPARISON: Previous exams.

Addendum:
CLINICAL DATA: 69-year-old female with 2 areas of newly diagnosed
breast cancer in the UPPER-OUTER LEFT breast. For tissue sampling of
anterosuperior extension of non masslike enhancement within the
UPPER OUTER LEFT breast.

EXAM:
MRI GUIDED CORE NEEDLE BIOPSY OF THE LEFT BREAST
TECHNIQUE: Multiplanar, multisequence MR imaging of the LEFT breast was
performed both before and after administration of intravenous
contrast.
CONTRAST:  8mL GADAVIST GADOBUTROL 1 MMOL/ML IV SOLN

[Series 2: fiducial unilateral · sagittal · 2.0mm · 1.33mm/px · 1 of 52 slices shown]
[im 1/52]
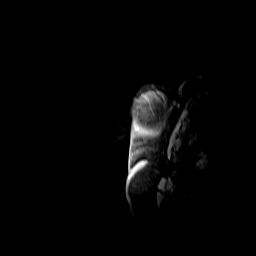

[Series 3: dynamic pre · axial · non-contrast · 1.3mm · 0.73mm/px · z∈[-67,+119]mm · 6 of 144 slices shown]
[im 1/144]
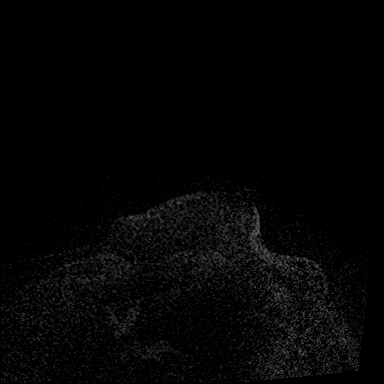
[im 29/144]
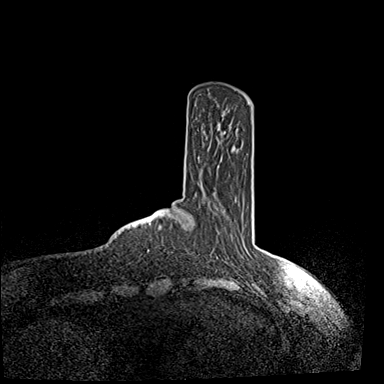
[im 58/144]
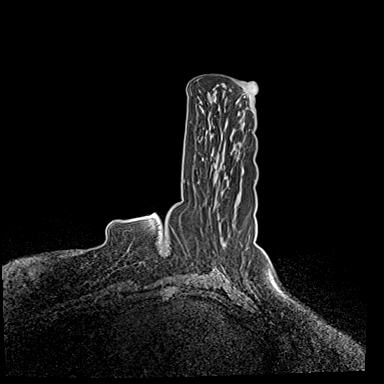
[im 86/144]
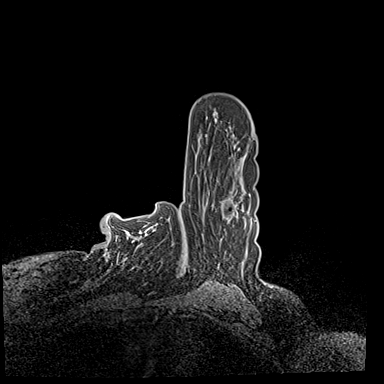
[im 115/144]
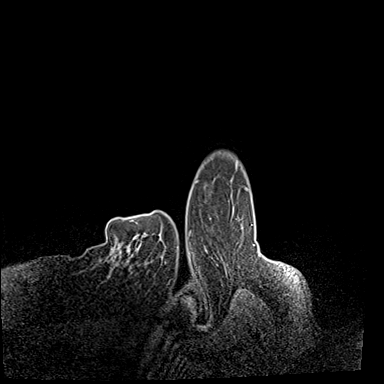
[im 144/144]
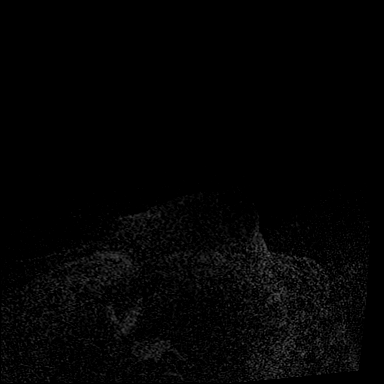

[Series 4: dynamic post 20 · axial · 1.3mm · 0.73mm/px · z∈[-67,+119]mm · 6 of 144 slices shown (1 of 2)]
[im 1/144]
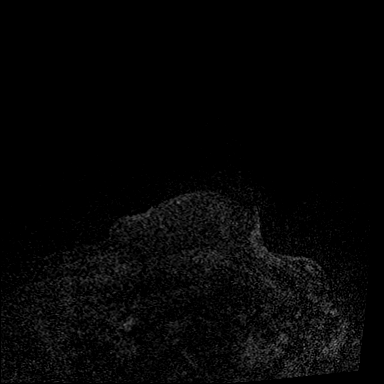
[im 29/144]
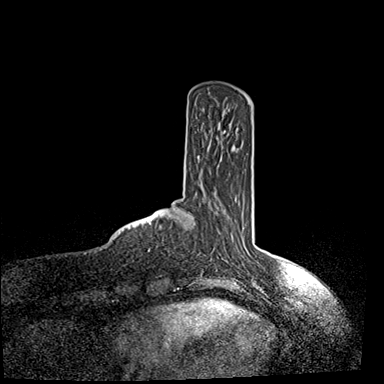
[im 58/144]
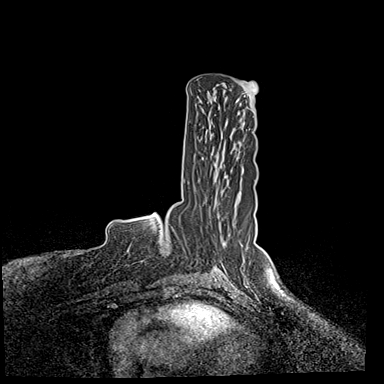
[im 86/144]
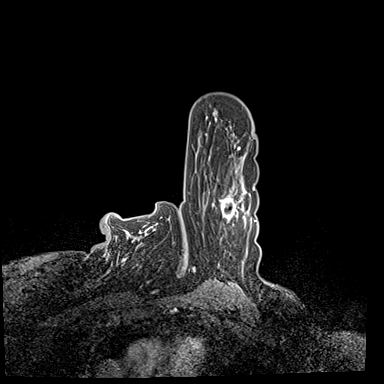
[im 115/144]
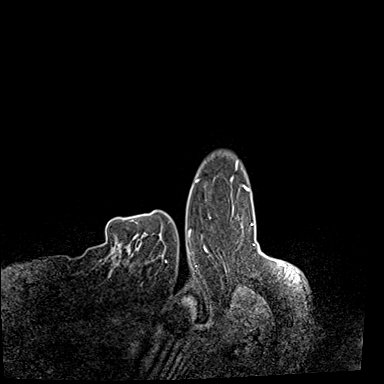
[im 144/144]
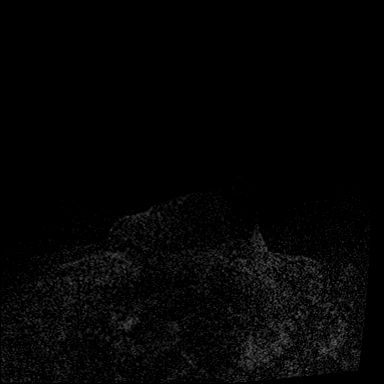

[Series 5: dynamic post 20 · axial · 1.3mm · 0.73mm/px · z∈[-67,+119]mm · 7 of 144 slices shown (2 of 2)]
[im 1/144]
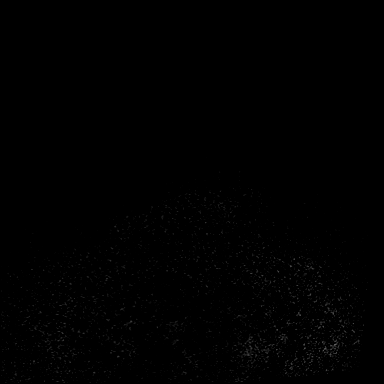
[im 24/144]
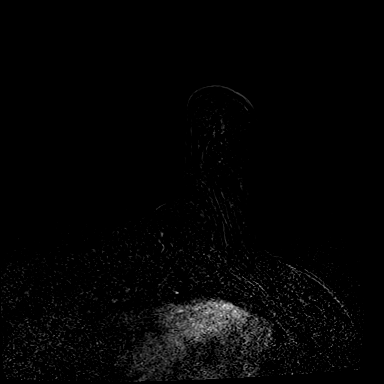
[im 48/144]
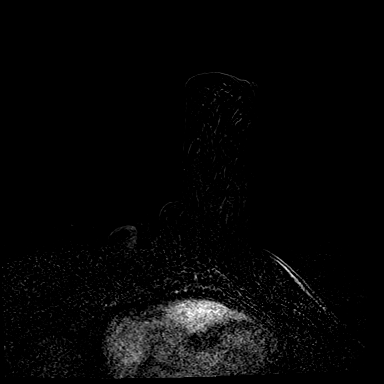
[im 72/144]
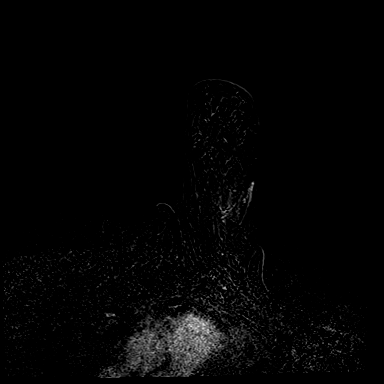
[im 96/144]
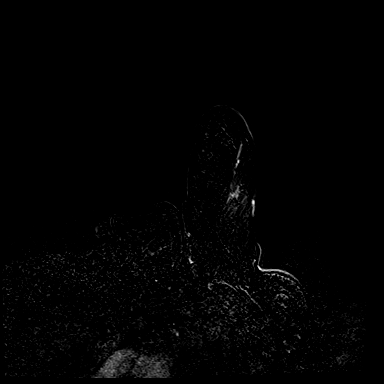
[im 120/144]
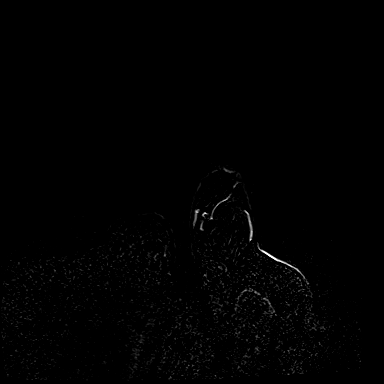
[im 144/144]
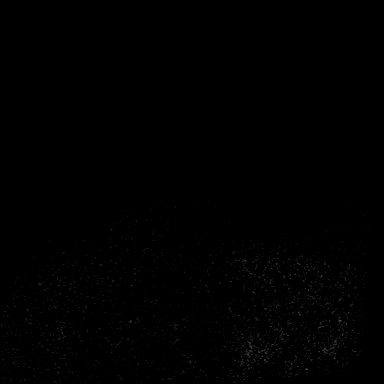

[Series 6: needle confirmation · axial · 1.3mm · 0.73mm/px · z∈[-67,+119]mm · 7 of 144 slices shown]
[im 1/144]
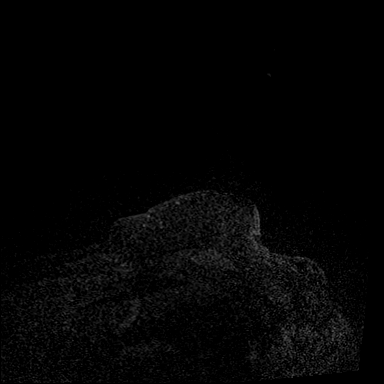
[im 24/144]
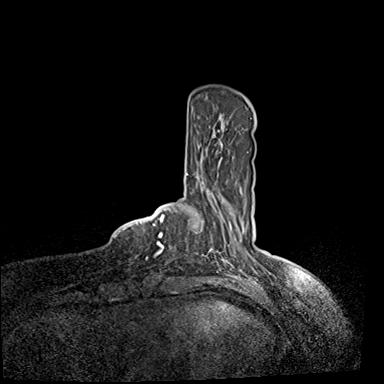
[im 48/144]
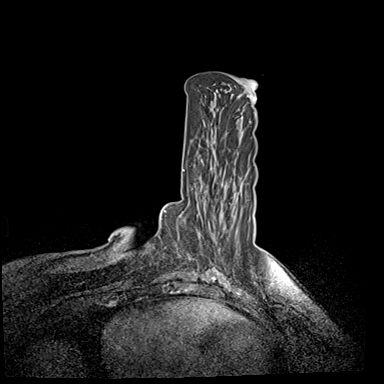
[im 72/144]
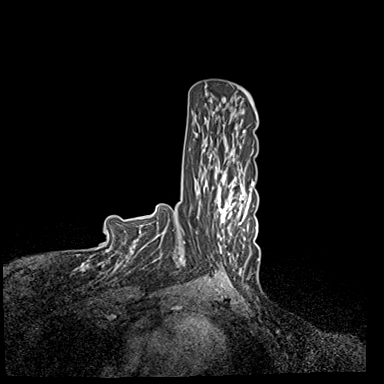
[im 96/144]
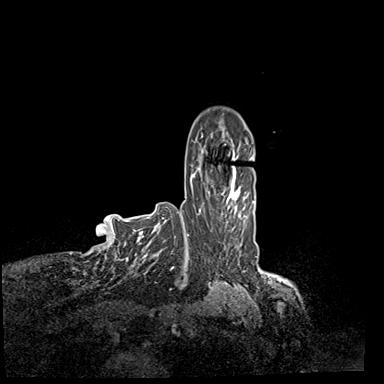
[im 120/144]
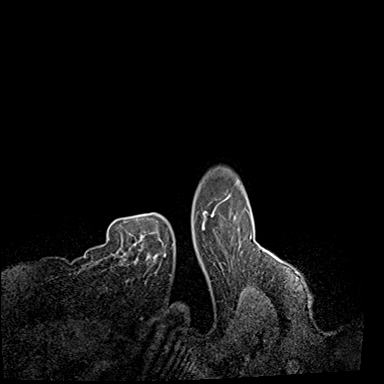
[im 144/144]
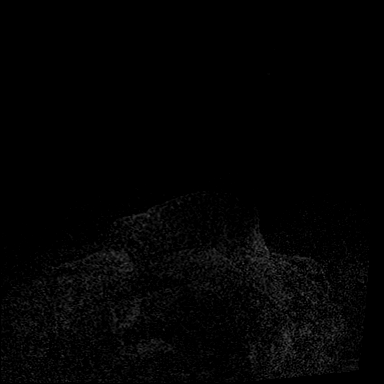

[Series 7: needle confirmation_sub · axial · 1.3mm · 0.73mm/px · z∈[-67,+56]mm · 5 of 144 slices shown]
[im 1/144]
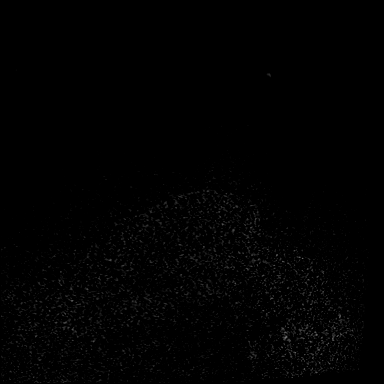
[im 24/144]
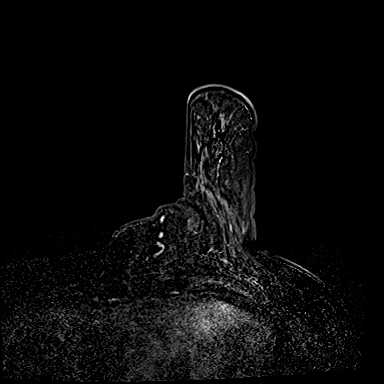
[im 48/144]
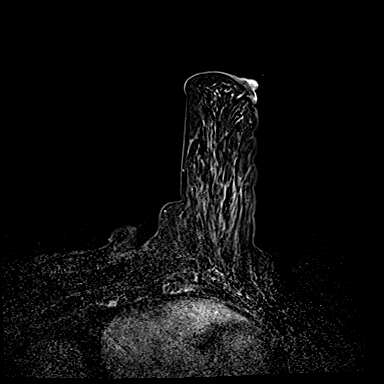
[im 72/144]
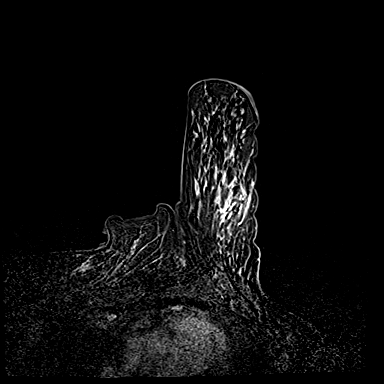
[im 96/144]
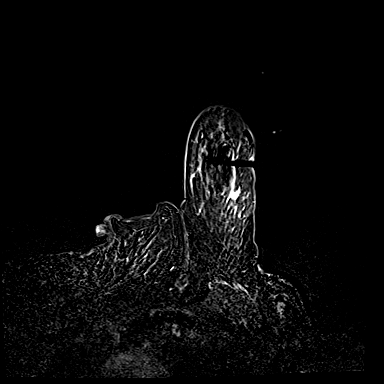

[32 of 48 positions shown; findings below may reference images not displayed]

FINDINGS: I met with the patient, and we discussed the procedure of MRI guided
biopsy, including risks, benefits, and alternatives. Specifically,
we discussed the risks of infection, bleeding, tissue injury, clip
migration, and inadequate sampling. Informed, written consent was
given. The usual time out protocol was performed immediately prior
to the procedure.

Using sterile technique, 1% Lidocaine, MRI guidance, and a 9 gauge
vacuum assisted device, biopsy was performed of the anterosuperior
aspect of the non masslike enhancement within the UPPER-OUTER LEFT
breast using a LATERAL approach. At the conclusion of the procedure,
a CYLINDER tissue marker clip was deployed into the biopsy cavity.
Follow-up 2-view mammogram was performed and dictated separately.
IMPRESSION: MRI guided biopsy of anterosuperior aspect of non masslike
enhancement within the UPPER-OUTER LEFT breast. No apparent
complications.

ADDENDUM:
Pathology revealed BENIGN BREAST TISSUE of the LEFT breast. This was
found to be DISCORDANT by Dr. LOZOYA with possible sampling error
as the non masslike enhancement identified on MR is suspicious.

The suspicious MR enhancement extending anteriorly and superiorly
from the biopsy-proven malignancies should be considered malignant
until proven otherwise. Recommend repeat MR biopsy if breast
conservation is to be pursued.

Pathology results were discussed with the patient by telephone. The
patient reported doing well after the biopsy with tenderness at the
site. Post biopsy instructions and care were reviewed and questions
were answered. The patient was encouraged to call The [REDACTED]

The patient has a recent diagnosis of left breast cancer and should
follow her outlined treatment plan.

Pathology results reported by LOZOYA RN on [DATE].

*** End of Addendum ***
FINDINGS: I met with the patient, and we discussed the procedure of MRI guided
biopsy, including risks, benefits, and alternatives. Specifically,
we discussed the risks of infection, bleeding, tissue injury, clip
migration, and inadequate sampling. Informed, written consent was
given. The usual time out protocol was performed immediately prior
to the procedure.

Using sterile technique, 1% Lidocaine, MRI guidance, and a 9 gauge
vacuum assisted device, biopsy was performed of the anterosuperior
aspect of the non masslike enhancement within the UPPER-OUTER LEFT
breast using a LATERAL approach. At the conclusion of the procedure,
a CYLINDER tissue marker clip was deployed into the biopsy cavity.
Follow-up 2-view mammogram was performed and dictated separately.
IMPRESSION: MRI guided biopsy of anterosuperior aspect of non masslike
enhancement within the UPPER-OUTER LEFT breast. No apparent
complications.

## 2019-11-01 IMAGING — MG MM BREAST LOCALIZATION CLIP
4 series · 4 of 12 positions shown · non-contrast
Comparison: Previous exam(s).

CLINICAL DATA: Evaluate CYLINDER biopsy clip placement following MR
guided LEFT breast biopsy.

EXAM:
DIAGNOSTIC LEFT MAMMOGRAM POST ULTRASOUND BIOPSY

[L CC synth-2D]
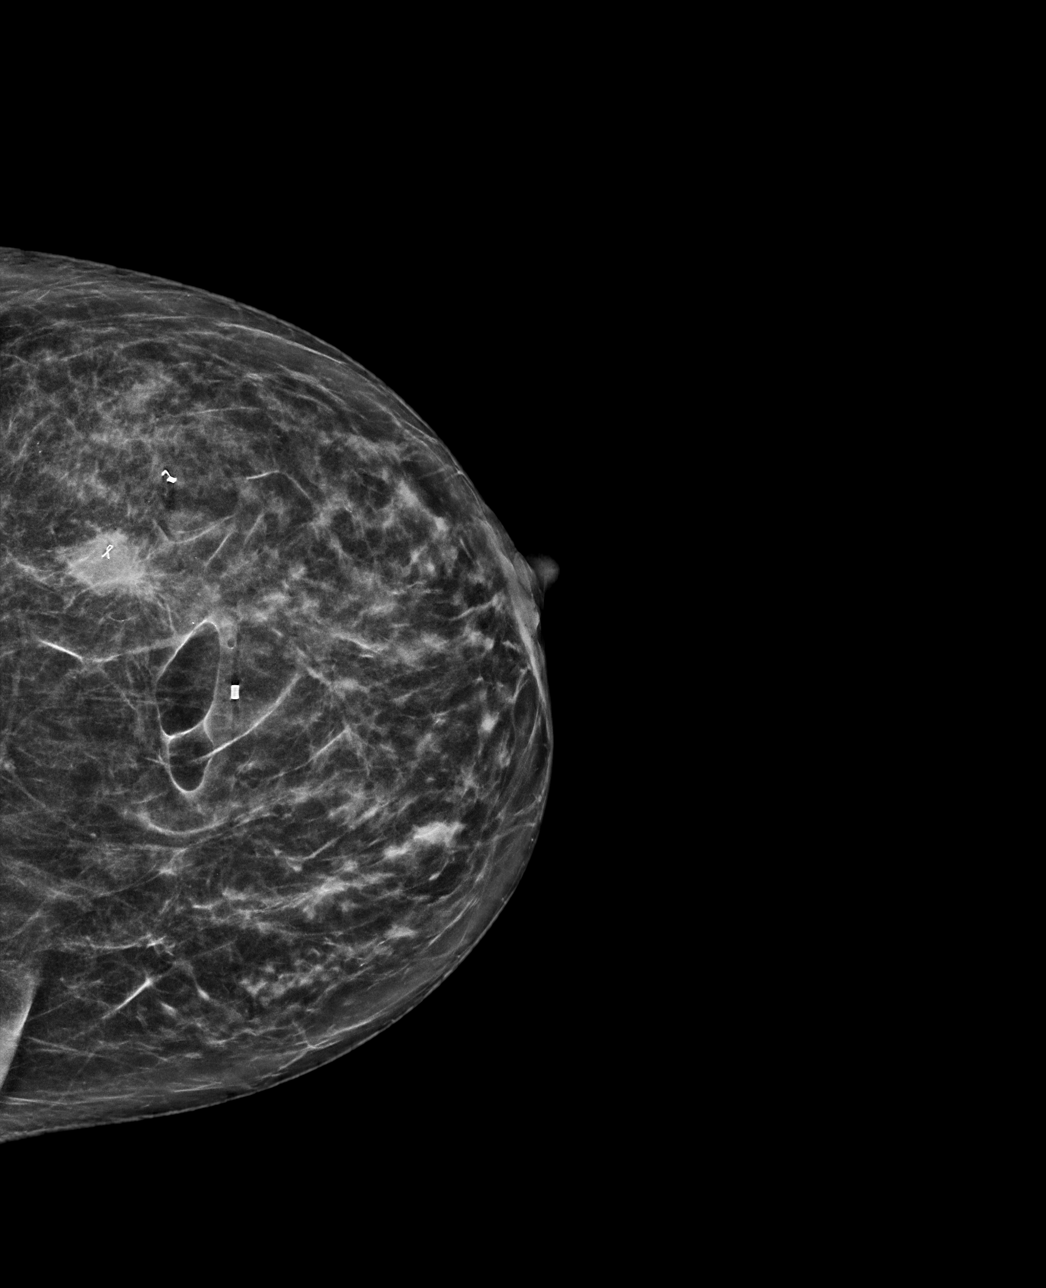

[L ML synth-2D]
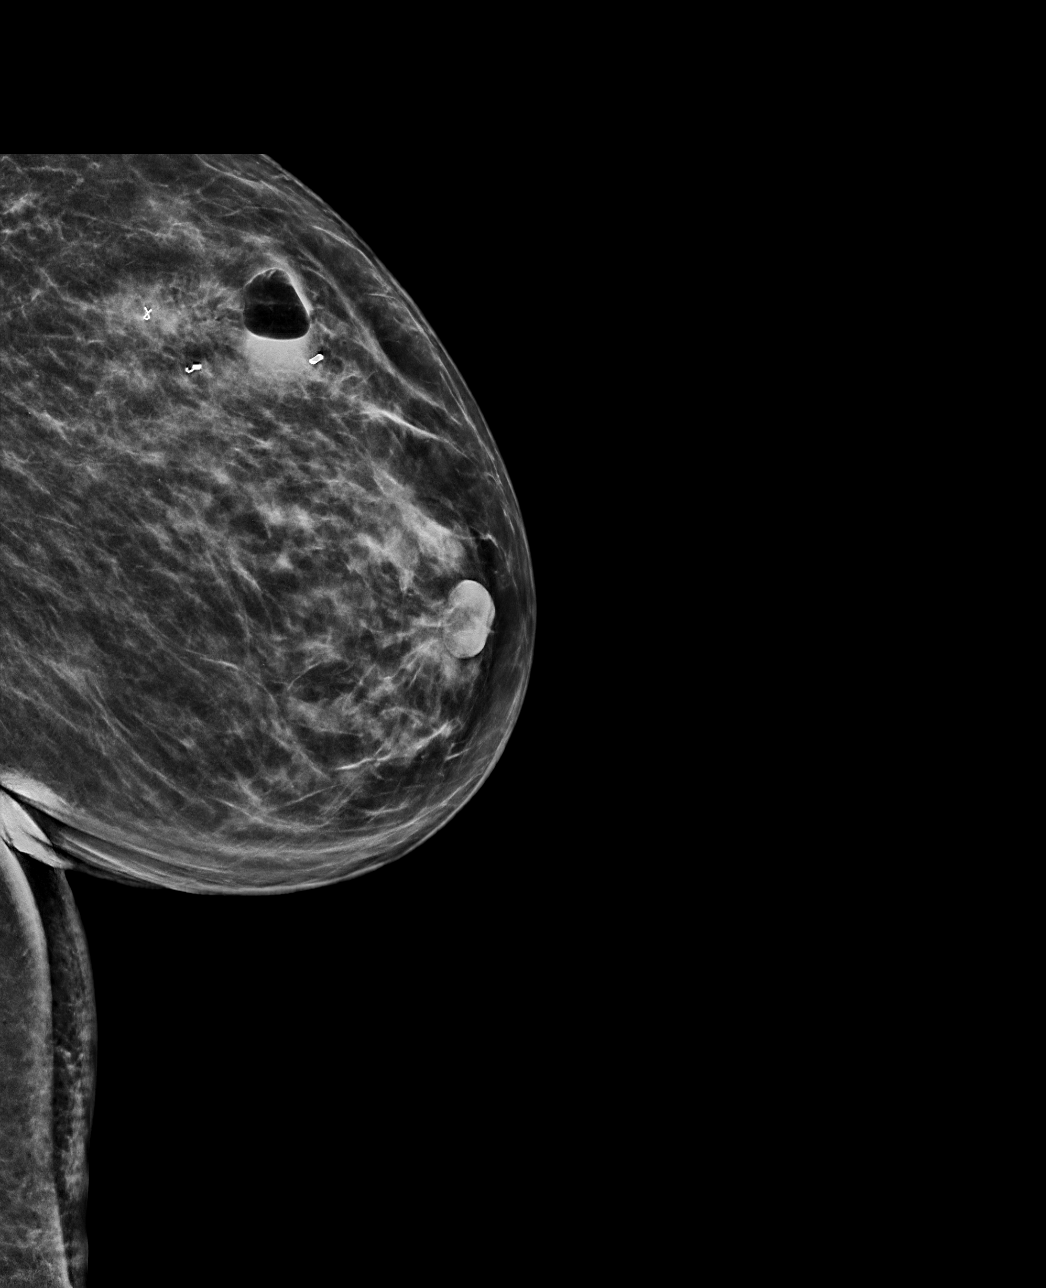

[L CC tomo · tomo slice 31/61.0]
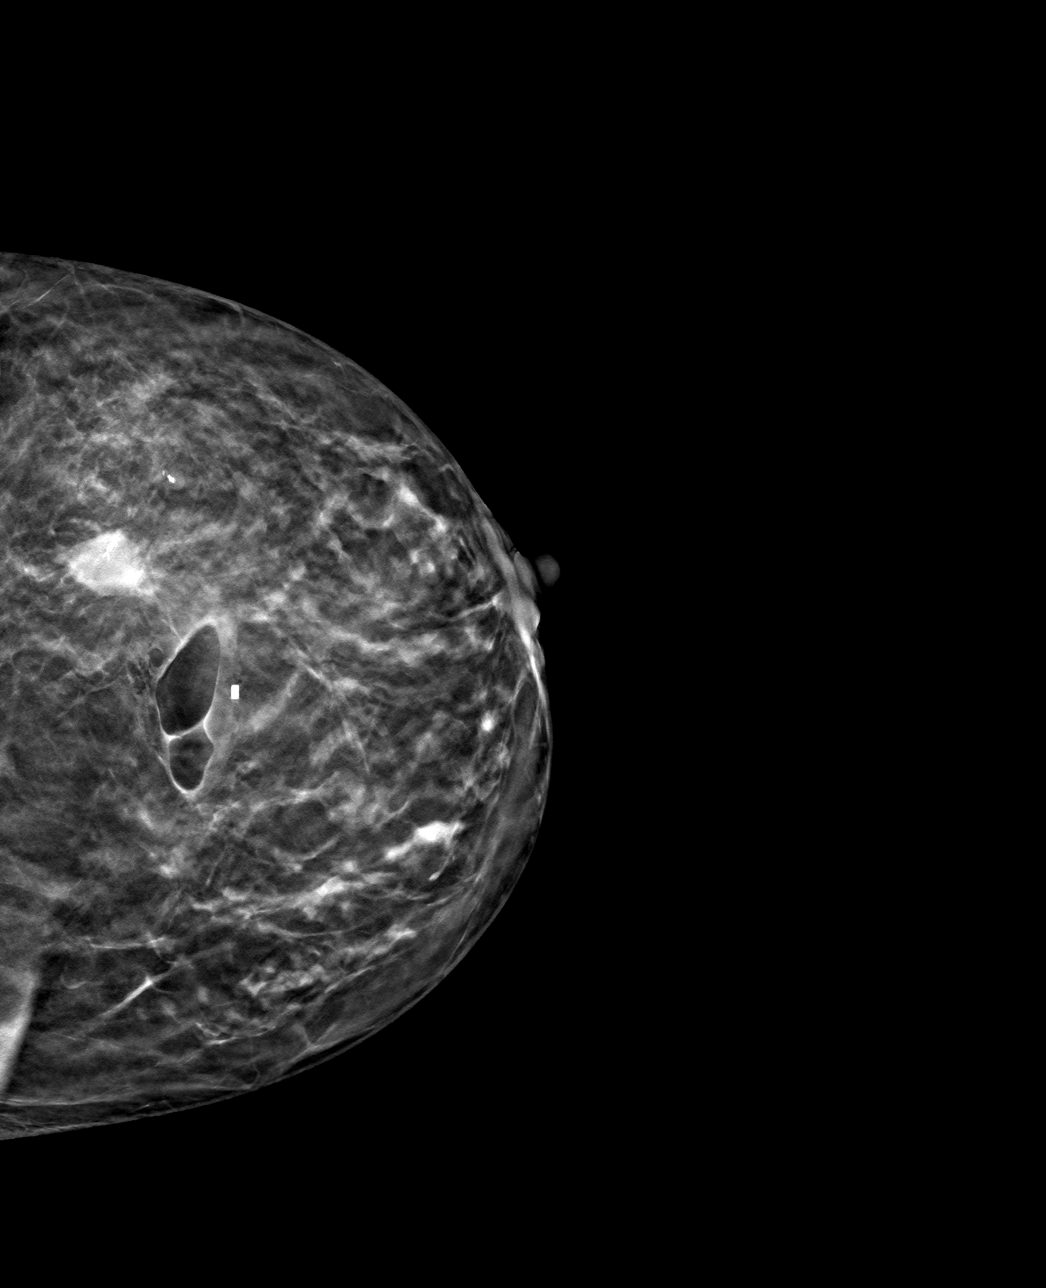

[L ML tomo · tomo slice 35/68.0]
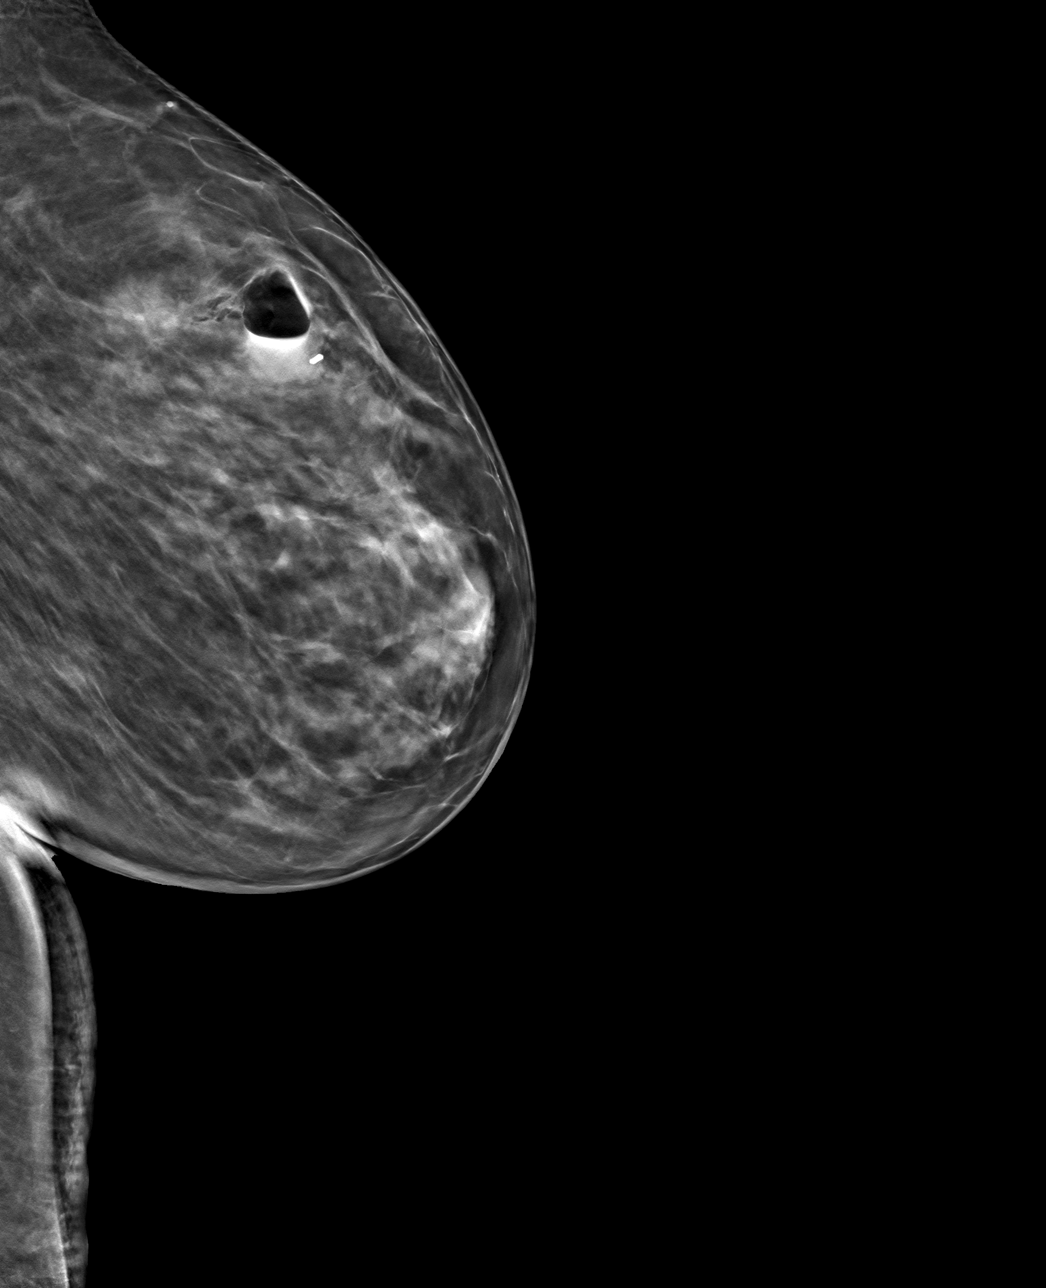

[4 of 12 positions shown; findings below may reference images not displayed]

FINDINGS: Mammographic images were obtained following MR guided biopsy of the
anterosuperior aspect of non masslike enhancement within the
UPPER-OUTER LEFT breast.

The CYLINDER biopsy marking clip is in expected position at the site
of biopsy and located 5 cm from the COIL biopsy marking clip (prior
biopsy-proven malignancy).
IMPRESSION: Appropriate positioning of the CYLINDER shaped biopsy marking clip
at the site of biopsy in the UPPER OUTER LEFT breast.

Final Assessment: Post Procedure Mammograms for Marker Placement

## 2019-11-01 MED ORDER — GADOBUTROL 1 MMOL/ML IV SOLN
8.0000 mL | Freq: Once | INTRAVENOUS | Status: AC | PRN
  Administered 2019-11-01: 8 mL via INTRAVENOUS

## 2019-11-01 NOTE — Telephone Encounter (Signed)
Revealed negative genetic testing.  Discussed that we do not know why she has breast cancer or why there is cancer in the family. It could be sporadic, due to a different gene that we are not testing, or maybe our current technology may not be able to pick something up.  It will be important for her to keep in contact with genetics to keep up with whether additional testing may be needed.   

## 2019-11-04 ENCOUNTER — Ambulatory Visit: Payer: Self-pay | Admitting: Genetic Counselor

## 2019-11-04 ENCOUNTER — Encounter: Payer: Self-pay | Admitting: Genetic Counselor

## 2019-11-04 ENCOUNTER — Encounter: Payer: Self-pay | Admitting: *Deleted

## 2019-11-04 ENCOUNTER — Other Ambulatory Visit: Payer: Self-pay | Admitting: Oncology

## 2019-11-04 DIAGNOSIS — Z9989 Dependence on other enabling machines and devices: Secondary | ICD-10-CM | POA: Diagnosis not present

## 2019-11-04 DIAGNOSIS — Z171 Estrogen receptor negative status [ER-]: Secondary | ICD-10-CM

## 2019-11-04 DIAGNOSIS — Z1379 Encounter for other screening for genetic and chromosomal anomalies: Secondary | ICD-10-CM

## 2019-11-04 DIAGNOSIS — Z803 Family history of malignant neoplasm of breast: Secondary | ICD-10-CM

## 2019-11-04 DIAGNOSIS — C50412 Malignant neoplasm of upper-outer quadrant of left female breast: Secondary | ICD-10-CM

## 2019-11-04 DIAGNOSIS — G4733 Obstructive sleep apnea (adult) (pediatric): Secondary | ICD-10-CM | POA: Diagnosis not present

## 2019-11-04 NOTE — Progress Notes (Signed)
HPI:  Ms. Laramee was previously seen in the [facility] clinic due to a personal and family history of breast cancer and concerns regarding a hereditary predisposition to cancer. Please refer to our prior cancer genetics clinic note for more information regarding our discussion, assessment and recommendations, at the time. Ms. Willits recent genetic test results were disclosed to her, as were recommendations warranted by these results. These results and recommendations are discussed in more detail below.  CANCER HISTORY:  In September 2021, at the age of 69, Ms. 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 The preliminary treatment plan neoadjuvant chemotherapy, lumpectomy, and adjuvant radiation.  Oncology History  Malignant neoplasm of upper-outer quadrant of left breast in female, estrogen receptor negative ([place])  10/03/2019 Initial Diagnosis   Malignant neoplasm of upper-outer quadrant of left breast in female, estrogen receptor negative ([place])   10/22/2019 -  Chemotherapy   The patient had dexamethasone (DECADRON) 4 MG tablet, 1 of 1 cycle, Start date: 10/09/2019, End date: -- DOXOrubicin (ADRIAMYCIN) chemo injection 116 mg, 60 mg/m2 = 116 mg, Intravenous,  Once, 1 of 4 cycles Administration: 116 mg (10/22/2019) palonosetron (ALOXI) injection 0.25 mg, 0.25 mg, Intravenous,  Once, 1 of 8 cycles Administration: 0.25 mg (10/22/2019) pegfilgrastim-jmdb (FULPHILA) injection 6 mg, 6 mg, Subcutaneous,  Once, 1 of 4 cycles Administration: 6 mg (10/24/2019) CARBOplatin (PARAPLATIN) in sodium chloride 0.9 % 100 mL chemo infusion, , Intravenous,  Once, 0 of 4 cycles cyclophosphamide (CYTOXAN) 1,160 mg in sodium chloride 0.9 % 250 mL chemo infusion, 600 mg/m2 = 1,160 mg, Intravenous,  Once, 1 of 4 cycles Administration: 1,160 mg (10/22/2019) PACLitaxel (TAXOL) 156 mg in sodium chloride 0.9 % 250 mL chemo infusion (</= 70m/m2), 80 mg/m2,  Intravenous,  Once, 0 of 4 cycles fosaprepitant (EMEND) 150 mg in sodium chloride 0.9 % 145 mL IVPB, 150 mg, Intravenous,  Once, 1 of 8 cycles Administration: 150 mg (10/22/2019)  for chemotherapy treatment.    10/30/2019 Genetic Testing   Negative genetic testing: no pathogenic variants detected in [facility] Common Hereditary Cancers Panel.  The report date is October 30, 2019.   The Common Hereditary Cancers Panel offered by [facility] includes sequencing and/or deletion duplication testing of the following 48 genes: APC, ATM, AXIN2, BARD1, BMPR1A, BRCA1, BRCA2, BRIP1, CDH1, CDK4, CDKN2A (p14ARF), CDKN2A (p16INK4a), CHEK2, CTNNA1, DICER1, EPCAM (Deletion/duplication testing only), GREM1 (promoter region deletion/duplication testing only), KIT, MEN1, MLH1, MSH2, MSH3, MSH6, MUTYH, NBN, NF1, NHTL1, PALB2, PDGFRA, PMS2, POLD1, POLE, PTEN, RAD50, RAD51C, RAD51D, RNF43, SDHB, SDHC, SDHD, SMAD4, SMARCA4. STK11, TP53, TSC1, TSC2, and VHL.  The following genes were evaluated for sequence changes only: SDHA and HOXB13 c.251G>A variant only.     FAMILY HISTORY:  We obtained a detailed, 4-generation family history.  Significant diagnoses are listed below: Family History  Problem Relation Age of Onset   Breast cancer Cousin 457      maternal cousin; bilateral      Ms. AHasemanhas one daughter, age 87067 and one son, age 87044 both without a cancer history.  Ms. ACassettahas two brothers without a cancer history.  One brother is living at age 70 and her other brother passed away at 368  Ms. ADensmoremother is 70years old.  Ms. ARiccobonohas one maternal cousin, now in her mid 612s who was diagnosed with bilateral breast cancer around the age of 490 No other maternal family history of cancer was reported. Ms. AScrimabiological father passed  away in his 38s and did not have cancer.  She has limited information about her biological father's family.   Ms. Varone is unaware of previous family history of genetic  testing for hereditary cancer risks. Patient's maternal ancestors are of African American descent, and paternal ancestors are of African American descent. There is no reported Ashkenazi Jewish ancestry. There is no known consanguinity.  GENETIC TEST RESULTS: Genetic testing reported out on October 30, 2019.  The Common Hereditary Cancers Panel found no pathogenic mutations. The Common Hereditary Cancers Panel offered by [facility] includes sequencing and/or deletion duplication testing of the following 48 genes: APC, ATM, AXIN2, BARD1, BMPR1A, BRCA1, BRCA2, BRIP1, CDH1, CDK4, CDKN2A (p14ARF), CDKN2A (p16INK4a), CHEK2, CTNNA1, DICER1, EPCAM (Deletion/duplication testing only), GREM1 (promoter region deletion/duplication testing only), KIT, MEN1, MLH1, MSH2, MSH3, MSH6, MUTYH, NBN, NF1, NHTL1, PALB2, PDGFRA, PMS2, POLD1, POLE, PTEN, RAD50, RAD51C, RAD51D, RNF43, SDHB, SDHC, SDHD, SMAD4, SMARCA4. STK11, TP53, TSC1, TSC2, and VHL.  The following genes were evaluated for sequence changes only: SDHA and HOXB13 c.251G>A variant only.  The test report has been scanned into EPIC and is located under the Molecular Pathology section of the Results Review tab.  A portion of the result report is included below for reference.     We discussed with Ms. Kocsis that because current genetic testing is not perfect, it is possible there may be a gene mutation in one of these genes that current testing cannot detect, but that chance is small.  We also discussed, that there could be another gene that has not yet been discovered, or that we have not yet tested, that is responsible for the cancer diagnoses in the family. It is also possible there is a hereditary cause for the cancer in the family that Ms. Pollick did not inherit and therefore was not identified in her testing.  Therefore, it is important to remain in touch with cancer genetics in the future so that we can continue to offer Ms. Tamas the most up to date genetic  testing.   ADDITIONAL GENETIC TESTING: We discussed with Ms. Wahlert that there are other genes that are associated with increased cancer risk that can be analyzed. Should Ms. Sarnowski wish to pursue additional genetic testing, we are happy to discuss and coordinate this testing, at any time.      CANCER SCREENING RECOMMENDATIONS: Ms. Carby test result is considered negative (normal).  This means that we have not identified a hereditary cause for her personal and family history of breast cancer at this time. Most cancers happen by chance and this negative test suggests that her cancer may fall into this category.    While reassuring, this does not definitively rule out a hereditary predisposition to cancer. It is still possible that there could be genetic mutations that are undetectable by current technology. There could be genetic mutations in genes that have not been tested or identified to increase cancer risk.  Therefore, it is recommended she continue to follow the cancer management and screening guidelines provided by her oncology and primary healthcare provider.   An individual's cancer risk and medical management are not determined by genetic test results alone. Overall cancer risk assessment incorporates additional factors, including personal medical history, family history, and any available genetic information that may result in a personalized plan for cancer prevention and surveillance  RECOMMENDATIONS FOR FAMILY MEMBERS:  Individuals in this family might be at some increased risk of developing cancer, over the general population risk, simply due to  the family history of cancer.  We recommended women in this family have a yearly mammogram beginning at age 23, or 79 years younger than the earliest onset of cancer, an annual clinical breast exam, and perform monthly breast self-exams. Women in this family should also have a gynecological exam as recommended by their primary provider. All  family members should be referred for colonoscopy starting at age 13.  It is also possible there is a hereditary cause for the cancer in Ms. Thibeau's family that she did not inherit and therefore was not identified in her.  Based on Ms. Holthaus's family history, we recommended her maternal cousin, who was diagnosed with breast cancer at age 35, have genetic counseling and testing. Ms. Candy will let us know if we can be of any assistance in coordinating genetic counseling and/or testing for this family member.   FOLLOW-UP: Lastly, we discussed with Ms. Mchan that cancer genetics is a rapidly advancing field and it is possible that new genetic tests will be appropriate for her and/or her family members in the future. We encouraged her to remain in contact with cancer genetics on an annual basis so we can update her personal and family histories and let her know of advances in cancer genetics that may benefit this family.   Our contact number was provided. Ms. Haslem questions were answered to her satisfaction, and she knows she is welcome to call us at anytime with additional questions or concerns.   Jakaylee Sasaki M. Joette Catching, [place], [employer]/video [email or web address] (P) [phone]

## 2019-11-04 NOTE — Progress Notes (Signed)
**Note [facility] Mcdonald** [place]   [phone] [phone]   Clinic Follow up Note   Patient Care Team: Benito Mccreedy, MD as PCP - General (Internal Medicine) Servando Salina, MD as Consulting Physician (Obstetrics and Gynecology) Mauro Kaufmann, RN as Oncology Nurse Navigator Rockwell Germany, RN as Oncology Nurse Navigator [name], MD as Consulting Physician (General Surgery) Magrinat, Virgie Dad, MD as Consulting Physician (Oncology) Gery Pray, MD as Consulting Physician (Radiation Oncology) Dimas Aguas, MD as Consulting Physician (Nephrology) 11/05/2019  CHIEF COMPLAINT: Follow-up triple negative left breast cancer  CURRENT THERAPY: Neoadjuvant chemotherapy  INTERVAL HISTORY: Ms. Deeley returns for follow-up and treatment as scheduled.  She completed cycle 1 AC on 10/12 and Fulphila on 10/14.  She got up the next day and walked.  She had "twinges" of nausea and constipation that resolved.  She had mild fatigue that recovered, able to do whatever she wants.  She is able to eat and drink well.  She got the Covid booster last week.  She has soreness on the outside of her throat to touch, but not on the inside.  Denies any nasal drainage any fever, chills, cough, chest pain, dyspnea.  She no longer feels the left breast mass.    MEDICAL HISTORY:  Past Medical History:  Diagnosis Date  . Breast cancer ([place])   . Obesity   . Sleep apnea     SURGICAL HISTORY: Past Surgical History:  Procedure Laterality Date  . ABDOMINAL HYSTERECTOMY    . IR IMAGING GUIDED PORT INSERTION  10/18/2019    I have reviewed the social history and family history with the patient and they are unchanged from previous note.  ALLERGIES:  is allergic to other and shellfish allergy.  MEDICATIONS:  Current Outpatient Medications  Medication Sig Dispense Refill  . Cholecalciferol 125 MCG (5000 UT) capsule Take by mouth.    . dexamethasone (DECADRON) 4 MG tablet  Take 2 tablets (8 mg) by mouth twice daily days 2 and 3 after chemo, then take one last dose the morning of day 4; take with food 30 tablet 1  . diclofenac sodium (VOLTAREN) 1 % GEL Apply 1 application topically 4 (four) times daily.      Marland Kitchen lidocaine-prilocaine (EMLA) cream Apply to affected area once 30 g 3  . loratadine (CLARITIN) 10 MG tablet Take 1 tablet (10 mg total) by mouth daily. 40 tablet 0  . LORazepam (ATIVAN) 0.5 MG tablet Take 1 tablet (0.5 mg total) by mouth at bedtime as needed (Nausea or vomiting). 20 tablet 0  . meloxicam (MOBIC) 15 MG tablet Take 15 mg by mouth daily. With food     . Naltrexone-buPROPion HCl ER (CONTRAVE) 8-90 MG TB12     . prochlorperazine (COMPAZINE) 10 MG tablet Take 1 tablet (10 mg total) by mouth every 6 (six) hours as needed (Nausea or vomiting). 30 tablet 1   No current facility-administered medications for this visit.   Facility-Administered Medications Ordered in Other Visits  Medication Dose Route Frequency Provider Last Rate Last Admin  . cyclophosphamide (CYTOXAN) 1,160 mg in sodium chloride 0.9 % 250 mL chemo infusion  600 mg/m2 (Treatment Plan Recorded) Intravenous Once Magrinat, Virgie Dad, MD      . dexamethasone (DECADRON) 10 mg in sodium chloride 0.9 % 50 mL IVPB  10 mg Intravenous Once Magrinat, Virgie Dad, MD 204 mL/hr at 11/05/19 1257 10 mg at 11/05/19 1257  . DOXOrubicin (ADRIAMYCIN) chemo injection 116 mg  60 mg/m2 (Treatment Plan Recorded)  Intravenous Once Magrinat, Virgie Dad, MD      . fosaprepitant (EMEND) 150 mg in sodium chloride 0.9 % 145 mL IVPB  150 mg Intravenous Once Magrinat, Virgie Dad, MD      . heparin lock flush 100 unit/mL  500 Units Intracatheter Once PRN Magrinat, Virgie Dad, MD      . sodium chloride flush (NS) 0.9 % injection 10 mL  10 mL Intracatheter PRN Magrinat, Virgie Dad, MD        PHYSICAL EXAMINATION: ECOG PERFORMANCE STATUS: 0 - Asymptomatic  Vitals:   11/05/19 1107  BP: (!) 144/69  Pulse: 73  Resp: 17  Temp:  98.3 F (36.8 C)  SpO2: 100%   Filed Weights   11/05/19 1107  Weight: 190 lb 3.2 oz (86.3 kg)    GENERAL:alert, no distress and comfortable SKIN: No rash to exposed skin EYES: sclera clear OROPHARYNX: No erythema, thrush, or ulcers NECK: Without mass LYMPH:  no palpable cervical or supraclavicular lymphadenopathy  LUNGS: clear with normal breathing effort HEART: regular rate & rhythm, no lower extremity edema NEURO: alert & oriented x 3 with fluent speech Breast exam: S/p left upper outer quadrant breast biopsy with Steri-Strips and Band-Aid with soft tissue fullness underneath the dressing, no other palpable mass  LABORATORY DATA:  I have reviewed the data as listed CBC Latest Ref Rng & Units 11/05/2019 10/29/2019 10/22/2019  WBC 4.0 - 10.5 K/uL 11.8(H) 5.3 5.2  Hemoglobin 12.0 - 15.0 g/dL 11.2(L) 12.3 12.9  Hematocrit 36 - 46 % 33.9(L) 36.6 38.5  Platelets 150 - 400 K/uL 105(L) 82(L) 164     CMP Latest Ref Rng & Units 11/05/2019 10/29/2019 10/22/2019  Glucose 70 - 99 mg/dL 99 83 109(H)  BUN 8 - 23 mg/dL 15 24(H) 21  Creatinine 0.44 - 1.00 mg/dL 0.88 0.84 0.95  Sodium 135 - 145 mmol/L 140 137 139  Potassium 3.5 - 5.1 mmol/L 3.6 4.2 3.7  Chloride 98 - 111 mmol/L 108 105 108  CO2 22 - 32 mmol/L 25 28 27   Calcium 8.9 - 10.3 mg/dL 9.0 9.7 9.7  Total Protein 6.5 - 8.1 g/dL 6.2(L) 6.4(L) 7.1  Total Bilirubin 0.3 - 1.2 mg/dL <0.2(L) <0.2(L) 0.3  Alkaline Phos 38 - 126 U/L 88 95 71  AST 15 - 41 U/L 12(L) 12(L) 14(L)  ALT 0 - 44 U/L 12 13 13       RADIOGRAPHIC STUDIES: I have personally reviewed the radiological images as listed and agreed with the findings in the report. No results found.   ASSESSMENT & PLAN: 69 y.o.female   1. Left breast cancer -s/p left breast upper outer quadrant biopsy 09/27/2019 showed cT1-T2 N0, stage IA- IIA invasive ductal carcinoma, grade 3, triple negative, with an MIB-1 of 90%. -genetics negative, no pathogenic mutation -MRI 10/21/19  showed biopsy-proven malignancy in the upper outer left breast and in the upper outer left breast lateral and inferior to the primary mass measuring a total of 3.3 cm in greatest dimension and an additional surrounding heterogeneous non-mass enhancement located primarily superior to these masses which in total spans 2.4 cm x 4.5 cm x 5.4 cm, all suspicious for malignancy. -She began neoadjuvant chemo ddAC-carbo/Taxol x12 on 10/12, s/p cycle 1 AC -Subsequent biopsy of the non-mass enhancement on 11/01/2019 showed benign breast tissue which is found to be discordant with possible sampling error as the non-mass-like enhancement identified on MR is suspicious  -Her treatment plan includes definitive surgery to follow chemo, then adjuvant radiation if appropriate  Disposition: Ms. Pinho appears stable.  She completed 1 cycle of neoadjuvant AC.  She tolerated treatment very well with mild fatigue, nausea, and constipation.  Symptoms were managed with supportive care at home.  She was able to recover and function well and remain very active.  I reviewed her breast biopsy from 10/22 which shows benign breast tissue, however this was found to be discordant with the MRI and is still suspicious for malignancy.  We discussed proceeding with a repeat biopsy if she desires lumpectomy, she tells me she is okay with a mastectomy in which case we can defer another biopsy at this point. I sent a message to Dr. Ninfa Linden.  Today's breast exam is consistent with a clinical response to treatment.  Her CBC and CMP are adequate to proceed with cycle 2 AC as planned.  The case was discussed with Dr. Jana Hakim. Return 10/28 for injection, then follow-up with MD on 11/8 with cycle 3.  All questions were answered. The patient knows to call the clinic with any problems, questions or concerns. No barriers to learning were detected.  Total encounter time was 30 minutes.     Alla Feeling, NP 11/05/19

## 2019-11-05 ENCOUNTER — Inpatient Hospital Stay: Payer: Medicare HMO

## 2019-11-05 ENCOUNTER — Other Ambulatory Visit: Payer: Self-pay

## 2019-11-05 ENCOUNTER — Inpatient Hospital Stay: Payer: Medicare HMO | Admitting: Nurse Practitioner

## 2019-11-05 ENCOUNTER — Encounter: Payer: Self-pay | Admitting: Nurse Practitioner

## 2019-11-05 VITALS — BP 144/69 | HR 73 | Temp 98.3°F | Resp 17 | Ht 63.5 in | Wt 190.2 lb

## 2019-11-05 DIAGNOSIS — C50412 Malignant neoplasm of upper-outer quadrant of left female breast: Secondary | ICD-10-CM | POA: Diagnosis not present

## 2019-11-05 DIAGNOSIS — Z95828 Presence of other vascular implants and grafts: Secondary | ICD-10-CM

## 2019-11-05 DIAGNOSIS — Z171 Estrogen receptor negative status [ER-]: Secondary | ICD-10-CM

## 2019-11-05 DIAGNOSIS — G4730 Sleep apnea, unspecified: Secondary | ICD-10-CM | POA: Diagnosis not present

## 2019-11-05 DIAGNOSIS — Z5111 Encounter for antineoplastic chemotherapy: Secondary | ICD-10-CM | POA: Diagnosis not present

## 2019-11-05 DIAGNOSIS — E669 Obesity, unspecified: Secondary | ICD-10-CM | POA: Diagnosis not present

## 2019-11-05 DIAGNOSIS — Z79899 Other long term (current) drug therapy: Secondary | ICD-10-CM | POA: Diagnosis not present

## 2019-11-05 DIAGNOSIS — Z7689 Persons encountering health services in other specified circumstances: Secondary | ICD-10-CM | POA: Diagnosis not present

## 2019-11-05 DIAGNOSIS — Z5189 Encounter for other specified aftercare: Secondary | ICD-10-CM | POA: Diagnosis not present

## 2019-11-05 LAB — CBC WITH DIFFERENTIAL (CANCER CENTER ONLY)
Abs Immature Granulocytes: 1.34 10*3/uL — ABNORMAL HIGH (ref 0.00–0.07)
Basophils Absolute: 0.2 10*3/uL — ABNORMAL HIGH (ref 0.0–0.1)
Basophils Relative: 1 %
Eosinophils Absolute: 0.1 10*3/uL (ref 0.0–0.5)
Eosinophils Relative: 1 %
HCT: 33.9 % — ABNORMAL LOW (ref 36.0–46.0)
Hemoglobin: 11.2 g/dL — ABNORMAL LOW (ref 12.0–15.0)
Immature Granulocytes: 11 %
Lymphocytes Relative: 11 %
Lymphs Abs: 1.3 10*3/uL (ref 0.7–4.0)
MCH: 30.7 pg (ref 26.0–34.0)
MCHC: 33 g/dL (ref 30.0–36.0)
MCV: 92.9 fL (ref 80.0–100.0)
Monocytes Absolute: 0.8 10*3/uL (ref 0.1–1.0)
Monocytes Relative: 7 %
Neutro Abs: 8.2 10*3/uL — ABNORMAL HIGH (ref 1.7–7.7)
Neutrophils Relative %: 69 %
Platelet Count: 105 10*3/uL — ABNORMAL LOW (ref 150–400)
RBC: 3.65 MIL/uL — ABNORMAL LOW (ref 3.87–5.11)
RDW: 12.3 % (ref 11.5–15.5)
WBC Count: 11.8 10*3/uL — ABNORMAL HIGH (ref 4.0–10.5)
nRBC: 0 % (ref 0.0–0.2)

## 2019-11-05 LAB — CMP (CANCER CENTER ONLY)
ALT: 12 U/L (ref 0–44)
AST: 12 U/L — ABNORMAL LOW (ref 15–41)
Albumin: 3.5 g/dL (ref 3.5–5.0)
Alkaline Phosphatase: 88 U/L (ref 38–126)
Anion gap: 7 (ref 5–15)
BUN: 15 mg/dL (ref 8–23)
CO2: 25 mmol/L (ref 22–32)
Calcium: 9 mg/dL (ref 8.9–10.3)
Chloride: 108 mmol/L (ref 98–111)
Creatinine: 0.88 mg/dL (ref 0.44–1.00)
GFR, Estimated: >60 mL/min (ref >60)
Glucose, Bld: 99 mg/dL (ref 70–99)
Potassium: 3.6 mmol/L (ref 3.5–5.1)
Sodium: 140 mmol/L (ref 135–145)
Total Bilirubin: <0.2 mg/dL — ABNORMAL LOW (ref 0.3–1.2)
Total Protein: 6.2 g/dL — ABNORMAL LOW (ref 6.5–8.1)

## 2019-11-05 MED ORDER — SODIUM CHLORIDE 0.9% FLUSH
10.0000 mL | INTRAVENOUS | Status: DC | PRN
  Administered 2019-11-05: 10 mL
  Filled 2019-11-05: qty 10

## 2019-11-05 MED ORDER — SODIUM CHLORIDE 0.9 % IV SOLN
600.0000 mg/m2 | Freq: Once | INTRAVENOUS | Status: AC
  Administered 2019-11-05: 1160 mg via INTRAVENOUS
  Filled 2019-11-05: qty 58

## 2019-11-05 MED ORDER — SODIUM CHLORIDE 0.9% FLUSH
10.0000 mL | Freq: Once | INTRAVENOUS | Status: AC
  Administered 2019-11-05: 10 mL
  Filled 2019-11-05: qty 10

## 2019-11-05 MED ORDER — PALONOSETRON HCL INJECTION 0.25 MG/5ML
0.2500 mg | Freq: Once | INTRAVENOUS | Status: AC
  Administered 2019-11-05: 0.25 mg via INTRAVENOUS

## 2019-11-05 MED ORDER — SODIUM CHLORIDE 0.9 % IV SOLN
Freq: Once | INTRAVENOUS | Status: AC
  Filled 2019-11-05: qty 250

## 2019-11-05 MED ORDER — SODIUM CHLORIDE 0.9 % IV SOLN
150.0000 mg | Freq: Once | INTRAVENOUS | Status: AC
  Administered 2019-11-05: 150 mg via INTRAVENOUS
  Filled 2019-11-05: qty 150

## 2019-11-05 MED ORDER — SODIUM CHLORIDE 0.9 % IV SOLN
10.0000 mg | Freq: Once | INTRAVENOUS | Status: AC
  Administered 2019-11-05: 10 mg via INTRAVENOUS
  Filled 2019-11-05: qty 10

## 2019-11-05 MED ORDER — HEPARIN SOD (PORK) LOCK FLUSH 100 UNIT/ML IV SOLN
500.0000 [IU] | Freq: Once | INTRAVENOUS | Status: AC | PRN
  Administered 2019-11-05: 500 [IU]
  Filled 2019-11-05: qty 5

## 2019-11-05 MED ORDER — PALONOSETRON HCL INJECTION 0.25 MG/5ML
INTRAVENOUS | Status: AC
  Filled 2019-11-05: qty 5

## 2019-11-05 MED ORDER — DOXORUBICIN HCL CHEMO IV INJECTION 2 MG/ML
60.0000 mg/m2 | Freq: Once | INTRAVENOUS | Status: AC
  Administered 2019-11-05: 116 mg via INTRAVENOUS
  Filled 2019-11-05: qty 58

## 2019-11-05 NOTE — Progress Notes (Signed)
Nutrition Assessment   Reason for Assessment:  Patient identified by attending Breast Clinic   ASSESSMENT:  69 year old female with triple negative breast cancer.  Past medical history of sleep apnea.  Patient receiving neoadjuvant chemotherapy.    Met with patient during infusion.  Patient reports that prior to diagnosis she was loosing weight with intermittent fasting.  She has not been doing that while on treatment.  She reports good appetite and tolerated first round of treatment well.    Medications: reviewed   Labs: reviewed   Anthropometrics:   Height: 63.5 inches Weight: 190 lb today UBW: 183 lb in 10/09/19 BMI: 32   NUTRITION DIAGNOSIS: none at this time   INTERVENTION:  Encouraged patient to focus on eating well-balanced diet including good sources of protein during treatment and weight maintenance.   Contact information provided   MONITORING, EVALUATION, GOAL: weight trends, intake   Next Visit: no follow-up Patient to notify with questions  Cheyenne Mcdonald, [place], [place] Registered Dietitian [phone] (mobile)

## 2019-11-05 NOTE — Patient Instructions (Signed)
[facility] Discharge Instructions for Patients Receiving Chemotherapy  Today you received the following chemotherapy agents doxorubicin, cytoxan  To help prevent nausea and vomiting after your treatment, we encourage you to take your nausea medication as directed.   If you develop nausea and vomiting that is not controlled by your nausea medication, call the clinic.   BELOW ARE SYMPTOMS THAT SHOULD BE REPORTED IMMEDIATELY:  *FEVER GREATER THAN 100.5 F  *CHILLS WITH OR WITHOUT FEVER  NAUSEA AND VOMITING THAT IS NOT CONTROLLED WITH YOUR NAUSEA MEDICATION  *UNUSUAL SHORTNESS OF BREATH  *UNUSUAL BRUISING OR BLEEDING  TENDERNESS IN MOUTH AND THROAT WITH OR WITHOUT PRESENCE OF ULCERS  *URINARY PROBLEMS  *BOWEL PROBLEMS  UNUSUAL RASH Items with * indicate a potential emergency and should be followed up as soon as possible.  Feel free to call the clinic should you have any questions or concerns. The clinic phone number is [phone].  Please show the CHEMO ALERT CARD at check-in to the Emergency Department and triage nurse.   

## 2019-11-07 ENCOUNTER — Telehealth: Payer: Self-pay | Admitting: Nurse Practitioner

## 2019-11-07 ENCOUNTER — Other Ambulatory Visit: Payer: Self-pay

## 2019-11-07 ENCOUNTER — Inpatient Hospital Stay: Payer: Medicare HMO

## 2019-11-07 VITALS — BP 131/77 | HR 51 | Temp 97.8°F | Resp 18

## 2019-11-07 DIAGNOSIS — C50412 Malignant neoplasm of upper-outer quadrant of left female breast: Secondary | ICD-10-CM | POA: Diagnosis not present

## 2019-11-07 DIAGNOSIS — Z5189 Encounter for other specified aftercare: Secondary | ICD-10-CM | POA: Diagnosis not present

## 2019-11-07 DIAGNOSIS — G4730 Sleep apnea, unspecified: Secondary | ICD-10-CM | POA: Diagnosis not present

## 2019-11-07 DIAGNOSIS — Z7689 Persons encountering health services in other specified circumstances: Secondary | ICD-10-CM | POA: Diagnosis not present

## 2019-11-07 DIAGNOSIS — Z5111 Encounter for antineoplastic chemotherapy: Secondary | ICD-10-CM | POA: Diagnosis not present

## 2019-11-07 DIAGNOSIS — Z79899 Other long term (current) drug therapy: Secondary | ICD-10-CM | POA: Diagnosis not present

## 2019-11-07 DIAGNOSIS — Z171 Estrogen receptor negative status [ER-]: Secondary | ICD-10-CM | POA: Diagnosis not present

## 2019-11-07 DIAGNOSIS — E669 Obesity, unspecified: Secondary | ICD-10-CM | POA: Diagnosis not present

## 2019-11-07 MED ORDER — PEGFILGRASTIM-JMDB 6 MG/0.6ML ~~LOC~~ SOSY
PREFILLED_SYRINGE | SUBCUTANEOUS | Status: AC
  Filled 2019-11-07: qty 0.6

## 2019-11-07 MED ORDER — PEGFILGRASTIM-JMDB 6 MG/0.6ML ~~LOC~~ SOSY
6.0000 mg | PREFILLED_SYRINGE | Freq: Once | SUBCUTANEOUS | Status: AC
  Administered 2019-11-07: 6 mg via SUBCUTANEOUS

## 2019-11-07 NOTE — Patient Instructions (Signed)

## 2019-11-07 NOTE — Telephone Encounter (Signed)
No 10/26 los 

## 2019-11-18 NOTE — Progress Notes (Signed)
[place]  [phone] Fax:([phone]) [phone]     ID: Cheyenne Mcdonald DOB: 1950-11-22  MR#: [id number]  SEG#:[id number]  Patient Care Team: Benito Mccreedy, MD as PCP - General (Internal Medicine) Servando Salina, MD as Consulting Physician (Obstetrics and Gynecology) Mauro Kaufmann, RN as Oncology Nurse Navigator Rockwell Germany, RN as Oncology Nurse Navigator [name], MD as Consulting Physician (General Surgery) Bayla Mcgovern, Virgie Dad, MD as Consulting Physician (Oncology) Gery Pray, MD as Consulting Physician (Radiation Oncology) Dimas Aguas, MD as Consulting Physician (Nephrology) Chauncey Cruel, MD OTHER MD:  CHIEF COMPLAINT: triple negative breast cancer  CURRENT TREATMENT: Neoadjuvant chemotherapy   INTERVAL HISTORY: Enyla returns today for follow up of her triple negative breast cancer.   She continues on neoadjuvant chemotherapy, consisting of doxorubicin and cyclophosphamide in dose dense fashion x4, on 10/22/2019. Today is day 1 cycle 3.  Her most recent echocardiogram from 10/17/2019 showed an ejection fraction of 60-65%.   REVIEW OF SYSTEMS: Dominica tells me her last week was pretty much normal week.  She did lose her hair.  She has a very becoming wig.  Port is working well.  She has no mouth sores.  She has managed to control her constipation issue.  Occasionally she has a little bit of blurred vision and a little headache.  Otherwise a detailed review of systems today was stable   COVID 19 VACCINATION STATUS:    HISTORY OF CURRENT ILLNESS: From the original intake note:  Cheyenne Mcdonald presented for her routine mammography with a palpable left breast lump. She underwent bilateral diagnostic mammography with tomography and left breast ultrasonography at [facility] on 09/13/2019 showing: breast density category B; two adjacent masses in left breast at 1 o'clock, spanning approximately 3.3 cm; one indeterminate  left axillary lymph node with 0.4 cm cortical bulge; no evidence right breast malignancy.  Accordingly on 09/27/2019 she proceeded to biopsy of the left breast areas in question. The pathology from this procedure ([id number]) showed: invasive ductal carcinoma, grade 3, present in both of the adjacent masses. Prognostic indicators significant for: estrogen receptor, 0% negative and progesterone receptor, 0% negative. Proliferation marker Ki67 at 90%. HER2 equivocal by immunohistochemistry (2+), but negative by fluorescent in situ hybridization with a signals ratio 2.2 and number per cell 3.3. Additional analysis of more cells showed a signals ratio 2.05 and number per cell 3.13.  The biopsied lymph node was negative for carcinoma.  This was felt to be concordant  The patient's subsequent history is as detailed below.   PAST MEDICAL HISTORY: Past Medical History:  Diagnosis Date  . Breast cancer ([place])   . Obesity   . Sleep apnea   heart murmur (since childhood)   PAST SURGICAL HISTORY: Past Surgical History:  Procedure Laterality Date  . ABDOMINAL HYSTERECTOMY    . IR IMAGING GUIDED PORT INSERTION  10/18/2019    FAMILY HISTORY: Family History  Problem Relation Age of Onset  . Breast cancer Cousin 15       maternal cousin; bilateral     Her father died at age 38, cause of death unknown. Her mother is age 69 as of 09/2019. [name] has two brothers (and no sisters). She reports one cousin with breast cancer at age 9.   GYNECOLOGIC HISTORY:  No LMP recorded. Patient has had a hysterectomy. Menarche: 69 years old Age at first live birth: 69 years old [place] P 2 LMP 1990 Contraceptive: used for maybe 20 years HRT: never  used  Hysterectomy? Yes, 1990 BSO? 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 Husband [name] is retired Nature conservation officer and then retired Civil Service fast streamer.  At home is just the 2 of them. Daughter [name], age 63, works in Wheeler entry in  [employer]. Son [name], age 58, has a college degree but works for [employer] in [employer]. [name] has four grandchildren. She attends [employer].    ADVANCED DIRECTIVES: In place   HEALTH MAINTENANCE: Social History   Tobacco Use  . Smoking status: Never Smoker  . Smokeless tobacco: Never Used  Substance Use Topics  . Alcohol use: Never  . Drug use: Never     Colonoscopy: 2018 (Dr. Collene Mares)  PAP: approx. 2013  Bone density: 2016, "normal"   Allergies  Allergen Reactions  . Other Itching  . Shellfish Allergy Itching    Current Outpatient Medications  Medication Sig Dispense Refill  . Cholecalciferol 125 MCG (5000 UT) capsule Take by mouth.    . dexamethasone (DECADRON) 4 MG tablet Take 2 tablets (8 mg) by mouth twice daily days 2 and 3 after chemo, then take one last dose the morning of day 4; take with food 30 tablet 1  . diclofenac sodium (VOLTAREN) 1 % GEL Apply 1 application topically 4 (four) times daily.      Marland Kitchen lidocaine-prilocaine (EMLA) cream Apply to affected area once 30 g 3  . loratadine (CLARITIN) 10 MG tablet Take 1 tablet (10 mg total) by mouth daily. 40 tablet 0  . LORazepam (ATIVAN) 0.5 MG tablet Take 1 tablet (0.5 mg total) by mouth at bedtime as needed (Nausea or vomiting). 20 tablet 0  . meloxicam (MOBIC) 15 MG tablet Take 15 mg by mouth daily. With food     . Naltrexone-buPROPion HCl ER (CONTRAVE) 8-90 MG TB12     . prochlorperazine (COMPAZINE) 10 MG tablet Take 1 tablet (10 mg total) by mouth every 6 (six) hours as needed (Nausea or vomiting). 30 tablet 1   No current facility-administered medications for this visit.    OBJECTIVE: African-American woman who appears well  Vitals:   11/19/19 1053  BP: (!) 143/59  Pulse: 67  Resp: 18  Temp: (!) 97 F (36.1 C)  SpO2: 100%     Body mass index is 32.83 kg/m.   Wt Readings from Last 3 Encounters:  11/19/19 188 lb 4.8 oz (85.4 kg)  11/05/19 190 lb 3.2 oz (86.3 kg)   10/29/19 186 lb 1.6 oz (84.4 kg)      ECOG FS:1 - Symptomatic but completely ambulatory  Sclerae unicteric, EOMs intact Wearing a mask No cervical or supraclavicular adenopathy Lungs no rales or rhonchi Heart regular rate and rhythm Abd soft, nontender, positive bowel sounds MSK no focal spinal tenderness, no upper extremity lymphedema Neuro: nonfocal, well oriented, appropriate affect Breasts: The mass in the left breast appears to be softer and much less discrete.   LAB RESULTS:  CMP     Component Value Date/Time   NA 140 11/05/2019 1058   K 3.6 11/05/2019 1058   CL 108 11/05/2019 1058   CO2 25 11/05/2019 1058   GLUCOSE 99 11/05/2019 1058   BUN 15 11/05/2019 1058   CREATININE 0.88 11/05/2019 1058   CALCIUM 9.0 11/05/2019 1058   PROT 6.2 (L) 11/05/2019 1058   ALBUMIN 3.5 11/05/2019 1058   AST 12 (L) 11/05/2019 1058   ALT 12 11/05/2019 1058   ALKPHOS 88  11/05/2019 1058   BILITOT <0.2 (L) 11/05/2019 1058   GFRNONAA >60 11/05/2019 1058   GFRAA 58 (L) 10/09/2019 0806    No results found for: TOTALPROTELP, ALBUMINELP, A1GS, A2GS, BETS, BETA2SER, GAMS, MSPIKE, SPEI  Lab Results  Component Value Date   WBC 13.3 (H) 11/19/2019   NEUTROABS PENDING 11/19/2019   HGB 11.1 (L) 11/19/2019   HCT 33.5 (L) 11/19/2019   MCV 93.6 11/19/2019   PLT 123 (L) 11/19/2019    No results found for: LABCA2  No components found for: VELFYB017  No results for input(s): INR in the last 168 hours.  No results found for: LABCA2  No results found for: PZW258  No results found for: NID782  No results found for: UMP536  No results found for: CA2729  No components found for: HGQUANT  No results found for: CEA1 / No results found for: CEA1   No results found for: AFPTUMOR  No results found for: CHROMOGRNA  No results found for: KPAFRELGTCHN, LAMBDASER, KAPLAMBRATIO (kappa/lambda light chains)  No results found for: HGBA, HGBA2QUANT, HGBFQUANT, HGBSQUAN (Hemoglobinopathy  evaluation)   No results found for: LDH  No results found for: IRON, TIBC, IRONPCTSAT (Iron and TIBC)  No results found for: FERRITIN  Urinalysis No results found for: COLORURINE, APPEARANCEUR, LABSPEC, PHURINE, GLUCOSEU, HGBUR, BILIRUBINUR, KETONESUR, PROTEINUR, UROBILINOGEN, NITRITE, LEUKOCYTESUR   STUDIES: DG Chest 1 View  Result Date: 10/22/2019 CLINICAL DATA:  Port placement EXAM: CHEST  1 VIEW COMPARISON:  2011 FINDINGS: Right chest wall port catheter tip overlies the SVC. No pneumothorax. Lungs are clear. No pleural effusion. Cardiomediastinal contours are within normal limits. IMPRESSION: Right chest wall port catheter tip overlies SVC.  No pneumothorax. Electronically Signed   By: Macy Mis M.D.   On: 10/22/2019 10:37   MR BREAST BILATERAL W WO CONTRAST INC CAD  Result Date: 10/22/2019 CLINICAL DATA:  Recently diagnosed left breast invasive ductal carcinoma, here for evaluation of extent of disease. Two site left breast biopsy of masses at 1 o'clock 6 cm from the nipple marked with a ribbon clip and at 1:30 o'clock 6 cm from the nipple marked with a coil clip demonstrated invasive ductal carcinoma at both sites. Biopsy of a left axillary lymph node did not demonstrate any evidence of metastatic disease. LABS:  eGFR 50 mL/min on 10/22/2019 EXAM: BILATERAL BREAST MRI WITH AND WITHOUT CONTRAST TECHNIQUE: Multiplanar, multisequence MR images of both breasts were obtained prior to and following the intravenous administration of 8 ml of Gadavist Three-dimensional MR images were rendered by post-processing of the original MR data on an independent workstation. The three-dimensional MR images were interpreted, and findings are reported in the following complete MRI report for this study. Three dimensional images were evaluated at the independent interpreting workstation using the DynaCAD thin client. COMPARISON:  Previous exam(s). FINDINGS: Breast composition: b. Scattered fibroglandular  tissue. Background parenchymal enhancement: Mild Right breast: No mass or abnormal enhancement. A central venous port catheter is located in the upper right chest and partially imaged. Left breast: An irregular enhancing mass in the upper outer left breast at middle depth measures 1.8 x 1.5 (series 7, image 51) x 2.2 cm (sagittal reconstruction) and is consistent with the patient's biopsy proven malignancy. Magnetic susceptibility within the mass corresponds to the ribbon shaped marking clip. An irregular enhancing mass in the upper outer left breast at middle to anterior depth is located lateral and inferior to this mass, measuring 0.9 x 0.8 (series 7, image 55) x 1.0 cm (sagittal  reconstruction), and is consistent with the patient's biopsy proven malignancy. Magnetic susceptibility within the mass corresponds to the coil shaped marking clip. Together, these two masses representing the patient's known malignancy span 3.3 cm in greatest dimension on an oblique reformat plane. There is surrounding heterogeneous non mass enhancement primarily located superior to these masses which in total spans approximately 2.4 cm TV (series 7, image 45) x 4.5 cm CC x 5.4 cm AP (sagittal reconstruction). This is suspicious for malignancy. Lymph nodes: No abnormal appearing lymph nodes, however both axilla are incompletely imaged on this exam. Ancillary findings:  None. IMPRESSION: 1. Two irregular masses in the upper outer left breast representing the patient's known malignancy. Surrounding heterogeneous non mass enhancement primarily located superior to these masses spans 5.4 cm in greatest dimension and is suspicious for malignancy. 2.  No evidence of malignancy in the right breast. RECOMMENDATION: Recommend continued surgical/oncologic management. If the patient is a candidate for breast conserving surgery, MR guided biopsy could be performed of the non mass enhancement located superior to the two sites of biopsy proven  malignancy to confirm extent of disease. BI-RADS CATEGORY  4: Suspicious. Electronically Signed   By: Zerita Boers M.D.   On: 10/22/2019 16:34   MM CLIP PLACEMENT LEFT  Result Date: 11/01/2019 CLINICAL DATA:  Evaluate CYLINDER biopsy clip placement following MR guided LEFT breast biopsy. EXAM: DIAGNOSTIC LEFT MAMMOGRAM POST ULTRASOUND BIOPSY COMPARISON:  Previous exam(s). FINDINGS: Mammographic images were obtained following MR guided biopsy of the anterosuperior aspect of non masslike enhancement within the UPPER-OUTER LEFT breast. The CYLINDER biopsy marking clip is in expected position at the site of biopsy and located 5 cm from the COIL biopsy marking clip (prior biopsy-proven malignancy). IMPRESSION: Appropriate positioning of the CYLINDER shaped biopsy marking clip at the site of biopsy in the UPPER OUTER LEFT breast. Final Assessment: Post Procedure Mammograms for Marker Placement Electronically Signed   By: Margarette Canada M.D.   On: 11/01/2019 11:02   MR LT BREAST BX W LOC DEV 1ST LESION IMAGE BX SPEC MR GUIDE  Addendum Date: 11/04/2019   ADDENDUM REPORT: 11/04/2019 15:27 ADDENDUM: Pathology revealed BENIGN BREAST TISSUE of the LEFT breast. This was found to be DISCORDANT by Dr. Hassan Rowan with possible sampling error as the non masslike enhancement identified on MR is suspicious. The suspicious MR enhancement extending anteriorly and superiorly from the biopsy-proven malignancies should be considered malignant until proven otherwise. Recommend repeat MR biopsy if breast conservation is to be pursued. Pathology results were discussed with the patient by telephone. The patient reported doing well after the biopsy with tenderness at the site. Post biopsy instructions and care were reviewed and questions were answered. The patient was encouraged to call [facility] for any additional concerns. The patient has a recent diagnosis of left breast cancer and should follow her  outlined treatment plan. Pathology results reported by Stacie Acres RN on 11/04/2019. Electronically Signed   By: Margarette Canada M.D.   On: 11/04/2019 15:27   Result Date: 11/04/2019 CLINICAL DATA:  69 year old female with 2 areas of newly diagnosed breast cancer in the UPPER-OUTER LEFT breast. For tissue sampling of anterosuperior extension of non masslike enhancement within the UPPER OUTER LEFT breast. EXAM: MRI GUIDED CORE NEEDLE BIOPSY OF THE LEFT BREAST TECHNIQUE: Multiplanar, multisequence MR imaging of the LEFT breast was performed both before and after administration of intravenous contrast. CONTRAST:  43mL GADAVIST GADOBUTROL 1 MMOL/ML IV SOLN COMPARISON:  Previous exams. FINDINGS:  I met with the patient, and we discussed the procedure of MRI guided biopsy, including risks, benefits, and alternatives. Specifically, we discussed the risks of infection, bleeding, tissue injury, clip migration, and inadequate sampling. Informed, written consent was given. The usual time out protocol was performed immediately prior to the procedure. Using sterile technique, 1% Lidocaine, MRI guidance, and a 9 gauge vacuum assisted device, biopsy was performed of the anterosuperior aspect of the non masslike enhancement within the UPPER-OUTER LEFT breast using a LATERAL approach. At the conclusion of the procedure, a CYLINDER tissue marker clip was deployed into the biopsy cavity. Follow-up 2-view mammogram was performed and dictated separately. IMPRESSION: MRI guided biopsy of anterosuperior aspect of non masslike enhancement within the UPPER-OUTER LEFT breast. No apparent complications. Electronically Signed: By: Margarette Canada M.D. On: 11/01/2019 10:27     ELIGIBLE FOR AVAILABLE RESEARCH PROTOCOL: AET  ASSESSMENT: 69 y.o. [place] woman status post left breast upper outer quadrant biopsy 09/27/2019 for a clinically T1-T2 N0, stage IA- IIA invasive ductal carcinoma, grade 3, triple negative, with an MIB-1 of 90%.  (1)  genetics testing results pending  (2) neoadjuvant chemotherapy will consist of doxorubicin and cyclophosphamide in dose dense fashion x4 starting 10/22/2019 to be followed by weekly carboplatin and paclitaxel x12  (a) echo 10/17/2019 shows an ejection fraction in the 60-65% range  (3) definitive surgery to follow  (4) adjuvant radiation as appropriate   PLAN: [place] continues to do quite well with her very intense treatment and will proceed to cycle #3 today.  She will receive her immune booster shot in 2days, and then return to see me in 2 weeks for her final AC dose  She knows to call for any other issue that may develop before the next visit  Total encounter time 25 minutes.Sarajane Jews C. Alisen Marsiglia, MD 11/19/2019 11:23 AM Medical Oncology and Hematology [facility] [place], [place] [address] Tel. [phone]    Fax. [phone]   This document serves as a record of services personally performed by Lurline Del, MD. It was created on his behalf by Wilburn Mylar, a trained medical scribe. The creation of this record is based on the scribe's personal observations and the provider's statements to them.   I, Lurline Del MD, have reviewed the above documentation for accuracy and completeness, and I agree with the above.   *Total Encounter Time as defined by the Centers for Medicare and Medicaid Services includes, in addition to the face-to-face time of a patient visit (documented in the note above) non-face-to-face time: obtaining and reviewing outside history, ordering and reviewing medications, tests or procedures, care coordination (communications with other health care professionals or caregivers) and documentation in the medical record.

## 2019-11-19 ENCOUNTER — Inpatient Hospital Stay: Payer: Medicare HMO

## 2019-11-19 ENCOUNTER — Inpatient Hospital Stay: Payer: Medicare HMO | Admitting: Oncology

## 2019-11-19 ENCOUNTER — Inpatient Hospital Stay: Payer: Medicare HMO | Attending: Oncology

## 2019-11-19 ENCOUNTER — Other Ambulatory Visit: Payer: Self-pay

## 2019-11-19 VITALS — BP 130/63 | HR 64 | Temp 98.2°F | Resp 18

## 2019-11-19 DIAGNOSIS — Z171 Estrogen receptor negative status [ER-]: Secondary | ICD-10-CM | POA: Insufficient documentation

## 2019-11-19 DIAGNOSIS — R011 Cardiac murmur, unspecified: Secondary | ICD-10-CM | POA: Diagnosis not present

## 2019-11-19 DIAGNOSIS — Z79899 Other long term (current) drug therapy: Secondary | ICD-10-CM | POA: Diagnosis not present

## 2019-11-19 DIAGNOSIS — E669 Obesity, unspecified: Secondary | ICD-10-CM | POA: Diagnosis not present

## 2019-11-19 DIAGNOSIS — Z95828 Presence of other vascular implants and grafts: Secondary | ICD-10-CM

## 2019-11-19 DIAGNOSIS — C50412 Malignant neoplasm of upper-outer quadrant of left female breast: Secondary | ICD-10-CM

## 2019-11-19 DIAGNOSIS — G4730 Sleep apnea, unspecified: Secondary | ICD-10-CM | POA: Diagnosis not present

## 2019-11-19 DIAGNOSIS — R5383 Other fatigue: Secondary | ICD-10-CM | POA: Insufficient documentation

## 2019-11-19 DIAGNOSIS — Z791 Long term (current) use of non-steroidal anti-inflammatories (NSAID): Secondary | ICD-10-CM | POA: Insufficient documentation

## 2019-11-19 DIAGNOSIS — Z5111 Encounter for antineoplastic chemotherapy: Secondary | ICD-10-CM | POA: Insufficient documentation

## 2019-11-19 DIAGNOSIS — Z5189 Encounter for other specified aftercare: Secondary | ICD-10-CM | POA: Insufficient documentation

## 2019-11-19 LAB — CBC WITH DIFFERENTIAL (CANCER CENTER ONLY)
Abs Immature Granulocytes: 1.52 10*3/uL — ABNORMAL HIGH (ref 0.00–0.07)
Basophils Absolute: 0.1 10*3/uL (ref 0.0–0.1)
Basophils Relative: 1 %
Eosinophils Absolute: 0 10*3/uL (ref 0.0–0.5)
Eosinophils Relative: 0 %
HCT: 33.5 % — ABNORMAL LOW (ref 36.0–46.0)
Hemoglobin: 11.1 g/dL — ABNORMAL LOW (ref 12.0–15.0)
Immature Granulocytes: 11 %
Lymphocytes Relative: 8 %
Lymphs Abs: 1.1 10*3/uL (ref 0.7–4.0)
MCH: 31 pg (ref 26.0–34.0)
MCHC: 33.1 g/dL (ref 30.0–36.0)
MCV: 93.6 fL (ref 80.0–100.0)
Monocytes Absolute: 0.8 10*3/uL (ref 0.1–1.0)
Monocytes Relative: 6 %
Neutro Abs: 9.8 10*3/uL — ABNORMAL HIGH (ref 1.7–7.7)
Neutrophils Relative %: 74 %
Platelet Count: 123 10*3/uL — ABNORMAL LOW (ref 150–400)
RBC: 3.58 MIL/uL — ABNORMAL LOW (ref 3.87–5.11)
RDW: 12.8 % (ref 11.5–15.5)
WBC Count: 13.3 10*3/uL — ABNORMAL HIGH (ref 4.0–10.5)
nRBC: 0 % (ref 0.0–0.2)

## 2019-11-19 LAB — CMP (CANCER CENTER ONLY)
ALT: 9 U/L (ref 0–44)
AST: 13 U/L — ABNORMAL LOW (ref 15–41)
Albumin: 3.5 g/dL (ref 3.5–5.0)
Alkaline Phosphatase: 98 U/L (ref 38–126)
Anion gap: 4 — ABNORMAL LOW (ref 5–15)
BUN: 17 mg/dL (ref 8–23)
CO2: 28 mmol/L (ref 22–32)
Calcium: 9.2 mg/dL (ref 8.9–10.3)
Chloride: 108 mmol/L (ref 98–111)
Creatinine: 0.96 mg/dL (ref 0.44–1.00)
GFR, Estimated: >60 mL/min (ref >60)
Glucose, Bld: 98 mg/dL (ref 70–99)
Potassium: 4 mmol/L (ref 3.5–5.1)
Sodium: 140 mmol/L (ref 135–145)
Total Bilirubin: 0.3 mg/dL (ref 0.3–1.2)
Total Protein: 6.5 g/dL (ref 6.5–8.1)

## 2019-11-19 MED ORDER — SODIUM CHLORIDE 0.9% FLUSH
10.0000 mL | INTRAVENOUS | Status: DC | PRN
  Administered 2019-11-19: 10 mL
  Filled 2019-11-19: qty 10

## 2019-11-19 MED ORDER — DOXORUBICIN HCL CHEMO IV INJECTION 2 MG/ML
60.0000 mg/m2 | Freq: Once | INTRAVENOUS | Status: AC
  Administered 2019-11-19: 116 mg via INTRAVENOUS
  Filled 2019-11-19: qty 58

## 2019-11-19 MED ORDER — SODIUM CHLORIDE 0.9 % IV SOLN
Freq: Once | INTRAVENOUS | Status: AC
  Filled 2019-11-19: qty 250

## 2019-11-19 MED ORDER — PALONOSETRON HCL INJECTION 0.25 MG/5ML
0.2500 mg | Freq: Once | INTRAVENOUS | Status: AC
  Administered 2019-11-19: 0.25 mg via INTRAVENOUS

## 2019-11-19 MED ORDER — DEXAMETHASONE 4 MG PO TABS: ORAL_TABLET | ORAL | 1 refills | Status: DC

## 2019-11-19 MED ORDER — SODIUM CHLORIDE 0.9% FLUSH
10.0000 mL | Freq: Once | INTRAVENOUS | Status: AC
  Administered 2019-11-19: 10 mL
  Filled 2019-11-19: qty 10

## 2019-11-19 MED ORDER — PALONOSETRON HCL INJECTION 0.25 MG/5ML
INTRAVENOUS | Status: AC
  Filled 2019-11-19: qty 5

## 2019-11-19 MED ORDER — SODIUM CHLORIDE 0.9 % IV SOLN
150.0000 mg | Freq: Once | INTRAVENOUS | Status: AC
  Administered 2019-11-19: 150 mg via INTRAVENOUS
  Filled 2019-11-19: qty 150

## 2019-11-19 MED ORDER — HEPARIN SOD (PORK) LOCK FLUSH 100 UNIT/ML IV SOLN
500.0000 [IU] | Freq: Once | INTRAVENOUS | Status: AC | PRN
  Administered 2019-11-19: 500 [IU]
  Filled 2019-11-19: qty 5

## 2019-11-19 MED ORDER — SODIUM CHLORIDE 0.9 % IV SOLN
600.0000 mg/m2 | Freq: Once | INTRAVENOUS | Status: AC
  Administered 2019-11-19: 1160 mg via INTRAVENOUS
  Filled 2019-11-19: qty 58

## 2019-11-19 MED ORDER — SODIUM CHLORIDE 0.9 % IV SOLN
10.0000 mg | Freq: Once | INTRAVENOUS | Status: AC
  Administered 2019-11-19: 10 mg via INTRAVENOUS
  Filled 2019-11-19: qty 10

## 2019-11-19 NOTE — Patient Instructions (Signed)
[facility] Discharge Instructions for Patients Receiving Chemotherapy  Today you received the following chemotherapy agents Doxorubicin (ADRIAMYCIN) & Cyclophosphamide (CYTOXAN).  To help prevent nausea and vomiting after your treatment, we encourage you to take your nausea medication as prescribed.   If you develop nausea and vomiting that is not controlled by your nausea medication, call the clinic.   BELOW ARE SYMPTOMS THAT SHOULD BE REPORTED IMMEDIATELY:  *FEVER GREATER THAN 100.5 F  *CHILLS WITH OR WITHOUT FEVER  NAUSEA AND VOMITING THAT IS NOT CONTROLLED WITH YOUR NAUSEA MEDICATION  *UNUSUAL SHORTNESS OF BREATH  *UNUSUAL BRUISING OR BLEEDING  TENDERNESS IN MOUTH AND THROAT WITH OR WITHOUT PRESENCE OF ULCERS  *URINARY PROBLEMS  *BOWEL PROBLEMS  UNUSUAL RASH Items with * indicate a potential emergency and should be followed up as soon as possible.  Feel free to call the clinic should you have any questions or concerns. The clinic phone number is [phone].  Please show the CHEMO ALERT CARD at check-in to the Emergency Department and triage nurse.   

## 2019-11-21 ENCOUNTER — Other Ambulatory Visit: Payer: Self-pay

## 2019-11-21 ENCOUNTER — Telehealth: Payer: Self-pay | Admitting: Oncology

## 2019-11-21 ENCOUNTER — Inpatient Hospital Stay: Payer: Medicare HMO

## 2019-11-21 ENCOUNTER — Other Ambulatory Visit: Payer: Self-pay | Admitting: *Deleted

## 2019-11-21 DIAGNOSIS — C50412 Malignant neoplasm of upper-outer quadrant of left female breast: Secondary | ICD-10-CM | POA: Diagnosis not present

## 2019-11-21 DIAGNOSIS — Z171 Estrogen receptor negative status [ER-]: Secondary | ICD-10-CM

## 2019-11-21 DIAGNOSIS — R5383 Other fatigue: Secondary | ICD-10-CM | POA: Diagnosis not present

## 2019-11-21 DIAGNOSIS — G4730 Sleep apnea, unspecified: Secondary | ICD-10-CM | POA: Diagnosis not present

## 2019-11-21 DIAGNOSIS — E669 Obesity, unspecified: Secondary | ICD-10-CM | POA: Diagnosis not present

## 2019-11-21 DIAGNOSIS — Z5189 Encounter for other specified aftercare: Secondary | ICD-10-CM | POA: Diagnosis not present

## 2019-11-21 DIAGNOSIS — Z5111 Encounter for antineoplastic chemotherapy: Secondary | ICD-10-CM | POA: Diagnosis not present

## 2019-11-21 DIAGNOSIS — Z79899 Other long term (current) drug therapy: Secondary | ICD-10-CM | POA: Diagnosis not present

## 2019-11-21 DIAGNOSIS — R011 Cardiac murmur, unspecified: Secondary | ICD-10-CM | POA: Diagnosis not present

## 2019-11-21 MED ORDER — PEGFILGRASTIM-JMDB 6 MG/0.6ML ~~LOC~~ SOSY
PREFILLED_SYRINGE | SUBCUTANEOUS | Status: AC
  Filled 2019-11-21: qty 0.6

## 2019-11-21 MED ORDER — PEGFILGRASTIM-JMDB 6 MG/0.6ML ~~LOC~~ SOSY
6.0000 mg | PREFILLED_SYRINGE | Freq: Once | SUBCUTANEOUS | Status: AC
  Administered 2019-11-21: 6 mg via SUBCUTANEOUS

## 2019-11-21 MED ORDER — DEXAMETHASONE 4 MG PO TABS: ORAL_TABLET | ORAL | 1 refills | Status: DC

## 2019-11-21 NOTE — Telephone Encounter (Signed)
No 11/9 los, no changes made to pt schedule  

## 2019-11-21 NOTE — Patient Instructions (Signed)

## 2019-12-02 NOTE — Progress Notes (Signed)
[facility] [facility]  Telephone:([phone] Fax:(336) [phone]     ID: Cheyenne Mcdonald DOB: 05/02/1950  MR#: [id number]  TKW#:[id number]  Patient Care Team: Benito Mccreedy, MD as PCP - General (Internal Medicine) Servando Salina, MD as Consulting Physician (Obstetrics and Gynecology) Mauro Kaufmann, RN as Oncology Nurse Navigator Rockwell Germany, RN as Oncology Nurse Navigator [name], MD as Consulting Physician (General Surgery) Maeva Dant, Virgie Dad, MD as Consulting Physician (Oncology) Gery Pray, MD as Consulting Physician (Radiation Oncology) Dimas Aguas, MD as Consulting Physician (Nephrology) Chauncey Cruel, MD OTHER MD:  CHIEF COMPLAINT: triple negative breast cancer  CURRENT TREATMENT: Neoadjuvant chemotherapy   INTERVAL HISTORY: Heleena returns today for follow up of her triple negative breast cancer.  She is accompanied by her husband  She continues on neoadjuvant chemotherapy, consisting of doxorubicin and cyclophosphamide in dose dense fashion x4, on 10/22/2019. Today is day 1 cycle 4, her final cycle.  Her most recent echocardiogram from 10/17/2019 showed an ejection fraction of 60-65%.   REVIEW OF SYSTEMS: Jaree has tolerated treatment remarkably well.  She has not required any dose reductions or delays.  She does feel a little bit fatigued but still walks up to 1 hour most days.  She is also doing some changes in her house, which she had been planning to do for some time.  Detailed review of systems is otherwise stable   COVID 19 VACCINATION STATUS: Status post Pfizer x2 with booster October 2021   HISTORY OF CURRENT ILLNESS: From the original intake note:  Cheyenne Mcdonald presented for her routine mammography with a palpable left breast lump. She underwent bilateral diagnostic mammography with tomography and left breast ultrasonography at [facility] on 09/13/2019 showing: breast density category B; two adjacent masses in  left breast at 1 o'clock, spanning approximately 3.3 cm; one indeterminate left axillary lymph node with 0.4 cm cortical bulge; no evidence right breast malignancy.  Accordingly on 09/27/2019 she proceeded to biopsy of the left breast areas in question. The pathology from this procedure ([id number]) showed: invasive ductal carcinoma, grade 3, present in both of the adjacent masses. Prognostic indicators significant for: estrogen receptor, 0% negative and progesterone receptor, 0% negative. Proliferation marker Ki67 at 90%. HER2 equivocal by immunohistochemistry (2+), but negative by fluorescent in situ hybridization with a signals ratio 2.2 and number per cell 3.3. Additional analysis of more cells showed a signals ratio 2.05 and number per cell 3.13.  The biopsied lymph node was negative for carcinoma.  This was felt to be concordant  The patient's subsequent history is as detailed below.   PAST MEDICAL HISTORY: Past Medical History:  Diagnosis Date   Breast cancer ([place])    Obesity    Sleep apnea   heart murmur (since childhood)   PAST SURGICAL HISTORY: Past Surgical History:  Procedure Laterality Date   ABDOMINAL HYSTERECTOMY     IR IMAGING GUIDED PORT INSERTION  10/18/2019    FAMILY HISTORY: Family History  Problem Relation Age of Onset   Breast cancer Cousin 49       maternal cousin; bilateral     Her father died at age 4, cause of death unknown. Her mother is age 7 as of 09/2019. Cheyenne Mcdonald has two brothers (and no sisters). She reports one cousin with breast cancer at age 71.   GYNECOLOGIC HISTORY:  No LMP recorded. Patient has had a hysterectomy. Menarche: 69 years old Age at first live birth: 69 years old Cheyenne Mcdonald 2 LMP  1990 Contraceptive: used for maybe 20 years HRT: never used  Hysterectomy? Yes, 1990 BSO? 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 Husband Cheyenne Mcdonald is retired Nature conservation officer and then retired Civil Service fast streamer.  At home  is just the 2 of them. Daughter Cheyenne Mcdonald, age 63, works in [place] entry in [employer]. Son Cheyenne Mcdonald, age 5, has a college degree but works for [employer] [place] [employer]. Cheyenne Mcdonald has four grandchildren. She attends [employer].    ADVANCED DIRECTIVES: In place   HEALTH MAINTENANCE: Social History   Tobacco Use   Smoking status: Never Smoker   Smokeless tobacco: Never Used  Substance Use Topics   Alcohol use: Never   Drug use: Never     Colonoscopy: 2018 (Dr. Collene Mares)  PAP: approx. 2013  Bone density: 2016, "normal"   Allergies  Allergen Reactions   Other Itching   Shellfish Allergy Itching    Current Outpatient Medications  Medication Sig Dispense Refill   Cholecalciferol 125 MCG (5000 UT) capsule Take by mouth.     dexamethasone (DECADRON) 4 MG tablet Take 2 tablets (8 mg) by mouth twice daily days 2 and 3 after chemo, then take one last dose the morning of day 4; take with food 30 tablet 1   diclofenac sodium (VOLTAREN) 1 % GEL Apply 1 application topically 4 (four) times daily.       lidocaine-prilocaine (EMLA) cream Apply to affected area once 30 g 3   loratadine (CLARITIN) 10 MG tablet Take 1 tablet (10 mg total) by mouth daily. 40 tablet 0   LORazepam (ATIVAN) 0.5 MG tablet Take 1 tablet (0.5 mg total) by mouth at bedtime as needed (Nausea or vomiting). 20 tablet 0   meloxicam (MOBIC) 15 MG tablet Take 15 mg by mouth daily. With food      Naltrexone-buPROPion HCl ER (CONTRAVE) 8-90 MG TB12      prochlorperazine (COMPAZINE) 10 MG tablet Take 1 tablet (10 mg total) by mouth every 6 (six) hours as needed (Nausea or vomiting). 30 tablet 1   No current facility-administered medications for this visit.    OBJECTIVE: African-American woman who appears well  Vitals:   12/03/19 1053  BP: (!) 114/56  Pulse: 72  Resp: 18  Temp: 97.6 F (36.4 C)  SpO2: 100%     Body mass index is 33.41 kg/m.   Wt Readings from Last 3 Encounters:   12/03/19 191 lb 9.6 oz (86.9 kg)  11/19/19 188 lb 4.8 oz (85.4 kg)  11/05/19 190 lb 3.2 oz (86.3 kg)      ECOG FS:1 - Symptomatic but completely ambulatory  Sclerae unicteric, EOMs intact Wearing a mask No cervical or supraclavicular adenopathy Lungs no rales or rhonchi Heart regular rate and rhythm Abd soft, nontender, positive bowel sounds MSK no focal spinal tenderness, no upper extremity lymphedema Neuro: nonfocal, well oriented, appropriate affect Breasts: I do not palpate a mass in the left breast at this time.  There are no skin or nipple changes of concern.   LAB RESULTS:  CMP     Component Value Date/Time   NA 142 12/03/2019 1040   K 3.6 12/03/2019 1040   CL 107 12/03/2019 1040   CO2 27 12/03/2019 1040   GLUCOSE 94 12/03/2019 1040   BUN 14 12/03/2019 1040   CREATININE 0.88 12/03/2019 1040   CALCIUM 8.8 (L) 12/03/2019 1040   PROT 6.0 (L) 12/03/2019 1040   ALBUMIN 3.4 (L)  12/03/2019 1040   AST 13 (L) 12/03/2019 1040   ALT 9 12/03/2019 1040   ALKPHOS 101 12/03/2019 1040   BILITOT 0.2 (L) 12/03/2019 1040   GFRNONAA >60 12/03/2019 1040   GFRAA 58 (L) 10/09/2019 0806    No results found for: TOTALPROTELP, ALBUMINELP, A1GS, A2GS, BETS, BETA2SER, GAMS, MSPIKE, SPEI  Lab Results  Component Value Date   WBC 16.5 (H) 12/03/2019   NEUTROABS PENDING 12/03/2019   HGB 10.2 (L) 12/03/2019   HCT 30.8 (L) 12/03/2019   MCV 95.4 12/03/2019   PLT 128 (L) 12/03/2019    No results found for: LABCA2  No components found for: [id number]  No results for input(s): INR in the last 168 hours.  No results found for: LABCA2  No results found for: WYO378  No results found for: HYI502  No results found for: DXA128  No results found for: CA2729  No components found for: HGQUANT  No results found for: CEA1 / No results found for: CEA1   No results found for: AFPTUMOR  No results found for: CHROMOGRNA  No results found for: KPAFRELGTCHN, LAMBDASER,  KAPLAMBRATIO (kappa/lambda light chains)  No results found for: HGBA, HGBA2QUANT, HGBFQUANT, HGBSQUAN (Hemoglobinopathy evaluation)   No results found for: LDH  No results found for: IRON, TIBC, IRONPCTSAT (Iron and TIBC)  No results found for: FERRITIN  Urinalysis No results found for: COLORURINE, APPEARANCEUR, LABSPEC, PHURINE, GLUCOSEU, HGBUR, BILIRUBINUR, KETONESUR, PROTEINUR, UROBILINOGEN, NITRITE, LEUKOCYTESUR   STUDIES: No results found.   ELIGIBLE FOR AVAILABLE RESEARCH PROTOCOL: AET  ASSESSMENT: 69 y.o. [place] woman status post left breast upper outer quadrant biopsy 09/27/2019 for a clinically T1-T2 N0, stage IA- IIA invasive ductal carcinoma, grade 3, triple negative, with an MIB-1 of 90%.  (1) genetics testing results pending  (2) neoadjuvant chemotherapy will consist of doxorubicin and cyclophosphamide in dose dense fashion x4 starting 10/22/2019 to be followed by weekly carboplatin and paclitaxel x12  (a) echo 10/17/2019 shows an ejection fraction in the 60-65% range  (3) definitive surgery to follow  (4) adjuvant radiation as appropriate   PLAN: Christyn will complete the first part of her chemotherapy today.  She has done remarkably well.  I do not palpate a mass in the left breast at present which gives me hope when we get the final pathology it will be very favorable.  Today we did discuss the upcoming treatment beginning 12/17/2019 with paclitaxel weekly and carboplatin also weekly at an AUC of 2.  I have corrected the orders appropriately.  Zena understands it is okay if she wants to take a week off for example for Christmas but at this point she is very motivated to just get on with it and get it completed  She knows to call for any other issue that may develop before the next visit  Total encounter time 25 minutes.Sarajane Jews C. Lourie Retz, MD 12/03/2019 11:15 AM Medical Oncology and Hematology [facility] [place], [place] [address] Tel. [phone]    Fax. [phone]   This document serves as a record of services personally performed by Lurline Del, MD. It was created on his behalf by Wilburn Mylar, a trained medical scribe. The creation of this record is based on the scribe's personal observations and the provider's statements to them.   I, Lurline Del MD, have reviewed the above documentation for accuracy and completeness, and I agree with the above.   *Total Encounter Time as defined by the Centers for  Medicare and Medicaid Services includes, in addition to the face-to-face time of a patient visit (documented in the note above) non-face-to-face time: obtaining and reviewing outside history, ordering and reviewing medications, tests or procedures, care coordination (communications with other health care professionals or caregivers) and documentation in the medical record.

## 2019-12-03 ENCOUNTER — Inpatient Hospital Stay: Payer: Medicare HMO

## 2019-12-03 ENCOUNTER — Encounter: Payer: Self-pay | Admitting: *Deleted

## 2019-12-03 ENCOUNTER — Telehealth: Payer: Self-pay | Admitting: Oncology

## 2019-12-03 ENCOUNTER — Inpatient Hospital Stay: Payer: Medicare HMO | Admitting: Oncology

## 2019-12-03 ENCOUNTER — Other Ambulatory Visit: Payer: Self-pay

## 2019-12-03 VITALS — BP 114/56 | HR 72 | Temp 97.6°F | Resp 18 | Ht 63.5 in | Wt 191.6 lb

## 2019-12-03 DIAGNOSIS — Z95828 Presence of other vascular implants and grafts: Secondary | ICD-10-CM

## 2019-12-03 DIAGNOSIS — R011 Cardiac murmur, unspecified: Secondary | ICD-10-CM | POA: Diagnosis not present

## 2019-12-03 DIAGNOSIS — Z5111 Encounter for antineoplastic chemotherapy: Secondary | ICD-10-CM | POA: Diagnosis not present

## 2019-12-03 DIAGNOSIS — Z171 Estrogen receptor negative status [ER-]: Secondary | ICD-10-CM

## 2019-12-03 DIAGNOSIS — C50412 Malignant neoplasm of upper-outer quadrant of left female breast: Secondary | ICD-10-CM

## 2019-12-03 DIAGNOSIS — Z79899 Other long term (current) drug therapy: Secondary | ICD-10-CM | POA: Diagnosis not present

## 2019-12-03 DIAGNOSIS — G4730 Sleep apnea, unspecified: Secondary | ICD-10-CM | POA: Diagnosis not present

## 2019-12-03 DIAGNOSIS — Z5189 Encounter for other specified aftercare: Secondary | ICD-10-CM | POA: Diagnosis not present

## 2019-12-03 DIAGNOSIS — E669 Obesity, unspecified: Secondary | ICD-10-CM | POA: Diagnosis not present

## 2019-12-03 DIAGNOSIS — R5383 Other fatigue: Secondary | ICD-10-CM | POA: Diagnosis not present

## 2019-12-03 LAB — CBC WITH DIFFERENTIAL (CANCER CENTER ONLY)
Abs Immature Granulocytes: 2.65 10*3/uL — ABNORMAL HIGH (ref 0.00–0.07)
Basophils Absolute: 0.1 10*3/uL (ref 0.0–0.1)
Basophils Relative: 1 %
Eosinophils Absolute: 0 10*3/uL (ref 0.0–0.5)
Eosinophils Relative: 0 %
HCT: 30.8 % — ABNORMAL LOW (ref 36.0–46.0)
Hemoglobin: 10.2 g/dL — ABNORMAL LOW (ref 12.0–15.0)
Immature Granulocytes: 16 %
Lymphocytes Relative: 6 %
Lymphs Abs: 0.9 10*3/uL (ref 0.7–4.0)
MCH: 31.6 pg (ref 26.0–34.0)
MCHC: 33.1 g/dL (ref 30.0–36.0)
MCV: 95.4 fL (ref 80.0–100.0)
Monocytes Absolute: 0.9 10*3/uL (ref 0.1–1.0)
Monocytes Relative: 5 %
Neutro Abs: 11.9 10*3/uL — ABNORMAL HIGH (ref 1.7–7.7)
Neutrophils Relative %: 72 %
Platelet Count: 128 10*3/uL — ABNORMAL LOW (ref 150–400)
RBC: 3.23 MIL/uL — ABNORMAL LOW (ref 3.87–5.11)
RDW: 14 % (ref 11.5–15.5)
WBC Count: 16.5 10*3/uL — ABNORMAL HIGH (ref 4.0–10.5)
nRBC: 0.2 % (ref 0.0–0.2)

## 2019-12-03 LAB — CMP (CANCER CENTER ONLY)
ALT: 9 U/L (ref 0–44)
AST: 13 U/L — ABNORMAL LOW (ref 15–41)
Albumin: 3.4 g/dL — ABNORMAL LOW (ref 3.5–5.0)
Alkaline Phosphatase: 101 U/L (ref 38–126)
Anion gap: 8 (ref 5–15)
BUN: 14 mg/dL (ref 8–23)
CO2: 27 mmol/L (ref 22–32)
Calcium: 8.8 mg/dL — ABNORMAL LOW (ref 8.9–10.3)
Chloride: 107 mmol/L (ref 98–111)
Creatinine: 0.88 mg/dL (ref 0.44–1.00)
GFR, Estimated: >60 mL/min (ref >60)
Glucose, Bld: 94 mg/dL (ref 70–99)
Potassium: 3.6 mmol/L (ref 3.5–5.1)
Sodium: 142 mmol/L (ref 135–145)
Total Bilirubin: 0.2 mg/dL — ABNORMAL LOW (ref 0.3–1.2)
Total Protein: 6 g/dL — ABNORMAL LOW (ref 6.5–8.1)

## 2019-12-03 MED ORDER — SODIUM CHLORIDE 0.9 % IV SOLN
600.0000 mg/m2 | Freq: Once | INTRAVENOUS | Status: AC
  Administered 2019-12-03: 1160 mg via INTRAVENOUS
  Filled 2019-12-03: qty 58

## 2019-12-03 MED ORDER — SODIUM CHLORIDE 0.9 % IV SOLN
Freq: Once | INTRAVENOUS | Status: AC
  Filled 2019-12-03: qty 250

## 2019-12-03 MED ORDER — PALONOSETRON HCL INJECTION 0.25 MG/5ML
0.2500 mg | Freq: Once | INTRAVENOUS | Status: AC
  Administered 2019-12-03: 0.25 mg via INTRAVENOUS

## 2019-12-03 MED ORDER — DOXORUBICIN HCL CHEMO IV INJECTION 2 MG/ML
60.0000 mg/m2 | Freq: Once | INTRAVENOUS | Status: AC
  Administered 2019-12-03: 116 mg via INTRAVENOUS
  Filled 2019-12-03: qty 58

## 2019-12-03 MED ORDER — SODIUM CHLORIDE 0.9% FLUSH
10.0000 mL | INTRAVENOUS | Status: DC | PRN
  Administered 2019-12-03: 10 mL
  Filled 2019-12-03: qty 10

## 2019-12-03 MED ORDER — ACETAMINOPHEN 325 MG PO TABS
ORAL_TABLET | ORAL | Status: AC
  Filled 2019-12-03: qty 2

## 2019-12-03 MED ORDER — SODIUM CHLORIDE 0.9 % IV SOLN
10.0000 mg | Freq: Once | INTRAVENOUS | Status: AC
  Administered 2019-12-03: 10 mg via INTRAVENOUS
  Filled 2019-12-03: qty 10

## 2019-12-03 MED ORDER — DIPHENHYDRAMINE HCL 25 MG PO CAPS
ORAL_CAPSULE | ORAL | Status: AC
  Filled 2019-12-03: qty 1

## 2019-12-03 MED ORDER — SODIUM CHLORIDE 0.9 % IV SOLN
150.0000 mg | Freq: Once | INTRAVENOUS | Status: AC
  Administered 2019-12-03: 150 mg via INTRAVENOUS
  Filled 2019-12-03: qty 150

## 2019-12-03 MED ORDER — PALONOSETRON HCL INJECTION 0.25 MG/5ML
INTRAVENOUS | Status: AC
  Filled 2019-12-03: qty 5

## 2019-12-03 MED ORDER — SODIUM CHLORIDE 0.9% FLUSH
10.0000 mL | Freq: Once | INTRAVENOUS | Status: AC
  Administered 2019-12-03: 10 mL
  Filled 2019-12-03: qty 10

## 2019-12-03 MED ORDER — HEPARIN SOD (PORK) LOCK FLUSH 100 UNIT/ML IV SOLN
500.0000 [IU] | Freq: Once | INTRAVENOUS | Status: AC | PRN
  Administered 2019-12-03: 500 [IU]
  Filled 2019-12-03: qty 5

## 2019-12-03 NOTE — Progress Notes (Signed)
Pt stable at time of discharge. 

## 2019-12-03 NOTE — Patient Instructions (Signed)
[facility] Discharge Instructions for Patients Receiving Chemotherapy  Today you received the following chemotherapy agents: doxorubicin and cyclophosphamide.  To help prevent nausea and vomiting after your treatment, we encourage you to take your nausea medication as directed.   If you develop nausea and vomiting that is not controlled by your nausea medication, call the clinic.   BELOW ARE SYMPTOMS THAT SHOULD BE REPORTED IMMEDIATELY:  *FEVER GREATER THAN 100.5 F  *CHILLS WITH OR WITHOUT FEVER  NAUSEA AND VOMITING THAT IS NOT CONTROLLED WITH YOUR NAUSEA MEDICATION  *UNUSUAL SHORTNESS OF BREATH  *UNUSUAL BRUISING OR BLEEDING  TENDERNESS IN MOUTH AND THROAT WITH OR WITHOUT PRESENCE OF ULCERS  *URINARY PROBLEMS  *BOWEL PROBLEMS  UNUSUAL RASH Items with * indicate a potential emergency and should be followed up as soon as possible.  Feel free to call the clinic should you have any questions or concerns. The clinic phone number is [phone].  Please show the CHEMO ALERT CARD at check-in to the Emergency Department and triage nurse.   

## 2019-12-03 NOTE — Telephone Encounter (Signed)
Scheduled appts per 11/23 los. Pt stated she would get updated appt calendar before she leaves treatment on 11/23.

## 2019-12-06 ENCOUNTER — Other Ambulatory Visit: Payer: Self-pay

## 2019-12-06 ENCOUNTER — Inpatient Hospital Stay: Payer: Medicare HMO

## 2019-12-06 VITALS — BP 121/62 | HR 76 | Temp 97.6°F | Resp 18

## 2019-12-06 DIAGNOSIS — Z5111 Encounter for antineoplastic chemotherapy: Secondary | ICD-10-CM | POA: Diagnosis not present

## 2019-12-06 DIAGNOSIS — G4730 Sleep apnea, unspecified: Secondary | ICD-10-CM | POA: Diagnosis not present

## 2019-12-06 DIAGNOSIS — Z171 Estrogen receptor negative status [ER-]: Secondary | ICD-10-CM | POA: Diagnosis not present

## 2019-12-06 DIAGNOSIS — R011 Cardiac murmur, unspecified: Secondary | ICD-10-CM | POA: Diagnosis not present

## 2019-12-06 DIAGNOSIS — R5383 Other fatigue: Secondary | ICD-10-CM | POA: Diagnosis not present

## 2019-12-06 DIAGNOSIS — E669 Obesity, unspecified: Secondary | ICD-10-CM | POA: Diagnosis not present

## 2019-12-06 DIAGNOSIS — C50412 Malignant neoplasm of upper-outer quadrant of left female breast: Secondary | ICD-10-CM | POA: Diagnosis not present

## 2019-12-06 DIAGNOSIS — Z5189 Encounter for other specified aftercare: Secondary | ICD-10-CM | POA: Diagnosis not present

## 2019-12-06 DIAGNOSIS — Z79899 Other long term (current) drug therapy: Secondary | ICD-10-CM | POA: Diagnosis not present

## 2019-12-06 MED ORDER — PEGFILGRASTIM-JMDB 6 MG/0.6ML ~~LOC~~ SOSY
PREFILLED_SYRINGE | SUBCUTANEOUS | Status: AC
  Filled 2019-12-06: qty 0.6

## 2019-12-06 MED ORDER — PEGFILGRASTIM-JMDB 6 MG/0.6ML ~~LOC~~ SOSY
6.0000 mg | PREFILLED_SYRINGE | Freq: Once | SUBCUTANEOUS | Status: AC
  Administered 2019-12-06: 6 mg via SUBCUTANEOUS

## 2019-12-06 NOTE — Patient Instructions (Signed)

## 2019-12-16 NOTE — Progress Notes (Signed)
[facility]  Telephone:(336) [phone] Fax:([phone]     ID: Cheyenne Mcdonald DOB: 1950-02-07  MR#: [id number]  CLE#:[id number]  Patient Care Team: Benito Mccreedy, MD as PCP - General (Internal Medicine) Servando Salina, MD as Consulting Physician (Obstetrics and Gynecology) Mauro Kaufmann, RN as Oncology Nurse Navigator Rockwell Germany, RN as Oncology Nurse Navigator [name], MD as Consulting Physician (General Surgery) Kaysie Michelini, Virgie Dad, MD as Consulting Physician (Oncology) Gery Pray, MD as Consulting Physician (Radiation Oncology) Dimas Aguas, MD as Consulting Physician (Nephrology) Chauncey Cruel, MD OTHER MD:  CHIEF COMPLAINT: triple negative breast cancer  CURRENT TREATMENT: Neoadjuvant chemotherapy   INTERVAL HISTORY: Cheyenne Mcdonald returns today for follow up of her triple negative breast cancer.  She is accompanied by her husband  She completed the first part of her neoadjuvant chemotherapy, consisting of doxorubicin and cyclophosphamide in dose dense fashion x4, at her last visit on 12/03/2019.  She required no dose reductions or delays.  She is now ready to begin weekly paclitaxel and carboplatin which will be repeated x12.  Her most recent echocardiogram from 10/17/2019 showed an ejection fraction of 60-65%.   REVIEW OF SYSTEMS: Cheyenne Mcdonald gave herself a little party after completing her AC treatments.  She had several friends over all of whom have been vaccinated and nobody was coughing she says.  She has gained a little bit of weight which concerns her.  She is not exercising as regularly as she would like.  She tells me that she has a little bit of tingling in her fingers in the morning.  This seems to disappear through the rest of the day.  She does not have any issues with her feet.  Aside from these issues a detailed review of systems was stable.   COVID 19 VACCINATION STATUS: Status post Pfizer x2 with booster October  2021   HISTORY OF CURRENT ILLNESS: From the original intake note:  Cheyenne Mcdonald presented for her routine mammography with a palpable left breast lump. She underwent bilateral diagnostic mammography with tomography and left breast ultrasonography at [facility] on 09/13/2019 showing: breast density category B; two adjacent masses in left breast at 1 o'clock, spanning approximately 3.3 cm; one indeterminate left axillary lymph node with 0.4 cm cortical bulge; no evidence right breast malignancy.  Accordingly on 09/27/2019 she proceeded to biopsy of the left breast areas in question. The pathology from this procedure ([id number]) showed: invasive ductal carcinoma, grade 3, present in both of the adjacent masses. Prognostic indicators significant for: estrogen receptor, 0% negative and progesterone receptor, 0% negative. Proliferation marker Ki67 at 90%. HER2 equivocal by immunohistochemistry (2+), but negative by fluorescent in situ hybridization with a signals ratio 2.2 and number per cell 3.3. Additional analysis of more cells showed a signals ratio 2.05 and number per cell 3.13.  The biopsied lymph node was negative for carcinoma.  This was felt to be concordant  The patient's subsequent history is as detailed below.   PAST MEDICAL HISTORY: Past Medical History:  Diagnosis Date  . Breast cancer ([place])   . Obesity   . Sleep apnea   heart murmur (since childhood)   PAST SURGICAL HISTORY: Past Surgical History:  Procedure Laterality Date  . ABDOMINAL HYSTERECTOMY    . IR IMAGING GUIDED PORT INSERTION  10/18/2019    FAMILY HISTORY: Family History  Problem Relation Age of Onset  . Breast cancer Cousin 69       maternal cousin; bilateral  Her father died at age 74, cause of death unknown. Her mother is age 73 as of 09/2019. Cheyenne Mcdonald has two brothers (and no sisters). She reports one cousin with breast cancer at age 68.   GYNECOLOGIC HISTORY:  No LMP recorded. Patient has had a  hysterectomy. Menarche: 69 years old Age at first live birth: 69 years old [place] P 2 LMP 1990 Contraceptive: used for maybe 20 years HRT: never used  Hysterectomy? Yes, 1990 BSO? 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 Husband Cheyenne Mcdonald is retired Nature conservation officer and then retired Civil Service fast streamer.  At home is just the 2 of them. Daughter Cheyenne Mcdonald, age 70, works in [place] entry in [employer]. Son Cheyenne Mcdonald, age 20, has a college degree but works for [employer] in [employer]. Cheyenne Mcdonald has four grandchildren. She attends [employer].    ADVANCED DIRECTIVES: In place   HEALTH MAINTENANCE: Social History   Tobacco Use  . Smoking status: Never Smoker  . Smokeless tobacco: Never Used  Substance Use Topics  . Alcohol use: Never  . Drug use: Never     Colonoscopy: 2018 (Dr. Collene Mares)  PAP: approx. 2013  Bone density: 2016, "normal"   Allergies  Allergen Reactions  . Other Itching  . Shellfish Allergy Itching    Current Outpatient Medications  Medication Sig Dispense Refill  . Cholecalciferol 125 MCG (5000 UT) capsule Take by mouth.    . lidocaine-prilocaine (EMLA) cream Apply to affected area once 30 g 3  . loratadine (CLARITIN) 10 MG tablet Take 1 tablet (10 mg total) by mouth daily. 40 tablet 0  . LORazepam (ATIVAN) 0.5 MG tablet Take 1 tablet (0.5 mg total) by mouth at bedtime as needed (Nausea or vomiting). 20 tablet 0  . prochlorperazine (COMPAZINE) 10 MG tablet Take 1 tablet (10 mg total) by mouth every 6 (six) hours as needed (Nausea or vomiting). 30 tablet 1   No current facility-administered medications for this visit.    OBJECTIVE: African-American woman who appears well  Vitals:   12/17/19 0938  BP: 123/73  Pulse: 67  Resp: 18  Temp: (!) 97.2 F (36.2 C)  SpO2: 100%     Body mass index is 33.97 kg/m.   Wt Readings from Last 3 Encounters:  12/17/19 194 lb 12.8 oz (88.4 kg)  12/03/19 191 lb 9.6 oz (86.9 kg)   11/19/19 188 lb 4.8 oz (85.4 kg)      ECOG FS:1 - Symptomatic but completely ambulatory  Sclerae unicteric, EOMs intact Wearing a mask No cervical or supraclavicular adenopathy Lungs no rales or rhonchi Heart regular rate and rhythm Abd soft, nontender, positive bowel sounds MSK no focal spinal tenderness, no upper extremity lymphedema Neuro: nonfocal, well oriented, appropriate affect Breasts: I do not palpate a well-defined mass in the left breast.  There are no skin or nipple changes of concern.  Both axillae are benign.   LAB RESULTS:  CMP     Component Value Date/Time   NA 142 12/17/2019 0840   K 3.8 12/17/2019 0840   CL 110 12/17/2019 0840   CO2 25 12/17/2019 0840   GLUCOSE 90 12/17/2019 0840   BUN 11 12/17/2019 0840   CREATININE 0.82 12/17/2019 0840   CALCIUM 8.9 12/17/2019 0840   PROT 5.9 (L) 12/17/2019 0840   ALBUMIN 3.3 (L) 12/17/2019 0840   AST 12 (L) 12/17/2019 0840   ALT 10 12/17/2019 0840   ALKPHOS 94 12/17/2019 0840  BILITOT <0.2 (L) 12/17/2019 0840   GFRNONAA >60 12/17/2019 0840   GFRAA 58 (L) 10/09/2019 0806    No results found for: TOTALPROTELP, ALBUMINELP, A1GS, A2GS, BETS, BETA2SER, GAMS, MSPIKE, SPEI  Lab Results  Component Value Date   WBC 15.0 (H) 12/17/2019   NEUTROABS 10.9 (H) 12/17/2019   HGB 9.7 (L) 12/17/2019   HCT 29.2 (L) 12/17/2019   MCV 96.4 12/17/2019   PLT 125 (L) 12/17/2019    No results found for: LABCA2  No components found for: [id number]  No results for input(s): INR in the last 168 hours.  No results found for: LABCA2  No results found for: GIL200  No results found for: YHH930  No results found for: HSF799  No results found for: CA2729  No components found for: HGQUANT  No results found for: CEA1 / No results found for: CEA1   No results found for: AFPTUMOR  No results found for: CHROMOGRNA  No results found for: KPAFRELGTCHN, LAMBDASER, KAPLAMBRATIO (kappa/lambda light chains)  No results found  for: HGBA, HGBA2QUANT, HGBFQUANT, HGBSQUAN (Hemoglobinopathy evaluation)   No results found for: LDH  No results found for: IRON, TIBC, IRONPCTSAT (Iron and TIBC)  No results found for: FERRITIN  Urinalysis No results found for: COLORURINE, APPEARANCEUR, LABSPEC, PHURINE, GLUCOSEU, HGBUR, BILIRUBINUR, KETONESUR, PROTEINUR, UROBILINOGEN, NITRITE, LEUKOCYTESUR   STUDIES: No results found.   ELIGIBLE FOR AVAILABLE RESEARCH PROTOCOL: AET  ASSESSMENT: 69 y.o. [place] woman status post left breast upper outer quadrant biopsy 09/27/2019 for a clinically T1-T2 N0, stage IA- IIA invasive ductal carcinoma, grade 3, triple negative, with an MIB-1 of 90%.  (1) genetics testing results pending  (2) neoadjuvant chemotherapy will consist of doxorubicin and cyclophosphamide in dose dense fashion x4 starting 10/22/2019 to be followed by weekly carboplatin and paclitaxel x12  (a) echo 10/17/2019 shows an ejection fraction in the 60-65% range  (3) definitive surgery to follow  (4) adjuvant radiation as appropriate   PLAN: Leeah appears to be having a good initial response to her chemotherapy.  She is however becoming slightly anemic.  We will have to watch that.  She will start the Taxol/carbo today.  We again reviewed peripheral neuropathy issues.  I think what she is experiencing in the morning is more likely carpal tunnel symptoms.  I asked her to get a wrist splint for her right wrist and wear it overnight and see if that begins to resolve those issues  I asked her to keep a symptom diary.  She is going to see me again in a week.  At that point we will troubleshoot any problems that may have developed from today's visit  Total encounter time 25 minutes.Raymond Gurney C. Daisja Kessinger, MD 12/17/2019 10:05 AM Medical Oncology and Hematology [facility] [address] [place], [place] [address] Tel. [phone]    Fax. [phone]   This document serves as a record of services  personally performed by Ruthann Cancer, MD. It was created on his behalf by Mickie Bail, a trained medical scribe. The creation of this record is based on the scribe's personal observations and the provider's statements to them.   I, Ruthann Cancer MD, have reviewed the above documentation for accuracy and completeness, and I agree with the above.   *Total Encounter Time as defined by the Centers for Medicare and Medicaid Services includes, in addition to the face-to-face time of a patient visit (documented in the note above) non-face-to-face time: obtaining and reviewing outside history, ordering and  reviewing medications, tests or procedures, care coordination (communications with other health care professionals or caregivers) and documentation in the medical record.  

## 2019-12-17 ENCOUNTER — Inpatient Hospital Stay: Payer: Medicare HMO | Admitting: Oncology

## 2019-12-17 ENCOUNTER — Other Ambulatory Visit: Payer: Self-pay

## 2019-12-17 ENCOUNTER — Inpatient Hospital Stay: Payer: Medicare HMO

## 2019-12-17 ENCOUNTER — Inpatient Hospital Stay: Payer: Medicare HMO | Attending: Oncology

## 2019-12-17 VITALS — BP 123/73 | HR 67 | Temp 97.2°F | Resp 18 | Ht 63.5 in | Wt 194.8 lb

## 2019-12-17 VITALS — BP 134/70 | HR 68 | Temp 98.2°F | Resp 18

## 2019-12-17 DIAGNOSIS — Z171 Estrogen receptor negative status [ER-]: Secondary | ICD-10-CM | POA: Insufficient documentation

## 2019-12-17 DIAGNOSIS — C50412 Malignant neoplasm of upper-outer quadrant of left female breast: Secondary | ICD-10-CM | POA: Diagnosis not present

## 2019-12-17 DIAGNOSIS — G4730 Sleep apnea, unspecified: Secondary | ICD-10-CM | POA: Diagnosis not present

## 2019-12-17 DIAGNOSIS — Z5111 Encounter for antineoplastic chemotherapy: Secondary | ICD-10-CM | POA: Insufficient documentation

## 2019-12-17 DIAGNOSIS — Z803 Family history of malignant neoplasm of breast: Secondary | ICD-10-CM | POA: Diagnosis not present

## 2019-12-17 DIAGNOSIS — Z90710 Acquired absence of both cervix and uterus: Secondary | ICD-10-CM | POA: Insufficient documentation

## 2019-12-17 DIAGNOSIS — D649 Anemia, unspecified: Secondary | ICD-10-CM | POA: Diagnosis not present

## 2019-12-17 DIAGNOSIS — E669 Obesity, unspecified: Secondary | ICD-10-CM | POA: Diagnosis not present

## 2019-12-17 DIAGNOSIS — Z79899 Other long term (current) drug therapy: Secondary | ICD-10-CM | POA: Insufficient documentation

## 2019-12-17 LAB — CMP (CANCER CENTER ONLY)
ALT: 10 U/L (ref 0–44)
AST: 12 U/L — ABNORMAL LOW (ref 15–41)
Albumin: 3.3 g/dL — ABNORMAL LOW (ref 3.5–5.0)
Alkaline Phosphatase: 94 U/L (ref 38–126)
Anion gap: 7 (ref 5–15)
BUN: 11 mg/dL (ref 8–23)
CO2: 25 mmol/L (ref 22–32)
Calcium: 8.9 mg/dL (ref 8.9–10.3)
Chloride: 110 mmol/L (ref 98–111)
Creatinine: 0.82 mg/dL (ref 0.44–1.00)
GFR, Estimated: >60 mL/min (ref >60)
Glucose, Bld: 90 mg/dL (ref 70–99)
Potassium: 3.8 mmol/L (ref 3.5–5.1)
Sodium: 142 mmol/L (ref 135–145)
Total Bilirubin: <0.2 mg/dL — ABNORMAL LOW (ref 0.3–1.2)
Total Protein: 5.9 g/dL — ABNORMAL LOW (ref 6.5–8.1)

## 2019-12-17 LAB — CBC WITH DIFFERENTIAL (CANCER CENTER ONLY)
Abs Immature Granulocytes: 2.21 10*3/uL — ABNORMAL HIGH (ref 0.00–0.07)
Basophils Absolute: 0.1 10*3/uL (ref 0.0–0.1)
Basophils Relative: 1 %
Eosinophils Absolute: 0 10*3/uL (ref 0.0–0.5)
Eosinophils Relative: 0 %
HCT: 29.2 % — ABNORMAL LOW (ref 36.0–46.0)
Hemoglobin: 9.7 g/dL — ABNORMAL LOW (ref 12.0–15.0)
Immature Granulocytes: 15 %
Lymphocytes Relative: 6 %
Lymphs Abs: 0.8 10*3/uL (ref 0.7–4.0)
MCH: 32 pg (ref 26.0–34.0)
MCHC: 33.2 g/dL (ref 30.0–36.0)
MCV: 96.4 fL (ref 80.0–100.0)
Monocytes Absolute: 1 10*3/uL (ref 0.1–1.0)
Monocytes Relative: 6 %
Neutro Abs: 10.9 10*3/uL — ABNORMAL HIGH (ref 1.7–7.7)
Neutrophils Relative %: 72 %
Platelet Count: 125 10*3/uL — ABNORMAL LOW (ref 150–400)
RBC: 3.03 MIL/uL — ABNORMAL LOW (ref 3.87–5.11)
RDW: 15.9 % — ABNORMAL HIGH (ref 11.5–15.5)
WBC Count: 15 10*3/uL — ABNORMAL HIGH (ref 4.0–10.5)
nRBC: 0.2 % (ref 0.0–0.2)

## 2019-12-17 MED ORDER — PALONOSETRON HCL INJECTION 0.25 MG/5ML
0.2500 mg | Freq: Once | INTRAVENOUS | Status: AC
  Administered 2019-12-17: 0.25 mg via INTRAVENOUS

## 2019-12-17 MED ORDER — SODIUM CHLORIDE 0.9 % IV SOLN
150.0000 mg | Freq: Once | INTRAVENOUS | Status: AC
  Administered 2019-12-17: 150 mg via INTRAVENOUS
  Filled 2019-12-17: qty 150

## 2019-12-17 MED ORDER — DIPHENHYDRAMINE HCL 50 MG/ML IJ SOLN
25.0000 mg | Freq: Once | INTRAMUSCULAR | Status: AC
  Administered 2019-12-17: 25 mg via INTRAVENOUS

## 2019-12-17 MED ORDER — SODIUM CHLORIDE 0.9 % IV SOLN
80.0000 mg/m2 | Freq: Once | INTRAVENOUS | Status: AC
  Administered 2019-12-17: 156 mg via INTRAVENOUS
  Filled 2019-12-17: qty 26

## 2019-12-17 MED ORDER — SODIUM CHLORIDE 0.9% FLUSH
10.0000 mL | INTRAVENOUS | Status: DC | PRN
  Filled 2019-12-17: qty 10

## 2019-12-17 MED ORDER — DIPHENHYDRAMINE HCL 50 MG/ML IJ SOLN
INTRAMUSCULAR | Status: AC
  Filled 2019-12-17: qty 1

## 2019-12-17 MED ORDER — FAMOTIDINE IN NACL 20-0.9 MG/50ML-% IV SOLN
INTRAVENOUS | Status: AC
  Filled 2019-12-17: qty 50

## 2019-12-17 MED ORDER — HEPARIN SOD (PORK) LOCK FLUSH 100 UNIT/ML IV SOLN
500.0000 [IU] | Freq: Once | INTRAVENOUS | Status: DC | PRN
  Filled 2019-12-17: qty 5

## 2019-12-17 MED ORDER — PALONOSETRON HCL INJECTION 0.25 MG/5ML
INTRAVENOUS | Status: AC
  Filled 2019-12-17: qty 5

## 2019-12-17 MED ORDER — SODIUM CHLORIDE 0.9 % IV SOLN
187.8000 mg | Freq: Once | INTRAVENOUS | Status: AC
  Administered 2019-12-17: 190 mg via INTRAVENOUS
  Filled 2019-12-17: qty 19

## 2019-12-17 MED ORDER — FAMOTIDINE IN NACL 20-0.9 MG/50ML-% IV SOLN
20.0000 mg | Freq: Once | INTRAVENOUS | Status: AC
  Administered 2019-12-17: 20 mg via INTRAVENOUS

## 2019-12-17 MED ORDER — SODIUM CHLORIDE 0.9 % IV SOLN
Freq: Once | INTRAVENOUS | Status: AC
  Filled 2019-12-17: qty 250

## 2019-12-17 MED ORDER — SODIUM CHLORIDE 0.9 % IV SOLN
10.0000 mg | Freq: Once | INTRAVENOUS | Status: AC
  Administered 2019-12-17: 10 mg via INTRAVENOUS
  Filled 2019-12-17: qty 10

## 2019-12-17 NOTE — Patient Instructions (Signed)
[place] Discharge Instructions for Patients Receiving Chemotherapy  Today you received the following chemotherapy agents:carboplatin, Taxol   To help prevent nausea and vomiting after your treatment, we encourage you to take your nausea medication as directed.   If you develop nausea and vomiting that is not controlled by your nausea medication, call the clinic.   BELOW ARE SYMPTOMS THAT SHOULD BE REPORTED IMMEDIATELY:  *FEVER GREATER THAN 100.5 F  *CHILLS WITH OR WITHOUT FEVER  NAUSEA AND VOMITING THAT IS NOT CONTROLLED WITH YOUR NAUSEA MEDICATION  *UNUSUAL SHORTNESS OF BREATH  *UNUSUAL BRUISING OR BLEEDING  TENDERNESS IN MOUTH AND THROAT WITH OR WITHOUT PRESENCE OF ULCERS  *URINARY PROBLEMS  *BOWEL PROBLEMS  UNUSUAL RASH Items with * indicate a potential emergency and should be followed up as soon as possible.  Feel free to call the clinic should you have any questions or concerns. The clinic phone number is [phone].  Please show the [place] at check-in to the Emergency Department and triage nurse.

## 2019-12-17 NOTE — Progress Notes (Signed)
Pt discharged in no apparent distress. Pt left ambulatory without assistance. Pt aware of discharge instructions and verbalized understanding and had no further questions.  

## 2019-12-17 NOTE — Patient Instructions (Signed)

## 2019-12-18 ENCOUNTER — Telehealth: Payer: Self-pay | Admitting: *Deleted

## 2019-12-18 ENCOUNTER — Telehealth: Payer: Self-pay | Admitting: Oncology

## 2019-12-18 NOTE — Telephone Encounter (Signed)
Scheduled GM follow up between appt that were already scheduled. Made no changes to pt's original arrival time.

## 2019-12-18 NOTE — Telephone Encounter (Signed)
Called pt to see how she did with her chemo yest. She reports doing well with no c/o's.  She is out shopping.  She knows how to reach [place] if she has questions/concerns.

## 2019-12-18 NOTE — Telephone Encounter (Signed)
-----   Message from Tildon Husky, RN sent at 12/17/2019  2:25 PM EST ----- Regarding: first time chemo First time taxol/ [place] patient of Dr Jana Hakim. Patient tolerated well

## 2019-12-23 NOTE — Progress Notes (Signed)
Cheyenne Mcdonald  [phone] [phone]     ID: Cheyenne Mcdonald DOB: Aug 13, 1950  MR#: [id number]  LGX#:[id number]  Patient Care Team: Benito Mccreedy, MD as PCP - General (Internal Medicine) Servando Salina, MD as Consulting Physician (Obstetrics and Gynecology) Mauro Kaufmann, RN as Oncology Nurse Navigator Rockwell Germany, RN as Oncology Nurse Navigator [name], MD as Consulting Physician (General Surgery) Mikaya Bunner, Virgie Dad, MD as Consulting Physician (Oncology) Gery Pray, MD as Consulting Physician (Radiation Oncology) Dimas Aguas, MD as Consulting Physician (Nephrology) Chauncey Cruel, MD OTHER MD:  CHIEF COMPLAINT: triple negative breast cancer  CURRENT TREATMENT: Neoadjuvant chemotherapy   INTERVAL HISTORY: Cheyenne Mcdonald returns today for follow up of her triple negative breast cancer.  She is accompanied by her husband  She completed her 4 cycles of cyclophosphamide and doxorubicin and began weekly paclitaxel and carboplatin, which will be repeated x12, on 12/17/2019. Today is week 2.  Her most recent echocardiogram from 10/17/2019 showed an ejection fraction of 60-65%.   REVIEW OF SYSTEMS: Cheyenne Mcdonald continues to do remarkably well with her chemotherapy.  She had a minimal headache with the treatment, minimal constipation which she took care of by eating some extra fruit.  She had no mouth sores, no problems with her port, and certainly no peripheral neuropathy symptoms.  She had a metal taste which likely is going to continue until she is done with chemo.  She had some sniffles.  She is not walking regularly but her usual program is an hour of walking 3-4 times a week and she is planning to get back to that.  Otherwise a detailed review of systems today was stable   COVID 19 VACCINATION STATUS: Status post [place] x2 with booster October 2021   HISTORY OF CURRENT ILLNESS: From the original intake note:  Cheyenne Mcdonald  presented for her routine mammography with a palpable left breast lump. She underwent bilateral diagnostic mammography with tomography and left breast ultrasonography at [facility] on 09/13/2019 showing: breast density category B; two adjacent masses in left breast at 1 o'clock, spanning approximately 3.3 cm; one indeterminate left axillary lymph node with 0.4 cm cortical bulge; no evidence right breast malignancy.  Accordingly on 09/27/2019 she proceeded to biopsy of the left breast areas in question. The pathology from this procedure ([id number]) showed: invasive ductal carcinoma, grade 3, present in both of the adjacent masses. Prognostic indicators significant for: estrogen receptor, 0% negative and progesterone receptor, 0% negative. Proliferation marker Ki67 at 90%. HER2 equivocal by immunohistochemistry (2+), but negative by fluorescent in situ hybridization with a signals ratio 2.2 and number per cell 3.3. Additional analysis of more cells showed a signals ratio 2.05 and number per cell 3.13.  The biopsied lymph node was negative for carcinoma.  This was felt to be concordant  The patient's subsequent history is as detailed below.   PAST MEDICAL HISTORY: Past Medical History:  Diagnosis Date  . Breast cancer ([place])   . Obesity   . Sleep apnea   heart murmur (since childhood)   PAST SURGICAL HISTORY: Past Surgical History:  Procedure Laterality Date  . ABDOMINAL HYSTERECTOMY    . IR IMAGING GUIDED PORT INSERTION  10/18/2019    FAMILY HISTORY: Family History  Problem Relation Age of Onset  . Breast cancer Cousin 68       maternal cousin; bilateral     Her father died at age 12, cause of death unknown. Her mother is age 61 as of  09/2019. [name] has two brothers (and no sisters). She reports one cousin with breast cancer at age 3.   GYNECOLOGIC HISTORY:  No LMP recorded. Patient has had a hysterectomy. Menarche: 69 years old Age at first live birth: 69 years old [place] P 2 LMP  1990 Contraceptive: used for maybe 20 years HRT: never used  Hysterectomy? Yes, 1990 BSO? 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 Husband [name] is retired Nature conservation officer and then retired Civil Service fast streamer.  At home is just the 2 of them. Daughter [name], age 81, works in [place] entry in [employer]. Son [name], age 10, has a college degree but works for [employer] in [employer]. [name] has four grandchildren. She attends [employer].    ADVANCED DIRECTIVES: In place   HEALTH MAINTENANCE: Social History   Tobacco Use  . Smoking status: Never Smoker  . Smokeless tobacco: Never Used  Substance Use Topics  . Alcohol use: Never  . Drug use: Never     Colonoscopy: 2018 (Dr. Collene Mares)  PAP: approx. 2013  Bone density: 2016, "normal"   Allergies  Allergen Reactions  . Other Itching  . Shellfish Allergy Itching    Current Outpatient Medications  Medication Sig Dispense Refill  . Cholecalciferol 125 MCG (5000 UT) capsule Take by mouth.    . lidocaine-prilocaine (EMLA) cream Apply to affected area once 30 g 3  . loratadine (CLARITIN) 10 MG tablet Take 1 tablet (10 mg total) by mouth daily. 40 tablet 0  . LORazepam (ATIVAN) 0.5 MG tablet Take 1 tablet (0.5 mg total) by mouth at bedtime as needed (Nausea or vomiting). 20 tablet 0  . prochlorperazine (COMPAZINE) 10 MG tablet Take 1 tablet (10 mg total) by mouth every 6 (six) hours as needed (Nausea or vomiting). 30 tablet 1   No current facility-administered medications for this visit.    OBJECTIVE: African-American woman who appears well  Vitals:   12/24/19 1135 12/24/19 1139  BP: (!) 127/58 122/60  Pulse: 66   Resp: 18   Temp: 97.7 F (36.5 C)   SpO2: 100%      Body mass index is 33.08 kg/m.   Wt Readings from Last 3 Encounters:  12/24/19 189 lb 11.2 oz (86 kg)  12/17/19 194 lb 12.8 oz (88.4 kg)  12/03/19 191 lb 9.6 oz (86.9 kg)      ECOG FS:1 - Symptomatic  but completely ambulatory  Sclerae unicteric, EOMs intact Wearing a mask No cervical or supraclavicular adenopathy Lungs no rales or rhonchi Heart regular rate and rhythm Abd soft, nontender, positive bowel sounds MSK no focal spinal tenderness, no upper extremity lymphedema Neuro: nonfocal, well oriented, appropriate affect Breasts: Deferred   LAB RESULTS:  CMP     Component Value Date/Time   NA 142 12/17/2019 0840   K 3.8 12/17/2019 0840   CL 110 12/17/2019 0840   CO2 25 12/17/2019 0840   GLUCOSE 90 12/17/2019 0840   BUN 11 12/17/2019 0840   CREATININE 0.82 12/17/2019 0840   CALCIUM 8.9 12/17/2019 0840   PROT 5.9 (L) 12/17/2019 0840   ALBUMIN 3.3 (L) 12/17/2019 0840   AST 12 (L) 12/17/2019 0840   ALT 10 12/17/2019 0840   ALKPHOS 94 12/17/2019 0840   BILITOT <0.2 (L) 12/17/2019 0840   GFRNONAA >60 12/17/2019 0840   GFRAA 58 (L) 10/09/2019 0806    No results found for: TOTALPROTELP, ALBUMINELP, A1GS, A2GS, BETS, BETA2SER, [place], MSPIKE, SPEI  Lab Results  Component Value Date   WBC 4.8 12/24/2019   NEUTROABS 3.4 12/24/2019   HGB 9.7 (L) 12/24/2019   HCT 28.8 (L) 12/24/2019   MCV 93.8 12/24/2019   PLT 201 12/24/2019    No results found for: LABCA2  No components found for: ZDGLOV564  No results for input(s): INR in the last 168 hours.  No results found for: LABCA2  No results found for: PPI951  No results found for: OAC166  No results found for: AYT016  No results found for: CA2729  No components found for: HGQUANT  No results found for: CEA1 / No results found for: CEA1   No results found for: AFPTUMOR  No results found for: CHROMOGRNA  No results found for: KPAFRELGTCHN, LAMBDASER, KAPLAMBRATIO (kappa/lambda light chains)  No results found for: HGBA, HGBA2QUANT, HGBFQUANT, HGBSQUAN (Hemoglobinopathy evaluation)   No results found for: LDH  No results found for: IRON, TIBC, IRONPCTSAT (Iron and TIBC)  No results found for:  FERRITIN  Urinalysis No results found for: COLORURINE, APPEARANCEUR, LABSPEC, PHURINE, GLUCOSEU, HGBUR, BILIRUBINUR, KETONESUR, PROTEINUR, UROBILINOGEN, NITRITE, LEUKOCYTESUR   STUDIES: No results found.   ELIGIBLE FOR AVAILABLE RESEARCH PROTOCOL: AET  ASSESSMENT: 69 y.o. [place] woman status post left breast upper outer quadrant biopsy 09/27/2019 for a clinically T1-T2 N0, stage IA- IIA invasive ductal carcinoma, grade 3, triple negative, with an MIB-1 of 90%.  (1) genetics testing results pending  (2) neoadjuvant chemotherapy will consist of doxorubicin and cyclophosphamide in dose dense fashion x4 starting 10/22/2019 to be followed by weekly carboplatin and paclitaxel x12  (a) echo 10/17/2019 shows an ejection fraction in the 60-65% range  (3) definitive surgery to follow  (4) adjuvant radiation as appropriate   PLAN: Dalayza is tolerated the carboplatin and paclitaxel weekly remarkably well.  I am not making any changes in her treatment.  Normally we see patients like her every 2weeks until they get to the eighth cycle when we follow them more closely.  Of course the reason for this is the concerns regarding peripheral neuropathy.  I have entered the appropriate orders for that.  She understands the metal taste is going to continue.  I suggested she could use some herbal teas instead of plain water or perhaps put a little bit of lemon or lime in the water.  The sniffles are related to the paclitaxel and sometimes patients have mild epistaxis.  That generally is of no consequence and we do not interrupt treatment for that.  She knows to call for any other issue that may develop before the next visit  Total encounter time 25 minutes.Sarajane Jews C. [name] Lievanos, MD 12/24/2019 11:51 AM Medical Oncology and Hematology [facility] [place], [place] [address] Tel. [phone]    Fax. [phone]   This document serves as a record of services personally  performed by Lurline Del, MD. It was created on his behalf by Wilburn Mylar, a trained medical scribe. The creation of this record is based on the scribe's personal observations and the provider's statements to them.   I, Lurline Del MD, have reviewed the above documentation for accuracy and completeness, and I agree with the above.   *Total Encounter Time as defined by the Centers for Medicare and Medicaid Services includes, in addition to the face-to-face time of a patient visit (documented in the note above) non-face-to-face time: obtaining and reviewing outside history, ordering and reviewing medications, tests or procedures, care coordination (communications with other health  care professionals or caregivers) and documentation in the medical record.

## 2019-12-24 ENCOUNTER — Inpatient Hospital Stay: Payer: Medicare HMO

## 2019-12-24 ENCOUNTER — Other Ambulatory Visit: Payer: Self-pay

## 2019-12-24 ENCOUNTER — Inpatient Hospital Stay: Payer: Medicare HMO | Admitting: Oncology

## 2019-12-24 ENCOUNTER — Encounter: Payer: Self-pay | Admitting: *Deleted

## 2019-12-24 VITALS — BP 122/60 | HR 66 | Temp 97.7°F | Resp 18 | Ht 63.5 in | Wt 189.7 lb

## 2019-12-24 DIAGNOSIS — C50412 Malignant neoplasm of upper-outer quadrant of left female breast: Secondary | ICD-10-CM

## 2019-12-24 DIAGNOSIS — Z90710 Acquired absence of both cervix and uterus: Secondary | ICD-10-CM | POA: Diagnosis not present

## 2019-12-24 DIAGNOSIS — Z171 Estrogen receptor negative status [ER-]: Secondary | ICD-10-CM

## 2019-12-24 DIAGNOSIS — Z79899 Other long term (current) drug therapy: Secondary | ICD-10-CM | POA: Diagnosis not present

## 2019-12-24 DIAGNOSIS — E669 Obesity, unspecified: Secondary | ICD-10-CM | POA: Diagnosis not present

## 2019-12-24 DIAGNOSIS — G4730 Sleep apnea, unspecified: Secondary | ICD-10-CM | POA: Diagnosis not present

## 2019-12-24 DIAGNOSIS — Z5111 Encounter for antineoplastic chemotherapy: Secondary | ICD-10-CM | POA: Diagnosis not present

## 2019-12-24 DIAGNOSIS — Z803 Family history of malignant neoplasm of breast: Secondary | ICD-10-CM | POA: Diagnosis not present

## 2019-12-24 DIAGNOSIS — D649 Anemia, unspecified: Secondary | ICD-10-CM | POA: Diagnosis not present

## 2019-12-24 LAB — CBC WITH DIFFERENTIAL (CANCER CENTER ONLY)
Abs Immature Granulocytes: 0.07 10*3/uL (ref 0.00–0.07)
Basophils Absolute: 0.1 10*3/uL (ref 0.0–0.1)
Basophils Relative: 1 %
Eosinophils Absolute: 0 10*3/uL (ref 0.0–0.5)
Eosinophils Relative: 1 %
HCT: 28.8 % — ABNORMAL LOW (ref 36.0–46.0)
Hemoglobin: 9.7 g/dL — ABNORMAL LOW (ref 12.0–15.0)
Immature Granulocytes: 2 %
Lymphocytes Relative: 12 %
Lymphs Abs: 0.6 10*3/uL — ABNORMAL LOW (ref 0.7–4.0)
MCH: 31.6 pg (ref 26.0–34.0)
MCHC: 33.7 g/dL (ref 30.0–36.0)
MCV: 93.8 fL (ref 80.0–100.0)
Monocytes Absolute: 0.7 10*3/uL (ref 0.1–1.0)
Monocytes Relative: 14 %
Neutro Abs: 3.4 10*3/uL (ref 1.7–7.7)
Neutrophils Relative %: 70 %
Platelet Count: 201 10*3/uL (ref 150–400)
RBC: 3.07 MIL/uL — ABNORMAL LOW (ref 3.87–5.11)
RDW: 15.5 % (ref 11.5–15.5)
WBC Count: 4.8 10*3/uL (ref 4.0–10.5)
nRBC: 0 % (ref 0.0–0.2)

## 2019-12-24 LAB — CMP (CANCER CENTER ONLY)
ALT: 12 U/L (ref 0–44)
AST: 12 U/L — ABNORMAL LOW (ref 15–41)
Albumin: 3.5 g/dL (ref 3.5–5.0)
Alkaline Phosphatase: 69 U/L (ref 38–126)
Anion gap: 5 (ref 5–15)
BUN: 18 mg/dL (ref 8–23)
CO2: 28 mmol/L (ref 22–32)
Calcium: 9.6 mg/dL (ref 8.9–10.3)
Chloride: 106 mmol/L (ref 98–111)
Creatinine: 0.88 mg/dL (ref 0.44–1.00)
GFR, Estimated: >60 mL/min (ref >60)
Glucose, Bld: 108 mg/dL — ABNORMAL HIGH (ref 70–99)
Potassium: 3.9 mmol/L (ref 3.5–5.1)
Sodium: 139 mmol/L (ref 135–145)
Total Bilirubin: 0.4 mg/dL (ref 0.3–1.2)
Total Protein: 6.2 g/dL — ABNORMAL LOW (ref 6.5–8.1)

## 2019-12-24 MED ORDER — SODIUM CHLORIDE 0.9 % IV SOLN
10.0000 mg | Freq: Once | INTRAVENOUS | Status: AC
  Administered 2019-12-24: 10 mg via INTRAVENOUS
  Filled 2019-12-24: qty 10

## 2019-12-24 MED ORDER — FAMOTIDINE IN NACL 20-0.9 MG/50ML-% IV SOLN
INTRAVENOUS | Status: AC
  Filled 2019-12-24: qty 50

## 2019-12-24 MED ORDER — SODIUM CHLORIDE 0.9 % IV SOLN
150.0000 mg | Freq: Once | INTRAVENOUS | Status: AC
  Administered 2019-12-24: 150 mg via INTRAVENOUS
  Filled 2019-12-24: qty 150

## 2019-12-24 MED ORDER — SODIUM CHLORIDE 0.9 % IV SOLN
187.8000 mg | Freq: Once | INTRAVENOUS | Status: AC
  Administered 2019-12-24: 190 mg via INTRAVENOUS
  Filled 2019-12-24: qty 19

## 2019-12-24 MED ORDER — SODIUM CHLORIDE 0.9% FLUSH
10.0000 mL | INTRAVENOUS | Status: DC | PRN
  Administered 2019-12-24: 10 mL
  Filled 2019-12-24: qty 10

## 2019-12-24 MED ORDER — DIPHENHYDRAMINE HCL 50 MG/ML IJ SOLN
INTRAMUSCULAR | Status: AC
  Filled 2019-12-24: qty 1

## 2019-12-24 MED ORDER — SODIUM CHLORIDE 0.9 % IV SOLN
80.0000 mg/m2 | Freq: Once | INTRAVENOUS | Status: AC
  Administered 2019-12-24: 156 mg via INTRAVENOUS
  Filled 2019-12-24: qty 26

## 2019-12-24 MED ORDER — HEPARIN SOD (PORK) LOCK FLUSH 100 UNIT/ML IV SOLN
500.0000 [IU] | Freq: Once | INTRAVENOUS | Status: AC | PRN
  Administered 2019-12-24: 500 [IU]
  Filled 2019-12-24: qty 5

## 2019-12-24 MED ORDER — PALONOSETRON HCL INJECTION 0.25 MG/5ML
INTRAVENOUS | Status: AC
  Filled 2019-12-24: qty 5

## 2019-12-24 MED ORDER — DIPHENHYDRAMINE HCL 50 MG/ML IJ SOLN
25.0000 mg | Freq: Once | INTRAMUSCULAR | Status: AC
  Administered 2019-12-24: 25 mg via INTRAVENOUS

## 2019-12-24 MED ORDER — SODIUM CHLORIDE 0.9 % IV SOLN
Freq: Once | INTRAVENOUS | Status: AC
  Filled 2019-12-24: qty 250

## 2019-12-24 MED ORDER — FAMOTIDINE IN NACL 20-0.9 MG/50ML-% IV SOLN
20.0000 mg | Freq: Once | INTRAVENOUS | Status: AC
Start: 2019-12-24 — End: 2019-12-24
  Administered 2019-12-24: 20 mg via INTRAVENOUS

## 2019-12-24 MED ORDER — PALONOSETRON HCL INJECTION 0.25 MG/5ML
0.2500 mg | Freq: Once | INTRAVENOUS | Status: AC
  Administered 2019-12-24: 0.25 mg via INTRAVENOUS

## 2019-12-24 NOTE — Patient Instructions (Signed)
[facility] Discharge Instructions for Patients Receiving Chemotherapy  Today you received the following chemotherapy agents: Paclitaxel, Carboplatin  To help prevent nausea and vomiting after your treatment, we encourage you to take your nausea medication as directed.   If you develop nausea and vomiting that is not controlled by your nausea medication, call the clinic.   BELOW ARE SYMPTOMS THAT SHOULD BE REPORTED IMMEDIATELY:  *FEVER GREATER THAN 100.5 F  *CHILLS WITH OR WITHOUT FEVER  NAUSEA AND VOMITING THAT IS NOT CONTROLLED WITH YOUR NAUSEA MEDICATION  *UNUSUAL SHORTNESS OF BREATH  *UNUSUAL BRUISING OR BLEEDING  TENDERNESS IN MOUTH AND THROAT WITH OR WITHOUT PRESENCE OF ULCERS  *URINARY PROBLEMS  *BOWEL PROBLEMS  UNUSUAL RASH Items with * indicate a potential emergency and should be followed up as soon as possible.  Feel free to call the clinic should you have any questions or concerns. The clinic phone number is [phone].  Please show the CHEMO ALERT CARD at check-in to the Emergency Department and triage nurse.   

## 2019-12-26 DIAGNOSIS — E669 Obesity, unspecified: Secondary | ICD-10-CM | POA: Diagnosis not present

## 2019-12-26 DIAGNOSIS — G4733 Obstructive sleep apnea (adult) (pediatric): Secondary | ICD-10-CM | POA: Diagnosis not present

## 2019-12-26 DIAGNOSIS — R7303 Prediabetes: Secondary | ICD-10-CM | POA: Diagnosis not present

## 2019-12-26 DIAGNOSIS — D0510 Intraductal carcinoma in situ of unspecified breast: Secondary | ICD-10-CM | POA: Diagnosis not present

## 2019-12-26 DIAGNOSIS — E559 Vitamin D deficiency, unspecified: Secondary | ICD-10-CM | POA: Diagnosis not present

## 2019-12-26 DIAGNOSIS — N182 Chronic kidney disease, stage 2 (mild): Secondary | ICD-10-CM | POA: Diagnosis not present

## 2019-12-26 DIAGNOSIS — Z0001 Encounter for general adult medical examination with abnormal findings: Secondary | ICD-10-CM | POA: Diagnosis not present

## 2019-12-26 DIAGNOSIS — M1711 Unilateral primary osteoarthritis, right knee: Secondary | ICD-10-CM | POA: Diagnosis not present

## 2019-12-26 DIAGNOSIS — E782 Mixed hyperlipidemia: Secondary | ICD-10-CM | POA: Diagnosis not present

## 2019-12-31 ENCOUNTER — Inpatient Hospital Stay: Payer: Medicare HMO

## 2019-12-31 ENCOUNTER — Other Ambulatory Visit: Payer: Self-pay

## 2019-12-31 VITALS — BP 128/68 | HR 71 | Temp 98.7°F | Resp 20 | Wt 190.0 lb

## 2019-12-31 DIAGNOSIS — D649 Anemia, unspecified: Secondary | ICD-10-CM | POA: Diagnosis not present

## 2019-12-31 DIAGNOSIS — Z90710 Acquired absence of both cervix and uterus: Secondary | ICD-10-CM | POA: Diagnosis not present

## 2019-12-31 DIAGNOSIS — Z95828 Presence of other vascular implants and grafts: Secondary | ICD-10-CM

## 2019-12-31 DIAGNOSIS — C50412 Malignant neoplasm of upper-outer quadrant of left female breast: Secondary | ICD-10-CM | POA: Diagnosis not present

## 2019-12-31 DIAGNOSIS — Z5111 Encounter for antineoplastic chemotherapy: Secondary | ICD-10-CM | POA: Diagnosis not present

## 2019-12-31 DIAGNOSIS — Z79899 Other long term (current) drug therapy: Secondary | ICD-10-CM | POA: Diagnosis not present

## 2019-12-31 DIAGNOSIS — G4730 Sleep apnea, unspecified: Secondary | ICD-10-CM | POA: Diagnosis not present

## 2019-12-31 DIAGNOSIS — Z171 Estrogen receptor negative status [ER-]: Secondary | ICD-10-CM | POA: Diagnosis not present

## 2019-12-31 DIAGNOSIS — E669 Obesity, unspecified: Secondary | ICD-10-CM | POA: Diagnosis not present

## 2019-12-31 DIAGNOSIS — Z803 Family history of malignant neoplasm of breast: Secondary | ICD-10-CM | POA: Diagnosis not present

## 2019-12-31 LAB — CBC WITH DIFFERENTIAL (CANCER CENTER ONLY)
Abs Immature Granulocytes: 0.09 10*3/uL — ABNORMAL HIGH (ref 0.00–0.07)
Basophils Absolute: 0 10*3/uL (ref 0.0–0.1)
Basophils Relative: 1 %
Eosinophils Absolute: 0 10*3/uL (ref 0.0–0.5)
Eosinophils Relative: 1 %
HCT: 27.8 % — ABNORMAL LOW (ref 36.0–46.0)
Hemoglobin: 9.5 g/dL — ABNORMAL LOW (ref 12.0–15.0)
Immature Granulocytes: 2 %
Lymphocytes Relative: 14 %
Lymphs Abs: 0.6 10*3/uL — ABNORMAL LOW (ref 0.7–4.0)
MCH: 32.6 pg (ref 26.0–34.0)
MCHC: 34.2 g/dL (ref 30.0–36.0)
MCV: 95.5 fL (ref 80.0–100.0)
Monocytes Absolute: 0.6 10*3/uL (ref 0.1–1.0)
Monocytes Relative: 13 %
Neutro Abs: 3.2 10*3/uL (ref 1.7–7.7)
Neutrophils Relative %: 69 %
Platelet Count: 186 10*3/uL (ref 150–400)
RBC: 2.91 MIL/uL — ABNORMAL LOW (ref 3.87–5.11)
RDW: 16.5 % — ABNORMAL HIGH (ref 11.5–15.5)
WBC Count: 4.5 10*3/uL (ref 4.0–10.5)
nRBC: 0 % (ref 0.0–0.2)

## 2019-12-31 LAB — CMP (CANCER CENTER ONLY)
ALT: 13 U/L (ref 0–44)
AST: 15 U/L (ref 15–41)
Albumin: 3.6 g/dL (ref 3.5–5.0)
Alkaline Phosphatase: 66 U/L (ref 38–126)
Anion gap: 6 (ref 5–15)
BUN: 16 mg/dL (ref 8–23)
CO2: 24 mmol/L (ref 22–32)
Calcium: 9.3 mg/dL (ref 8.9–10.3)
Chloride: 111 mmol/L (ref 98–111)
Creatinine: 0.83 mg/dL (ref 0.44–1.00)
GFR, Estimated: >60 mL/min (ref >60)
Glucose, Bld: 94 mg/dL (ref 70–99)
Potassium: 4 mmol/L (ref 3.5–5.1)
Sodium: 141 mmol/L (ref 135–145)
Total Bilirubin: 0.3 mg/dL (ref 0.3–1.2)
Total Protein: 6.4 g/dL — ABNORMAL LOW (ref 6.5–8.1)

## 2019-12-31 MED ORDER — SODIUM CHLORIDE 0.9 % IV SOLN
80.0000 mg/m2 | Freq: Once | INTRAVENOUS | Status: AC
  Administered 2019-12-31: 156 mg via INTRAVENOUS
  Filled 2019-12-31: qty 26

## 2019-12-31 MED ORDER — SODIUM CHLORIDE 0.9 % IV SOLN
Freq: Once | INTRAVENOUS | Status: AC
  Filled 2019-12-31: qty 250

## 2019-12-31 MED ORDER — FAMOTIDINE IN NACL 20-0.9 MG/50ML-% IV SOLN
INTRAVENOUS | Status: AC
  Filled 2019-12-31: qty 50

## 2019-12-31 MED ORDER — PALONOSETRON HCL INJECTION 0.25 MG/5ML
0.2500 mg | Freq: Once | INTRAVENOUS | Status: AC
  Administered 2019-12-31: 0.25 mg via INTRAVENOUS

## 2019-12-31 MED ORDER — SODIUM CHLORIDE 0.9 % IV SOLN
190.0000 mg | Freq: Once | INTRAVENOUS | Status: AC
  Administered 2019-12-31: 190 mg via INTRAVENOUS
  Filled 2019-12-31: qty 19

## 2019-12-31 MED ORDER — SODIUM CHLORIDE 0.9% FLUSH
10.0000 mL | INTRAVENOUS | Status: DC | PRN
  Administered 2019-12-31: 10 mL
  Filled 2019-12-31: qty 10

## 2019-12-31 MED ORDER — SODIUM CHLORIDE 0.9 % IV SOLN
10.0000 mg | Freq: Once | INTRAVENOUS | Status: AC
  Administered 2019-12-31: 10 mg via INTRAVENOUS
  Filled 2019-12-31: qty 10

## 2019-12-31 MED ORDER — FOSAPREPITANT DIMEGLUMINE INJECTION 150 MG
150.0000 mg | Freq: Once | INTRAVENOUS | Status: AC
  Administered 2019-12-31: 150 mg via INTRAVENOUS
  Filled 2019-12-31: qty 150

## 2019-12-31 MED ORDER — DIPHENHYDRAMINE HCL 50 MG/ML IJ SOLN
25.0000 mg | Freq: Once | INTRAMUSCULAR | Status: AC
  Administered 2019-12-31: 25 mg via INTRAVENOUS

## 2019-12-31 MED ORDER — SODIUM CHLORIDE 0.9% FLUSH
10.0000 mL | Freq: Once | INTRAVENOUS | Status: AC
  Administered 2019-12-31: 10 mL
  Filled 2019-12-31: qty 10

## 2019-12-31 MED ORDER — DIPHENHYDRAMINE HCL 50 MG/ML IJ SOLN
INTRAMUSCULAR | Status: AC
  Filled 2019-12-31: qty 1

## 2019-12-31 MED ORDER — HEPARIN SOD (PORK) LOCK FLUSH 100 UNIT/ML IV SOLN
500.0000 [IU] | Freq: Once | INTRAVENOUS | Status: AC | PRN
  Administered 2019-12-31: 500 [IU]
  Filled 2019-12-31: qty 5

## 2019-12-31 MED ORDER — PALONOSETRON HCL INJECTION 0.25 MG/5ML
INTRAVENOUS | Status: AC
  Filled 2019-12-31: qty 5

## 2019-12-31 MED ORDER — FAMOTIDINE IN NACL 20-0.9 MG/50ML-% IV SOLN
20.0000 mg | Freq: Once | INTRAVENOUS | Status: AC
  Administered 2019-12-31: 20 mg via INTRAVENOUS

## 2019-12-31 NOTE — Patient Instructions (Signed)
   [facility] Discharge Instructions for Patients Receiving Chemotherapy  Today you received the following chemotherapy agents Taxol and Carboplatin   To help prevent nausea and vomiting after your treatment, we encourage you to take your nausea medication as directed.    If you develop nausea and vomiting that is not controlled by your nausea medication, call the clinic.   BELOW ARE SYMPTOMS THAT SHOULD BE REPORTED IMMEDIATELY:  *FEVER GREATER THAN 100.5 F  *CHILLS WITH OR WITHOUT FEVER  NAUSEA AND VOMITING THAT IS NOT CONTROLLED WITH YOUR NAUSEA MEDICATION  *UNUSUAL SHORTNESS OF BREATH  *UNUSUAL BRUISING OR BLEEDING  TENDERNESS IN MOUTH AND THROAT WITH OR WITHOUT PRESENCE OF ULCERS  *URINARY PROBLEMS  *BOWEL PROBLEMS  UNUSUAL RASH Items with * indicate a potential emergency and should be followed up as soon as possible.  Feel free to call the clinic should you have any questions or concerns. The clinic phone number is [phone].  Please show the CHEMO ALERT CARD at check-in to the Emergency Department and triage nurse.   

## 2020-01-01 MED ORDER — LIDOCAINE HCL 2 % IJ SOLN
INTRAMUSCULAR | Status: AC
  Filled 2020-01-01: qty 20

## 2020-01-06 NOTE — Progress Notes (Signed)
[facility] Progress Note   Cheyenne Mcdonald [id number] 03/01/1950 69 y.o.  Cheyenne Mcdonald is managed by Dr. Darnelle Catalan  Actively treated with chemotherapy/immunotherapy/hormonal therapy: yes  Current therapy: Carboplatin and paclitaxel  Last treated: 12/31/2019 (cycle 7)  Next scheduled appointment with provider: 01/21/2020  Assessment: Plan:    Malignant neoplasm of upper-outer quadrant of left breast in female, estrogen receptor negative (HCC)  Diarrhea, unspecified type   ER negative malignant neoplasm of the left breast: The Cheyenne presents to the clinic today for cycle #8 of carboplatin and paclitaxel.  We will proceed with her treatment today.  She will return for treatment only on 01/13/2018 due to and will be seen by Dr. Darnelle Catalan on 01/21/2020.    Diarrhea: Cheyenne Mcdonald was encouraged to use over-the-counter Imodium as needed for diarrhea.  Please see After Visit Summary for Cheyenne specific instructions.  Future Appointments  Date Time Provider Department Center  01/14/2020 10:15 AM [facility] LAB [facility] None  01/14/2020 10:30 AM [facility] FLUSH [facility] None  01/14/2020 11:30 AM [facility] INFUSION [facility] None  01/21/2020  7:45 AM [facility] LAB [facility] None  01/21/2020  8:00 AM [facility] FLUSH [facility] None  01/21/2020  8:30 AM Magrinat, Valentino Hue, MD [facility] None  01/21/2020  9:15 AM [facility] INFUSION [facility] None  01/28/2020 10:30 AM [facility] LAB [facility] None  01/28/2020 10:45 AM [facility] FLUSH [facility] None  01/28/2020 11:45 AM [facility] INFUSION [facility] None    No orders of the defined types were placed in this encounter.      Subjective:   Cheyenne ID:  Cheyenne Mcdonald is a 69 y.o. (DOB 04/05/1950) female.  Chief Complaint: No chief complaint on file.   HPI Cheyenne Mcdonald  is a 69 y.o. female with a diagnosis of an ER negative malignant neoplasm of the left breast.  She is followed by Dr.  Darnelle Catalan and presents to the clinic today for cycle #8 of carboplatin and paclitaxel.  She has been doing exceptionally well with her treatment thus far.  She has had some diarrhea and fatigue but is otherwise without issues of concern.  She continues to have a good appetite and remains very active.  She reports that she has not been drinking as much water as she should and has not been exercising as much as she had prior to chemotherapy.   Medications: I have reviewed the Cheyenne's current medications.  Allergies:  Allergies  Allergen Reactions  . Other Itching  . Shellfish Allergy Itching    Past Medical History:  Diagnosis Date  . Breast cancer (HCC)   . Obesity   . Sleep apnea     Past Surgical History:  Procedure Laterality Date  . ABDOMINAL HYSTERECTOMY    . IR IMAGING GUIDED PORT INSERTION  10/18/2019    Family History  Problem Relation Age of Onset  . Breast cancer Cousin 1       maternal cousin; bilateral     Social History   Socioeconomic History  . Marital status: Married    Spouse name: Not on file  . Number of children: Not on file  . Years of education: Not on file  . Highest education level: Not on file  Occupational History  . Not on file  Tobacco Use  . Smoking status: Never Smoker  . Smokeless tobacco: Never Used  Substance and Sexual Activity  . Alcohol use: Never  . Drug use: Never  . Sexual activity: Not on file  Other Topics Concern  . Not on file  Social History Narrative  . Not on file   Social Determinants of Health   Financial Resource Strain: Not on file  Food Insecurity: Not on file  Transportation Needs: Not on file  Physical Activity: Not on file  Stress: Not on file  Social Connections: Not on file  Intimate Partner Violence: Not on file    Past Medical History, Surgical history, Social history, and Family history were reviewed and updated as appropriate.   Please see review of systems for further details on the  Cheyenne's review from today.   Review of Systems:  Review of Systems  Constitutional: Positive for fatigue. Negative for appetite change, chills, diaphoresis and fever.  HENT: Negative for trouble swallowing and voice change.   Respiratory: Negative for cough, chest tightness, shortness of breath and wheezing.   Cardiovascular: Negative for chest pain and palpitations.  Gastrointestinal: Positive for diarrhea. Negative for abdominal pain, constipation, nausea and vomiting.  Musculoskeletal: Negative for back pain and myalgias.  Neurological: Negative for dizziness, light-headedness and headaches.    Objective:   Physical Exam:  BP 129/76 (BP Location: Right Arm, Cheyenne Position: Sitting)   Pulse 85   Temp (!) 97.5 F (36.4 C) (Tympanic)   Resp 18   Ht 5' 3.5" (1.613 m)   Wt 192 lb (87.1 kg)   SpO2 100%   BMI 33.48 kg/m  ECOG: 0  Physical Exam Constitutional:      General: She is not in acute distress.    Appearance: She is not diaphoretic.  HENT:     Head: Normocephalic and atraumatic.  Eyes:     General: No scleral icterus.       Right eye: No discharge.        Left eye: No discharge.     Conjunctiva/sclera: Conjunctivae normal.  Cardiovascular:     Rate and Rhythm: Normal rate and regular rhythm.     Heart sounds: Normal heart sounds. No murmur heard. No friction rub. No gallop.   Pulmonary:     Effort: Pulmonary effort is normal. No respiratory distress.     Breath sounds: Normal breath sounds. No wheezing or rales.  Skin:    General: Skin is warm and dry.     Findings: No erythema or rash.     Comments: An accessed right chest wall Port-A-Cath is noted.  There was no erythema or exudate appreciated.  Neurological:     Mental Status: She is alert.     Coordination: Coordination normal.     Gait: Gait normal.  Psychiatric:        Mood and Affect: Mood normal.        Behavior: Behavior normal.        Thought Content: Thought content normal.        Judgment:  Judgment normal.     Lab Review:     Component Value Date/Time   NA 137 01/07/2020 1031   K 4.0 01/07/2020 1031   CL 106 01/07/2020 1031   CO2 27 01/07/2020 1031   GLUCOSE 83 01/07/2020 1031   BUN 14 01/07/2020 1031   CREATININE 0.88 01/07/2020 1031   CALCIUM 9.3 01/07/2020 1031   PROT 6.2 (L) 01/07/2020 1031   ALBUMIN 3.6 01/07/2020 1031   AST 13 (L) 01/07/2020 1031   ALT 12 01/07/2020 1031   ALKPHOS 64 01/07/2020 1031   BILITOT 0.2 (L) 01/07/2020 1031   GFRNONAA >60 01/07/2020 1031   GFRAA 58 (  L) 10/09/2019 0806       Component Value Date/Time   WBC 4.1 01/07/2020 1031   WBC 5.4 10/18/2019 1005   RBC 3.03 (L) 01/07/2020 1031   HGB 9.9 (L) 01/07/2020 1031   HCT 29.4 (L) 01/07/2020 1031   PLT 167 01/07/2020 1031   MCV 97.0 01/07/2020 1031   MCH 32.7 01/07/2020 1031   MCHC 33.7 01/07/2020 1031   RDW 16.9 (H) 01/07/2020 1031   LYMPHSABS 0.6 (L) 01/07/2020 1031   MONOABS 0.5 01/07/2020 1031   EOSABS 0.0 01/07/2020 1031   BASOSABS 0.0 01/07/2020 1031   -------------------------------  Imaging from last 24 hours (if applicable):  Radiology interpretation: No results found.

## 2020-01-07 ENCOUNTER — Inpatient Hospital Stay: Payer: Medicare HMO | Admitting: Medical

## 2020-01-07 ENCOUNTER — Other Ambulatory Visit: Payer: Self-pay

## 2020-01-07 ENCOUNTER — Inpatient Hospital Stay: Payer: Medicare HMO

## 2020-01-07 VITALS — BP 129/76 | HR 85 | Temp 97.5°F | Resp 18 | Ht 63.5 in | Wt 192.0 lb

## 2020-01-07 DIAGNOSIS — C50412 Malignant neoplasm of upper-outer quadrant of left female breast: Secondary | ICD-10-CM

## 2020-01-07 DIAGNOSIS — Z79899 Other long term (current) drug therapy: Secondary | ICD-10-CM | POA: Diagnosis not present

## 2020-01-07 DIAGNOSIS — G4730 Sleep apnea, unspecified: Secondary | ICD-10-CM | POA: Diagnosis not present

## 2020-01-07 DIAGNOSIS — Z95828 Presence of other vascular implants and grafts: Secondary | ICD-10-CM

## 2020-01-07 DIAGNOSIS — R197 Diarrhea, unspecified: Secondary | ICD-10-CM

## 2020-01-07 DIAGNOSIS — Z171 Estrogen receptor negative status [ER-]: Secondary | ICD-10-CM

## 2020-01-07 DIAGNOSIS — Z5111 Encounter for antineoplastic chemotherapy: Secondary | ICD-10-CM | POA: Diagnosis not present

## 2020-01-07 DIAGNOSIS — Z90710 Acquired absence of both cervix and uterus: Secondary | ICD-10-CM | POA: Diagnosis not present

## 2020-01-07 DIAGNOSIS — E669 Obesity, unspecified: Secondary | ICD-10-CM | POA: Diagnosis not present

## 2020-01-07 DIAGNOSIS — Z803 Family history of malignant neoplasm of breast: Secondary | ICD-10-CM | POA: Diagnosis not present

## 2020-01-07 DIAGNOSIS — D649 Anemia, unspecified: Secondary | ICD-10-CM | POA: Diagnosis not present

## 2020-01-07 LAB — CBC WITH DIFFERENTIAL (CANCER CENTER ONLY)
Abs Immature Granulocytes: 0.03 10*3/uL (ref 0.00–0.07)
Basophils Absolute: 0 10*3/uL (ref 0.0–0.1)
Basophils Relative: 1 %
Eosinophils Absolute: 0 10*3/uL (ref 0.0–0.5)
Eosinophils Relative: 1 %
HCT: 29.4 % — ABNORMAL LOW (ref 36.0–46.0)
Hemoglobin: 9.9 g/dL — ABNORMAL LOW (ref 12.0–15.0)
Immature Granulocytes: 1 %
Lymphocytes Relative: 15 %
Lymphs Abs: 0.6 10*3/uL — ABNORMAL LOW (ref 0.7–4.0)
MCH: 32.7 pg (ref 26.0–34.0)
MCHC: 33.7 g/dL (ref 30.0–36.0)
MCV: 97 fL (ref 80.0–100.0)
Monocytes Absolute: 0.5 10*3/uL (ref 0.1–1.0)
Monocytes Relative: 11 %
Neutro Abs: 3 10*3/uL (ref 1.7–7.7)
Neutrophils Relative %: 71 %
Platelet Count: 167 10*3/uL (ref 150–400)
RBC: 3.03 MIL/uL — ABNORMAL LOW (ref 3.87–5.11)
RDW: 16.9 % — ABNORMAL HIGH (ref 11.5–15.5)
WBC Count: 4.1 10*3/uL (ref 4.0–10.5)
nRBC: 0 % (ref 0.0–0.2)

## 2020-01-07 LAB — CMP (CANCER CENTER ONLY)
ALT: 12 U/L (ref 0–44)
AST: 13 U/L — ABNORMAL LOW (ref 15–41)
Albumin: 3.6 g/dL (ref 3.5–5.0)
Alkaline Phosphatase: 64 U/L (ref 38–126)
Anion gap: 4 — ABNORMAL LOW (ref 5–15)
BUN: 14 mg/dL (ref 8–23)
CO2: 27 mmol/L (ref 22–32)
Calcium: 9.3 mg/dL (ref 8.9–10.3)
Chloride: 106 mmol/L (ref 98–111)
Creatinine: 0.88 mg/dL (ref 0.44–1.00)
GFR, Estimated: >60 mL/min (ref >60)
Glucose, Bld: 83 mg/dL (ref 70–99)
Potassium: 4 mmol/L (ref 3.5–5.1)
Sodium: 137 mmol/L (ref 135–145)
Total Bilirubin: 0.2 mg/dL — ABNORMAL LOW (ref 0.3–1.2)
Total Protein: 6.2 g/dL — ABNORMAL LOW (ref 6.5–8.1)

## 2020-01-07 MED ORDER — DIPHENHYDRAMINE HCL 50 MG/ML IJ SOLN
INTRAMUSCULAR | Status: AC
  Filled 2020-01-07: qty 1

## 2020-01-07 MED ORDER — SODIUM CHLORIDE 0.9% FLUSH
10.0000 mL | Freq: Once | INTRAVENOUS | Status: AC
  Administered 2020-01-07: 10 mL
  Filled 2020-01-07: qty 10

## 2020-01-07 MED ORDER — SODIUM CHLORIDE 0.9 % IV SOLN
80.0000 mg/m2 | Freq: Once | INTRAVENOUS | Status: AC
  Administered 2020-01-07: 156 mg via INTRAVENOUS
  Filled 2020-01-07: qty 26

## 2020-01-07 MED ORDER — DEXAMETHASONE SODIUM PHOSPHATE 100 MG/10ML IJ SOLN
10.0000 mg | Freq: Once | INTRAMUSCULAR | Status: AC
  Administered 2020-01-07: 10 mg via INTRAVENOUS
  Filled 2020-01-07: qty 10

## 2020-01-07 MED ORDER — SODIUM CHLORIDE 0.9 % IV SOLN
Freq: Once | INTRAVENOUS | Status: AC
  Filled 2020-01-07: qty 250

## 2020-01-07 MED ORDER — SODIUM CHLORIDE 0.9% FLUSH
10.0000 mL | INTRAVENOUS | Status: DC | PRN
  Administered 2020-01-07: 10 mL
  Filled 2020-01-07: qty 10

## 2020-01-07 MED ORDER — FAMOTIDINE IN NACL 20-0.9 MG/50ML-% IV SOLN
INTRAVENOUS | Status: AC
  Filled 2020-01-07: qty 50

## 2020-01-07 MED ORDER — DIPHENHYDRAMINE HCL 50 MG/ML IJ SOLN
25.0000 mg | Freq: Once | INTRAMUSCULAR | Status: AC
  Administered 2020-01-07: 25 mg via INTRAVENOUS

## 2020-01-07 MED ORDER — PALONOSETRON HCL INJECTION 0.25 MG/5ML
INTRAVENOUS | Status: AC
  Filled 2020-01-07: qty 5

## 2020-01-07 MED ORDER — FAMOTIDINE IN NACL 20-0.9 MG/50ML-% IV SOLN
20.0000 mg | Freq: Once | INTRAVENOUS | Status: AC
  Administered 2020-01-07: 20 mg via INTRAVENOUS

## 2020-01-07 MED ORDER — PALONOSETRON HCL INJECTION 0.25 MG/5ML
0.2500 mg | Freq: Once | INTRAVENOUS | Status: AC
  Administered 2020-01-07: 0.25 mg via INTRAVENOUS

## 2020-01-07 MED ORDER — HEPARIN SOD (PORK) LOCK FLUSH 100 UNIT/ML IV SOLN
500.0000 [IU] | Freq: Once | INTRAVENOUS | Status: AC | PRN
  Administered 2020-01-07: 500 [IU]
  Filled 2020-01-07: qty 5

## 2020-01-07 MED ORDER — SODIUM CHLORIDE 0.9 % IV SOLN
150.0000 mg | Freq: Once | INTRAVENOUS | Status: AC
  Administered 2020-01-07: 150 mg via INTRAVENOUS
  Filled 2020-01-07: qty 150

## 2020-01-07 MED ORDER — CARBOPLATIN CHEMO INJECTION 450 MG/45ML
187.8000 mg | Freq: Once | INTRAVENOUS | Status: AC
  Administered 2020-01-07: 190 mg via INTRAVENOUS
  Filled 2020-01-07: qty 19

## 2020-01-07 NOTE — Patient Instructions (Signed)
[facility] Discharge Instructions for Patients Receiving Chemotherapy  Today you received the following chemotherapy agents: paclitaxel/carboplatin.  To help prevent nausea and vomiting after your treatment, we encourage you to take your nausea medication as directed.  If you develop nausea and vomiting that is not controlled by your nausea medication, call the clinic.   BELOW ARE SYMPTOMS THAT SHOULD BE REPORTED IMMEDIATELY:  *FEVER GREATER THAN 100.5 F  *CHILLS WITH OR WITHOUT FEVER  NAUSEA AND VOMITING THAT IS NOT CONTROLLED WITH YOUR NAUSEA MEDICATION  *UNUSUAL SHORTNESS OF BREATH  *UNUSUAL BRUISING OR BLEEDING  TENDERNESS IN MOUTH AND THROAT WITH OR WITHOUT PRESENCE OF ULCERS  *URINARY PROBLEMS  *BOWEL PROBLEMS  UNUSUAL RASH Items with * indicate a potential emergency and should be followed up as soon as possible.  Feel free to call the clinic should you have any questions or concerns. The clinic phone number is [phone].  Please show the CHEMO ALERT CARD at check-in to the Emergency Department and triage nurse.   

## 2020-01-14 ENCOUNTER — Inpatient Hospital Stay: Payer: Medicare HMO

## 2020-01-14 ENCOUNTER — Inpatient Hospital Stay: Payer: Medicare HMO | Attending: Oncology

## 2020-01-14 ENCOUNTER — Other Ambulatory Visit: Payer: Self-pay

## 2020-01-14 VITALS — BP 138/86 | HR 87 | Temp 97.6°F | Resp 18

## 2020-01-14 DIAGNOSIS — Z5111 Encounter for antineoplastic chemotherapy: Secondary | ICD-10-CM | POA: Insufficient documentation

## 2020-01-14 DIAGNOSIS — Z9221 Personal history of antineoplastic chemotherapy: Secondary | ICD-10-CM | POA: Insufficient documentation

## 2020-01-14 DIAGNOSIS — Z171 Estrogen receptor negative status [ER-]: Secondary | ICD-10-CM | POA: Diagnosis not present

## 2020-01-14 DIAGNOSIS — E669 Obesity, unspecified: Secondary | ICD-10-CM | POA: Diagnosis not present

## 2020-01-14 DIAGNOSIS — C50412 Malignant neoplasm of upper-outer quadrant of left female breast: Secondary | ICD-10-CM | POA: Insufficient documentation

## 2020-01-14 DIAGNOSIS — Z79899 Other long term (current) drug therapy: Secondary | ICD-10-CM | POA: Insufficient documentation

## 2020-01-14 DIAGNOSIS — R011 Cardiac murmur, unspecified: Secondary | ICD-10-CM | POA: Insufficient documentation

## 2020-01-14 DIAGNOSIS — Z923 Personal history of irradiation: Secondary | ICD-10-CM | POA: Insufficient documentation

## 2020-01-14 DIAGNOSIS — G4730 Sleep apnea, unspecified: Secondary | ICD-10-CM | POA: Insufficient documentation

## 2020-01-14 DIAGNOSIS — Z6833 Body mass index (BMI) 33.0-33.9, adult: Secondary | ICD-10-CM | POA: Insufficient documentation

## 2020-01-14 DIAGNOSIS — Z95828 Presence of other vascular implants and grafts: Secondary | ICD-10-CM

## 2020-01-14 LAB — CMP (CANCER CENTER ONLY)
ALT: 12 U/L (ref 0–44)
AST: 14 U/L — ABNORMAL LOW (ref 15–41)
Albumin: 3.7 g/dL (ref 3.5–5.0)
Alkaline Phosphatase: 62 U/L (ref 38–126)
Anion gap: 6 (ref 5–15)
BUN: 17 mg/dL (ref 8–23)
CO2: 25 mmol/L (ref 22–32)
Calcium: 9.2 mg/dL (ref 8.9–10.3)
Chloride: 106 mmol/L (ref 98–111)
Creatinine: 0.87 mg/dL (ref 0.44–1.00)
GFR, Estimated: >60 mL/min (ref >60)
Glucose, Bld: 97 mg/dL (ref 70–99)
Potassium: 3.9 mmol/L (ref 3.5–5.1)
Sodium: 137 mmol/L (ref 135–145)
Total Bilirubin: 0.3 mg/dL (ref 0.3–1.2)
Total Protein: 6.2 g/dL — ABNORMAL LOW (ref 6.5–8.1)

## 2020-01-14 LAB — CBC WITH DIFFERENTIAL (CANCER CENTER ONLY)
Abs Immature Granulocytes: 0.04 10*3/uL (ref 0.00–0.07)
Basophils Absolute: 0 10*3/uL (ref 0.0–0.1)
Basophils Relative: 1 %
Eosinophils Absolute: 0.1 10*3/uL (ref 0.0–0.5)
Eosinophils Relative: 1 %
HCT: 27.9 % — ABNORMAL LOW (ref 36.0–46.0)
Hemoglobin: 9.7 g/dL — ABNORMAL LOW (ref 12.0–15.0)
Immature Granulocytes: 1 %
Lymphocytes Relative: 15 %
Lymphs Abs: 0.6 10*3/uL — ABNORMAL LOW (ref 0.7–4.0)
MCH: 33.6 pg (ref 26.0–34.0)
MCHC: 34.8 g/dL (ref 30.0–36.0)
MCV: 96.5 fL (ref 80.0–100.0)
Monocytes Absolute: 0.4 10*3/uL (ref 0.1–1.0)
Monocytes Relative: 9 %
Neutro Abs: 3.1 10*3/uL (ref 1.7–7.7)
Neutrophils Relative %: 73 %
Platelet Count: 146 10*3/uL — ABNORMAL LOW (ref 150–400)
RBC: 2.89 MIL/uL — ABNORMAL LOW (ref 3.87–5.11)
RDW: 16.6 % — ABNORMAL HIGH (ref 11.5–15.5)
WBC Count: 4.2 10*3/uL (ref 4.0–10.5)
nRBC: 0 % (ref 0.0–0.2)

## 2020-01-14 MED ORDER — PALONOSETRON HCL INJECTION 0.25 MG/5ML
0.2500 mg | Freq: Once | INTRAVENOUS | Status: AC
  Administered 2020-01-14: 0.25 mg via INTRAVENOUS

## 2020-01-14 MED ORDER — SODIUM CHLORIDE 0.9 % IV SOLN
80.0000 mg/m2 | Freq: Once | INTRAVENOUS | Status: AC
  Administered 2020-01-14: 156 mg via INTRAVENOUS
  Filled 2020-01-14: qty 26

## 2020-01-14 MED ORDER — DIPHENHYDRAMINE HCL 50 MG/ML IJ SOLN
INTRAMUSCULAR | Status: AC
  Filled 2020-01-14: qty 1

## 2020-01-14 MED ORDER — SODIUM CHLORIDE 0.9 % IV SOLN
10.0000 mg | Freq: Once | INTRAVENOUS | Status: AC
  Administered 2020-01-14: 10 mg via INTRAVENOUS
  Filled 2020-01-14: qty 10

## 2020-01-14 MED ORDER — SODIUM CHLORIDE 0.9% FLUSH
10.0000 mL | Freq: Once | INTRAVENOUS | Status: AC
  Administered 2020-01-14: 10 mL
  Filled 2020-01-14: qty 10

## 2020-01-14 MED ORDER — PALONOSETRON HCL INJECTION 0.25 MG/5ML
INTRAVENOUS | Status: AC
  Filled 2020-01-14: qty 5

## 2020-01-14 MED ORDER — SODIUM CHLORIDE 0.9 % IV SOLN
190.0000 mg | Freq: Once | INTRAVENOUS | Status: AC
  Administered 2020-01-14: 190 mg via INTRAVENOUS
  Filled 2020-01-14: qty 19

## 2020-01-14 MED ORDER — SODIUM CHLORIDE 0.9 % IV SOLN
Freq: Once | INTRAVENOUS | Status: AC
  Filled 2020-01-14: qty 250

## 2020-01-14 MED ORDER — FAMOTIDINE IN NACL 20-0.9 MG/50ML-% IV SOLN
INTRAVENOUS | Status: AC
  Filled 2020-01-14: qty 50

## 2020-01-14 MED ORDER — SODIUM CHLORIDE 0.9% FLUSH
10.0000 mL | INTRAVENOUS | Status: DC | PRN
  Administered 2020-01-14: 10 mL
  Filled 2020-01-14: qty 10

## 2020-01-14 MED ORDER — DIPHENHYDRAMINE HCL 50 MG/ML IJ SOLN
25.0000 mg | Freq: Once | INTRAMUSCULAR | Status: AC
  Administered 2020-01-14: 25 mg via INTRAVENOUS

## 2020-01-14 MED ORDER — FAMOTIDINE IN NACL 20-0.9 MG/50ML-% IV SOLN
20.0000 mg | Freq: Once | INTRAVENOUS | Status: AC
  Administered 2020-01-14: 20 mg via INTRAVENOUS

## 2020-01-14 MED ORDER — HEPARIN SOD (PORK) LOCK FLUSH 100 UNIT/ML IV SOLN
500.0000 [IU] | Freq: Once | INTRAVENOUS | Status: AC | PRN
  Administered 2020-01-14: 500 [IU]
  Filled 2020-01-14: qty 5

## 2020-01-14 NOTE — Patient Instructions (Signed)
   [facility] Discharge Instructions for Patients Receiving Chemotherapy  Today you received the following chemotherapy agents Taxol and Carboplatin   To help prevent nausea and vomiting after your treatment, we encourage you to take your nausea medication as directed.    If you develop nausea and vomiting that is not controlled by your nausea medication, call the clinic.   BELOW ARE SYMPTOMS THAT SHOULD BE REPORTED IMMEDIATELY:  *FEVER GREATER THAN 100.5 F  *CHILLS WITH OR WITHOUT FEVER  NAUSEA AND VOMITING THAT IS NOT CONTROLLED WITH YOUR NAUSEA MEDICATION  *UNUSUAL SHORTNESS OF BREATH  *UNUSUAL BRUISING OR BLEEDING  TENDERNESS IN MOUTH AND THROAT WITH OR WITHOUT PRESENCE OF ULCERS  *URINARY PROBLEMS  *BOWEL PROBLEMS  UNUSUAL RASH Items with * indicate a potential emergency and should be followed up as soon as possible.  Feel free to call the clinic should you have any questions or concerns. The clinic phone number is [phone].  Please show the CHEMO ALERT CARD at check-in to the Emergency Department and triage nurse.   

## 2020-01-20 ENCOUNTER — Other Ambulatory Visit: Payer: Self-pay | Admitting: *Deleted

## 2020-01-20 DIAGNOSIS — Z171 Estrogen receptor negative status [ER-]: Secondary | ICD-10-CM

## 2020-01-20 DIAGNOSIS — C50412 Malignant neoplasm of upper-outer quadrant of left female breast: Secondary | ICD-10-CM

## 2020-01-20 NOTE — Progress Notes (Signed)
Cheyenne Mcdonald  Telephone:([phone]) [phone] Fax:([phone]) [phone]     ID: Cheyenne Mcdonald Mcdonald DOB: [birth year]  MR#: [id number]  ZJI#:[id number]  Patient Care Team: Cheyenne Mccreedy, MD as PCP - General (Internal Medicine) Cheyenne Mcdonald [place], MD as Consulting Physician (Obstetrics and Gynecology) Cheyenne Kaufmann, RN as Oncology Nurse Navigator Cheyenne Mcdonald [place], RN as Oncology Nurse Navigator Cheyenne Keens, MD as Consulting Physician (General Surgery) Cheyenne Mcdonald Mcdonald, Cheyenne Dad, MD as Consulting Physician (Oncology) Cheyenne Pray, MD as Consulting Physician (Radiation Oncology) Cheyenne Aguas, MD as Consulting Physician (Nephrology) Cheyenne Mcdonald Cruel, MD OTHER MD:  CHIEF COMPLAINT: triple negative breast cancer  CURRENT TREATMENT: Neoadjuvant chemotherapy   INTERVAL HISTORY: Cheyenne Mcdonald Mcdonald returns today for follow up and treatment of her triple negative breast cancer.  She is accompanied by her husband  She completed 4 cycles of Cytoxan/Adriamycin and began weekly paclitaxel and carboplatin, which will be repeated x12, on 12/17/2019. Today is week 6.  Her most recent echocardiogram from 10/17/2019 showed an ejection fraction of 60-65%.  She is not scheduled for repeat echo at this point.   REVIEW OF SYSTEMS: Cheyenne Mcdonald Mcdonald continues to tolerate treatment generally well.  She does feel fatigued and some days she just stays in her pajamas.  Occasionally she takes a nap.  She has not been walking regularly as she had been doing previously.  She is getting some hyperpigmentation and sometimes when she rubs her arms with a cough during shower the clot will look dirty like she had dirt but she did not have any particular dirt.  She has absolutely no peripheral neuropathy symptoms.  She is having some nail dyscrasias and she thinks that one of her big toenails may be coming off.  Her appetite is up and her sense of taste is improved.  A detailed review of systems today was otherwise stable.   COVID 19  VACCINATION STATUS: Status post Pfizer x2 with booster October 2021   HISTORY OF CURRENT ILLNESS: From the original intake note:  Cheyenne Mcdonald Mcdonald presented for her routine mammography with a palpable left breast lump. She underwent bilateral diagnostic mammography with tomography and left breast ultrasonography at [facility] on 09/13/2019 showing: breast density category B; two adjacent masses in left breast at 1 o'clock, spanning approximately 3.3 cm; one indeterminate left axillary lymph node with 0.4 cm cortical bulge; no evidence right breast malignancy.  Accordingly on 09/27/2019 she proceeded to biopsy of the left breast areas in question. The pathology from this procedure ([id number]) showed: invasive ductal carcinoma, grade 3, present in both of the adjacent masses. Prognostic indicators significant for: estrogen receptor, 0% negative and progesterone receptor, 0% negative. Proliferation marker Ki67 at 90%. HER2 equivocal by immunohistochemistry (2+), but negative by fluorescent in situ hybridization with a signals ratio 2.2 and number per cell 3.3. Additional analysis of more cells showed a signals ratio 2.05 and number per cell 3.13.  The biopsied lymph node was negative for carcinoma.  This was felt to be concordant  The patient's subsequent history is as detailed below.   PAST MEDICAL HISTORY: Past Medical History:  Diagnosis Date  . Breast cancer ([place])   . Obesity   . Sleep apnea   heart murmur (since childhood)   PAST SURGICAL HISTORY: Past Surgical History:  Procedure Laterality Date  . ABDOMINAL HYSTERECTOMY    . IR IMAGING GUIDED PORT INSERTION  10/18/2019    FAMILY HISTORY: Family History  Problem Relation Age of Onset  . Breast cancer Cousin 39  maternal cousin; bilateral    Her father died at age 50, cause of death unknown. Her mother is age 2 as of 09/2019. Cheyenne Mcdonald has two brothers (and no sisters). She reports one cousin with breast cancer at age  24.   GYNECOLOGIC HISTORY:  No LMP recorded. Patient has had a hysterectomy. Menarche: 70 years old Age at first live birth: 70 years old [place]yenne Mcd[place] P 2 LMP 1990 Contraceptive: used for maybe 20 years HRT: never used  Hysterectomy? Yes, 1990 BSO? 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 Husband Cheyenne Mcdonald Mcdonald is retired Nature conservation officer and then retired Civil Service fast streamer.  At home is just the 2 of them. Daughter Cheyenne Mcdonald Mcdonald, age 23, works in [employer] entry in [employer]. Son Cheyenne Mcdonald Mcdonald, age 71, has a college degree but works for [employer]. [name] has four grandchildren. She attends [employer].    ADVANCED DIRECTIVES: In place   HEALTH MAINTENANCE: Social History   Tobacco Use  . Smoking status: Never Smoker  . Smokeless tobacco: Never Used  Substance Use Topics  . Alcohol use: Never  . Drug use: Never     Colonoscopy: 2018 (Dr. Collene Mares)  PAP: approx. 2013  Bone density: 2016, "normal"   Allergies  Allergen Reactions  . Other Itching  . Shellfish Allergy Itching    Current Outpatient Medications  Medication Sig Dispense Refill  . Cholecalciferol 125 MCG (5000 UT) capsule Take by mouth.    . lidocaine-prilocaine (EMLA) cream Apply to affected area once 30 g 3  . loratadine (CLARITIN) 10 MG tablet Take 1 tablet (10 mg total) by mouth daily. 40 tablet 0  . LORazepam (ATIVAN) 0.5 MG tablet Take 1 tablet (0.5 mg total) by mouth at bedtime as needed (Nausea or vomiting). 20 tablet 0  . prochlorperazine (COMPAZINE) 10 MG tablet Take 1 tablet (10 mg total) by mouth every 6 (six) hours as needed (Nausea or vomiting). 30 tablet 1   No current facility-administered medications for this visit.    OBJECTIVE: African-American woman in no acute distress  Vitals:   01/21/20 0822  BP: 120/62  Pulse: 90  Resp: 18  Temp: (!) 97.4 F (36.3 C)  SpO2: 100%     Body mass index is 33.74 kg/m.   Wt Readings from Last 3 Encounters:   01/21/20 193 lb 8 oz (87.8 kg)  01/07/20 192 lb (87.1 kg)  12/31/19 190 lb (86.2 kg)      ECOG FS:1 - Symptomatic but completely ambulatory  Sclerae unicteric, EOMs intact Wearing a mask No cervical or supraclavicular adenopathy Lungs no rales or rhonchi Heart regular rate and rhythm Abd soft, nontender, positive bowel sounds MSK no focal spinal tenderness, no upper extremity lymphedema Neuro: nonfocal, well oriented, appropriate affect Breasts: I do not palpate a mass in the left breast.  There is no skin or nipple change of concern.   LAB RESULTS:  CMP     Component Value Date/Time   NA 137 01/14/2020 1119   K 3.9 01/14/2020 1119   CL 106 01/14/2020 1119   CO2 25 01/14/2020 1119   GLUCOSE 97 01/14/2020 1119   BUN 17 01/14/2020 1119   CREATININE 0.87 01/14/2020 1119   CALCIUM 9.2 01/14/2020 1119   PROT 6.2 (L) 01/14/2020 1119   ALBUMIN 3.7 01/14/2020 1119   AST 14 (L) 01/14/2020 1119   ALT 12 01/14/2020 1119   ALKPHOS 62 01/14/2020 1119   BILITOT 0.3 01/14/2020  [place] 01/14/2020 1119   GFRAA 58 (L) 10/09/2019 0806    No results found for: TOTALPROTELP, ALBUMINELP, A1GS, A2GS, BETS, BETA2SER, GAMS, MSPIKE, SPEI  Lab Results  Component Value Date   WBC 3.6 (L) 01/21/2020   NEUTROABS 2.6 01/21/2020   HGB 10.0 (L) 01/21/2020   HCT 29.5 (L) 01/21/2020   MCV 98.3 01/21/2020   PLT 137 (L) 01/21/2020    No results found for: LABCA2  No components found for: NIDPOE423  No results for input(s): INR in the last 168 hours.  No results found for: LABCA2  No results found for: NTI144  No results found for: RXV400  No results found for: QQP619  No results found for: CA2729  No components found for: HGQUANT  No results found for: CEA1 / No results found for: CEA1   No results found for: AFPTUMOR  No results found for: CHROMOGRNA  No results found for: KPAFRELGTCHN, LAMBDASER, KAPLAMBRATIO (kappa/lambda light chains)  No results found  for: HGBA, HGBA2QUANT, HGBFQUANT, HGBSQUAN (Hemoglobinopathy evaluation)   No results found for: LDH  No results found for: IRON, TIBC, IRONPCTSAT (Iron and TIBC)  No results found for: FERRITIN  Urinalysis No results found for: COLORURINE, APPEARANCEUR, LABSPEC, PHURINE, GLUCOSEU, HGBUR, BILIRUBINUR, KETONESUR, PROTEINUR, UROBILINOGEN, NITRITE, LEUKOCYTESUR   STUDIES: No results found.   ELIGIBLE FOR AVAILABLE RESEARCH PROTOCOL: AET  ASSESSMENT: 70 y.o. [place] woman status post left breast upper outer quadrant biopsy 09/27/2019 for a clinically T1-T2 N0, stage IA- IIA invasive ductal carcinoma, grade 3, triple negative, with an MIB-1 of 90%.  (1) genetics testing results pending  (2) neoadjuvant chemotherapy will consist of doxorubicin and cyclophosphamide in dose dense fashion x4 starting 10/22/2019 to be followed by weekly carboplatin and paclitaxel x12  (a) echo 10/17/2019 shows an ejection fraction in the 60-65% range  (3) definitive surgery to follow  (4) adjuvant radiation as appropriate   PLAN: Shyanna is doing generally quite well with the current treatment. It would be a good idea for her to exercise a little bit more and I suggested 15 minutes a day at least once, perhaps twice, and particularly perhaps around the early afternoon, since after lunch is one of the times when people feel most fatigued. It is also when it is a little bit warmer outside.  She is so far having absolutely no peripheral neuropathy. We discussed the hyperpigmentation issue and the nail dyscrasia. All those things are temporary even though they do take a while to completely clear.  I offered her a break in her treatment but she just wants to keep right on going so she can finish as planned. She is already anticipating good news on her MRI and I think she will not be disappointed  Total encounter time 25 minutes.Sarajane Jews C. Nicha Hemann, MD 01/21/2020 8:32 AM Medical Oncology and  Hematology [facility] [place], [place] [address] Tel. [phone]    Fax. [phone]   This document serves as a record of services personally performed by Lurline Del, MD. It was created on his behalf by Wilburn Mylar, a trained medical scribe. The creation of this record is based on the scribe's personal observations and the provider's statements to them.   I, Lurline Del MD, have reviewed the above documentation for accuracy and completeness, and I agree with the above.   *Total Encounter Time as defined by the Centers for Medicare and Medicaid Services includes, in addition to the face-to-face  time of a patient visit (documented in the note above) non-face-to-face time: obtaining and reviewing outside history, ordering and reviewing medications, tests or procedures, care coordination (communications with other health care professionals or caregivers) and documentation in the medical record.

## 2020-01-21 ENCOUNTER — Inpatient Hospital Stay: Payer: Medicare HMO | Admitting: Oncology

## 2020-01-21 ENCOUNTER — Other Ambulatory Visit: Payer: Medicare HMO

## 2020-01-21 ENCOUNTER — Inpatient Hospital Stay: Payer: Medicare HMO

## 2020-01-21 ENCOUNTER — Other Ambulatory Visit: Payer: Self-pay

## 2020-01-21 ENCOUNTER — Ambulatory Visit: Payer: Medicare HMO

## 2020-01-21 ENCOUNTER — Encounter: Payer: Self-pay | Admitting: *Deleted

## 2020-01-21 VITALS — BP 120/62 | HR 90 | Temp 97.4°F | Resp 18 | Ht 63.5 in | Wt 193.5 lb

## 2020-01-21 DIAGNOSIS — Z5111 Encounter for antineoplastic chemotherapy: Secondary | ICD-10-CM | POA: Diagnosis not present

## 2020-01-21 DIAGNOSIS — Z171 Estrogen receptor negative status [ER-]: Secondary | ICD-10-CM | POA: Diagnosis not present

## 2020-01-21 DIAGNOSIS — Z95828 Presence of other vascular implants and grafts: Secondary | ICD-10-CM

## 2020-01-21 DIAGNOSIS — C50412 Malignant neoplasm of upper-outer quadrant of left female breast: Secondary | ICD-10-CM

## 2020-01-21 DIAGNOSIS — E669 Obesity, unspecified: Secondary | ICD-10-CM | POA: Diagnosis not present

## 2020-01-21 DIAGNOSIS — Z923 Personal history of irradiation: Secondary | ICD-10-CM | POA: Diagnosis not present

## 2020-01-21 DIAGNOSIS — G4730 Sleep apnea, unspecified: Secondary | ICD-10-CM | POA: Diagnosis not present

## 2020-01-21 DIAGNOSIS — R011 Cardiac murmur, unspecified: Secondary | ICD-10-CM | POA: Diagnosis not present

## 2020-01-21 DIAGNOSIS — Z9221 Personal history of antineoplastic chemotherapy: Secondary | ICD-10-CM | POA: Diagnosis not present

## 2020-01-21 DIAGNOSIS — Z6833 Body mass index (BMI) 33.0-33.9, adult: Secondary | ICD-10-CM | POA: Diagnosis not present

## 2020-01-21 LAB — CMP (CANCER CENTER ONLY)
ALT: 11 U/L (ref 0–44)
AST: 14 U/L — ABNORMAL LOW (ref 15–41)
Albumin: 3.7 g/dL (ref 3.5–5.0)
Alkaline Phosphatase: 59 U/L (ref 38–126)
Anion gap: 9 (ref 5–15)
BUN: 13 mg/dL (ref 8–23)
CO2: 24 mmol/L (ref 22–32)
Calcium: 9.3 mg/dL (ref 8.9–10.3)
Chloride: 108 mmol/L (ref 98–111)
Creatinine: 0.85 mg/dL (ref 0.44–1.00)
GFR, Estimated: >60 mL/min (ref >60)
Glucose, Bld: 88 mg/dL (ref 70–99)
Potassium: 3.9 mmol/L (ref 3.5–5.1)
Sodium: 141 mmol/L (ref 135–145)
Total Bilirubin: 0.4 mg/dL (ref 0.3–1.2)
Total Protein: 6.3 g/dL — ABNORMAL LOW (ref 6.5–8.1)

## 2020-01-21 LAB — CBC WITH DIFFERENTIAL (CANCER CENTER ONLY)
Abs Immature Granulocytes: 0.03 10*3/uL (ref 0.00–0.07)
Basophils Absolute: 0 10*3/uL (ref 0.0–0.1)
Basophils Relative: 1 %
Eosinophils Absolute: 0 10*3/uL (ref 0.0–0.5)
Eosinophils Relative: 1 %
HCT: 29.5 % — ABNORMAL LOW (ref 36.0–46.0)
Hemoglobin: 10 g/dL — ABNORMAL LOW (ref 12.0–15.0)
Immature Granulocytes: 1 %
Lymphocytes Relative: 17 %
Lymphs Abs: 0.6 10*3/uL — ABNORMAL LOW (ref 0.7–4.0)
MCH: 33.3 pg (ref 26.0–34.0)
MCHC: 33.9 g/dL (ref 30.0–36.0)
MCV: 98.3 fL (ref 80.0–100.0)
Monocytes Absolute: 0.3 10*3/uL (ref 0.1–1.0)
Monocytes Relative: 9 %
Neutro Abs: 2.6 10*3/uL (ref 1.7–7.7)
Neutrophils Relative %: 71 %
Platelet Count: 137 10*3/uL — ABNORMAL LOW (ref 150–400)
RBC: 3 MIL/uL — ABNORMAL LOW (ref 3.87–5.11)
RDW: 15.9 % — ABNORMAL HIGH (ref 11.5–15.5)
WBC Count: 3.6 10*3/uL — ABNORMAL LOW (ref 4.0–10.5)
nRBC: 0 % (ref 0.0–0.2)

## 2020-01-21 MED ORDER — SODIUM CHLORIDE 0.9 % IV SOLN
10.0000 mg | Freq: Once | INTRAVENOUS | Status: AC
  Administered 2020-01-21: 10 mg via INTRAVENOUS
  Filled 2020-01-21: qty 10

## 2020-01-21 MED ORDER — DIPHENHYDRAMINE HCL 50 MG/ML IJ SOLN
INTRAMUSCULAR | Status: AC
  Filled 2020-01-21: qty 1

## 2020-01-21 MED ORDER — SODIUM CHLORIDE 0.9 % IV SOLN
80.0000 mg/m2 | Freq: Once | INTRAVENOUS | Status: AC
  Administered 2020-01-21: 156 mg via INTRAVENOUS
  Filled 2020-01-21: qty 26

## 2020-01-21 MED ORDER — SODIUM CHLORIDE 0.9% FLUSH
10.0000 mL | INTRAVENOUS | Status: DC | PRN
  Administered 2020-01-21: 10 mL
  Filled 2020-01-21: qty 10

## 2020-01-21 MED ORDER — FAMOTIDINE IN NACL 20-0.9 MG/50ML-% IV SOLN
20.0000 mg | Freq: Once | INTRAVENOUS | Status: AC
  Administered 2020-01-21: 20 mg via INTRAVENOUS

## 2020-01-21 MED ORDER — SODIUM CHLORIDE 0.9 % IV SOLN
187.8000 mg | Freq: Once | INTRAVENOUS | Status: AC
  Administered 2020-01-21: 190 mg via INTRAVENOUS
  Filled 2020-01-21: qty 19

## 2020-01-21 MED ORDER — PALONOSETRON HCL INJECTION 0.25 MG/5ML
0.2500 mg | Freq: Once | INTRAVENOUS | Status: AC
  Administered 2020-01-21: 0.25 mg via INTRAVENOUS

## 2020-01-21 MED ORDER — HEPARIN SOD (PORK) LOCK FLUSH 100 UNIT/ML IV SOLN
500.0000 [IU] | Freq: Once | INTRAVENOUS | Status: AC | PRN
  Administered 2020-01-21: 500 [IU]
  Filled 2020-01-21: qty 5

## 2020-01-21 MED ORDER — SODIUM CHLORIDE 0.9% FLUSH
10.0000 mL | Freq: Once | INTRAVENOUS | Status: AC
  Administered 2020-01-21: 10 mL
  Filled 2020-01-21: qty 10

## 2020-01-21 MED ORDER — DIPHENHYDRAMINE HCL 50 MG/ML IJ SOLN
25.0000 mg | Freq: Once | INTRAMUSCULAR | Status: AC
  Administered 2020-01-21: 25 mg via INTRAVENOUS

## 2020-01-21 MED ORDER — FAMOTIDINE IN NACL 20-0.9 MG/50ML-% IV SOLN
INTRAVENOUS | Status: AC
  Filled 2020-01-21: qty 50

## 2020-01-21 MED ORDER — PALONOSETRON HCL INJECTION 0.25 MG/5ML
INTRAVENOUS | Status: AC
  Filled 2020-01-21: qty 5

## 2020-01-21 MED ORDER — SODIUM CHLORIDE 0.9 % IV SOLN
Freq: Once | INTRAVENOUS | Status: AC
  Filled 2020-01-21: qty 250

## 2020-01-21 NOTE — Patient Instructions (Signed)
   [facility] Discharge Instructions for Patients Receiving Chemotherapy  Today you received the following chemotherapy agents Taxol and Carboplatin   To help prevent nausea and vomiting after your treatment, we encourage you to take your nausea medication as directed.    If you develop nausea and vomiting that is not controlled by your nausea medication, call the clinic.   BELOW ARE SYMPTOMS THAT SHOULD BE REPORTED IMMEDIATELY:  *FEVER GREATER THAN 100.5 F  *CHILLS WITH OR WITHOUT FEVER  NAUSEA AND VOMITING THAT IS NOT CONTROLLED WITH YOUR NAUSEA MEDICATION  *UNUSUAL SHORTNESS OF BREATH  *UNUSUAL BRUISING OR BLEEDING  TENDERNESS IN MOUTH AND THROAT WITH OR WITHOUT PRESENCE OF ULCERS  *URINARY PROBLEMS  *BOWEL PROBLEMS  UNUSUAL RASH Items with * indicate a potential emergency and should be followed up as soon as possible.  Feel free to call the clinic should you have any questions or concerns. The clinic phone number is [phone].  Please show the CHEMO ALERT CARD at check-in to the Emergency Department and triage nurse.   

## 2020-01-28 ENCOUNTER — Other Ambulatory Visit: Payer: Self-pay | Admitting: *Deleted

## 2020-01-28 ENCOUNTER — Other Ambulatory Visit: Payer: Self-pay

## 2020-01-28 ENCOUNTER — Inpatient Hospital Stay: Payer: Medicare HMO

## 2020-01-28 VITALS — BP 120/62 | HR 90 | Temp 98.0°F | Resp 18 | Wt 191.0 lb

## 2020-01-28 DIAGNOSIS — C50412 Malignant neoplasm of upper-outer quadrant of left female breast: Secondary | ICD-10-CM

## 2020-01-28 DIAGNOSIS — Z923 Personal history of irradiation: Secondary | ICD-10-CM | POA: Diagnosis not present

## 2020-01-28 DIAGNOSIS — Z9221 Personal history of antineoplastic chemotherapy: Secondary | ICD-10-CM | POA: Diagnosis not present

## 2020-01-28 DIAGNOSIS — Z95828 Presence of other vascular implants and grafts: Secondary | ICD-10-CM

## 2020-01-28 DIAGNOSIS — Z171 Estrogen receptor negative status [ER-]: Secondary | ICD-10-CM

## 2020-01-28 DIAGNOSIS — Z6833 Body mass index (BMI) 33.0-33.9, adult: Secondary | ICD-10-CM | POA: Diagnosis not present

## 2020-01-28 DIAGNOSIS — E669 Obesity, unspecified: Secondary | ICD-10-CM | POA: Diagnosis not present

## 2020-01-28 DIAGNOSIS — G4730 Sleep apnea, unspecified: Secondary | ICD-10-CM | POA: Diagnosis not present

## 2020-01-28 DIAGNOSIS — Z5111 Encounter for antineoplastic chemotherapy: Secondary | ICD-10-CM | POA: Diagnosis not present

## 2020-01-28 DIAGNOSIS — R011 Cardiac murmur, unspecified: Secondary | ICD-10-CM | POA: Diagnosis not present

## 2020-01-28 LAB — CBC WITH DIFFERENTIAL (CANCER CENTER ONLY)
Abs Immature Granulocytes: 0.02 10*3/uL (ref 0.00–0.07)
Basophils Absolute: 0 10*3/uL (ref 0.0–0.1)
Basophils Relative: 1 %
Eosinophils Absolute: 0 10*3/uL (ref 0.0–0.5)
Eosinophils Relative: 0 %
HCT: 28.5 % — ABNORMAL LOW (ref 36.0–46.0)
Hemoglobin: 9.9 g/dL — ABNORMAL LOW (ref 12.0–15.0)
Immature Granulocytes: 1 %
Lymphocytes Relative: 16 %
Lymphs Abs: 0.6 10*3/uL — ABNORMAL LOW (ref 0.7–4.0)
MCH: 34.6 pg — ABNORMAL HIGH (ref 26.0–34.0)
MCHC: 34.7 g/dL (ref 30.0–36.0)
MCV: 99.7 fL (ref 80.0–100.0)
Monocytes Absolute: 0.4 10*3/uL (ref 0.1–1.0)
Monocytes Relative: 12 %
Neutro Abs: 2.4 10*3/uL (ref 1.7–7.7)
Neutrophils Relative %: 70 %
Platelet Count: 156 10*3/uL (ref 150–400)
RBC: 2.86 MIL/uL — ABNORMAL LOW (ref 3.87–5.11)
RDW: 14.8 % (ref 11.5–15.5)
WBC Count: 3.4 10*3/uL — ABNORMAL LOW (ref 4.0–10.5)
nRBC: 0 % (ref 0.0–0.2)

## 2020-01-28 LAB — CMP (CANCER CENTER ONLY)
ALT: 9 U/L (ref 0–44)
AST: 12 U/L — ABNORMAL LOW (ref 15–41)
Albumin: 3.7 g/dL (ref 3.5–5.0)
Alkaline Phosphatase: 56 U/L (ref 38–126)
Anion gap: 5 (ref 5–15)
BUN: 13 mg/dL (ref 8–23)
CO2: 25 mmol/L (ref 22–32)
Calcium: 9 mg/dL (ref 8.9–10.3)
Chloride: 108 mmol/L (ref 98–111)
Creatinine: 0.92 mg/dL (ref 0.44–1.00)
GFR, Estimated: >60 mL/min (ref >60)
Glucose, Bld: 95 mg/dL (ref 70–99)
Potassium: 3.9 mmol/L (ref 3.5–5.1)
Sodium: 138 mmol/L (ref 135–145)
Total Bilirubin: 0.2 mg/dL — ABNORMAL LOW (ref 0.3–1.2)
Total Protein: 6.3 g/dL — ABNORMAL LOW (ref 6.5–8.1)

## 2020-01-28 MED ORDER — HEPARIN SOD (PORK) LOCK FLUSH 100 UNIT/ML IV SOLN
500.0000 [IU] | Freq: Once | INTRAVENOUS | Status: AC | PRN
  Administered 2020-01-28: 500 [IU]
  Filled 2020-01-28: qty 5

## 2020-01-28 MED ORDER — PALONOSETRON HCL INJECTION 0.25 MG/5ML
INTRAVENOUS | Status: AC
  Filled 2020-01-28: qty 5

## 2020-01-28 MED ORDER — SODIUM CHLORIDE 0.9 % IV SOLN
10.0000 mg | Freq: Once | INTRAVENOUS | Status: AC
  Administered 2020-01-28: 10 mg via INTRAVENOUS
  Filled 2020-01-28: qty 10

## 2020-01-28 MED ORDER — SODIUM CHLORIDE 0.9% FLUSH
10.0000 mL | INTRAVENOUS | Status: DC | PRN
  Administered 2020-01-28: 10 mL
  Filled 2020-01-28: qty 10

## 2020-01-28 MED ORDER — SODIUM CHLORIDE 0.9 % IV SOLN
Freq: Once | INTRAVENOUS | Status: AC
  Filled 2020-01-28: qty 250

## 2020-01-28 MED ORDER — DIPHENHYDRAMINE HCL 50 MG/ML IJ SOLN
25.0000 mg | Freq: Once | INTRAMUSCULAR | Status: AC
  Administered 2020-01-28: 25 mg via INTRAVENOUS

## 2020-01-28 MED ORDER — DIPHENHYDRAMINE HCL 50 MG/ML IJ SOLN
INTRAMUSCULAR | Status: AC
  Filled 2020-01-28: qty 1

## 2020-01-28 MED ORDER — FAMOTIDINE IN NACL 20-0.9 MG/50ML-% IV SOLN
INTRAVENOUS | Status: AC
  Filled 2020-01-28: qty 50

## 2020-01-28 MED ORDER — CARBOPLATIN CHEMO INJECTION 450 MG/45ML
187.8000 mg | Freq: Once | INTRAVENOUS | Status: AC
  Administered 2020-01-28: 190 mg via INTRAVENOUS
  Filled 2020-01-28: qty 19

## 2020-01-28 MED ORDER — FAMOTIDINE IN NACL 20-0.9 MG/50ML-% IV SOLN
20.0000 mg | Freq: Once | INTRAVENOUS | Status: AC
  Administered 2020-01-28: 20 mg via INTRAVENOUS

## 2020-01-28 MED ORDER — SODIUM CHLORIDE 0.9 % IV SOLN
80.0000 mg/m2 | Freq: Once | INTRAVENOUS | Status: AC
  Administered 2020-01-28: 156 mg via INTRAVENOUS
  Filled 2020-01-28: qty 26

## 2020-01-28 MED ORDER — SODIUM CHLORIDE 0.9% FLUSH
10.0000 mL | Freq: Once | INTRAVENOUS | Status: AC
  Administered 2020-01-28: 10 mL
  Filled 2020-01-28: qty 10

## 2020-01-28 MED ORDER — PALONOSETRON HCL INJECTION 0.25 MG/5ML
0.2500 mg | Freq: Once | INTRAVENOUS | Status: AC
  Administered 2020-01-28: 0.25 mg via INTRAVENOUS

## 2020-01-28 NOTE — Patient Instructions (Signed)

## 2020-01-28 NOTE — Patient Instructions (Addendum)
[facility] Discharge Instructions for Patients Receiving Chemotherapy  Today you received the following chemotherapy agents Taxol and Carboplatin  To help prevent nausea and vomiting after your treatment, we encourage you to take your nausea medication as directed.  If you develop nausea and vomiting that is not controlled by your nausea medication, call the clinic.   BELOW ARE SYMPTOMS THAT SHOULD BE REPORTED IMMEDIATELY:  *FEVER GREATER THAN 100.5 F  *CHILLS WITH OR WITHOUT FEVER  NAUSEA AND VOMITING THAT IS NOT CONTROLLED WITH YOUR NAUSEA MEDICATION  *UNUSUAL SHORTNESS OF BREATH  *UNUSUAL BRUISING OR BLEEDING  TENDERNESS IN MOUTH AND THROAT WITH OR WITHOUT PRESENCE OF ULCERS  *URINARY PROBLEMS  *BOWEL PROBLEMS  UNUSUAL RASH Items with * indicate a potential emergency and should be followed up as soon as possible.  Feel free to call the clinic should you have any questions or concerns. The clinic phone number is [phone].  Please show the CHEMO ALERT CARD at check-in to the Emergency Department and triage nurse.  Paclitaxel injection What is this medicine? PACLITAXEL (PAK li TAX el) is a chemotherapy drug. It targets fast dividing cells, like cancer cells, and causes these cells to die. This medicine is used to treat ovarian cancer, breast cancer, lung cancer, Kaposi's sarcoma, and other cancers. This medicine may be used for other purposes; ask your health care provider or pharmacist if you have questions. COMMON BRAND NAME(S): Onxol, Taxol What should I tell my health care provider before I take this medicine? They need to know if you have any of these conditions:  history of irregular heartbeat  liver disease  low blood counts, like low white cell, platelet, or red cell counts  lung or breathing disease, like asthma  tingling of the fingers or toes, or other nerve disorder  an unusual or allergic reaction to paclitaxel, alcohol,  polyoxyethylated castor oil, other chemotherapy, other medicines, foods, dyes, or preservatives  pregnant or trying to get pregnant  breast-feeding How should I use this medicine? This drug is given as an infusion into a vein. It is administered in a hospital or clinic by a specially trained health care professional. Talk to your pediatrician regarding the use of this medicine in children. Special care may be needed. Overdosage: If you think you have taken too much of this medicine contact a poison control center or emergency room at once. NOTE: This medicine is only for you. Do not share this medicine with others. What if I miss a dose? It is important not to miss your dose. Call your doctor or health care professional if you are unable to keep an appointment. What may interact with this medicine? Do not take this medicine with any of the following medications:  live virus vaccines This medicine may also interact with the following medications:  antiviral medicines for hepatitis, HIV or AIDS  certain antibiotics like erythromycin and clarithromycin  certain medicines for fungal infections like ketoconazole and itraconazole  certain medicines for seizures like carbamazepine, phenobarbital, phenytoin  gemfibrozil  nefazodone  rifampin  St. John's wort This list may not describe all possible interactions. Give your health care provider a list of all the medicines, herbs, non-prescription drugs, or dietary supplements you use. Also tell them if you smoke, drink alcohol, or use illegal drugs. Some items may interact with your medicine. What should I watch for while using this medicine? Your condition will be monitored carefully while you are receiving this medicine. You will need important blood   work done while you are taking this medicine. This medicine can cause serious allergic reactions. To reduce your risk you will need to take other medicine(s) before treatment with this  medicine. If you experience allergic reactions like skin rash, itching or hives, swelling of the face, lips, or tongue, tell your doctor or health care professional right away. In some cases, you may be given additional medicines to help with side effects. Follow all directions for their use. This drug may make you feel generally unwell. This is not uncommon, as chemotherapy can affect healthy cells as well as cancer cells. Report any side effects. Continue your course of treatment even though you feel ill unless your doctor tells you to stop. Call your doctor or health care professional for advice if you get a fever, chills or sore throat, or other symptoms of a cold or flu. Do not treat yourself. This drug decreases your body's ability to fight infections. Try to avoid being around people who are sick. This medicine may increase your risk to bruise or bleed. Call your doctor or health care professional if you notice any unusual bleeding. Be careful brushing and flossing your teeth or using a toothpick because you may get an infection or bleed more easily. If you have any dental work done, tell your dentist you are receiving this medicine. Avoid taking products that contain aspirin, acetaminophen, ibuprofen, naproxen, or ketoprofen unless instructed by your doctor. These medicines may hide a fever. Do not become pregnant while taking this medicine. Women should inform their doctor if they wish to become pregnant or think they might be pregnant. There is a potential for serious side effects to an unborn child. Talk to your health care professional or pharmacist for more information. Do not breast-feed an infant while taking this medicine. Men are advised not to father a child while receiving this medicine. This product may contain alcohol. Ask your pharmacist or healthcare provider if this medicine contains alcohol. Be sure to tell all healthcare providers you are taking this medicine. Certain medicines,  like metronidazole and disulfiram, can cause an unpleasant reaction when taken with alcohol. The reaction includes flushing, headache, nausea, vomiting, sweating, and increased thirst. The reaction can last from 30 minutes to several hours. What side effects may I notice from receiving this medicine? Side effects that you should report to your doctor or health care professional as soon as possible:  allergic reactions like skin rash, itching or hives, swelling of the face, lips, or tongue  breathing problems  changes in vision  fast, irregular heartbeat  high or low blood pressure  mouth sores  pain, tingling, numbness in the hands or feet  signs of decreased platelets or bleeding - bruising, pinpoint red spots on the skin, black, tarry stools, blood in the urine  signs of decreased red blood cells - unusually weak or tired, feeling faint or lightheaded, falls  signs of infection - fever or chills, cough, sore throat, pain or difficulty passing urine  signs and symptoms of liver injury like dark yellow or brown urine; general ill feeling or flu-like symptoms; light-colored stools; loss of appetite; nausea; right upper belly pain; unusually weak or tired; yellowing of the eyes or skin  swelling of the ankles, feet, hands  unusually slow heartbeat Side effects that usually do not require medical attention (report to your doctor or health care professional if they continue or are bothersome):  diarrhea  hair loss  loss of appetite  muscle or joint pain    nausea, vomiting  pain, redness, or irritation at site where injected  tiredness This list may not describe all possible side effects. Call your doctor for medical advice about side effects. You may report side effects to FDA at 1-800-FDA-1088. Where should I keep my medicine? This drug is given in a hospital or clinic and will not be stored at home. NOTE: This sheet is a summary. It may not cover all possible information.  If you have questions about this medicine, talk to your doctor, pharmacist, or health care provider.  2021 Elsevier/Gold Standard (2018-11-28 13:37:23)  Carboplatin injection What is this medicine? CARBOPLATIN (KAR boe pla tin) is a chemotherapy drug. It targets fast dividing cells, like cancer cells, and causes these cells to die. This medicine is used to treat ovarian cancer and many other cancers. This medicine may be used for other purposes; ask your health care provider or pharmacist if you have questions. COMMON BRAND NAME(S): Paraplatin What should I tell my health care provider before I take this medicine? They need to know if you have any of these conditions:  blood disorders  hearing problems  kidney disease  recent or ongoing radiation therapy  an unusual or allergic reaction to carboplatin, cisplatin, other chemotherapy, other medicines, foods, dyes, or preservatives  pregnant or trying to get pregnant  breast-feeding How should I use this medicine? This drug is usually given as an infusion into a vein. It is administered in a hospital or clinic by a specially trained health care professional. Talk to your pediatrician regarding the use of this medicine in children. Special care may be needed. Overdosage: If you think you have taken too much of this medicine contact a poison control center or emergency room at once. NOTE: This medicine is only for you. Do not share this medicine with others. What if I miss a dose? It is important not to miss a dose. Call your doctor or health care professional if you are unable to keep an appointment. What may interact with this medicine?  medicines for seizures  medicines to increase blood counts like filgrastim, pegfilgrastim, sargramostim  some antibiotics like amikacin, gentamicin, neomycin, streptomycin, tobramycin  vaccines Talk to your doctor or health care professional before taking any of these  medicines:  acetaminophen  aspirin  ibuprofen  ketoprofen  naproxen This list may not describe all possible interactions. Give your health care provider a list of all the medicines, herbs, non-prescription drugs, or dietary supplements you use. Also tell them if you smoke, drink alcohol, or use illegal drugs. Some items may interact with your medicine. What should I watch for while using this medicine? Your condition will be monitored carefully while you are receiving this medicine. You will need important blood work done while you are taking this medicine. This drug may make you feel generally unwell. This is not uncommon, as chemotherapy can affect healthy cells as well as cancer cells. Report any side effects. Continue your course of treatment even though you feel ill unless your doctor tells you to stop. In some cases, you may be given additional medicines to help with side effects. Follow all directions for their use. Call your doctor or health care professional for advice if you get a fever, chills or sore throat, or other symptoms of a cold or flu. Do not treat yourself. This drug decreases your body's ability to fight infections. Try to avoid being around people who are sick. This medicine may increase your risk to bruise or bleed.   Call your doctor or health care professional if you notice any unusual bleeding. Be careful brushing and flossing your teeth or using a toothpick because you may get an infection or bleed more easily. If you have any dental work done, tell your dentist you are receiving this medicine. Avoid taking products that contain aspirin, acetaminophen, ibuprofen, naproxen, or ketoprofen unless instructed by your doctor. These medicines may hide a fever. Do not become pregnant while taking this medicine. Women should inform their doctor if they wish to become pregnant or think they might be pregnant. There is a potential for serious side effects to an unborn child. Talk  to your health care professional or pharmacist for more information. Do not breast-feed an infant while taking this medicine. What side effects may I notice from receiving this medicine? Side effects that you should report to your doctor or health care professional as soon as possible:  allergic reactions like skin rash, itching or hives, swelling of the face, lips, or tongue  signs of infection - fever or chills, cough, sore throat, pain or difficulty passing urine  signs of decreased platelets or bleeding - bruising, pinpoint red spots on the skin, black, tarry stools, nosebleeds  signs of decreased red blood cells - unusually weak or tired, fainting spells, lightheadedness  breathing problems  changes in hearing  changes in vision  chest pain  high blood pressure  low blood counts - This drug may decrease the number of white blood cells, red blood cells and platelets. You may be at increased risk for infections and bleeding.  nausea and vomiting  pain, swelling, redness or irritation at the injection site  pain, tingling, numbness in the hands or feet  problems with balance, talking, walking  trouble passing urine or change in the amount of urine Side effects that usually do not require medical attention (report to your doctor or health care professional if they continue or are bothersome):  hair loss  loss of appetite  metallic taste in the mouth or changes in taste This list may not describe all possible side effects. Call your doctor for medical advice about side effects. You may report side effects to FDA at 1-800-FDA-1088. Where should I keep my medicine? This drug is given in a hospital or clinic and will not be stored at home. NOTE: This sheet is a summary. It may not cover all possible information. If you have questions about this medicine, talk to your doctor, pharmacist, or health care provider.  2021 Elsevier/Gold Standard (2007-04-03 14:38:05)  

## 2020-02-03 ENCOUNTER — Other Ambulatory Visit: Payer: Self-pay | Admitting: Nurse Practitioner

## 2020-02-03 DIAGNOSIS — C50412 Malignant neoplasm of upper-outer quadrant of left female breast: Secondary | ICD-10-CM

## 2020-02-03 DIAGNOSIS — Z171 Estrogen receptor negative status [ER-]: Secondary | ICD-10-CM

## 2020-02-04 ENCOUNTER — Other Ambulatory Visit: Payer: Self-pay

## 2020-02-04 ENCOUNTER — Inpatient Hospital Stay: Payer: Medicare HMO

## 2020-02-04 ENCOUNTER — Inpatient Hospital Stay: Payer: Medicare HMO | Admitting: Medical

## 2020-02-04 VITALS — BP 124/70 | HR 94 | Temp 97.6°F | Resp 17 | Ht 63.5 in | Wt 192.7 lb

## 2020-02-04 DIAGNOSIS — Z171 Estrogen receptor negative status [ER-]: Secondary | ICD-10-CM

## 2020-02-04 DIAGNOSIS — C50412 Malignant neoplasm of upper-outer quadrant of left female breast: Secondary | ICD-10-CM

## 2020-02-04 DIAGNOSIS — G4730 Sleep apnea, unspecified: Secondary | ICD-10-CM | POA: Diagnosis not present

## 2020-02-04 DIAGNOSIS — Z9221 Personal history of antineoplastic chemotherapy: Secondary | ICD-10-CM | POA: Diagnosis not present

## 2020-02-04 DIAGNOSIS — Z6833 Body mass index (BMI) 33.0-33.9, adult: Secondary | ICD-10-CM | POA: Diagnosis not present

## 2020-02-04 DIAGNOSIS — Z95828 Presence of other vascular implants and grafts: Secondary | ICD-10-CM

## 2020-02-04 DIAGNOSIS — Z5111 Encounter for antineoplastic chemotherapy: Secondary | ICD-10-CM | POA: Diagnosis not present

## 2020-02-04 DIAGNOSIS — E669 Obesity, unspecified: Secondary | ICD-10-CM | POA: Diagnosis not present

## 2020-02-04 DIAGNOSIS — Z923 Personal history of irradiation: Secondary | ICD-10-CM | POA: Diagnosis not present

## 2020-02-04 DIAGNOSIS — G620 Drug-induced polyneuropathy: Secondary | ICD-10-CM | POA: Diagnosis not present

## 2020-02-04 DIAGNOSIS — R011 Cardiac murmur, unspecified: Secondary | ICD-10-CM | POA: Diagnosis not present

## 2020-02-04 LAB — CMP (CANCER CENTER ONLY)
ALT: 10 U/L (ref 0–44)
AST: 14 U/L — ABNORMAL LOW (ref 15–41)
Albumin: 3.7 g/dL (ref 3.5–5.0)
Alkaline Phosphatase: 61 U/L (ref 38–126)
Anion gap: 7 (ref 5–15)
BUN: 14 mg/dL (ref 8–23)
CO2: 28 mmol/L (ref 22–32)
Calcium: 8.9 mg/dL (ref 8.9–10.3)
Chloride: 107 mmol/L (ref 98–111)
Creatinine: 0.93 mg/dL (ref 0.44–1.00)
GFR, Estimated: >60 mL/min (ref >60)
Glucose, Bld: 86 mg/dL (ref 70–99)
Potassium: 3.8 mmol/L (ref 3.5–5.1)
Sodium: 142 mmol/L (ref 135–145)
Total Bilirubin: 0.3 mg/dL (ref 0.3–1.2)
Total Protein: 6.3 g/dL — ABNORMAL LOW (ref 6.5–8.1)

## 2020-02-04 LAB — CBC WITH DIFFERENTIAL (CANCER CENTER ONLY)
Abs Immature Granulocytes: 0.02 10*3/uL (ref 0.00–0.07)
Basophils Absolute: 0 10*3/uL (ref 0.0–0.1)
Basophils Relative: 1 %
Eosinophils Absolute: 0 10*3/uL (ref 0.0–0.5)
Eosinophils Relative: 1 %
HCT: 29.2 % — ABNORMAL LOW (ref 36.0–46.0)
Hemoglobin: 10 g/dL — ABNORMAL LOW (ref 12.0–15.0)
Immature Granulocytes: 1 %
Lymphocytes Relative: 24 %
Lymphs Abs: 0.5 10*3/uL — ABNORMAL LOW (ref 0.7–4.0)
MCH: 34.5 pg — ABNORMAL HIGH (ref 26.0–34.0)
MCHC: 34.2 g/dL (ref 30.0–36.0)
MCV: 100.7 fL — ABNORMAL HIGH (ref 80.0–100.0)
Monocytes Absolute: 0.3 10*3/uL (ref 0.1–1.0)
Monocytes Relative: 12 %
Neutro Abs: 1.3 10*3/uL — ABNORMAL LOW (ref 1.7–7.7)
Neutrophils Relative %: 61 %
Platelet Count: 144 10*3/uL — ABNORMAL LOW (ref 150–400)
RBC: 2.9 MIL/uL — ABNORMAL LOW (ref 3.87–5.11)
RDW: 13.9 % (ref 11.5–15.5)
WBC Count: 2.2 10*3/uL — ABNORMAL LOW (ref 4.0–10.5)
nRBC: 0 % (ref 0.0–0.2)

## 2020-02-04 MED ORDER — HEPARIN SOD (PORK) LOCK FLUSH 100 UNIT/ML IV SOLN
500.0000 [IU] | Freq: Once | INTRAVENOUS | Status: AC | PRN
  Administered 2020-02-04: 500 [IU]
  Filled 2020-02-04: qty 5

## 2020-02-04 MED ORDER — SODIUM CHLORIDE 0.9 % IV SOLN
Freq: Once | INTRAVENOUS | Status: AC
  Filled 2020-02-04: qty 250

## 2020-02-04 MED ORDER — SODIUM CHLORIDE 0.9% FLUSH
10.0000 mL | Freq: Once | INTRAVENOUS | Status: AC
  Administered 2020-02-04: 10 mL
  Filled 2020-02-04: qty 10

## 2020-02-04 MED ORDER — SODIUM CHLORIDE 0.9 % IV SOLN
10.0000 mg | Freq: Once | INTRAVENOUS | Status: AC
  Administered 2020-02-04: 10 mg via INTRAVENOUS
  Filled 2020-02-04: qty 10

## 2020-02-04 MED ORDER — FAMOTIDINE IN NACL 20-0.9 MG/50ML-% IV SOLN
20.0000 mg | Freq: Once | INTRAVENOUS | Status: AC
  Administered 2020-02-04: 20 mg via INTRAVENOUS

## 2020-02-04 MED ORDER — DIPHENHYDRAMINE HCL 50 MG/ML IJ SOLN
25.0000 mg | Freq: Once | INTRAMUSCULAR | Status: AC
  Administered 2020-02-04: 25 mg via INTRAVENOUS

## 2020-02-04 MED ORDER — DIPHENHYDRAMINE HCL 50 MG/ML IJ SOLN
INTRAMUSCULAR | Status: AC
  Filled 2020-02-04: qty 1

## 2020-02-04 MED ORDER — FAMOTIDINE IN NACL 20-0.9 MG/50ML-% IV SOLN
INTRAVENOUS | Status: AC
  Filled 2020-02-04: qty 50

## 2020-02-04 MED ORDER — PALONOSETRON HCL INJECTION 0.25 MG/5ML
0.2500 mg | Freq: Once | INTRAVENOUS | Status: AC
  Administered 2020-02-04: 0.25 mg via INTRAVENOUS

## 2020-02-04 MED ORDER — SODIUM CHLORIDE 0.9% FLUSH
10.0000 mL | INTRAVENOUS | Status: DC | PRN
  Administered 2020-02-04: 10 mL
  Filled 2020-02-04: qty 10

## 2020-02-04 MED ORDER — PALONOSETRON HCL INJECTION 0.25 MG/5ML
INTRAVENOUS | Status: AC
  Filled 2020-02-04: qty 5

## 2020-02-04 MED ORDER — ALTEPLASE 2 MG IJ SOLR
INTRAMUSCULAR | Status: AC
  Filled 2020-02-04: qty 2

## 2020-02-04 MED ORDER — ALTEPLASE 2 MG IJ SOLR
2.0000 mg | Freq: Once | INTRAMUSCULAR | Status: AC
  Administered 2020-02-04: 2 mg
  Filled 2020-02-04: qty 2

## 2020-02-04 MED ORDER — SODIUM CHLORIDE 0.9 % IV SOLN
187.8000 mg | Freq: Once | INTRAVENOUS | Status: AC
Start: 2020-02-04 — End: 2020-02-04
  Administered 2020-02-04: 190 mg via INTRAVENOUS
  Filled 2020-02-04: qty 19

## 2020-02-04 MED ORDER — SODIUM CHLORIDE 0.9 % IV SOLN
80.0000 mg/m2 | Freq: Once | INTRAVENOUS | Status: AC
  Administered 2020-02-04: 156 mg via INTRAVENOUS
  Filled 2020-02-04: qty 26

## 2020-02-04 NOTE — Progress Notes (Signed)
Unable to get blood return from port. cathflo administered by Tim RN @0851 . Patient sent back to lab to get labs drawn from arm.

## 2020-02-04 NOTE — Progress Notes (Signed)
Per Dr. Jana Hakim okay to treat with ANC of 1.3.   Estrella Myrtle, PharmD, BCPS PGY-2 Hematology/Oncology Pharmacy Resident

## 2020-02-04 NOTE — Progress Notes (Signed)
Per Dr. Jana Hakim, okay for patient to receive treatment today with [place] 1.3.

## 2020-02-04 NOTE — Patient Instructions (Signed)
   [facility] Discharge Instructions for Patients Receiving Chemotherapy  Today you received the following chemotherapy agents Taxol and Carboplatin   To help prevent nausea and vomiting after your treatment, we encourage you to take your nausea medication as directed.    If you develop nausea and vomiting that is not controlled by your nausea medication, call the clinic.   BELOW ARE SYMPTOMS THAT SHOULD BE REPORTED IMMEDIATELY:  *FEVER GREATER THAN 100.5 F  *CHILLS WITH OR WITHOUT FEVER  NAUSEA AND VOMITING THAT IS NOT CONTROLLED WITH YOUR NAUSEA MEDICATION  *UNUSUAL SHORTNESS OF BREATH  *UNUSUAL BRUISING OR BLEEDING  TENDERNESS IN MOUTH AND THROAT WITH OR WITHOUT PRESENCE OF ULCERS  *URINARY PROBLEMS  *BOWEL PROBLEMS  UNUSUAL RASH Items with * indicate a potential emergency and should be followed up as soon as possible.  Feel free to call the clinic should you have any questions or concerns. The clinic phone number is [phone].  Please show the CHEMO ALERT CARD at check-in to the Emergency Department and triage nurse.   

## 2020-02-07 ENCOUNTER — Telehealth: Payer: Self-pay | Admitting: Medical

## 2020-02-07 NOTE — Telephone Encounter (Signed)
Scheduled per 01/25 los, patient has been called and notified. 

## 2020-02-10 ENCOUNTER — Inpatient Hospital Stay: Payer: Medicare HMO

## 2020-02-10 ENCOUNTER — Other Ambulatory Visit: Payer: Self-pay

## 2020-02-10 ENCOUNTER — Other Ambulatory Visit: Payer: Self-pay | Admitting: *Deleted

## 2020-02-10 ENCOUNTER — Inpatient Hospital Stay: Payer: Medicare HMO | Admitting: Medical

## 2020-02-10 VITALS — BP 120/74 | HR 89 | Temp 97.6°F | Resp 15 | Ht 63.5 in | Wt 188.5 lb

## 2020-02-10 DIAGNOSIS — C50412 Malignant neoplasm of upper-outer quadrant of left female breast: Secondary | ICD-10-CM | POA: Diagnosis not present

## 2020-02-10 DIAGNOSIS — Z171 Estrogen receptor negative status [ER-]: Secondary | ICD-10-CM

## 2020-02-10 DIAGNOSIS — R011 Cardiac murmur, unspecified: Secondary | ICD-10-CM | POA: Diagnosis not present

## 2020-02-10 DIAGNOSIS — E669 Obesity, unspecified: Secondary | ICD-10-CM | POA: Diagnosis not present

## 2020-02-10 DIAGNOSIS — G4730 Sleep apnea, unspecified: Secondary | ICD-10-CM | POA: Diagnosis not present

## 2020-02-10 DIAGNOSIS — Z9221 Personal history of antineoplastic chemotherapy: Secondary | ICD-10-CM | POA: Diagnosis not present

## 2020-02-10 DIAGNOSIS — Z5111 Encounter for antineoplastic chemotherapy: Secondary | ICD-10-CM | POA: Diagnosis not present

## 2020-02-10 DIAGNOSIS — Z6833 Body mass index (BMI) 33.0-33.9, adult: Secondary | ICD-10-CM | POA: Diagnosis not present

## 2020-02-10 DIAGNOSIS — Z923 Personal history of irradiation: Secondary | ICD-10-CM | POA: Diagnosis not present

## 2020-02-10 DIAGNOSIS — Z95828 Presence of other vascular implants and grafts: Secondary | ICD-10-CM

## 2020-02-10 LAB — CBC WITH DIFFERENTIAL (CANCER CENTER ONLY)
Abs Immature Granulocytes: 0 10*3/uL (ref 0.00–0.07)
Basophils Absolute: 0 10*3/uL (ref 0.0–0.1)
Basophils Relative: 1 %
Eosinophils Absolute: 0 10*3/uL (ref 0.0–0.5)
Eosinophils Relative: 0 %
HCT: 27.8 % — ABNORMAL LOW (ref 36.0–46.0)
Hemoglobin: 9.7 g/dL — ABNORMAL LOW (ref 12.0–15.0)
Immature Granulocytes: 0 %
Lymphocytes Relative: 31 %
Lymphs Abs: 0.5 10*3/uL — ABNORMAL LOW (ref 0.7–4.0)
MCH: 34.5 pg — ABNORMAL HIGH (ref 26.0–34.0)
MCHC: 34.9 g/dL (ref 30.0–36.0)
MCV: 98.9 fL (ref 80.0–100.0)
Monocytes Absolute: 0.2 10*3/uL (ref 0.1–1.0)
Monocytes Relative: 14 %
Neutro Abs: 0.9 10*3/uL — ABNORMAL LOW (ref 1.7–7.7)
Neutrophils Relative %: 54 %
Platelet Count: 143 10*3/uL — ABNORMAL LOW (ref 150–400)
RBC: 2.81 MIL/uL — ABNORMAL LOW (ref 3.87–5.11)
RDW: 13.2 % (ref 11.5–15.5)
WBC Count: 1.6 10*3/uL — ABNORMAL LOW (ref 4.0–10.5)
nRBC: 0 % (ref 0.0–0.2)

## 2020-02-10 LAB — CMP (CANCER CENTER ONLY)
ALT: 11 U/L (ref 0–44)
AST: 14 U/L — ABNORMAL LOW (ref 15–41)
Albumin: 3.9 g/dL (ref 3.5–5.0)
Alkaline Phosphatase: 53 U/L (ref 38–126)
Anion gap: 6 (ref 5–15)
BUN: 16 mg/dL (ref 8–23)
CO2: 26 mmol/L (ref 22–32)
Calcium: 8.9 mg/dL (ref 8.9–10.3)
Chloride: 106 mmol/L (ref 98–111)
Creatinine: 0.9 mg/dL (ref 0.44–1.00)
GFR, Estimated: >60 mL/min (ref >60)
Glucose, Bld: 89 mg/dL (ref 70–99)
Potassium: 4 mmol/L (ref 3.5–5.1)
Sodium: 138 mmol/L (ref 135–145)
Total Bilirubin: 0.5 mg/dL (ref 0.3–1.2)
Total Protein: 6.4 g/dL — ABNORMAL LOW (ref 6.5–8.1)

## 2020-02-10 MED ORDER — SODIUM CHLORIDE 0.9% FLUSH
10.0000 mL | Freq: Once | INTRAVENOUS | Status: AC
  Administered 2020-02-10: 10 mL
  Filled 2020-02-10: qty 10

## 2020-02-10 MED ORDER — HEPARIN SOD (PORK) LOCK FLUSH 100 UNIT/ML IV SOLN
500.0000 [IU] | Freq: Once | INTRAVENOUS | Status: AC
  Administered 2020-02-10: 500 [IU]
  Filled 2020-02-10: qty 5

## 2020-02-10 NOTE — Progress Notes (Signed)
Symptoms Management Clinic Progress Note   Cheyenne Mcdonald [id number] 09-04-1950 70 y.o.  Cheyenne Mcdonald is managed by Dr. Ruthann Cancer  Actively treated with chemotherapy/immunotherapy/hormonal therapy: yes  Current therapy: Carboplatin and paclitaxel  Last treated: 01/28/2020 (cycle 11, day 1)  Next scheduled appointment with provider: 02/10/2020  Assessment: Plan:    Malignant neoplasm of upper-outer quadrant of left breast in female, estrogen receptor negative (HCC)  Drug-induced polyneuropathy (HCC)   ER negative malignant neoplasm of the left breast: The patient presents to clinic today for cycle 12, day 1 of carboplatin and paclitaxel.  The patient's labs were reviewed with Dr. Darnelle Catalan.  He is agreeable to proceed with the patient's chemotherapy today despite their WBC of 2.2 and ANC of 1.3.  We will proceed with her treatment today and have her return to the clinic next week for repeat labs and for consideration of her next cycle of chemotherapy.  Chemotherapy-induced neuropathy: I discussed beginning the patient on Neurontin 300 mg p.o. nightly.  She however declines the addition of Neurontin at this time.  We will continue to monitor this conservatively.  Please see After Visit Summary for patient specific instructions.  Future Appointments  Date Time Provider Department Center  02/11/2020  8:45 AM [facility] INFUSION [facility] None  02/18/2020  9:15 AM [facility] LAB [facility] None  02/18/2020  9:30 AM [facility] FLUSH [facility] None  02/18/2020 10:00 AM Magrinat, Valentino Hue, MD [facility] None  02/18/2020 10:45 AM [facility] INFUSION [facility] None    No orders of the defined types were placed in this encounter.      Subjective:   Patient ID:  Cheyenne Mcdonald is a 70 y.o. (DOB 03-10-1950) female.  Chief Complaint: No chief complaint on file.   HPI Cheyenne Mcdonald  is a 70 y.o. female with a diagnosis of an ER negative malignant neoplasm of the left  breast.  She is followed by Dr. Darnelle Catalan and presents to clinic today for cycle 12, day 1 of carboplatin and paclitaxel.  She reports occasional nausea with no vomiting.  She has some dyspnea on exertion when walking.  She also has mild neuropathy and her fingertips which do not affect her activities of daily living.  She is not interested in pursuing any medications for her neuropathy at this time.  Medications: I have reviewed the patient's current medications.  Allergies:  Allergies  Allergen Reactions  . Other Itching  . Shellfish Allergy Itching    Past Medical History:  Diagnosis Date  . Breast cancer (HCC)   . Obesity   . Sleep apnea     Past Surgical History:  Procedure Laterality Date  . ABDOMINAL HYSTERECTOMY    . IR IMAGING GUIDED PORT INSERTION  10/18/2019    Family History  Problem Relation Age of Onset  . Breast cancer Cousin 32       maternal cousin; bilateral     Social History   Socioeconomic History  . Marital status: Married    Spouse name: Not on file  . Number of children: Not on file  . Years of education: Not on file  . Highest education level: Not on file  Occupational History  . Not on file  Tobacco Use  . Smoking status: Never Smoker  . Smokeless tobacco: Never Used  Substance and Sexual Activity  . Alcohol use: Never  . Drug use: Never  . Sexual activity: Not on file  Other Topics Concern  . Not on file  Social History Narrative  . Not on file   Social Determinants of Health   Financial Resource Strain: Not on file  Food Insecurity: Not on file  Transportation Needs: Not on file  Physical Activity: Not on file  Stress: Not on file  Social Connections: Not on file  Intimate Partner Violence: Not on file    Past Medical History, Surgical history, Social history, and Family history were reviewed and updated as appropriate.   Please see review of systems for further details on the patient's review from today.   Review of  Systems:  Review of Systems  Constitutional: Negative for appetite change, chills, diaphoresis and fever.  HENT: Negative for trouble swallowing.   Respiratory: Positive for shortness of breath. Negative for cough, choking and wheezing.   Cardiovascular: Negative for chest pain and palpitations.  Gastrointestinal: Positive for nausea. Negative for constipation, diarrhea and vomiting.  Genitourinary: Negative for decreased urine volume.  Neurological: Positive for numbness. Negative for headaches.    Objective:   Physical Exam:  BP 124/70 (BP Location: Right Arm, Patient Position: Sitting)   Pulse 94   Temp 97.6 F (36.4 C) (Tympanic)   Resp 17   Ht 5' 3.5" (1.613 m)   Wt 192 lb 11.2 oz (87.4 kg)   SpO2 100%   BMI 33.60 kg/m  ECOG: 0  Physical Exam Constitutional:      General: She is not in acute distress.    Appearance: She is not diaphoretic.  HENT:     Head: Normocephalic and atraumatic.  Eyes:     General: No scleral icterus.       Right eye: No discharge.        Left eye: No discharge.     Conjunctiva/sclera: Conjunctivae normal.  Cardiovascular:     Rate and Rhythm: Normal rate and regular rhythm.     Heart sounds: Normal heart sounds. No murmur heard. No friction rub. No gallop.   Pulmonary:     Effort: Pulmonary effort is normal. No respiratory distress.     Breath sounds: Normal breath sounds. No wheezing or rales.  Musculoskeletal:     Right lower leg: No edema.     Left lower leg: No edema.  Skin:    General: Skin is warm and dry.     Findings: No erythema or rash.  Neurological:     Mental Status: She is alert.     Coordination: Coordination normal.     Gait: Gait normal.  Psychiatric:        Mood and Affect: Mood normal.        Behavior: Behavior normal.        Thought Content: Thought content normal.        Judgment: Judgment normal.     Lab Review:     Component Value Date/Time   NA 142 02/04/2020 0900   K 3.8 02/04/2020 0900   CL 107  02/04/2020 0900   CO2 28 02/04/2020 0900   GLUCOSE 86 02/04/2020 0900   BUN 14 02/04/2020 0900   CREATININE 0.93 02/04/2020 0900   CALCIUM 8.9 02/04/2020 0900   PROT 6.3 (L) 02/04/2020 0900   ALBUMIN 3.7 02/04/2020 0900   AST 14 (L) 02/04/2020 0900   ALT 10 02/04/2020 0900   ALKPHOS 61 02/04/2020 0900   BILITOT 0.3 02/04/2020 0900   GFRNONAA >60 02/04/2020 0900   GFRAA 58 (L) 10/09/2019 0806       Component Value Date/Time   WBC 2.2 (L) 02/04/2020  0900   WBC 5.4 10/18/2019 1005   RBC 2.90 (L) 02/04/2020 0900   HGB 10.0 (L) 02/04/2020 0900   HCT 29.2 (L) 02/04/2020 0900   PLT 144 (L) 02/04/2020 0900   MCV 100.7 (H) 02/04/2020 0900   MCH 34.5 (H) 02/04/2020 0900   MCHC 34.2 02/04/2020 0900   RDW 13.9 02/04/2020 0900   LYMPHSABS 0.5 (L) 02/04/2020 0900   MONOABS 0.3 02/04/2020 0900   EOSABS 0.0 02/04/2020 0900   BASOSABS 0.0 02/04/2020 0900   -------------------------------  Imaging from last 24 hours (if applicable):  Radiology interpretation: No results found.      This case was discussed with Dr. Jana Hakim. He expressed his agreement with my management of this patient.

## 2020-02-11 ENCOUNTER — Inpatient Hospital Stay: Payer: Medicare HMO

## 2020-02-11 ENCOUNTER — Other Ambulatory Visit: Payer: Medicare HMO

## 2020-02-12 NOTE — Progress Notes (Signed)
Sympt[facility] Management Clinic Progress Note   Cheyenne Mcdonald [id number] 1950/06/02 70 y.o.  HARRY BARK is managed by Dr. Lurline Del  Actively treated with chemotherapy/immunotherapy/hormonal therapy: yes  Current therapy: Carboplatin and paclitaxel  Last treated: 02/04/2020 (cycle 12, day 1)  Next scheduled appointment with provider: 02/18/2020  Assessment: Plan:    Malignant neoplasm of upper-outer quadrant of left breast in female, estrogen receptor negative ([place]) - Plan: CBC with Differential ([place] Only), CMP ([place] only)   ER negative malignant neoplasm of the left breast: The patient presents to clinic today status post cycle 12, day 1 of carboplatin and paclitaxel.  At her last chemotherapy she was noted to have a WBC of 2.2 and ANC of 1.3.  She is seen today for count checks.  She will return for follow-up and for consideration of her next dosing of chemotherapy on 02/18/2020  Please see After Visit Summary for patient specific instructions.  Future Appointments  Date Time Provider [place]  02/18/2020  9:15 AM [facility] LAB [facility] None  02/18/2020  9:30 AM [facility] [place] FLUSH [facility] None  02/18/2020 10:00 AM Magrinat, Virgie Dad, MD [facility] None  02/18/2020 10:45 AM [facility] INFUSION [facility] None  02/25/2020 10:15 AM [facility] LAB [facility] None  02/25/2020 10:30 AM [facility] [place] FLUSH [facility] None  02/25/2020 11:00 AM Deziah Renwick, Lucianne Lei E., PA-C [facility] None  02/25/2020 11:45 AM [facility] INFUSION [facility] None    Orders Placed This Encounter  Procedures  . CBC with Differential ([place] Only)  . CMP ([place] only)       Subjective:   Patient ID:  Cheyenne Mcdonald is a 70 y.o. (DOB [birth year]) female.  Chief Complaint: No chief complaint on file.   HPI Cheyenne Mcdonald  is a 70 y.o. female with a diagnosis of an ER negative malignant neoplasm of the left breast: The patient presents to clinic today  status post cycle 12, day 1 of carboplatin and paclitaxel.  At her last chemotherapy she was noted to have a WBC of 2.2 and ANC of 1.3.  She is seen today for count checks.  She reports that she tolerated her last chemotherapy well with no acute issues of concern except for continued to have some mild numbness and tingling in her fingers.  She denies any fevers, chills, sweats, nausea, vomiting, constipation, or diarrhea.  She reports that she feels relatively well today.  Medications: I have reviewed the patient's current medications.  Allergies:  Allergies  Allergen Reactions  . Other Itching  . Shellfish Allergy Itching    Past Medical History:  Diagnosis Date  . Breast cancer ([place])   . Obesity   . Sleep apnea     Past Surgical History:  Procedure Laterality Date  . ABDOMINAL HYSTERECTOMY    . IR IMAGING GUIDED PORT INSERTION  10/18/2019    Family History  Problem Relation Age of Onset  . Breast cancer Cousin 30       maternal cousin; bilateral     Social History   Socioeconomic History  . Marital status: Married    Spouse name: Not on file  . Number of children: Not on file  . Years of education: Not on file  . Highest education level: Not on file  Occupational History  . Not on file  Tobacco Use  . Smoking status: Never Smoker  . Smokeless tobacco: Never Used  Substance and Sexual Activity  . Alcohol use: Never  . Drug use:  Never  . Sexual activity: Not on file  Other Topics Concern  . Not on file  Social History Narrative  . Not on file   Social Determinants of Health   Financial Resource Strain: Not on file  Food Insecurity: Not on file  Transportation Needs: Not on file  Physical Activity: Not on file  Stress: Not on file  Social Connections: Not on file  Intimate Partner Violence: Not on file    Past Medical History, Surgical history, Social history, and Family history were reviewed and updated as appropriate.   Please see review of systems for  further details on the patient's review from today.   Review of Systems:  Review of Systems  Constitutional: Negative for appetite change, chills, diaphoresis and fever.  HENT: Negative for trouble swallowing.   Respiratory: Negative for cough, choking, shortness of breath and wheezing.   Cardiovascular: Negative for chest pain and palpitations.  Gastrointestinal: Negative for constipation, diarrhea, nausea and vomiting.  Genitourinary: Negative for decreased urine volume.  Neurological: Positive for numbness. Negative for headaches.    Objective:   Physical Exam:  BP 120/74 (BP Location: Left Arm, Patient Position: Sitting)   Pulse 89   Temp 97.6 F (36.4 C) (Tympanic)   Resp 15   Ht 5' 3.5" (1.613 m)   Wt 188 lb 8 oz (85.5 kg)   SpO2 100%   BMI 32.87 kg/m  ECOG: 0  Physical Exam Constitutional:      General: She is not in acute distress.    Appearance: She is not diaphoretic.  HENT:     Head: Normocephalic and atraumatic.  Eyes:     General: No scleral icterus.       Right eye: No discharge.        Left eye: No discharge.     Conjunctiva/sclera: Conjunctivae normal.  Cardiovascular:     Rate and Rhythm: Normal rate and regular rhythm.     Heart sounds: Normal heart sounds. No murmur heard. No friction rub. No gallop.   Pulmonary:     Effort: Pulmonary effort is normal. No respiratory distress.     Breath sounds: Normal breath sounds. No wheezing or rales.  Musculoskeletal:     Right lower leg: No edema.     Left lower leg: No edema.  Skin:    General: Skin is warm and dry.     Findings: No erythema or rash.  Neurological:     Mental Status: She is alert.     Coordination: Coordination normal.     Gait: Gait normal.  Psychiatric:        Mood and Affect: Mood normal.        Behavior: Behavior normal.        Thought Content: Thought content normal.        Judgment: Judgment normal.     Lab Review:     Component Value Date/Time   NA 138 02/10/2020 1114    K 4.0 02/10/2020 1114   CL 106 02/10/2020 1114   CO2 26 02/10/2020 1114   GLUCOSE 89 02/10/2020 1114   BUN 16 02/10/2020 1114   CREATININE 0.90 02/10/2020 1114   CALCIUM 8.9 02/10/2020 1114   PROT 6.4 (L) 02/10/2020 1114   ALBUMIN 3.9 02/10/2020 1114   AST 14 (L) 02/10/2020 1114   ALT 11 02/10/2020 1114   ALKPHOS 53 02/10/2020 1114   BILITOT 0.5 02/10/2020 1114   GFRNONAA >60 02/10/2020 1114   GFRAA 58 (L) 10/09/2019 2440  Component Value Date/Time   WBC 1.6 (L) 02/10/2020 1114   WBC 5.4 10/18/2019 1005   RBC 2.81 (L) 02/10/2020 1114   HGB 9.7 (L) 02/10/2020 1114   HCT 27.8 (L) 02/10/2020 1114   PLT 143 (L) 02/10/2020 1114   MCV 98.9 02/10/2020 1114   MCH 34.5 (H) 02/10/2020 1114   MCHC 34.9 02/10/2020 1114   RDW 13.2 02/10/2020 1114   LYMPHSABS 0.5 (L) 02/10/2020 1114   MONOABS 0.2 02/10/2020 1114   EOSABS 0.0 02/10/2020 1114   BASOSABS 0.0 02/10/2020 1114   -------------------------------  Imaging from last 24 hours (if applicable):  Radiology interpretation: No results found.

## 2020-02-17 NOTE — Progress Notes (Signed)
[facility]  [phone] [phone]     ID: Cheyenne Mcdonald DOB: 1950/06/18  MR#: [id number]  GMW#:[id number]  Patient Care Team: Benito Mccreedy, MD as PCP - General (Internal Medicine) Servando Salina, MD as Consulting Physician (Obstetrics and Gynecology) Mauro Kaufmann, RN as Oncology Nurse Navigator Rockwell Germany, RN as Oncology Nurse Navigator [name], MD as Consulting Physician (General Surgery) Uriah Trueba, Virgie Dad, MD as Consulting Physician (Oncology) Gery Pray, MD as Consulting Physician (Radiation Oncology) Dimas Aguas, MD as Consulting Physician (Nephrology) Aurea Graff OTHER MD:  CHIEF COMPLAINT: triple negative breast cancer  CURRENT TREATMENT: Neoadjuvant chemotherapy   INTERVAL HISTORY: [name] returns today for follow up and treatment of her triple negative breast cancer.  She is accompanied by her husband  She continues on weekly paclitaxel and carboplatin, which will be repeated x12, on 12/17/2019. Today is week 9.  Her most recent echocardiogram from 10/17/2019 showed an ejection fraction of 60-65%.  She is not scheduled for repeat echo at this point.   REVIEW OF SYSTEMS: Ludella is having some symptoms that could well be early neuropathy.  She is losing both her big toenails and this does complicate assessment.  She feels tired.  She has been told by a friend that she should take some "source of life cold", which may be an herbal extract.  She will try to find out what it actually contains.  Overall as her taste is better her sleep is better and she has more appetite.  Otherwise a detailed review of systems was stable   COVID 19 VACCINATION STATUS: Status post [place] x2 with booster October 2021   HISTORY OF CURRENT ILLNESS: From the original intake note:  Cheyenne Mcdonald presented for her routine mammography with a palpable left breast lump. She underwent bilateral diagnostic mammography  with tomography and left breast ultrasonography at [facility] on 09/13/2019 showing: breast density category B; two adjacent masses in left breast at 1 o'clock, spanning approximately 3.3 cm; one indeterminate left axillary lymph node with 0.4 cm cortical bulge; no evidence right breast malignancy.  Accordingly on 09/27/2019 she proceeded to biopsy of the left breast areas in question. The pathology from this procedure ([id number]) showed: invasive ductal carcinoma, grade 3, present in both of the adjacent masses. Prognostic indicators significant for: estrogen receptor, 0% negative and progesterone receptor, 0% negative. Proliferation marker Ki67 at 90%. HER2 equivocal by immunohistochemistry (2+), but negative by fluorescent in situ hybridization with a signals ratio 2.2 and number per cell 3.3. Additional analysis of more cells showed a signals ratio 2.05 and number per cell 3.13.  The biopsied lymph node was negative for carcinoma.  This was felt to be concordant  The patient's subsequent history is as detailed below.   PAST MEDICAL HISTORY: Past Medical History:  Diagnosis Date  . Breast cancer ([place])   . Obesity   . Sleep apnea   heart murmur (since childhood)   PAST SURGICAL HISTORY: Past Surgical History:  Procedure Laterality Date  . ABDOMINAL HYSTERECTOMY    . IR IMAGING GUIDED PORT INSERTION  10/18/2019    FAMILY HISTORY: Family History  Problem Relation Age of Onset  . Breast cancer Cousin 78       maternal cousin; bilateral    Her father died at age 73, cause of death unknown. Her mother is age 41 as of 09/2019. Cheyenne Mcdonald has two brothers (and no sisters). She reports one cousin with breast cancer at age 78.  GYNECOLOGIC HISTORY:  No LMP recorded. Patient has had a hysterectomy. Menarche: 70 years old Age at first live birth: 70 years old GX P 2 LMP 1990 Contraceptive: used for maybe 20 years HRT: never used  Hysterectomy? Yes, 1990 BSO? 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 Husband Cheyenne Mcdonald is retired Hotel manager and then retired Therapist, occupational.  At home is just the 2 of them. Daughter Cheyenne Mcdonald, age 35, works in data entry in [employer]. Son Cheyenne Mcdonald, age 72, has a college degree but works for [employer] in [employer]. Cheyenne Mcdonald has four grandchildren. She attends [place]. Cheyenne Mcdonald of [address]    ADVANCED DIRECTIVES: In place   HEALTH MAINTENANCE: Social History   Tobacco Use  . Smoking status: Never Smoker  . Smokeless tobacco: Never Used  Substance Use Topics  . Alcohol use: Never  . Drug use: Never     Colonoscopy: 2018 (Dr. Loreta Ave)  PAP: approx. 2013  Bone density: 2016, "normal"   Allergies  Allergen Reactions  . Other Itching  . Shellfish Allergy Itching    Current Outpatient Medications  Medication Sig Dispense Refill  . Cholecalciferol 125 MCG (5000 UT) capsule Take by mouth.    . lidocaine-prilocaine (EMLA) cream Apply to affected area once (Patient not taking: Reported on 02/04/2020) 30 g 3  . loratadine (CLARITIN) 10 MG tablet Take 1 tablet (10 mg total) by mouth daily. (Patient not taking: Reported on 02/04/2020) 40 tablet 0  . LORazepam (ATIVAN) 0.5 MG tablet Take 1 tablet (0.5 mg total) by mouth at bedtime as needed (Nausea or vomiting). (Patient not taking: Reported on 02/04/2020) 20 tablet 0  . prochlorperazine (COMPAZINE) 10 MG tablet Take 1 tablet (10 mg total) by mouth every 6 (six) hours as needed (Nausea or vomiting). (Patient not taking: Reported on 02/04/2020) 30 tablet 1   No current facility-administered medications for this visit.    OBJECTIVE: African-American woman in no acute distress  There were no vitals filed for this visit.   There is no height or weight on file to calculate BMI.   Wt Readings from Last 3 Encounters:  02/10/20 188 lb 8 oz (85.5 kg)  02/04/20 192 lb 11.2 oz (87.4 kg)  01/28/20 191 lb (86.6 kg)      ECOG FS:1 - Symptomatic but  completely ambulatory  Sclerae unicteric, EOMs intact Wearing a mask No cervical or supraclavicular adenopathy Lungs no rales or rhonchi Heart regular rate and rhythm Abd soft, nontender, positive bowel sounds MSK no focal spinal tenderness, no upper extremity lymphedema Neuro: nonfocal, well oriented, appropriate affect Breasts: There is no palpable mass in the left breast.  There are no skin or nipple changes of concern.  Both axillae are benign.   LAB RESULTS:  CMP     Component Value Date/Time   NA 138 02/10/2020 1114   K 4.0 02/10/2020 1114   CL 106 02/10/2020 1114   CO2 26 02/10/2020 1114   GLUCOSE 89 02/10/2020 1114   BUN 16 02/10/2020 1114   CREATININE 0.90 02/10/2020 1114   CALCIUM 8.9 02/10/2020 1114   PROT 6.4 (L) 02/10/2020 1114   ALBUMIN 3.9 02/10/2020 1114   AST 14 (L) 02/10/2020 1114   ALT 11 02/10/2020 1114   ALKPHOS 53 02/10/2020 1114   BILITOT 0.5 02/10/2020 1114   GFRNONAA >60 02/10/2020 1114   GFRAA 58 (L) 10/09/2019 0806    No results found for: TOTALPROTELP, ALBUMINELP, A1GS, A2GS, BETS,  Carloyn Jaeger, MSPIKE, SPEI  Lab Results  Component Value Date   WBC 1.6 (L) 02/10/2020   NEUTROABS 0.9 (L) 02/10/2020   HGB 9.7 (L) 02/10/2020   HCT 27.8 (L) 02/10/2020   MCV 98.9 02/10/2020   PLT 143 (L) 02/10/2020    No results found for: LABCA2  No components found for: IWPYKD983  No results for input(s): INR in the last 168 hours.  No results found for: LABCA2  No results found for: JAS505  No results found for: LZJ673  No results found for: ALP379  No results found for: CA2729  No components found for: HGQUANT  No results found for: CEA1 / No results found for: CEA1   No results found for: AFPTUMOR  No results found for: CHROMOGRNA  No results found for: KPAFRELGTCHN, LAMBDASER, KAPLAMBRATIO (kappa/lambda light chains)  No results found for: HGBA, HGBA2QUANT, HGBFQUANT, HGBSQUAN (Hemoglobinopathy evaluation)   No results found  for: LDH  No results found for: IRON, TIBC, IRONPCTSAT (Iron and TIBC)  No results found for: FERRITIN  Urinalysis No results found for: COLORURINE, APPEARANCEUR, LABSPEC, PHURINE, GLUCOSEU, HGBUR, BILIRUBINUR, KETONESUR, PROTEINUR, UROBILINOGEN, NITRITE, LEUKOCYTESUR   STUDIES: No results found.   ELIGIBLE FOR AVAILABLE RESEARCH PROTOCOL: AET  ASSESSMENT: 70 y.o. [place] woman status post left breast upper outer quadrant biopsy 09/27/2019 for a clinically T1-T2 N0, stage IA- IIA invasive ductal carcinoma, grade 3, triple negative, with an MIB-1 of 90%.  (1) genetics testing results pending  (2) neoadjuvant chemotherapy will consist of doxorubicin and cyclophosphamide in dose dense fashion x4 starting 10/22/2019 to be followed by weekly carboplatin and paclitaxel x12  (a) echo 10/17/2019 shows an ejection fraction in the 60-65% range  (3) definitive surgery to follow  (4) adjuvant radiation as appropriate   PLAN: Chela is generally tolerating treatment well.  She will lose both her big toe nails and she was eager to pull them but I suggested she just let them come off and let the new nail coming out push the old one.  I am concerned that she may traumatize the nailbed and end up with unsightly nails otherwise.  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  This is not enough that we would interrupt treatment at this point but I think this well may be her final treatment.  To help [place] make that decision when she returns to see me next week I will see if we can get an MRI of the breast to give [place] an indication of response to this point.  Clinically by palpation there is no evidence of residual tumor.  She will see me again in 1 week  She knows to call for any other issue that may develop before that  Encounter time 25 minutes.Sarajane Jews C. Zilda No, MD 02/17/2020 7:19 PM Medical Oncology and Hematology [facility] [place], [place]  [address] Tel. [phone]    Fax. [phone]   This document serves as a record of services personally performed by Lurline Del, MD. It was created on his behalf by Wilburn Mylar, a trained medical scribe. The creation of this record is based on the scribe's personal observations and the provider's statements to them.   I, Lurline Del MD, have reviewed the above documentation for accuracy and completeness, and I agree with the above.   *Total Encounter Time as defined by the Centers for Medicare and Medicaid Services includes, in addition to the face-to-face time of a patient visit (documented  in the note above) non-face-to-face time: obtaining and reviewing outside history, ordering and reviewing medications, tests or procedures, care coordination (communications with other health care professionals or caregivers) and documentation in the medical record.

## 2020-02-18 ENCOUNTER — Inpatient Hospital Stay: Payer: Medicare HMO

## 2020-02-18 ENCOUNTER — Inpatient Hospital Stay: Payer: Medicare HMO | Attending: Oncology

## 2020-02-18 ENCOUNTER — Other Ambulatory Visit: Payer: Self-pay

## 2020-02-18 ENCOUNTER — Inpatient Hospital Stay: Payer: Medicare HMO | Admitting: Oncology

## 2020-02-18 ENCOUNTER — Encounter: Payer: Self-pay | Admitting: *Deleted

## 2020-02-18 VITALS — BP 113/64 | HR 106 | Temp 97.4°F | Resp 18 | Ht 63.5 in | Wt 197.9 lb

## 2020-02-18 VITALS — HR 77

## 2020-02-18 DIAGNOSIS — Z79899 Other long term (current) drug therapy: Secondary | ICD-10-CM | POA: Insufficient documentation

## 2020-02-18 DIAGNOSIS — Z5189 Encounter for other specified aftercare: Secondary | ICD-10-CM | POA: Insufficient documentation

## 2020-02-18 DIAGNOSIS — Z5111 Encounter for antineoplastic chemotherapy: Secondary | ICD-10-CM | POA: Insufficient documentation

## 2020-02-18 DIAGNOSIS — R011 Cardiac murmur, unspecified: Secondary | ICD-10-CM | POA: Insufficient documentation

## 2020-02-18 DIAGNOSIS — C50412 Malignant neoplasm of upper-outer quadrant of left female breast: Secondary | ICD-10-CM | POA: Diagnosis not present

## 2020-02-18 DIAGNOSIS — R5383 Other fatigue: Secondary | ICD-10-CM | POA: Diagnosis not present

## 2020-02-18 DIAGNOSIS — Z171 Estrogen receptor negative status [ER-]: Secondary | ICD-10-CM | POA: Insufficient documentation

## 2020-02-18 DIAGNOSIS — Z95828 Presence of other vascular implants and grafts: Secondary | ICD-10-CM

## 2020-02-18 LAB — CBC WITH DIFFERENTIAL (CANCER CENTER ONLY)
Abs Immature Granulocytes: 0.02 10*3/uL (ref 0.00–0.07)
Basophils Absolute: 0 10*3/uL (ref 0.0–0.1)
Basophils Relative: 1 %
Eosinophils Absolute: 0 10*3/uL (ref 0.0–0.5)
Eosinophils Relative: 0 %
HCT: 29.2 % — ABNORMAL LOW (ref 36.0–46.0)
Hemoglobin: 10 g/dL — ABNORMAL LOW (ref 12.0–15.0)
Immature Granulocytes: 1 %
Lymphocytes Relative: 22 %
Lymphs Abs: 0.5 10*3/uL — ABNORMAL LOW (ref 0.7–4.0)
MCH: 34.4 pg — ABNORMAL HIGH (ref 26.0–34.0)
MCHC: 34.2 g/dL (ref 30.0–36.0)
MCV: 100.3 fL — ABNORMAL HIGH (ref 80.0–100.0)
Monocytes Absolute: 0.5 10*3/uL (ref 0.1–1.0)
Monocytes Relative: 24 %
Neutro Abs: 1.2 10*3/uL — ABNORMAL LOW (ref 1.7–7.7)
Neutrophils Relative %: 52 %
Platelet Count: 139 10*3/uL — ABNORMAL LOW (ref 150–400)
RBC: 2.91 MIL/uL — ABNORMAL LOW (ref 3.87–5.11)
RDW: 13.2 % (ref 11.5–15.5)
WBC Count: 2.2 10*3/uL — ABNORMAL LOW (ref 4.0–10.5)
nRBC: 0 % (ref 0.0–0.2)

## 2020-02-18 LAB — CMP (CANCER CENTER ONLY)
ALT: 12 U/L (ref 0–44)
AST: 17 U/L (ref 15–41)
Albumin: 3.6 g/dL (ref 3.5–5.0)
Alkaline Phosphatase: 57 U/L (ref 38–126)
Anion gap: 7 (ref 5–15)
BUN: 12 mg/dL (ref 8–23)
CO2: 25 mmol/L (ref 22–32)
Calcium: 9.2 mg/dL (ref 8.9–10.3)
Chloride: 107 mmol/L (ref 98–111)
Creatinine: 0.87 mg/dL (ref 0.44–1.00)
GFR, Estimated: >60 mL/min (ref >60)
Glucose, Bld: 105 mg/dL — ABNORMAL HIGH (ref 70–99)
Potassium: 3.8 mmol/L (ref 3.5–5.1)
Sodium: 139 mmol/L (ref 135–145)
Total Bilirubin: 0.3 mg/dL (ref 0.3–1.2)
Total Protein: 6.1 g/dL — ABNORMAL LOW (ref 6.5–8.1)

## 2020-02-18 MED ORDER — PALONOSETRON HCL INJECTION 0.25 MG/5ML
INTRAVENOUS | Status: AC
  Filled 2020-02-18: qty 5

## 2020-02-18 MED ORDER — CARBOPLATIN CHEMO INJECTION 450 MG/45ML
187.8000 mg | Freq: Once | INTRAVENOUS | Status: AC
  Administered 2020-02-18: 190 mg via INTRAVENOUS
  Filled 2020-02-18: qty 19

## 2020-02-18 MED ORDER — SODIUM CHLORIDE 0.9% FLUSH
10.0000 mL | INTRAVENOUS | Status: DC | PRN
  Administered 2020-02-18: 3 mL
  Filled 2020-02-18: qty 10

## 2020-02-18 MED ORDER — SODIUM CHLORIDE 0.9 % IV SOLN
Freq: Once | INTRAVENOUS | Status: AC
  Filled 2020-02-18: qty 250

## 2020-02-18 MED ORDER — SODIUM CHLORIDE 0.9 % IV SOLN
80.0000 mg/m2 | Freq: Once | INTRAVENOUS | Status: AC
  Administered 2020-02-18: 156 mg via INTRAVENOUS
  Filled 2020-02-18: qty 26

## 2020-02-18 MED ORDER — SODIUM CHLORIDE 0.9% FLUSH
10.0000 mL | Freq: Once | INTRAVENOUS | Status: AC
  Administered 2020-02-18: 10 mL
  Filled 2020-02-18: qty 10

## 2020-02-18 MED ORDER — FAMOTIDINE IN NACL 20-0.9 MG/50ML-% IV SOLN
INTRAVENOUS | Status: AC
  Filled 2020-02-18: qty 50

## 2020-02-18 MED ORDER — DIPHENHYDRAMINE HCL 50 MG/ML IJ SOLN
25.0000 mg | Freq: Once | INTRAMUSCULAR | Status: AC
  Administered 2020-02-18: 25 mg via INTRAVENOUS

## 2020-02-18 MED ORDER — FAMOTIDINE IN NACL 20-0.9 MG/50ML-% IV SOLN
20.0000 mg | Freq: Once | INTRAVENOUS | Status: AC
  Administered 2020-02-18: 20 mg via INTRAVENOUS

## 2020-02-18 MED ORDER — PALONOSETRON HCL INJECTION 0.25 MG/5ML
0.2500 mg | Freq: Once | INTRAVENOUS | Status: AC
  Administered 2020-02-18: 0.25 mg via INTRAVENOUS

## 2020-02-18 MED ORDER — DIPHENHYDRAMINE HCL 50 MG/ML IJ SOLN
INTRAMUSCULAR | Status: AC
  Filled 2020-02-18: qty 1

## 2020-02-18 MED ORDER — SODIUM CHLORIDE 0.9 % IV SOLN
10.0000 mg | Freq: Once | INTRAVENOUS | Status: AC
  Administered 2020-02-18: 10 mg via INTRAVENOUS
  Filled 2020-02-18: qty 1
  Filled 2020-02-18: qty 10

## 2020-02-18 MED ORDER — HEPARIN SOD (PORK) LOCK FLUSH 100 UNIT/ML IV SOLN
500.0000 [IU] | Freq: Once | INTRAVENOUS | Status: AC | PRN
  Administered 2020-02-18: 500 [IU]
  Filled 2020-02-18: qty 5

## 2020-02-18 NOTE — Progress Notes (Signed)
Nutrition Assessment:  Patient identified by Malnutrition screening report for weight loss.  70 year old female with left breast cancer.  Patient receiving paclitaxel and carboplatin.  Met with patient during infusion. Patient reports that appetite and taste are back as did not receive treatment last week.  Has been eating more and gained "too much weight back."  Patient wanting to loose weight and get back to 180s.  Reports that she has been eating too many ginger snap cookies and orange sherbet.  States that her appetite is good and she has continued to eat during treatment without difficulty     Medications: reviewed  Labs: reviewed  Anthropometrics:   Height: 63.5 inches Weight: 197 lb today increased from 188 lb on 1/31 UBW: 190s BMI: 32   NUTRITION DIAGNOSIS: None at this time   INTERVENTION:  Discussed importance of weight maintenance and good nutrition during treatment.   Encouraged well-balanced diet including good sources of protein.   Contact information provided    NEXT VISIT: no follow-up Available if needed  Vianka Ertel B. Zenia Resides, [place], [place] Registered Dietitian [phone] (mobile)

## 2020-02-18 NOTE — Patient Instructions (Signed)
   [facility] Discharge Instructions for Patients Receiving Chemotherapy  Today you received the following chemotherapy agents Taxol and Carboplatin   To help prevent nausea and vomiting after your treatment, we encourage you to take your nausea medication as directed.    If you develop nausea and vomiting that is not controlled by your nausea medication, call the clinic.   BELOW ARE SYMPTOMS THAT SHOULD BE REPORTED IMMEDIATELY:  *FEVER GREATER THAN 100.5 F  *CHILLS WITH OR WITHOUT FEVER  NAUSEA AND VOMITING THAT IS NOT CONTROLLED WITH YOUR NAUSEA MEDICATION  *UNUSUAL SHORTNESS OF BREATH  *UNUSUAL BRUISING OR BLEEDING  TENDERNESS IN MOUTH AND THROAT WITH OR WITHOUT PRESENCE OF ULCERS  *URINARY PROBLEMS  *BOWEL PROBLEMS  UNUSUAL RASH Items with * indicate a potential emergency and should be followed up as soon as possible.  Feel free to call the clinic should you have any questions or concerns. The clinic phone number is [phone].  Please show the CHEMO ALERT CARD at check-in to the Emergency Department and triage nurse.   

## 2020-02-18 NOTE — Progress Notes (Signed)
Per Marlon Pel, RN, patient is ok to treat per Dr. Jana Hakim with [place] of 1.2.

## 2020-02-18 NOTE — Progress Notes (Addendum)
Ok to treat today with [place] = 1.2 today per Tivis Ringer, Therapist, sports.  Raul Del [place], [place], BCPS, BCOP 02/18/2020 11:23 AM

## 2020-02-19 ENCOUNTER — Telehealth: Payer: Self-pay | Admitting: Oncology

## 2020-02-19 NOTE — Telephone Encounter (Signed)
Scheduled appts per 2/8 los. Pt confirmed appt dates and times.

## 2020-02-20 ENCOUNTER — Inpatient Hospital Stay: Payer: Medicare HMO

## 2020-02-20 ENCOUNTER — Other Ambulatory Visit: Payer: Self-pay

## 2020-02-20 VITALS — BP 131/70 | HR 96 | Temp 99.1°F | Resp 18

## 2020-02-20 DIAGNOSIS — R5383 Other fatigue: Secondary | ICD-10-CM | POA: Diagnosis not present

## 2020-02-20 DIAGNOSIS — R011 Cardiac murmur, unspecified: Secondary | ICD-10-CM | POA: Diagnosis not present

## 2020-02-20 DIAGNOSIS — Z95828 Presence of other vascular implants and grafts: Secondary | ICD-10-CM

## 2020-02-20 DIAGNOSIS — Z171 Estrogen receptor negative status [ER-]: Secondary | ICD-10-CM | POA: Diagnosis not present

## 2020-02-20 DIAGNOSIS — Z5111 Encounter for antineoplastic chemotherapy: Secondary | ICD-10-CM | POA: Diagnosis not present

## 2020-02-20 DIAGNOSIS — Z5189 Encounter for other specified aftercare: Secondary | ICD-10-CM | POA: Diagnosis not present

## 2020-02-20 DIAGNOSIS — C50412 Malignant neoplasm of upper-outer quadrant of left female breast: Secondary | ICD-10-CM | POA: Diagnosis not present

## 2020-02-20 DIAGNOSIS — Z79899 Other long term (current) drug therapy: Secondary | ICD-10-CM | POA: Diagnosis not present

## 2020-02-20 MED ORDER — FILGRASTIM-SNDZ 480 MCG/0.8ML IJ SOSY
480.0000 ug | PREFILLED_SYRINGE | Freq: Once | INTRAMUSCULAR | Status: AC
  Administered 2020-02-20: 480 ug via SUBCUTANEOUS

## 2020-02-20 MED ORDER — FILGRASTIM-SNDZ 480 MCG/0.8ML IJ SOSY
PREFILLED_SYRINGE | INTRAMUSCULAR | Status: AC
  Filled 2020-02-20: qty 0.8

## 2020-02-21 ENCOUNTER — Inpatient Hospital Stay: Payer: Medicare HMO

## 2020-02-21 ENCOUNTER — Other Ambulatory Visit: Payer: Self-pay

## 2020-02-21 VITALS — BP 134/69 | HR 90 | Resp 18

## 2020-02-21 DIAGNOSIS — Z95828 Presence of other vascular implants and grafts: Secondary | ICD-10-CM

## 2020-02-21 DIAGNOSIS — Z79899 Other long term (current) drug therapy: Secondary | ICD-10-CM | POA: Diagnosis not present

## 2020-02-21 DIAGNOSIS — Z5189 Encounter for other specified aftercare: Secondary | ICD-10-CM | POA: Diagnosis not present

## 2020-02-21 DIAGNOSIS — Z5111 Encounter for antineoplastic chemotherapy: Secondary | ICD-10-CM | POA: Diagnosis not present

## 2020-02-21 DIAGNOSIS — C50412 Malignant neoplasm of upper-outer quadrant of left female breast: Secondary | ICD-10-CM | POA: Diagnosis not present

## 2020-02-21 DIAGNOSIS — Z171 Estrogen receptor negative status [ER-]: Secondary | ICD-10-CM | POA: Diagnosis not present

## 2020-02-21 DIAGNOSIS — R011 Cardiac murmur, unspecified: Secondary | ICD-10-CM | POA: Diagnosis not present

## 2020-02-21 DIAGNOSIS — R5383 Other fatigue: Secondary | ICD-10-CM | POA: Diagnosis not present

## 2020-02-21 MED ORDER — FILGRASTIM-SNDZ 480 MCG/0.8ML IJ SOSY
480.0000 ug | PREFILLED_SYRINGE | Freq: Once | INTRAMUSCULAR | Status: AC
  Administered 2020-02-21: 480 ug via SUBCUTANEOUS

## 2020-02-21 NOTE — Patient Instructions (Signed)

## 2020-02-24 ENCOUNTER — Other Ambulatory Visit: Payer: Self-pay

## 2020-02-24 ENCOUNTER — Ambulatory Visit
Admission: RE | Admit: 2020-02-24 | Discharge: 2020-02-24 | Disposition: A | Discharge status: Home / Self Care | Payer: Medicare HMO | Source: Ambulatory Visit | Attending: Oncology | Admitting: Oncology

## 2020-02-24 DIAGNOSIS — C50412 Malignant neoplasm of upper-outer quadrant of left female breast: Secondary | ICD-10-CM

## 2020-02-24 DIAGNOSIS — Z171 Estrogen receptor negative status [ER-]: Secondary | ICD-10-CM

## 2020-02-24 IMAGING — MR MR BREAST BILAT WO/W CM
8 of 12 series · 30 of 48 positions shown · IV contrast (9 ml of gadavist)
Comparison: Breast MRI dated [DATE]

CLINICAL DATA: Biopsy-proven invasive ductal carcinomas within the
upper-outer quadrant of the LEFT breast, 2 sites, located at the 1
o'clock axis and 1:30 o'clock axis respectively, via
ultrasound-guided biopsies performed on [DATE].
TECHNIQUE: Multiplanar, multisequence MR images of both breasts were obtained
prior to and following the intravenous administration of 9 ml of
Gadavist

[Series 2: t2_tirm_tra ipat (a-p) · axial · 3.0mm · 0.78mm/px · 1 of 64 slices shown]
[im 1/64]
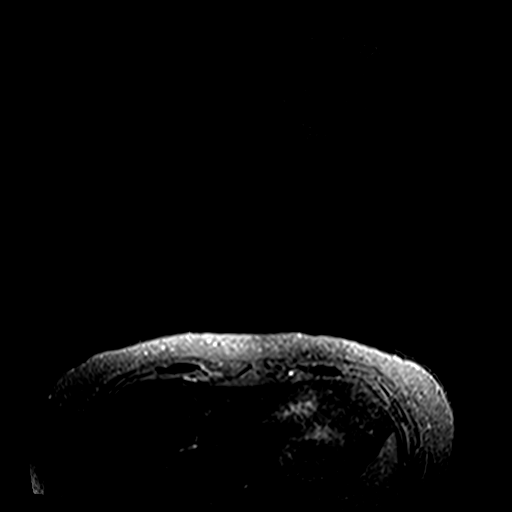

[Series 3: fl3d pre-cm no · axial · non-contrast · 1.2mm · 1.04mm/px · z∈[-69,+122]mm · 5 of 160 slices shown]
[im 1/160]
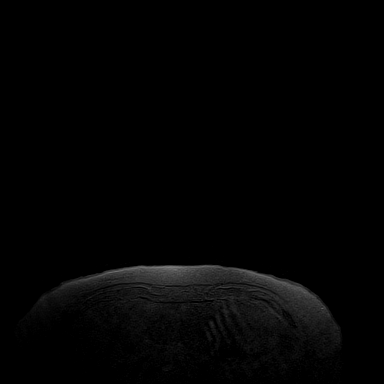
[im 40/160]
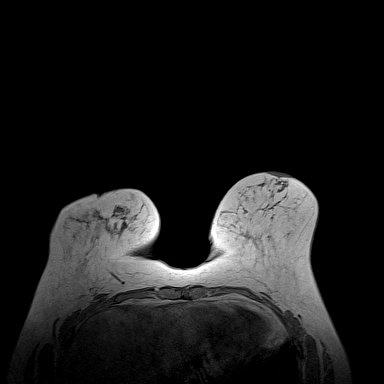
[im 80/160]
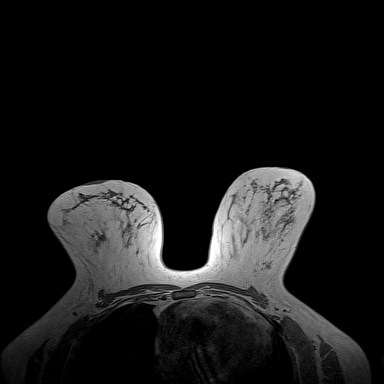
[im 120/160]
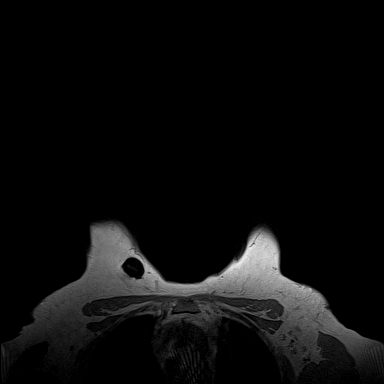
[im 160/160]
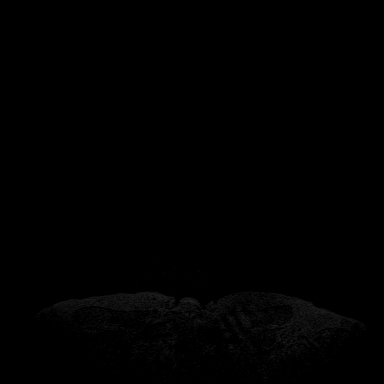

[Series 4: fl3d pre-cm · axial · non-contrast · 1.2mm · 1.04mm/px · z∈[-69,+122]mm · 5 of 160 slices shown]
[im 1/160]
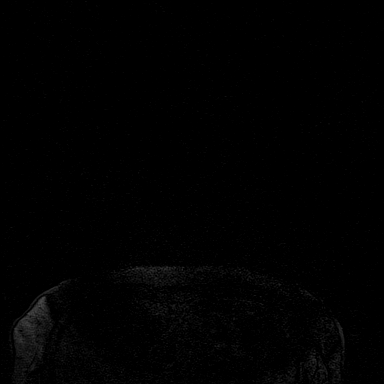
[im 40/160]
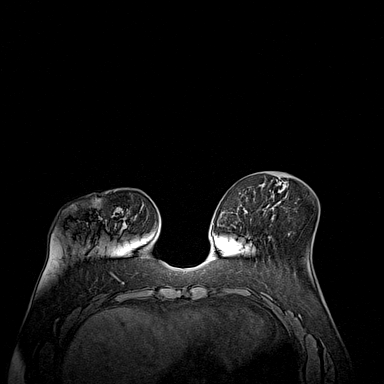
[im 80/160]
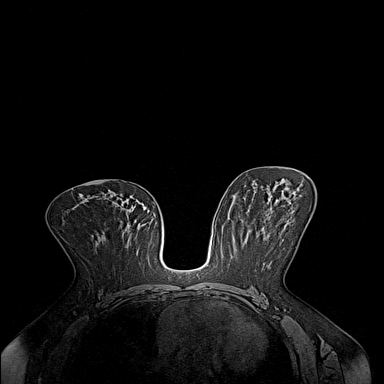
[im 120/160]
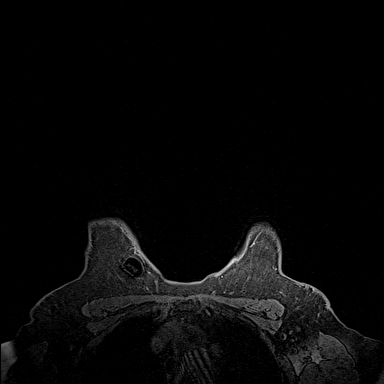
[im 160/160]
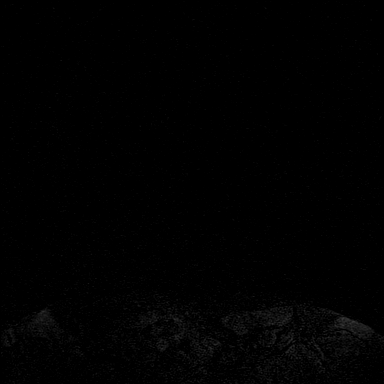

[Series 5: fl3d post-cm 20 · axial · 1.2mm · 1.04mm/px · z∈[-69,+122]mm · 5 of 160 slices shown (1 of 3)]
[im 1/160]
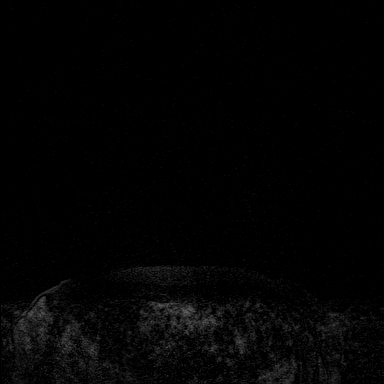
[im 40/160]
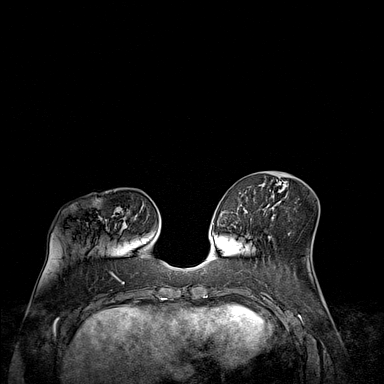
[im 80/160]
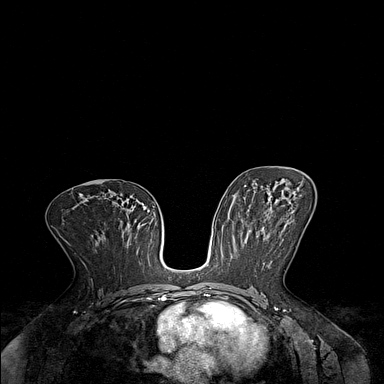
[im 120/160]
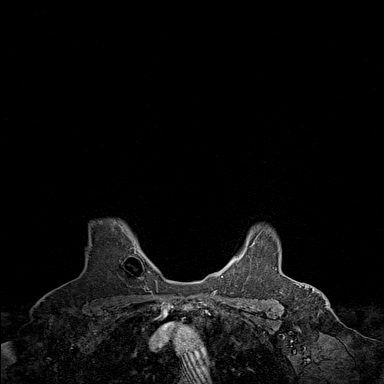
[im 160/160]
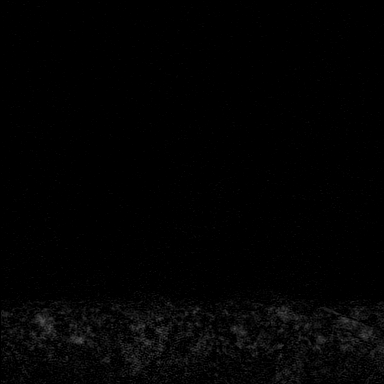

[Series 6: fl3d post-cm 20 · axial · 1.2mm · 1.04mm/px · z∈[-69,+122]mm · 5 of 160 slices shown (2 of 3)]
[im 1/160]
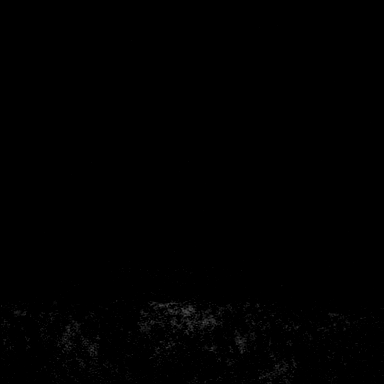
[im 40/160]
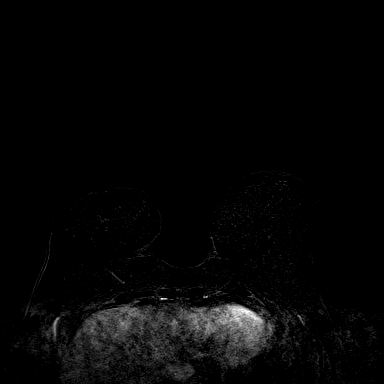
[im 80/160]
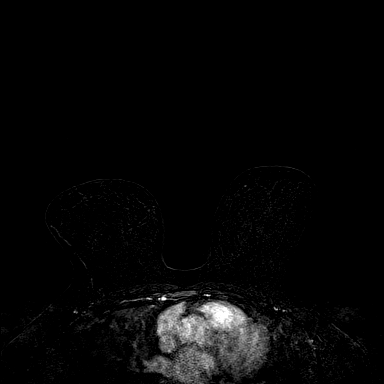
[im 120/160]
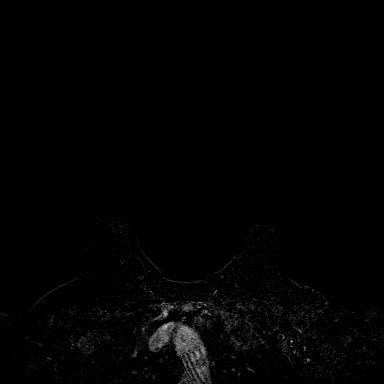
[im 160/160]
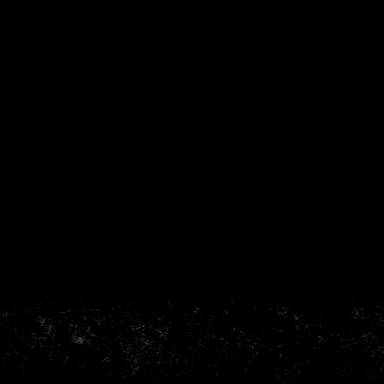

[Series 7: fl3d post-cm 20 · axial · 192.0mm · 1.04mm/px · 1 of 1 slices shown (3 of 3)]
[im 1/1]
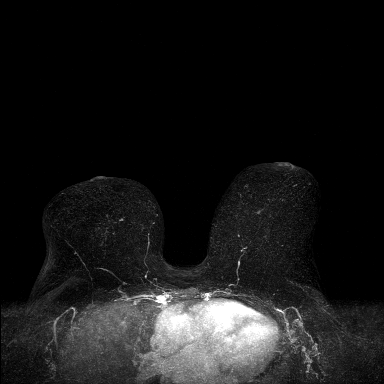

[Series 8: fl3d post-cm 3min · axial · 1.2mm · 1.04mm/px · z∈[-69,+122]mm · 6 of 160 slices shown]
[im 1/160]
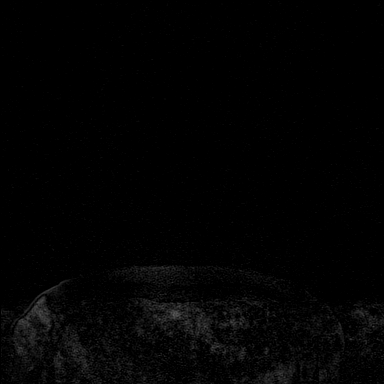
[im 32/160]
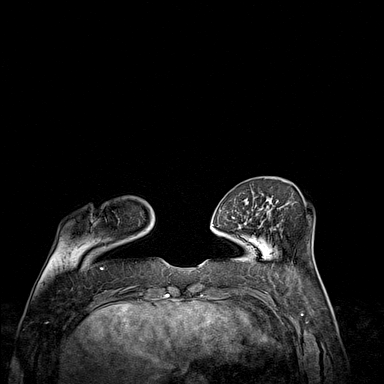
[im 64/160]
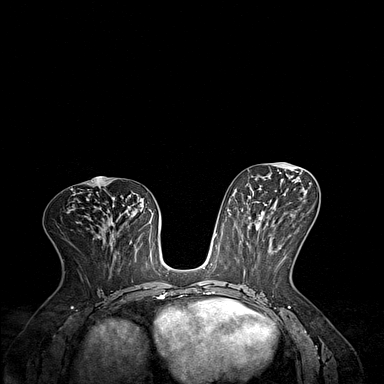
[im 96/160]
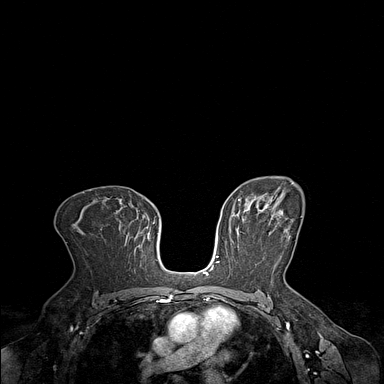
[im 128/160]
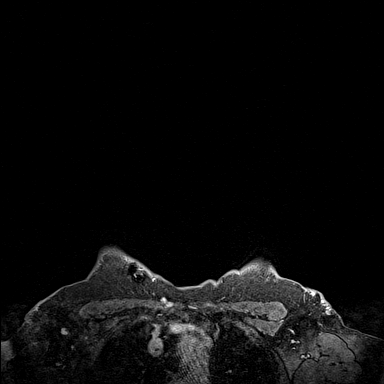
[im 160/160]
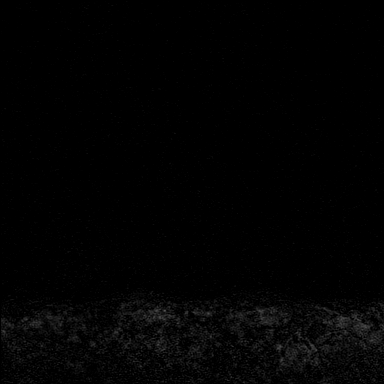

[Series 9: fl3d post-cm 3min_sub · axial · 1.2mm · 1.04mm/px · z∈[-69,-31]mm · 2 of 160 slices shown]
[im 1/160]
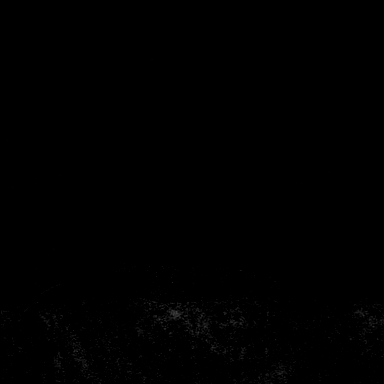
[im 32/160]
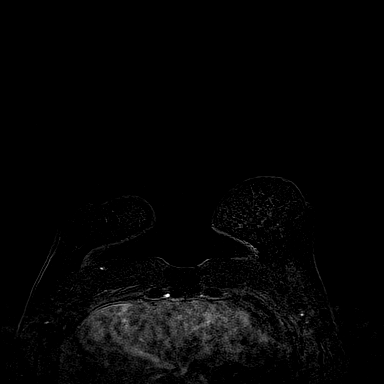

[30 of 48 positions shown; findings below may reference images not displayed]

Subsequent MRI guided biopsy performed on [DATE] of non-mass
enhancement within the upper-outer quadrant of the LEFT breast with
benign pathology result felt to be discordant and to be considered
malignant until proven otherwise.

Status post chemotherapy.  Evaluate response.

LABS:  Not performed at [REDACTED].

EXAM:
BILATERAL BREAST MRI WITH AND WITHOUT CONTRAST
Three-dimensional MR images were rendered by post-processing of the
original MR data on an independent workstation. The
three-dimensional MR images were interpreted, and findings are
reported in the following complete MRI report for this study. Three
dimensional images were evaluated at the independent interpreting
workstation using the DynaCAD thin client.
FINDINGS: Breast composition: b. Scattered fibroglandular tissue.

Background parenchymal enhancement: Mild

Right breast: 2 sites of new suspicious linear non-mass enhancement
within the lower central and lower RIGHT breast, measuring 3.2 cm
and 1.9 cm (series 9, image 105 through 107 and series 9, images 118
through 124 respectively).

Left breast: 3 biopsy site markers are appreciated within the LEFT
breast, 2 of which correspond to biopsy-proven invasive ductal
carcinomas, as detailed in the clinical data above.

Small residual non-mass enhancement within the upper central LEFT
breast, measuring approximately 1.5 cm greatest dimension,
corresponding to the third biopsy site (MRI-guided) yielding benign
pathology result felt to be discordant and to be considered
malignant until proven otherwise, significantly improved in
size/extent compared to the previous MRI.

Additional small residual non-mass enhancement within the
upper-outer quadrant of the LEFT breast, at the most posterior
biopsy site marker, but significantly improved compared to the
previous MRI.

No residual enhancement at the biopsy site marker located most
laterally within the LEFT breast.

No new enhancing masses, suspicious non-mass enhancement or
secondary signs of malignancy within the LEFT breast.

Lymph nodes: No abnormal appearing lymph nodes.

Ancillary findings:  None.
IMPRESSION: 1. Two sites of new suspicious linear non-mass enhancement within
the lower central and lower RIGHT breast, measuring 3.2 cm and
cm extent respectively, series and image numbers provided above.
MRI-guided biopsies are recommended for each site.
2. Significant interval decrease of the mass and non-mass
enhancement within the LEFT breast, corresponding to the sites of
known biopsy-proven carcinoma and the additional third biopsy site
revealing a benign pathology result felt to be discordant and to be
considered malignant until proven otherwise. This indicates a good
response to interval therapy.

RECOMMENDATION:
1. MRI-guided biopsies of the 2 sites of new suspicious linear
non-mass enhancement within the lower central and lower RIGHT
breast, measuring 3.2 cm and 1.9 cm extent respectively.
2. If breast conservation surgery is performed for the LEFT breast,
recommend excision of all 3 biopsy sites (3 biopsy clips)
corresponding to the recommendations provided on MRI biopsy report
of [DATE].

BI-RADS CATEGORY  4: Suspicious.

## 2020-02-24 MED ORDER — GADOBUTROL 1 MMOL/ML IV SOLN
9.0000 mL | Freq: Once | INTRAVENOUS | Status: AC | PRN
  Administered 2020-02-24: 9 mL via INTRAVENOUS

## 2020-02-25 ENCOUNTER — Other Ambulatory Visit: Payer: Self-pay | Admitting: Medical

## 2020-02-25 ENCOUNTER — Other Ambulatory Visit: Payer: Self-pay

## 2020-02-25 ENCOUNTER — Inpatient Hospital Stay: Payer: Medicare HMO | Admitting: Medical

## 2020-02-25 ENCOUNTER — Inpatient Hospital Stay: Payer: Medicare HMO

## 2020-02-25 ENCOUNTER — Encounter: Payer: Self-pay | Admitting: *Deleted

## 2020-02-25 ENCOUNTER — Other Ambulatory Visit: Payer: Self-pay | Admitting: Medical Oncology

## 2020-02-25 ENCOUNTER — Other Ambulatory Visit: Payer: Self-pay | Admitting: Oncology

## 2020-02-25 VITALS — BP 119/73 | HR 92 | Temp 98.8°F | Resp 17 | Ht 63.5 in | Wt 191.8 lb

## 2020-02-25 DIAGNOSIS — C50412 Malignant neoplasm of upper-outer quadrant of left female breast: Secondary | ICD-10-CM

## 2020-02-25 DIAGNOSIS — Z5189 Encounter for other specified aftercare: Secondary | ICD-10-CM | POA: Diagnosis not present

## 2020-02-25 DIAGNOSIS — Z5111 Encounter for antineoplastic chemotherapy: Secondary | ICD-10-CM | POA: Diagnosis not present

## 2020-02-25 DIAGNOSIS — R5383 Other fatigue: Secondary | ICD-10-CM | POA: Diagnosis not present

## 2020-02-25 DIAGNOSIS — Z171 Estrogen receptor negative status [ER-]: Secondary | ICD-10-CM

## 2020-02-25 DIAGNOSIS — R9389 Abnormal findings on diagnostic imaging of other specified body structures: Secondary | ICD-10-CM

## 2020-02-25 DIAGNOSIS — Z79899 Other long term (current) drug therapy: Secondary | ICD-10-CM | POA: Diagnosis not present

## 2020-02-25 DIAGNOSIS — N6310 Unspecified lump in the right breast, unspecified quadrant: Secondary | ICD-10-CM | POA: Diagnosis not present

## 2020-02-25 DIAGNOSIS — Z95828 Presence of other vascular implants and grafts: Secondary | ICD-10-CM

## 2020-02-25 DIAGNOSIS — R011 Cardiac murmur, unspecified: Secondary | ICD-10-CM | POA: Diagnosis not present

## 2020-02-25 LAB — CBC WITH DIFFERENTIAL (CANCER CENTER ONLY)
Abs Immature Granulocytes: 0.48 10*3/uL — ABNORMAL HIGH (ref 0.00–0.07)
Basophils Absolute: 0 10*3/uL (ref 0.0–0.1)
Basophils Relative: 0 %
Eosinophils Absolute: 0 10*3/uL (ref 0.0–0.5)
Eosinophils Relative: 1 %
HCT: 31.5 % — ABNORMAL LOW (ref 36.0–46.0)
Hemoglobin: 10.8 g/dL — ABNORMAL LOW (ref 12.0–15.0)
Immature Granulocytes: 13 %
Lymphocytes Relative: 20 %
Lymphs Abs: 0.7 10*3/uL (ref 0.7–4.0)
MCH: 34.1 pg — ABNORMAL HIGH (ref 26.0–34.0)
MCHC: 34.3 g/dL (ref 30.0–36.0)
MCV: 99.4 fL (ref 80.0–100.0)
Monocytes Absolute: 0.9 10*3/uL (ref 0.1–1.0)
Monocytes Relative: 26 %
Neutro Abs: 1.5 10*3/uL — ABNORMAL LOW (ref 1.7–7.7)
Neutrophils Relative %: 40 %
Platelet Count: 134 10*3/uL — ABNORMAL LOW (ref 150–400)
RBC: 3.17 MIL/uL — ABNORMAL LOW (ref 3.87–5.11)
RDW: 12.4 % (ref 11.5–15.5)
WBC Count: 3.6 10*3/uL — ABNORMAL LOW (ref 4.0–10.5)
nRBC: 0 % (ref 0.0–0.2)

## 2020-02-25 LAB — CMP (CANCER CENTER ONLY)
ALT: 12 U/L (ref 0–44)
AST: 15 U/L (ref 15–41)
Albumin: 3.8 g/dL (ref 3.5–5.0)
Alkaline Phosphatase: 70 U/L (ref 38–126)
Anion gap: 8 (ref 5–15)
BUN: 10 mg/dL (ref 8–23)
CO2: 25 mmol/L (ref 22–32)
Calcium: 9 mg/dL (ref 8.9–10.3)
Chloride: 106 mmol/L (ref 98–111)
Creatinine: 0.97 mg/dL (ref 0.44–1.00)
GFR, Estimated: >60 mL/min (ref >60)
Glucose, Bld: 91 mg/dL (ref 70–99)
Potassium: 3.7 mmol/L (ref 3.5–5.1)
Sodium: 139 mmol/L (ref 135–145)
Total Bilirubin: 0.3 mg/dL (ref 0.3–1.2)
Total Protein: 6.4 g/dL — ABNORMAL LOW (ref 6.5–8.1)

## 2020-02-25 MED ORDER — FAMOTIDINE IN NACL 20-0.9 MG/50ML-% IV SOLN
INTRAVENOUS | Status: AC
  Filled 2020-02-25: qty 50

## 2020-02-25 MED ORDER — SODIUM CHLORIDE 0.9% FLUSH
10.0000 mL | INTRAVENOUS | Status: DC | PRN
  Administered 2020-02-25: 10 mL
  Filled 2020-02-25: qty 10

## 2020-02-25 MED ORDER — SODIUM CHLORIDE 0.9 % IV SOLN
187.8000 mg | Freq: Once | INTRAVENOUS | Status: AC
Start: 2020-02-25 — End: 2020-02-25
  Administered 2020-02-25: 190 mg via INTRAVENOUS
  Filled 2020-02-25: qty 19

## 2020-02-25 MED ORDER — DIPHENHYDRAMINE HCL 50 MG/ML IJ SOLN
INTRAMUSCULAR | Status: AC
  Filled 2020-02-25: qty 1

## 2020-02-25 MED ORDER — FAMOTIDINE IN NACL 20-0.9 MG/50ML-% IV SOLN
20.0000 mg | Freq: Once | INTRAVENOUS | Status: AC
  Administered 2020-02-25: 20 mg via INTRAVENOUS

## 2020-02-25 MED ORDER — DIPHENHYDRAMINE HCL 50 MG/ML IJ SOLN
25.0000 mg | Freq: Once | INTRAMUSCULAR | Status: AC
  Administered 2020-02-25: 25 mg via INTRAVENOUS

## 2020-02-25 MED ORDER — PALONOSETRON HCL INJECTION 0.25 MG/5ML
INTRAVENOUS | Status: AC
  Filled 2020-02-25: qty 5

## 2020-02-25 MED ORDER — SODIUM CHLORIDE 0.9 % IV SOLN
80.0000 mg/m2 | Freq: Once | INTRAVENOUS | Status: AC
  Administered 2020-02-25: 156 mg via INTRAVENOUS
  Filled 2020-02-25: qty 26

## 2020-02-25 MED ORDER — PALONOSETRON HCL INJECTION 0.25 MG/5ML
0.2500 mg | Freq: Once | INTRAVENOUS | Status: AC
  Administered 2020-02-25: 0.25 mg via INTRAVENOUS

## 2020-02-25 MED ORDER — SODIUM CHLORIDE 0.9 % IV SOLN
Freq: Once | INTRAVENOUS | Status: AC
  Filled 2020-02-25: qty 250

## 2020-02-25 MED ORDER — SODIUM CHLORIDE 0.9 % IV SOLN
10.0000 mg | Freq: Once | INTRAVENOUS | Status: AC
  Administered 2020-02-25: 10 mg via INTRAVENOUS
  Filled 2020-02-25: qty 10

## 2020-02-25 MED ORDER — SODIUM CHLORIDE 0.9% FLUSH
10.0000 mL | Freq: Once | INTRAVENOUS | Status: AC
Start: 2020-02-25 — End: 2020-02-25
  Administered 2020-02-25: 10 mL
  Filled 2020-02-25: qty 10

## 2020-02-25 MED ORDER — HEPARIN SOD (PORK) LOCK FLUSH 100 UNIT/ML IV SOLN
500.0000 [IU] | Freq: Once | INTRAVENOUS | Status: AC | PRN
  Administered 2020-02-25: 500 [IU]
  Filled 2020-02-25: qty 5

## 2020-02-25 NOTE — Patient Instructions (Signed)
   [facility] Discharge Instructions for Patients Receiving Chemotherapy  Today you received the following chemotherapy agents Taxol and Carboplatin   To help prevent nausea and vomiting after your treatment, we encourage you to take your nausea medication as directed.    If you develop nausea and vomiting that is not controlled by your nausea medication, call the clinic.   BELOW ARE SYMPTOMS THAT SHOULD BE REPORTED IMMEDIATELY:  *FEVER GREATER THAN 100.5 F  *CHILLS WITH OR WITHOUT FEVER  NAUSEA AND VOMITING THAT IS NOT CONTROLLED WITH YOUR NAUSEA MEDICATION  *UNUSUAL SHORTNESS OF BREATH  *UNUSUAL BRUISING OR BLEEDING  TENDERNESS IN MOUTH AND THROAT WITH OR WITHOUT PRESENCE OF ULCERS  *URINARY PROBLEMS  *BOWEL PROBLEMS  UNUSUAL RASH Items with * indicate a potential emergency and should be followed up as soon as possible.  Feel free to call the clinic should you have any questions or concerns. The clinic phone number is [phone].  Please show the CHEMO ALERT CARD at check-in to the Emergency Department and triage nurse.   

## 2020-02-25 NOTE — Patient Instructions (Signed)
Implanted Port Insertion, Care After This sheet gives you information about how to care for yourself after your procedure. Your health care provider may also give you more specific instructions. If you have problems or questions, contact your health care provider. What can I expect after the procedure? After the procedure, it is common to have:  Discomfort at the port insertion site.  Bruising on the skin over the port. This should improve over 3-4 days. Follow these instructions at home: Port care  After your port is placed, you will get a manufacturer's information card. The card has information about your port. Keep this card with you at all times.  Take care of the port as told by your health care provider. Ask your health care provider if you or a family member can get training for taking care of the port at home. A home health care nurse may also take care of the port.  Make sure to remember what type of port you have. Incision care  Follow instructions from your health care provider about how to take care of your port insertion site. Make sure you: ? Wash your hands with soap and water before and after you change your bandage (dressing). If soap and water are not available, use hand sanitizer. ? Change your dressing as told by your health care provider. ? Leave stitches (sutures), skin glue, or adhesive strips in place. These skin closures may need to stay in place for 2 weeks or longer. If adhesive strip edges start to loosen and curl up, you may trim the loose edges. Do not remove adhesive strips completely unless your health care provider tells you to do that.  Check your port insertion site every day for signs of infection. Check for: ? Redness, swelling, or pain. ? Fluid or blood. ? Warmth. ? Pus or a bad smell.      Activity  Return to your normal activities as told by your health care provider. Ask your health care provider what activities are safe for you.  Do not  lift anything that is heavier than 10 lb (4.5 kg), or the limit that you are told, until your health care provider says that it is safe. General instructions  Take over-the-counter and prescription medicines only as told by your health care provider.  Do not take baths, swim, or use a hot tub until your health care provider approves. Ask your health care provider if you may take showers. You may only be allowed to take sponge baths.  Do not drive for 24 hours if you were given a sedative during your procedure.  Wear a medical alert bracelet in case of an emergency. This will tell any health care providers that you have a port.  Keep all follow-up visits as told by your health care provider. This is important. Contact a health care provider if:  You cannot flush your port with saline as directed, or you cannot draw blood from the port.  You have a fever or chills.  You have redness, swelling, or pain around your port insertion site.  You have fluid or blood coming from your port insertion site.  Your port insertion site feels warm to the touch.  You have pus or a bad smell coming from the port insertion site. Get help right away if:  You have chest pain or shortness of breath.  You have bleeding from your port that you cannot control. Summary  Take care of the port as told by your   health care provider. Keep the manufacturer's information card with you at all times.  Change your dressing as told by your health care provider.  Contact a health care provider if you have a fever or chills or if you have redness, swelling, or pain around your port insertion site.  Keep all follow-up visits as told by your health care provider. This information is not intended to replace advice given to you by your health care provider. Make sure you discuss any questions you have with your health care provider. Document Revised: 07/25/2017 Document Reviewed: 07/25/2017 Elsevier Patient Education   2021 [employer]vier Inc.  

## 2020-02-25 NOTE — Progress Notes (Signed)
Symptoms Management Clinic Progress Note   Cheyenne Mcdonald [id number] 1950/07/07 70 y.o.  Cheyenne Mcdonald is managed by Dr. Lurline Del  Actively treated with chemotherapy/immunotherapy/hormonal therapy: yes  Current therapy: Carboplatin and paclitaxel  Last treated: 02/18/2020 (cycle 9, day 1)  Next scheduled appointment with provider: to be scheduled   Assessment: Plan:    Malignant neoplasm of upper-outer quadrant of left breast in female, estrogen receptor negative ([place]) - Plan: CBC with Differential ([place] Only), CMP ([place] only)  Breast mass, right - Plan: MM RT BREAST BX W LOC DEV 1ST LESION IMAGE BX Mono   ER negative malignant neoplasm of the left breast: The patient presents to clinic today status post cycle 9, day 1 of carboplatin and paclitaxel.  Labs were reviewed. We will proceed with cycle # 9, day # 1 of carboplatin and paclitaxel today. She will return in one week for consideration of cycle # 10, day # 1 of carboplatin and paclitaxel, labs, and will be seen by Dr. Jana Hakim.   Two new right breast masses: An MRI from 02/24/2020 returned showing:  IMPRESSION: 1. Two sites of new suspicious linear non-mass enhancement withinthe lower central and lower RIGHT breast, measuring 3.2 cm and 1.9 cm extent respectively, series and image numbers provided above. MRI-guided biopsies are recommended for each site. 2. Significant interval decrease of the mass and non-mass enhancement within the LEFT breast, corresponding to the sites of known biopsy-proven carcinoma and the additional third biopsy site revealing a benign pathology result felt to be discordant and to be considered malignant until proven otherwise. This indicates a good response to interval therapy.  RECOMMENDATION: 1. MRI-guided biopsies of the 2 sites of new suspicious linear non-mass enhancement within the lower central and lower RIGHT breast, measuring 3.2 cm and 1.9 cm extent  respectively. 2. If breast conservation surgery is performed for the LEFT breast, recommend excision of all 3 biopsy sites (3 biopsy clips) corresponding to the recommendations provided on MRI biopsy report of 11/01/2019.  Mrs. Steinke has been referred for a breast biopsy x 2. This will be done on 02/26/2020. She will be seen by Dr. Jana Hakim in follow up in one week  Please see After Visit Summary for patient specific instructions.  No future appointments.  Orders Placed This Encounter  Procedures  . MM RT BREAST BX W LOC DEV 1ST LESION IMAGE BX SPEC STEREO GUIDE  . CBC with Differential ([place] Only)  . CMP ([place] only)       Subjective:   Patient ID:  Cheyenne Mcdonald is a 70 y.o. (DOB 07-Jun-1950) female.  Chief Complaint: No chief complaint on file.   HPI Cheyenne Mcdonald  is a 70 y.o. female with a diagnosis of an ER negative malignant neoplasm of the left breast.  She is followed by Dr. Jana Hakim and is status post cycle 9, day 1 of carboplatin and paclitaxel.  She presents to the clinic today for consideration of cycle 10, day 1 of chemotherapy. She is status post a bilateral breast MRI on 02/24/2020 which returned showing:  FINDINGS: Breast composition: b. Scattered fibroglandular tissue.  Background parenchymal enhancement: Mild  Right breast: 2 sites of new suspicious linear non-mass enhancement within the lower central and lower RIGHT breast, measuring 3.2 cm and 1.9 cm (series 9, image 105 through 107 and series 9, images 118 through 124 respectively).  Left breast: 3 biopsy site markers are appreciated within the LEFT breast, 2  of which correspond to biopsy-proven invasive ductal carcinomas, as detailed in the clinical data above.  Small residual non-mass enhancement within the upper central LEFT breast, measuring approximately 1.5 cm greatest dimension, corresponding to the third biopsy site (MRI-guided) yielding benign pathology result felt to be  discordant and to be considered malignant until proven otherwise, significantly improved in size/extent compared to the previous MRI.  Additional small residual non-mass enhancement within the upper-outer quadrant of the LEFT breast, at the most posterior biopsy site marker, but significantly improved compared to the previous MRI.  No residual enhancement at the biopsy site marker located most laterally within the LEFT breast.  No new enhancing masses, suspicious non-mass enhancement or secondary signs of malignancy within the LEFT breast.  Lymph nodes: No abnormal appearing lymph nodes.  Ancillary findings:  None.  IMPRESSION: 1. Two sites of new suspicious linear non-mass enhancement within the lower central and lower RIGHT breast, measuring 3.2 cm and 1.9 cm extent respectively, series and image numbers provided above. MRI-guided biopsies are recommended for each site. 2. Significant interval decrease of the mass and non-mass enhancement within the LEFT breast, corresponding to the sites of known biopsy-proven carcinoma and the additional third biopsy site revealing a benign pathology result felt to be discordant and to be considered malignant until proven otherwise. This indicates a good response to interval therapy.  RECOMMENDATION: 1. MRI-guided biopsies of the 2 sites of new suspicious linear non-mass enhancement within the lower central and lower RIGHT breast, measuring 3.2 cm and 1.9 cm extent respectively. 2. If breast conservation surgery is performed for the LEFT breast, recommend excision of all 3 biopsy sites (3 biopsy clips) corresponding to the recommendations provided on MRI biopsy report of 11/01/2019.  Her only report today is that she continues to have fatigue. She denies any additional issues of concern.  Medications: I have reviewed the patient's current medications.  Allergies:  Allergies  Allergen Reactions  . Other Itching  . Shellfish Allergy Itching     Past Medical History:  Diagnosis Date  . Breast cancer ([place])   . Obesity   . Sleep apnea     Past Surgical History:  Procedure Laterality Date  . ABDOMINAL HYSTERECTOMY    . IR IMAGING GUIDED PORT INSERTION  10/18/2019    Family History  Problem Relation Age of Onset  . Breast cancer Cousin 75       maternal cousin; bilateral     Social History   Socioeconomic History  . Marital status: Married    Spouse name: Not on file  . Number of children: Not on file  . Years of education: Not on file  . Highest education level: Not on file  Occupational History  . Not on file  Tobacco Use  . Smoking status: Never Smoker  . Smokeless tobacco: Never Used  Substance and Sexual Activity  . Alcohol use: Never  . Drug use: Never  . Sexual activity: Not on file  Other Topics Concern  . Not on file  Social History Narrative  . Not on file   Social Determinants of Health   Financial Resource Strain: Not on file  Food Insecurity: Not on file  Transportation Needs: Not on file  Physical Activity: Not on file  Stress: Not on file  Social Connections: Not on file  Intimate Partner Violence: Not on file    Past Medical History, Surgical history, Social history, and Family history were reviewed and updated as appropriate.   Please see review  of systems for further details on the patient's review from today.   Review of Systems:  Review of Systems  Constitutional: Positive for fatigue. Negative for appetite change, chills, diaphoresis and fever.  HENT: Negative for trouble swallowing.   Respiratory: Negative for cough, choking, shortness of breath and wheezing.   Cardiovascular: Negative for chest pain and palpitations.  Gastrointestinal: Negative for constipation, diarrhea, nausea and vomiting.  Genitourinary: Negative for decreased urine volume.  Neurological: Negative for headaches.    Objective:   Physical Exam:  BP 119/73 (BP Location: Left Arm, Patient Position:  Sitting)   Pulse 92   Temp 98.8 F (37.1 C) (Tympanic)   Resp 17   Ht 5' 3.5" (1.613 m)   Wt 191 lb 12.8 oz (87 kg)   SpO2 100%   BMI 33.44 kg/m  ECOG: 0  Physical Exam Constitutional:      General: She is not in acute distress.    Appearance: She is not diaphoretic.  HENT:     Head: Normocephalic and atraumatic.  Eyes:     General: No scleral icterus.       Right eye: No discharge.        Left eye: No discharge.     Conjunctiva/sclera: Conjunctivae normal.  Cardiovascular:     Rate and Rhythm: Normal rate and regular rhythm.     Heart sounds: Normal heart sounds. No murmur heard. No friction rub. No gallop.   Pulmonary:     Effort: Pulmonary effort is normal. No respiratory distress.     Breath sounds: Normal breath sounds. No wheezing or rales.  Musculoskeletal:     Right lower leg: No edema.     Left lower leg: No edema.  Skin:    General: Skin is warm and dry.     Findings: No erythema or rash.  Neurological:     Mental Status: She is alert.     Coordination: Coordination normal.     Gait: Gait normal.  Psychiatric:        Mood and Affect: Mood normal.        Behavior: Behavior normal.        Thought Content: Thought content normal.        Judgment: Judgment normal.     Lab Review:     Component Value Date/Time   NA 139 02/25/2020 1103   K 3.7 02/25/2020 1103   CL 106 02/25/2020 1103   CO2 25 02/25/2020 1103   GLUCOSE 91 02/25/2020 1103   BUN 10 02/25/2020 1103   CREATININE 0.97 02/25/2020 1103   CALCIUM 9.0 02/25/2020 1103   PROT 6.4 (L) 02/25/2020 1103   ALBUMIN 3.8 02/25/2020 1103   AST 15 02/25/2020 1103   ALT 12 02/25/2020 1103   ALKPHOS 70 02/25/2020 1103   BILITOT 0.3 02/25/2020 1103   GFRNONAA >60 02/25/2020 1103   GFRAA 58 (L) 10/09/2019 0806       Component Value Date/Time   WBC 3.6 (L) 02/25/2020 1103   WBC 5.4 10/18/2019 1005   RBC 3.17 (L) 02/25/2020 1103   HGB 10.8 (L) 02/25/2020 1103   HCT 31.5 (L) 02/25/2020 1103   PLT  134 (L) 02/25/2020 1103   MCV 99.4 02/25/2020 1103   MCH 34.1 (H) 02/25/2020 1103   MCHC 34.3 02/25/2020 1103   RDW 12.4 02/25/2020 1103   LYMPHSABS 0.7 02/25/2020 1103   MONOABS 0.9 02/25/2020 1103   EOSABS 0.0 02/25/2020 1103   BASOSABS 0.0 02/25/2020 1103   -------------------------------  Imaging from last 24 hours (if applicable):  Radiology interpretation: MR BREAST BILATERAL W WO CONTRAST INC CAD  Result Date: 02/24/2020 CLINICAL DATA:  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 Subsequent MRI guided biopsy performed on 11/01/2019 of non-mass enhancement within the upper-outer quadrant of the LEFT breast with benign pathology result felt to be discordant and to be considered malignant until proven otherwise. Status post chemotherapy.  Evaluate response. LABS:  Not performed at imaging center. EXAM: BILATERAL BREAST MRI WITH AND WITHOUT CONTRAST TECHNIQUE: Multiplanar, multisequence MR images of both breasts were obtained prior to and following the intravenous administration of 9 ml of Gadavist Three-dimensional MR images were rendered by post-processing of the original MR data on an independent workstation. The three-dimensional MR images were interpreted, and findings are reported in the following complete MRI report for this study. Three dimensional images were evaluated at the independent interpreting workstation using the DynaCAD thin client. COMPARISON:  Breast MRI dated 10/21/2019 FINDINGS: Breast composition: b. Scattered fibroglandular tissue. Background parenchymal enhancement: Mild Right breast: 2 sites of new suspicious linear non-mass enhancement within the lower central and lower RIGHT breast, measuring 3.2 cm and 1.9 cm (series 9, image 105 through 107 and series 9, images 118 through 124  respectively). Left breast: 3 biopsy site markers are appreciated within the LEFT breast, 2 of which correspond to biopsy-proven invasive ductal carcinomas, as detailed in the clinical data above. Small residual non-mass enhancement within the upper central LEFT breast, measuring approximately 1.5 cm greatest dimension, corresponding to the third biopsy site (MRI-guided) yielding benign pathology result felt to be discordant and to be considered malignant until proven otherwise, significantly improved in size/extent compared to the previous MRI. Additional small residual non-mass enhancement within the upper-outer quadrant of the LEFT breast, at the most posterior biopsy site marker, but significantly improved compared to the previous MRI. No residual enhancement at the biopsy site marker located most laterally within the LEFT breast. No new enhancing masses, suspicious non-mass enhancement or secondary signs of malignancy within the LEFT breast. Lymph nodes: No abnormal appearing lymph nodes. Ancillary findings:  None. IMPRESSION: 1. Two sites of new suspicious linear non-mass enhancement within the lower central and lower RIGHT breast, measuring 3.2 cm and 1.9 cm extent respectively, series and image numbers provided above. MRI-guided biopsies are recommended for each site. 2. Significant interval decrease of the mass and non-mass enhancement within the LEFT breast, corresponding to the sites of known biopsy-proven carcinoma and the additional third biopsy site revealing a benign pathology result felt to be discordant and to be considered malignant until proven otherwise. This indicates a good response to interval therapy. RECOMMENDATION: 1. MRI-guided biopsies of the 2 sites of new suspicious linear non-mass enhancement within the lower central and lower RIGHT breast, measuring 3.2 cm and 1.9 cm extent respectively. 2. If breast conservation surgery is performed for the LEFT breast, recommend excision of all 3  biopsy sites (3 biopsy clips) corresponding to the recommendations provided on MRI biopsy report of 11/01/2019. BI-RADS CATEGORY  4: Suspicious. Electronically Signed   By: Franki Cabot M.D.   On: 02/24/2020 13:14        This case was discussed with Dr. Jana Hakim. MRI results from 02/24/2020 were reviewed with Dr. Jana Hakim. He expressed his agreement with my management of this patient.

## 2020-02-25 NOTE — Progress Notes (Signed)
OK to treat.  Jovani Flury, MHS, PA-C Physician Assistant 

## 2020-02-27 ENCOUNTER — Ambulatory Visit
Admission: RE | Admit: 2020-02-27 | Discharge: 2020-02-27 | Disposition: A | Discharge status: Home / Self Care | Payer: Medicare HMO | Source: Ambulatory Visit | Attending: Medical | Admitting: Medical

## 2020-02-27 ENCOUNTER — Other Ambulatory Visit: Payer: Self-pay

## 2020-02-27 ENCOUNTER — Other Ambulatory Visit (HOSPITAL_COMMUNITY): Payer: Self-pay | Admitting: Diagnostic Radiology

## 2020-02-27 DIAGNOSIS — D0511 Intraductal carcinoma in situ of right breast: Secondary | ICD-10-CM | POA: Diagnosis not present

## 2020-02-27 DIAGNOSIS — R928 Other abnormal and inconclusive findings on diagnostic imaging of breast: Secondary | ICD-10-CM | POA: Diagnosis not present

## 2020-02-27 DIAGNOSIS — R9389 Abnormal findings on diagnostic imaging of other specified body structures: Secondary | ICD-10-CM

## 2020-02-27 IMAGING — MG MM BREAST LOCALIZATION CLIP
4 series · 4 of 12 positions shown · non-contrast
Comparison: Previous exam(s).

CLINICAL DATA: Confirmation clip placement after MRI guided core
needle biopsies of 2 foci of non-mass enhancement involving the
LOWER RIGHT breast.

EXAM:
2D AND TOMOSYNTHESIS DIAGNOSTIC RIGHT MAMMOGRAM POST MRI BIOPSY

[R ML synth-2D]
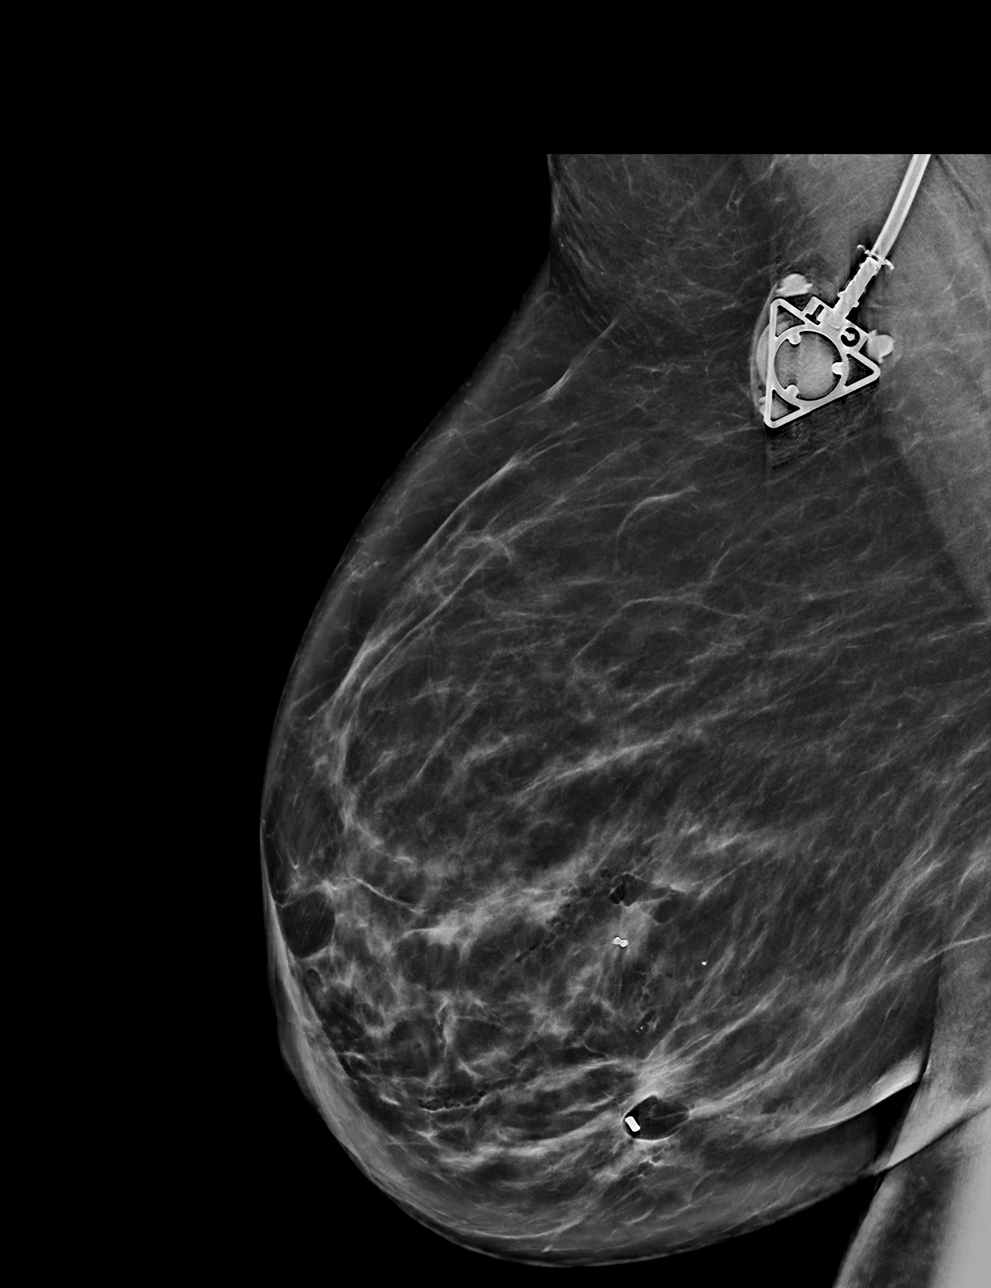

[R CC synth-2D]
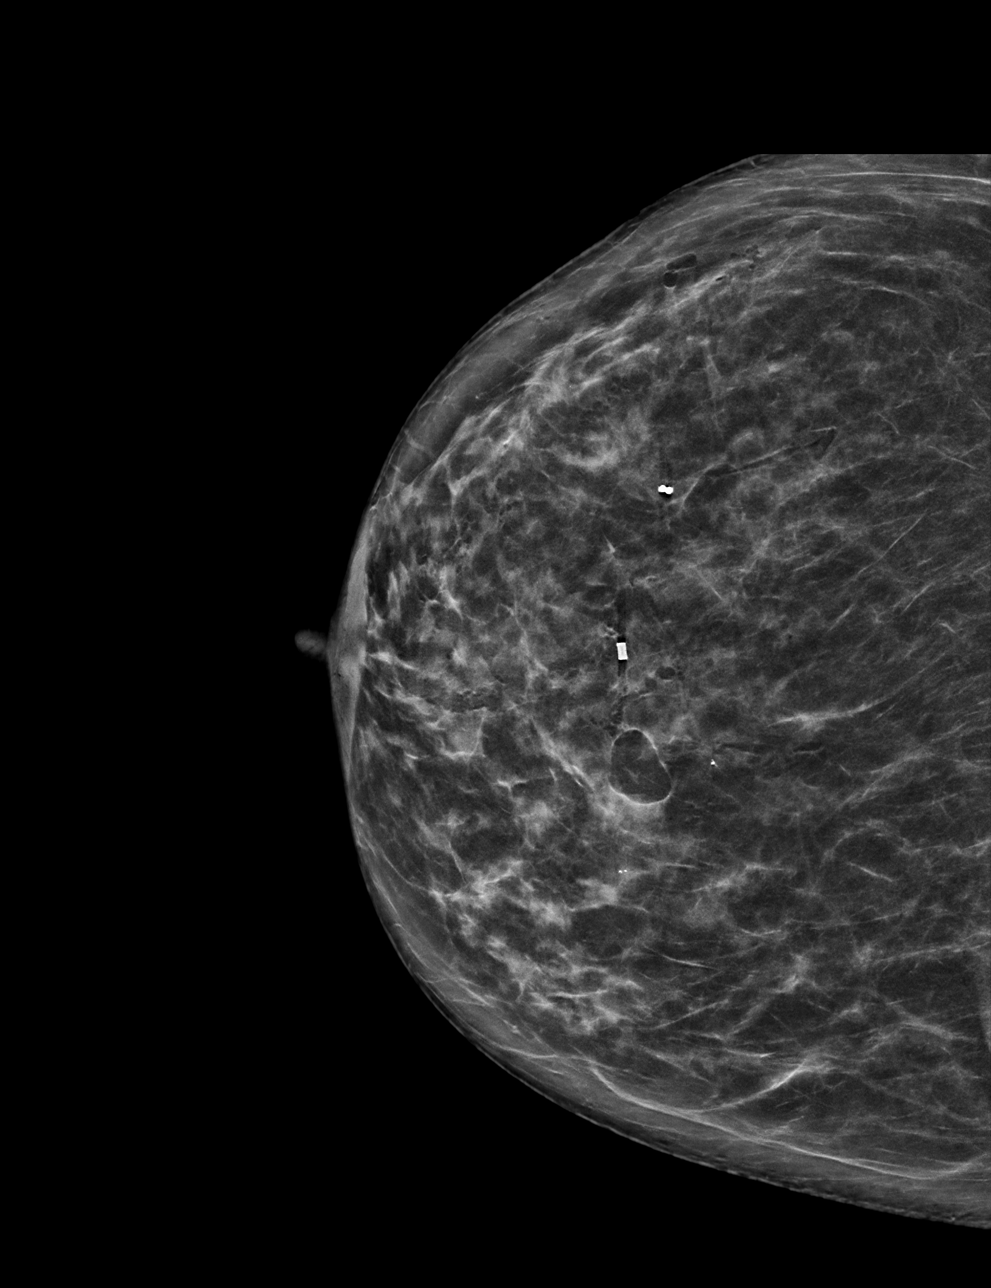

[R ML tomo · tomo slice 39/77.0]
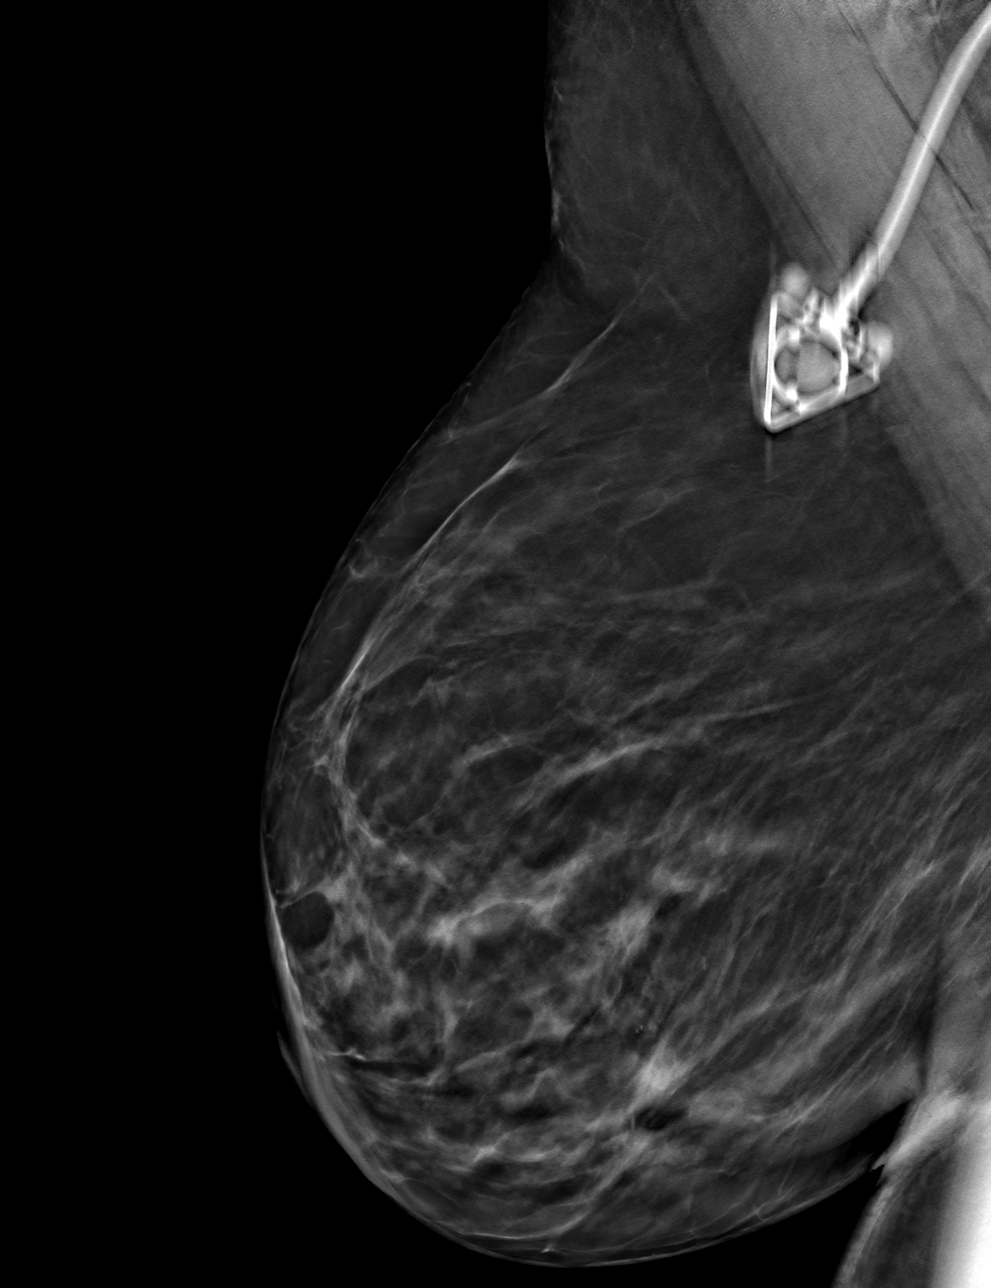

[R CC tomo · tomo slice 34/67.0]
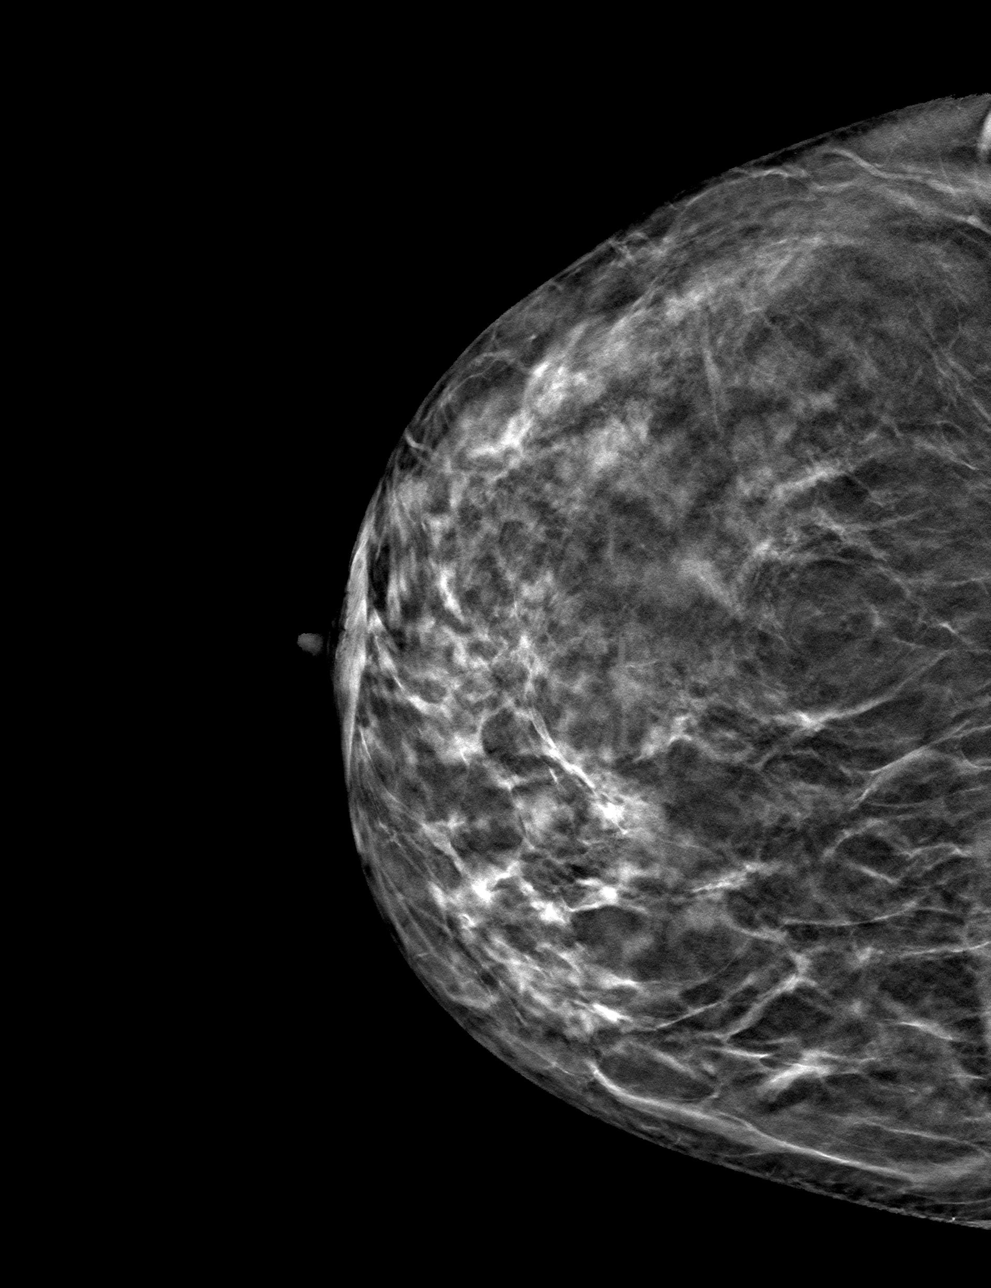

[4 of 12 positions shown; findings below may reference images not displayed]

FINDINGS: Tomosynthesis and synthesized full field CC and mediolateral images
were obtained following MRI guided biopsy of 2 areas of non-mass
enhancement involving the LOWER RIGHT breast.

The dumbbell-shaped tissue marking clip is appropriately positioned
at the site of the biopsied non-mass enhancement in the LOWER breast
at MIDDLE depth, slight LOWER OUTER QUADRANT.

The cylinder shaped tissue marker clip is appropriately positioned
at the site of the biopsied non-mass enhancement in the LOWER breast
at ANTERIOR to MIDDLE depth, near 6 o'clock location.

Expected post biopsy changes are present at both sites without
evidence of hematoma.
IMPRESSION: 1. Appropriate positioning of the dumbbell shaped tissue marking
clip at the site of biopsy in the LOWER RIGHT breast at MIDDLE
depth, slight LOWER OUTER QUADRANT.
2. Appropriate positioning of the cylinder shaped tissue marker clip
at the site of biopsy in the LOWER RIGHT breast at ANTERIOR to
MIDDLE depth, near 6 o'clock location.

Final Assessment: Post Procedure Mammograms for Marker Placement

## 2020-02-27 MED ORDER — GADOBUTROL 1 MMOL/ML IV SOLN
8.0000 mL | Freq: Once | INTRAVENOUS | Status: AC | PRN
  Administered 2020-02-27: 8 mL via INTRAVENOUS

## 2020-02-28 ENCOUNTER — Telehealth: Payer: Self-pay | Admitting: Oncology

## 2020-02-28 ENCOUNTER — Encounter: Payer: Self-pay | Admitting: *Deleted

## 2020-02-28 NOTE — Telephone Encounter (Signed)
Scheduled per 02/15 los, patient has been called and notified.

## 2020-03-02 MED FILL — Dexamethasone Sodium Phosphate Inj 100 MG/10ML: INTRAMUSCULAR | Qty: 1 | Status: AC

## 2020-03-02 NOTE — Progress Notes (Signed)
°[facility]  °[phone] [phone]  ° ° ° °ID: Cheyenne Mcdonald DOB: 02/03/1950  MR#: [id number]  CSN#:[id number] ° °Patient Care Team: °Osei-Bonsu, George, MD as PCP - General (Internal Medicine) °Cousins, Sheronette, MD as Consulting Physician (Obstetrics and Gynecology) °Stuart, Dawn C, RN as Oncology Nurse Navigator °Martini, Keisha N, RN as Oncology Nurse Navigator °Blackman, Douglas, MD as Consulting Physician (General Surgery) °Magrinat, Gustav C, MD as Consulting Physician (Oncology) °Kinard, James, MD as Consulting Physician (Radiation Oncology) °Igwemezie, Benjamin, MD as Consulting Physician (Nephrology) °Gustav C Magrinat, MD °OTHER MD: ° °CHIEF COMPLAINT: triple negative breast cancer ° °CURRENT TREATMENT: Definitive surgery pending ° ° °INTERVAL HISTORY: °[name] returns today for follow up and treatment of her triple negative breast cancer.  She is accompanied by her husband ° °Since her last visit, she underwent breast MRI on 02/24/2020 showing: breast composition B; two sites of new suspicious linear non-mass enhancement within lower-central and lower right breast, measuring 3.2 cm and 1.9 cm; significant interval decrease of mass and non-mass enhancement within left breast. ° °She proceeded to biopsies of the right breast areas in question on 02/27/2020. Pathology from the procedure ([id number]) showed: °1. Right Breast, lower superior ° - ductal carcinoma in situ, low grade ° - incidental papilloma with atypia °2. Right Breast, lower inferior ° - ductal carcinoma in situ, low grade °Prognostic panel is pending. ° °She has been receiving weekly paclitaxel and carboplatin, to be repeated x12, started on 12/17/2019. She is scheduled for week 11 today. However she tells me that she is having fairly definite although grade 1 numbness in her finger pads and toe pads. I think we will need to stop her mid chemotherapy secondary to her developing neuropathy. ° °Her most recent  echocardiogram from 10/17/2019 showed an ejection fraction of 60-65%.  She is not scheduled for repeat echo at this point. ° ° °REVIEW OF SYSTEMS: °Kelda is doing well quite aside from the neuropathy issues. She is trying to exercise regularly. She has had no mouth sores, unusual headaches visual changes cough phlegm production pleurisy shortness of breath or other symptoms related to her treatment. Her port has worked well also. ° ° COVID 19 VACCINATION STATUS: Status post Pfizer x2 with booster October 2021 ° ° °HISTORY OF CURRENT ILLNESS: °From the original intake note: ° °Cheyenne Mcdonald presented for her routine mammography with a palpable left breast lump. She underwent bilateral diagnostic mammography with tomography and left breast ultrasonography at [facility] on 09/13/2019 showing: breast density category B; two adjacent masses in left breast at 1 o'clock, spanning approximately 3.3 cm; one indeterminate left axillary lymph node with 0.4 cm cortical bulge; no evidence right breast malignancy. ° °Accordingly on 09/27/2019 she proceeded to biopsy of the left breast areas in question. The pathology from this procedure ([id number]) showed: invasive ductal carcinoma, grade 3, present in both of the adjacent masses. Prognostic indicators significant for: estrogen receptor, 0% negative and progesterone receptor, 0% negative. Proliferation marker Ki67 at 90%. HER2 equivocal by immunohistochemistry (2+), but negative by fluorescent in situ hybridization with a signals ratio 2.2 and number per cell 3.3. Additional analysis of more cells showed a signals ratio 2.05 and number per cell 3.13. ° °The biopsied lymph node was negative for carcinoma.  This was felt to be concordant ° °The patient's subsequent history is as detailed below. ° ° °PAST MEDICAL HISTORY: °Past Medical History:  °Diagnosis Date  °• Breast cancer (HCC)   °• Obesity   °•   Sleep apnea   °heart murmur (since childhood) ° ° °PAST SURGICAL  HISTORY: °Past Surgical History:  °Procedure Laterality Date  °• ABDOMINAL HYSTERECTOMY    °• IR IMAGING GUIDED PORT INSERTION  10/18/2019  ° ° °FAMILY HISTORY: °Family History  °Problem Relation Age of Onset  °• Breast cancer Cousin 45  °     maternal cousin; bilateral   ° Her father died at age 82, cause of death unknown. Her mother is age 91 as of 09/2019. Cheyenne Mcdonald has two brothers (and no sisters). She reports one cousin with breast cancer at age 71. ° ° °GYNECOLOGIC HISTORY:  °No LMP recorded. Patient has had a hysterectomy. °Menarche: 70 years old °Age at first live birth: 70 years old °GX P 2 °LMP 1990 °Contraceptive: used for maybe 20 years °HRT: never used  °Hysterectomy? Yes, 1990 °BSO? 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 Husband [name] is retired military and then retired magistrate.  At home is just the 2 of them. Daughter Michelle, age 45, works in data entry in [place]. Son Mark, age 42, has a college degree but works for [employer] in [place]. Kalanie has four grandchildren. She attends [employer]. °  ° ADVANCED DIRECTIVES: In place ° ° °HEALTH MAINTENANCE: °Social History  ° °Tobacco Use  °• Smoking status: Never Smoker  °• Smokeless tobacco: Never Used  °Substance Use Topics  °• Alcohol use: Never  °• Drug use: Never  ° ° ° Colonoscopy: 2018 (Dr. Mann) ° PAP: approx. 2013 ° Bone density: 2016, "normal" °  °Allergies  °Allergen Reactions  °• Other Itching  °• Shellfish Allergy Itching  ° ° °Current Outpatient Medications  °Medication Sig Dispense Refill  °• Cholecalciferol 125 MCG (5000 UT) capsule Take by mouth.    °• lidocaine-prilocaine (EMLA) cream Apply to affected area once (Patient not taking: Reported on 02/04/2020) 30 g 3  °• loratadine (CLARITIN) 10 MG tablet Take 1 tablet (10 mg total) by mouth daily. (Patient not taking: Reported on 02/04/2020) 40 tablet 0  °• LORazepam (ATIVAN) 0.5 MG tablet Take 1 tablet  (0.5 mg total) by mouth at bedtime as needed (Nausea or vomiting). (Patient not taking: Reported on 02/04/2020) 20 tablet 0  °• prochlorperazine (COMPAZINE) 10 MG tablet Take 1 tablet (10 mg total) by mouth every 6 (six) hours as needed (Nausea or vomiting). (Patient not taking: Reported on 02/04/2020) 30 tablet 1  ° °No current facility-administered medications for this visit.  ° ° °OBJECTIVE: African-American woman wearing a very becoming white with ° °Vitals:  ° 03/03/20 0853  °BP: 128/66  °Pulse: (!) 115  °Resp: 18  °Temp: (!) 97.5 °F (36.4 °C)  °SpO2: 100%  °   Body mass index is 32.97 kg/m².   °Wt Readings from Last 3 Encounters:  °03/03/20 189 lb 1.6 oz (85.8 kg)  °02/25/20 191 lb 12.8 oz (87 kg)  °02/18/20 197 lb 14.4 oz (89.8 kg)  ° ° °  ECOG FS:1 - Symptomatic but completely ambulatory ° °Sclerae unicteric, EOMs intact °Wearing a mask °No cervical or supraclavicular adenopathy °Lungs no rales or rhonchi °Heart regular rate and rhythm °Abd soft, nontender, positive bowel sounds °MSK no focal spinal tenderness, no upper extremity lymphedema °Neuro: nonfocal, well oriented, appropriate affect °Breasts: I do not palpate a mass in the left breast. Both axillae are benign. ° °LAB RESULTS: ° °CMP  °   °Component Value Date/Time  °   NA 137 03/03/2020 0839  ° K 3.9 03/03/2020 0839  ° CL 106 03/03/2020 0839  ° CO2 22 03/03/2020 0839  ° GLUCOSE 101 (H) 03/03/2020 0839  ° BUN 14 03/03/2020 0839  ° CREATININE 1.05 (H) 03/03/2020 0839  ° CALCIUM 9.0 03/03/2020 0839  ° PROT 6.7 03/03/2020 0839  ° ALBUMIN 4.0 03/03/2020 0839  ° AST 13 (L) 03/03/2020 0839  ° ALT 11 03/03/2020 0839  ° ALKPHOS 62 03/03/2020 0839  ° BILITOT 0.4 03/03/2020 0839  ° GFRNONAA 57 (L) 03/03/2020 0839  ° GFRAA 58 (L) 10/09/2019 0806  ° ° °No results found for: TOTALPROTELP, ALBUMINELP, A1GS, A2GS, BETS, BETA2SER, GAMS, MSPIKE, SPEI ° °Lab Results  °Component Value Date  ° WBC 2.4 (L) 03/03/2020  ° NEUTROABS 1.5 (L) 03/03/2020  ° HGB 10.5 (L) 03/03/2020   ° HCT 29.4 (L) 03/03/2020  ° MCV 97.4 03/03/2020  ° PLT 144 (L) 03/03/2020  ° ° °No results found for: LABCA2 ° °No components found for: LABCAN125 ° °No results for input(s): INR in the last 168 hours. ° °No results found for: LABCA2 ° °No results found for: CAN199 ° °No results found for: CAN125 ° °No results found for: CAN153 ° °No results found for: CA2729 ° °No components found for: HGQUANT ° °No results found for: CEA1 / No results found for: CEA1  ° °No results found for: AFPTUMOR ° °No results found for: CHROMOGRNA ° °No results found for: KPAFRELGTCHN, LAMBDASER, KAPLAMBRATIO °(kappa/lambda light chains) ° °No results found for: HGBA, HGBA2QUANT, HGBFQUANT, HGBSQUAN °(Hemoglobinopathy evaluation)  ° °No results found for: LDH ° °No results found for: IRON, TIBC, IRONPCTSAT °(Iron and TIBC) ° °No results found for: FERRITIN ° °Urinalysis °No results found for: COLORURINE, APPEARANCEUR, LABSPEC, PHURINE, GLUCOSEU, HGBUR, BILIRUBINUR, KETONESUR, PROTEINUR, UROBILINOGEN, NITRITE, LEUKOCYTESUR ° ° °STUDIES: °MR BREAST BILATERAL W WO CONTRAST INC CAD ° °Result Date: 02/24/2020 °CLINICAL DATA:  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 Subsequent MRI guided biopsy performed on 11/01/2019 of non-mass enhancement within the upper-outer quadrant of the LEFT breast with benign pathology result felt to be discordant and to be considered malignant until proven otherwise. Status post chemotherapy.  Evaluate response. LABS:  Not performed at imaging center. EXAM: BILATERAL BREAST MRI WITH AND WITHOUT CONTRAST TECHNIQUE: Multiplanar, multisequence MR images of both breasts were obtained prior to and following the intravenous administration of 9 ml of Gadavist Three-dimensional MR images were rendered by post-processing of the original MR data  on an independent workstation. The three-dimensional MR images were interpreted, and findings are reported in the following complete MRI report for this study. Three dimensional images were evaluated at the independent interpreting workstation using the DynaCAD thin client. COMPARISON:  Breast MRI dated 10/21/2019 FINDINGS: Breast composition: b. Scattered fibroglandular tissue. Background parenchymal enhancement: Mild Right breast: 2 sites of new suspicious linear non-mass enhancement within the lower central and lower RIGHT breast, measuring 3.2 cm and 1.9 cm (series 9, image 105 through 107 and series 9, images 118 through 124 respectively). Left breast: 3 biopsy site markers are appreciated within the LEFT breast, 2 of which correspond to biopsy-proven invasive ductal carcinomas, as detailed in the clinical data above. Small residual non-mass enhancement within the upper central LEFT breast, measuring approximately 1.5 cm greatest dimension, corresponding to the third biopsy site (MRI-guided) yielding benign pathology result felt to be discordant and   to be considered malignant until proven otherwise, significantly improved in size/extent compared to the previous MRI. Additional small residual non-mass enhancement within the upper-outer quadrant of the LEFT breast, at the most posterior biopsy site marker, but significantly improved compared to the previous MRI. No residual enhancement at the biopsy site marker located most laterally within the LEFT breast. No new enhancing masses, suspicious non-mass enhancement or secondary signs of malignancy within the LEFT breast. Lymph nodes: No abnormal appearing lymph nodes. Ancillary findings:  None. IMPRESSION: 1. Two sites of new suspicious linear non-mass enhancement within the lower central and lower RIGHT breast, measuring 3.2 cm and 1.9 cm extent respectively, series and image numbers provided above. MRI-guided biopsies are recommended for each site. 2. Significant  interval decrease of the mass and non-mass enhancement within the LEFT breast, corresponding to the sites of known biopsy-proven carcinoma and the additional third biopsy site revealing a benign pathology result felt to be discordant and to be considered malignant until proven otherwise. This indicates a good response to interval therapy. RECOMMENDATION: 1. MRI-guided biopsies of the 2 sites of new suspicious linear non-mass enhancement within the lower central and lower RIGHT breast, measuring 3.2 cm and 1.9 cm extent respectively. 2. If breast conservation surgery is performed for the LEFT breast, recommend excision of all 3 biopsy sites (3 biopsy clips) corresponding to the recommendations provided on MRI biopsy report of 11/01/2019. BI-RADS CATEGORY  4: Suspicious. Electronically Signed   By: Franki Cabot M.D.   On: 02/24/2020 13:14   MM CLIP PLACEMENT RIGHT  Result Date: 02/27/2020 CLINICAL DATA:  Confirmation clip placement after MRI guided core needle biopsies of 2 foci of non-mass enhancement involving the LOWER RIGHT breast. EXAM: 2D AND TOMOSYNTHESIS DIAGNOSTIC RIGHT MAMMOGRAM POST MRI BIOPSY COMPARISON:  Previous exam(s). FINDINGS: Tomosynthesis and synthesized full field CC and mediolateral images were obtained following MRI guided biopsy of 2 areas of non-mass enhancement involving the LOWER RIGHT breast. The dumbbell-shaped tissue marking clip is appropriately positioned at the site of the biopsied non-mass enhancement in the LOWER breast at MIDDLE depth, slight LOWER OUTER QUADRANT. The cylinder shaped tissue marker clip is appropriately positioned at the site of the biopsied non-mass enhancement in the LOWER breast at ANTERIOR to MIDDLE depth, near 6 o'clock location. Expected post biopsy changes are present at both sites without evidence of hematoma. IMPRESSION: 1. Appropriate positioning of the dumbbell shaped tissue marking clip at the site of biopsy in the LOWER RIGHT breast at MIDDLE depth,  slight LOWER OUTER QUADRANT. 2. Appropriate positioning of the cylinder shaped tissue marker clip at the site of biopsy in the LOWER RIGHT breast at ANTERIOR to MIDDLE depth, near 6 o'clock location. Final Assessment: Post Procedure Mammograms for Marker Placement Electronically Signed   By: Evangeline Dakin M.D.   On: 02/27/2020 09:26   MR RT BREAST BX W LOC DEV 1ST LESION IMAGE BX SPEC MR GUIDE  Addendum Date: 03/02/2020   ADDENDUM REPORT: 02/28/2020 11:14 ADDENDUM: Pathology revealed LOW GRADE DUCTAL CARCINOMA IN SITU, INCIDENTAL PAPILLOMA WITH ATYPIA of the RIGHT breast, lower superior. This was found to be concordant by Dr. Peggye Fothergill. Pathology revealed LOW GRADE DUCTAL CARCINOMA IN SITU of the RIGHT breast, lower inferior. This was found to be concordant by Dr. Peggye Fothergill. Pathology results were discussed with the patient by telephone. The patient reported doing well after the biopsies with tenderness at the sites. Post biopsy instructions and care were reviewed and questions were answered. The patient was encouraged  to call [facility] for any additional concerns. My direct phone number was provided. The patient has a diagnosis of LEFT breast cancer and should follow her outlined treatment plan. Dawn Stuart, RN Oncology Navigator at [facility] was notified of results via EPIC message on February 28, 2020. The patient is scheduled to see Dr. Gus Magrinat at [facility] on March 03, 2020. Pathology results reported by Lynne Bailey, RN on 02/28/2020. Electronically Signed   By: Thomas  Lawrence M.D.   On: 02/28/2020 11:14  ° °Result Date: 03/02/2020 °CLINICAL DATA:  69-year-old with multifocal grade 3 triple negative invasive ductal carcinoma involving the UPPER OUTER QUADRANT of the LEFT breast with an excellent response to neoadjuvant chemotherapy. Post-treatment MRI demonstrated 2 new foci of linear non-mass enhancement in the LOWER RIGHT  breast. EXAM: MRI GUIDED CORE NEEDLE BIOPSY OF THE RIGHT BREAST x 2 TECHNIQUE: Multiplanar, multisequence MR imaging of the RIGHT breast was performed both before and after administration of intravenous contrast. CONTRAST:  8mL GADAVIST GADOBUTROL 1 MMOL/ML IV. COMPARISON:  Previous exams. FINDINGS: I met with the patient, and we discussed the procedure of MRI guided biopsy, including risks, benefits, and alternatives. Specifically, we discussed the risks of infection, bleeding, tissue injury, clip migration, and inadequate sampling. Informed, written consent was given. The usual time out protocol was performed immediately prior to the procedure. # 1) LOWER breast, SUPERIOR focus: LOWER OUTER QUADRANT. Using sterile technique with chlorhexidine as skin antisepsis, 1% lidocaine and 1% lidocaine with epinephrine as local anesthetic, using MRI guidance 9 gauge vacuum assisted device was used perform core needle biopsy the SUPERIOR focus of non-mass enhancement in the LOWER RIGHT breast using a lateral approach. At the conclusion of the procedure, a dumbbell shaped tissue marker clip was deployed into the biopsy cavity. # 2) LOWER breast, INFERIOR focus: Near 6 o'clock location. Using sterile technique with chlorhexidine as skin antisepsis, 1% lidocaine and 1% lidocaine with epinephrine as local anesthetic, using MRI guidance 9 gauge vacuum assisted device was used perform core needle biopsy the INFERIOR focus of non-mass enhancement in the LOWER RIGHT breast using a lateral approach. At the conclusion of the procedure, a cylinder shaped tissue marker clip was deployed into the biopsy cavity. Follow-up 2-view mammogram was performed and dictated separately. IMPRESSION: MRI guided biopsy of 2 foci of non-mass enhancement involving the LOWER RIGHT breast. No apparent complications. Electronically Signed: By: Thomas  Lawrence M.D. On: 02/27/2020 09:27  ° °MR RT BREAST BX W LOC DEV EA ADD LESION IMAGE BX SPEC MR  GUIDE ° °Addendum Date: 03/02/2020   °ADDENDUM REPORT: 02/28/2020 11:14 ADDENDUM: Pathology revealed LOW GRADE DUCTAL CARCINOMA IN SITU, INCIDENTAL PAPILLOMA WITH ATYPIA of the RIGHT breast, lower superior. This was found to be concordant by Dr. Tommy Lawrence. Pathology revealed LOW GRADE DUCTAL CARCINOMA IN SITU of the RIGHT breast, lower inferior. This was found to be concordant by Dr. Tommy Lawrence. Pathology results were discussed with the patient by telephone. The patient reported doing well after the biopsies with tenderness at the sites. Post biopsy instructions and care were reviewed and questions were answered. The patient was encouraged to call [facility] for any additional concerns. My direct phone number was provided. The patient has a diagnosis of LEFT breast cancer and should follow her outlined treatment plan. Dawn Stuart, RN Oncology Navigator at [facility] was notified of results via EPIC message on February 28, 2020. The   patient is scheduled to see Dr. Gus Magrinat at [facility] on March 03, 2020. Pathology results reported by Lynne Bailey, RN on 02/28/2020. Electronically Signed   By: Thomas  Lawrence M.D.   On: 02/28/2020 11:14  ° °Result Date: 03/02/2020 °CLINICAL DATA:  69-year-old with multifocal grade 3 triple negative invasive ductal carcinoma involving the UPPER OUTER QUADRANT of the LEFT breast with an excellent response to neoadjuvant chemotherapy. Post-treatment MRI demonstrated 2 new foci of linear non-mass enhancement in the LOWER RIGHT breast. EXAM: MRI GUIDED CORE NEEDLE BIOPSY OF THE RIGHT BREAST x 2 TECHNIQUE: Multiplanar, multisequence MR imaging of the RIGHT breast was performed both before and after administration of intravenous contrast. CONTRAST:  8mL GADAVIST GADOBUTROL 1 MMOL/ML IV. COMPARISON:  Previous exams. FINDINGS: I met with the patient, and we discussed the procedure of MRI guided biopsy, including risks,  benefits, and alternatives. Specifically, we discussed the risks of infection, bleeding, tissue injury, clip migration, and inadequate sampling. Informed, written consent was given. The usual time out protocol was performed immediately prior to the procedure. # 1) LOWER breast, SUPERIOR focus: LOWER OUTER QUADRANT. Using sterile technique with chlorhexidine as skin antisepsis, 1% lidocaine and 1% lidocaine with epinephrine as local anesthetic, using MRI guidance 9 gauge vacuum assisted device was used perform core needle biopsy the SUPERIOR focus of non-mass enhancement in the LOWER RIGHT breast using a lateral approach. At the conclusion of the procedure, a dumbbell shaped tissue marker clip was deployed into the biopsy cavity. # 2) LOWER breast, INFERIOR focus: Near 6 o'clock location. Using sterile technique with chlorhexidine as skin antisepsis, 1% lidocaine and 1% lidocaine with epinephrine as local anesthetic, using MRI guidance 9 gauge vacuum assisted device was used perform core needle biopsy the INFERIOR focus of non-mass enhancement in the LOWER RIGHT breast using a lateral approach. At the conclusion of the procedure, a cylinder shaped tissue marker clip was deployed into the biopsy cavity. Follow-up 2-view mammogram was performed and dictated separately. IMPRESSION: MRI guided biopsy of 2 foci of non-mass enhancement involving the LOWER RIGHT breast. No apparent complications. Electronically Signed: By: Thomas  Lawrence M.D. On: 02/27/2020 09:27  ° ° ° °ELIGIBLE FOR AVAILABLE RESEARCH PROTOCOL: AET ° °ASSESSMENT: 69 y.o. [place] woman status post left breast upper outer quadrant biopsy 09/27/2019 for a clinically T1-T2 N0, stage IA- IIA invasive ductal carcinoma, grade 3, triple negative, with an MIB-1 of 90%. ° °(1) genetics testing 10/30/2019 through the [facility] Common Hereditary Cancers Panel found no deleterious mutations in APC, ATM, AXIN2, BARD1, BMPR1A, BRCA1, BRCA2, BRIP1, CDH1, CDK4, CDKN2A  (p14ARF), CDKN2A (p16INK4a), CHEK2, CTNNA1, DICER1, EPCAM (Deletion/duplication testing only), GREM1 (promoter region deletion/duplication testing only), KIT, MEN1, MLH1, MSH2, MSH3, MSH6, MUTYH, NBN, NF1, NHTL1, PALB2, PDGFRA, PMS2, POLD1, POLE, PTEN, RAD50, RAD51C, RAD51D, RNF43, SDHB, SDHC, SDHD, SMAD4, SMARCA4. STK11, TP53, TSC1, TSC2, and VHL.  The following genes were evaluated for sequence changes only: SDHA and HOXB13 c.251G>A variant only. ° °(2) neoadjuvant chemotherapy consisting of doxorubicin and cyclophosphamide in dose dense fashion x4 starting 10/22/2019, completed 12/03/2019,followed by weekly carboplatin and paclitaxel x12 started 12/17/2019, last dose 02/25/2020 ° (a) echo 10/17/2019 shows an ejection fraction in the 60-65% range ° (b) final 2 doses of carboplatinum/paclitaxel omitted with grade 1 neuropathy ° °(3) multicentric biopsy x2 on the right (contralateral) breast 02/27/2020 shows ductal carcinoma in situ, prognostic panel pending ° °(4) definitive surgery to follow ° °(5) adjuvant radiation as appropriate ° ° °PLAN: °Huong received most of the   planned chemotherapy but we are omitting the final 2 doses of her weekly treatments because of developing neuropathy. I hope by simply doing this the neuropathy will resolve. She understands we do not have effective treatment if it persists. ° °She is not ready for surgery. We discussed the difference between lumpectomy and mastectomy extensively and she understands that there is generally no survival difference between these when lumpectomy is combined with radiation. The choice of mastectomy would depend of course on cosmesis issues, issues of symmetry, and psychological preference. She will be discussing all this at length with her surgeon. ° °She will have a virtual visit with me in late March simply to catch up on developments. Once she is done with surgery and radiation she will return to see me in person and we will set her up for long-term  follow-up at that time ° °Total encounter time 30 minutes.* ° °Gustav C. Magrinat, MD 03/03/2020 6:54 PM °Medical Oncology and Hematology °[facility] °[address] °[place], [place] [address] °Tel. [phone]    Fax. [phone] ° ° °This document serves as a record of services personally performed by Gustav Magrinat, MD. It was created on his behalf by Katie Daubenspeck, a trained medical scribe. The creation of this record is based on the scribe's personal observations and the provider's statements to them.  ° °I, Gustav Magrinat MD, have reviewed the above documentation for accuracy and completeness, and I agree with the above. ° ° °*Total Encounter Time as defined by the Centers for Medicare and Medicaid Services includes, in addition to the face-to-face time of a patient visit (documented in the note above) non-face-to-face time: obtaining and reviewing outside history, ordering and reviewing medications, tests or procedures, care coordination (communications with other health care professionals or caregivers) and documentation in the medical record. ° °

## 2020-03-03 ENCOUNTER — Other Ambulatory Visit: Payer: Medicare HMO

## 2020-03-03 ENCOUNTER — Other Ambulatory Visit: Payer: Self-pay

## 2020-03-03 ENCOUNTER — Inpatient Hospital Stay: Payer: Medicare HMO

## 2020-03-03 ENCOUNTER — Inpatient Hospital Stay: Payer: Medicare HMO | Admitting: Oncology

## 2020-03-03 ENCOUNTER — Telehealth: Payer: Self-pay | Admitting: Oncology

## 2020-03-03 VITALS — BP 128/66 | HR 115 | Temp 97.5°F | Resp 18 | Ht 63.5 in | Wt 189.1 lb

## 2020-03-03 DIAGNOSIS — Z79899 Other long term (current) drug therapy: Secondary | ICD-10-CM | POA: Diagnosis not present

## 2020-03-03 DIAGNOSIS — C50412 Malignant neoplasm of upper-outer quadrant of left female breast: Secondary | ICD-10-CM

## 2020-03-03 DIAGNOSIS — R011 Cardiac murmur, unspecified: Secondary | ICD-10-CM | POA: Diagnosis not present

## 2020-03-03 DIAGNOSIS — Z95828 Presence of other vascular implants and grafts: Secondary | ICD-10-CM

## 2020-03-03 DIAGNOSIS — Z5111 Encounter for antineoplastic chemotherapy: Secondary | ICD-10-CM | POA: Diagnosis not present

## 2020-03-03 DIAGNOSIS — Z171 Estrogen receptor negative status [ER-]: Secondary | ICD-10-CM

## 2020-03-03 DIAGNOSIS — R5383 Other fatigue: Secondary | ICD-10-CM | POA: Diagnosis not present

## 2020-03-03 DIAGNOSIS — Z5189 Encounter for other specified aftercare: Secondary | ICD-10-CM | POA: Diagnosis not present

## 2020-03-03 LAB — CBC WITH DIFFERENTIAL (CANCER CENTER ONLY)
Abs Immature Granulocytes: 0.02 10*3/uL (ref 0.00–0.07)
Basophils Absolute: 0 10*3/uL (ref 0.0–0.1)
Basophils Relative: 1 %
Eosinophils Absolute: 0 10*3/uL (ref 0.0–0.5)
Eosinophils Relative: 0 %
HCT: 29.4 % — ABNORMAL LOW (ref 36.0–46.0)
Hemoglobin: 10.5 g/dL — ABNORMAL LOW (ref 12.0–15.0)
Immature Granulocytes: 1 %
Lymphocytes Relative: 23 %
Lymphs Abs: 0.6 10*3/uL — ABNORMAL LOW (ref 0.7–4.0)
MCH: 34.8 pg — ABNORMAL HIGH (ref 26.0–34.0)
MCHC: 35.7 g/dL (ref 30.0–36.0)
MCV: 97.4 fL (ref 80.0–100.0)
Monocytes Absolute: 0.3 10*3/uL (ref 0.1–1.0)
Monocytes Relative: 11 %
Neutro Abs: 1.5 10*3/uL — ABNORMAL LOW (ref 1.7–7.7)
Neutrophils Relative %: 64 %
Platelet Count: 144 10*3/uL — ABNORMAL LOW (ref 150–400)
RBC: 3.02 MIL/uL — ABNORMAL LOW (ref 3.87–5.11)
RDW: 12.2 % (ref 11.5–15.5)
WBC Count: 2.4 10*3/uL — ABNORMAL LOW (ref 4.0–10.5)
nRBC: 0 % (ref 0.0–0.2)

## 2020-03-03 LAB — CMP (CANCER CENTER ONLY)
ALT: 11 U/L (ref 0–44)
AST: 13 U/L — ABNORMAL LOW (ref 15–41)
Albumin: 4 g/dL (ref 3.5–5.0)
Alkaline Phosphatase: 62 U/L (ref 38–126)
Anion gap: 9 (ref 5–15)
BUN: 14 mg/dL (ref 8–23)
CO2: 22 mmol/L (ref 22–32)
Calcium: 9 mg/dL (ref 8.9–10.3)
Chloride: 106 mmol/L (ref 98–111)
Creatinine: 1.05 mg/dL — ABNORMAL HIGH (ref 0.44–1.00)
GFR, Estimated: 57 mL/min — ABNORMAL LOW (ref >60)
Glucose, Bld: 101 mg/dL — ABNORMAL HIGH (ref 70–99)
Potassium: 3.9 mmol/L (ref 3.5–5.1)
Sodium: 137 mmol/L (ref 135–145)
Total Bilirubin: 0.4 mg/dL (ref 0.3–1.2)
Total Protein: 6.7 g/dL (ref 6.5–8.1)

## 2020-03-03 MED ORDER — PALONOSETRON HCL INJECTION 0.25 MG/5ML
INTRAVENOUS | Status: AC
  Filled 2020-03-03: qty 5

## 2020-03-03 MED ORDER — SODIUM CHLORIDE 0.9% FLUSH
10.0000 mL | Freq: Once | INTRAVENOUS | Status: AC
  Administered 2020-03-03: 10 mL
  Filled 2020-03-03: qty 10

## 2020-03-03 MED ORDER — HEPARIN SOD (PORK) LOCK FLUSH 100 UNIT/ML IV SOLN
500.0000 [IU] | Freq: Once | INTRAVENOUS | Status: AC
  Administered 2020-03-03: 500 [IU]
  Filled 2020-03-03: qty 5

## 2020-03-03 MED ORDER — FAMOTIDINE IN NACL 20-0.9 MG/50ML-% IV SOLN
INTRAVENOUS | Status: AC
  Filled 2020-03-03: qty 50

## 2020-03-03 MED ORDER — DIPHENHYDRAMINE HCL 50 MG/ML IJ SOLN
INTRAMUSCULAR | Status: AC
  Filled 2020-03-03: qty 1

## 2020-03-03 NOTE — Telephone Encounter (Signed)
Scheduled appts per 2/21 los. Gave pt a print out of AVS.

## 2020-03-06 ENCOUNTER — Encounter: Payer: Self-pay | Admitting: *Deleted

## 2020-03-09 ENCOUNTER — Encounter: Payer: Self-pay | Admitting: *Deleted

## 2020-03-23 ENCOUNTER — Other Ambulatory Visit: Payer: Self-pay | Admitting: Surgery

## 2020-03-23 DIAGNOSIS — D0511 Intraductal carcinoma in situ of right breast: Secondary | ICD-10-CM

## 2020-03-23 DIAGNOSIS — C50912 Malignant neoplasm of unspecified site of left female breast: Secondary | ICD-10-CM | POA: Diagnosis not present

## 2020-03-23 DIAGNOSIS — Z853 Personal history of malignant neoplasm of breast: Secondary | ICD-10-CM

## 2020-03-24 ENCOUNTER — Other Ambulatory Visit: Payer: Self-pay | Admitting: Surgery

## 2020-03-24 ENCOUNTER — Encounter: Payer: Self-pay | Admitting: *Deleted

## 2020-03-24 DIAGNOSIS — D0511 Intraductal carcinoma in situ of right breast: Secondary | ICD-10-CM

## 2020-03-24 DIAGNOSIS — Z853 Personal history of malignant neoplasm of breast: Secondary | ICD-10-CM

## 2020-03-24 DIAGNOSIS — C50412 Malignant neoplasm of upper-outer quadrant of left female breast: Secondary | ICD-10-CM

## 2020-03-25 DIAGNOSIS — G4733 Obstructive sleep apnea (adult) (pediatric): Secondary | ICD-10-CM | POA: Diagnosis not present

## 2020-03-25 DIAGNOSIS — E559 Vitamin D deficiency, unspecified: Secondary | ICD-10-CM | POA: Diagnosis not present

## 2020-03-25 DIAGNOSIS — E782 Mixed hyperlipidemia: Secondary | ICD-10-CM | POA: Diagnosis not present

## 2020-03-25 DIAGNOSIS — E669 Obesity, unspecified: Secondary | ICD-10-CM | POA: Diagnosis not present

## 2020-03-25 DIAGNOSIS — N182 Chronic kidney disease, stage 2 (mild): Secondary | ICD-10-CM | POA: Diagnosis not present

## 2020-03-25 DIAGNOSIS — R7303 Prediabetes: Secondary | ICD-10-CM | POA: Diagnosis not present

## 2020-03-25 DIAGNOSIS — D0510 Intraductal carcinoma in situ of unspecified breast: Secondary | ICD-10-CM | POA: Diagnosis not present

## 2020-03-25 DIAGNOSIS — M1711 Unilateral primary osteoarthritis, right knee: Secondary | ICD-10-CM | POA: Diagnosis not present

## 2020-03-26 ENCOUNTER — Other Ambulatory Visit: Payer: Self-pay | Admitting: Surgery

## 2020-03-26 DIAGNOSIS — Z853 Personal history of malignant neoplasm of breast: Secondary | ICD-10-CM

## 2020-03-27 ENCOUNTER — Other Ambulatory Visit: Payer: Self-pay | Admitting: Surgery

## 2020-03-27 DIAGNOSIS — Z853 Personal history of malignant neoplasm of breast: Secondary | ICD-10-CM

## 2020-04-05 NOTE — Progress Notes (Signed)
[facility]  [phone] [phone]     ID: Cheyenne Mcdonald DOB: [birth year]  MR#: [id number]  WPY#:[id number]  Patient Care Team: Trey Sailors, PA as PCP - General (Physician Assistant) Servando Salina, MD as Consulting Physician (Obstetrics and Gynecology) Mauro Kaufmann, RN as Oncology Nurse Navigator Rockwell Germany, RN as Oncology Nurse Navigator [name], MD as Consulting Physician (General Surgery) Magrinat, Virgie Dad, MD as Consulting Physician (Oncology) Gery Pray, MD as Consulting Physician (Radiation Oncology) Dimas Aguas, MD as Consulting Physician (Nephrology) Aurea Graff OTHER MD:  I connected with Leta Jungling on 04/05/20 at  2:30 PM EDT by video enabled telemedicine visit and verified that I am speaking with the correct person using two identifiers.   I discussed the limitations, risks, security and privacy concerns of performing an evaluation and management service by telemedicine and the availability of in-person appointments. I also discussed with the patient that there may be a patient responsible charge related to this service. The patient expressed understanding and agreed to proceed.   Other persons participating in the visit and their role in the encounter: None  Patient's location: Home Provider's location: [place]  Total time spent: 15 min   CHIEF COMPLAINT: triple negative breast cancer  CURRENT TREATMENT: Definitive surgery pending   INTERVAL HISTORY: [name] was contacted today for follow up of her triple negative breast cancer.    She is scheduled for right lumpectomy on 04/16/2020 under Dr. Ninfa Linden.   REVIEW OF SYSTEMS: Azalie met with Dr. Ninfa Linden and after much discussion decided that she is going to have lumpectomies.  She understands the risk of a possible positive margin and if she does have one and needs more surgery then at that time she is thinking  mastectomy.  I think this is a very reasonable plan.  Otherwise she is walking 3 miles or 4 miles most days.  She has gained about 15 pounds and we did discuss some diet issues today   COVID 19 VACCINATION STATUS: Status post [place] x2 with booster October 2021   HISTORY OF CURRENT ILLNESS: From the original intake note:  Cheyenne Mcdonald presented for her routine mammography with a palpable left breast lump. She underwent bilateral diagnostic mammography with tomography and left breast ultrasonography at [facility] on 09/13/2019 showing: breast density category B; two adjacent masses in left breast at 1 o'clock, spanning approximately 3.3 cm; one indeterminate left axillary lymph node with 0.4 cm cortical bulge; no evidence right breast malignancy.  Accordingly on 09/27/2019 she proceeded to biopsy of the left breast areas in question. The pathology from this procedure ([id number]) showed: invasive ductal carcinoma, grade 3, present in both of the adjacent masses. Prognostic indicators significant for: estrogen receptor, 0% negative and progesterone receptor, 0% negative. Proliferation marker Ki67 at 90%. HER2 equivocal by immunohistochemistry (2+), but negative by fluorescent in situ hybridization with a signals ratio 2.2 and number per cell 3.3. Additional analysis of more cells showed a signals ratio 2.05 and number per cell 3.13.  The biopsied lymph node was negative for carcinoma.  This was felt to be concordant  The patient's subsequent history is as detailed below.   PAST MEDICAL HISTORY: Past Medical History:  Diagnosis Date  . Breast cancer ([place])   . Obesity   . Sleep apnea   heart murmur (since childhood)   PAST SURGICAL HISTORY: Past Surgical History:  Procedure Laterality Date  . ABDOMINAL HYSTERECTOMY    .  IR IMAGING GUIDED PORT INSERTION  10/18/2019    FAMILY HISTORY: Family History  Problem Relation Age of Onset  . Breast cancer Cousin 96       maternal cousin;  bilateral    Her father died at age 12, cause of death unknown. Her mother is age 54 as of 09/2019. [name] has two brothers (and no sisters). She reports one cousin with breast cancer at age 27.   GYNECOLOGIC HISTORY:  No LMP recorded. Patient has had a hysterectomy. Menarche: 70 years old Age at first live birth: 70 years old GX P 2 LMP 1990 Contraceptive: used for maybe 20 years HRT: never used  Hysterectomy? Yes, 1990 BSO? 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 Husband Onalee Hua is retired Hotel manager and then retired Therapist, occupational.  At home is just the 2 of them. Daughter Marcelino Duster, age 35, works in data entry in [employer]. Son Loraine Leriche, age 71, has a college degree but works for [employer] in [employer]. Baneen has four grandchildren. She attends [place] [address]    ADVANCED DIRECTIVES: In place   HEALTH MAINTENANCE: Social History   Tobacco Use  . Smoking status: Never Smoker  . Smokeless tobacco: Never Used  Substance Use Topics  . Alcohol use: Never  . Drug use: Never     Colonoscopy: 2018 (Dr. Loreta Ave)  PAP: approx. 2013  Bone density: 2016, "normal"   Allergies  Allergen Reactions  . Other Itching  . Shellfish Allergy Itching    Current Outpatient Medications  Medication Sig Dispense Refill  . Cholecalciferol 125 MCG (5000 UT) capsule Take by mouth.    . loratadine (CLARITIN) 10 MG tablet Take 1 tablet (10 mg total) by mouth daily. (Patient not taking: Reported on 02/04/2020) 40 tablet 0   No current facility-administered medications for this visit.    OBJECTIVE: Afr  There were no vitals filed for this visit.   There is no height or weight on file to calculate BMI.   Wt Readings from Last 3 Encounters:  03/03/20 189 lb 1.6 oz (85.8 kg)  02/25/20 191 lb 12.8 oz (87 kg)  02/18/20 197 lb 14.4 oz (89.8 kg)      ECOG FS:1 - Symptomatic but completely ambulatory  Telemedicine visit  04/06/2020    LAB RESULTS:  CMP     Component Value Date/Time   NA 137 03/03/2020 0839   K 3.9 03/03/2020 0839   CL 106 03/03/2020 0839   CO2 22 03/03/2020 0839   GLUCOSE 101 (H) 03/03/2020 0839   BUN 14 03/03/2020 0839   CREATININE 1.05 (H) 03/03/2020 0839   CALCIUM 9.0 03/03/2020 0839   PROT 6.7 03/03/2020 0839   ALBUMIN 4.0 03/03/2020 0839   AST 13 (L) 03/03/2020 0839   ALT 11 03/03/2020 0839   ALKPHOS 62 03/03/2020 0839   BILITOT 0.4 03/03/2020 0839   GFRNONAA 57 (L) 03/03/2020 0839   GFRAA 58 (L) 10/09/2019 0806    No results found for: TOTALPROTELP, ALBUMINELP, A1GS, A2GS, BETS, BETA2SER, GAMS, MSPIKE, SPEI  Lab Results  Component Value Date   WBC 2.4 (L) 03/03/2020   NEUTROABS 1.5 (L) 03/03/2020   HGB 10.5 (L) 03/03/2020   HCT 29.4 (L) 03/03/2020   MCV 97.4 03/03/2020   PLT 144 (L) 03/03/2020    No results found for: LABCA2  No components found for: WPQLJH107  No results for input(s): INR in the last 168 hours.  No  results found for: LABCA2  No results found for: TDV761  No results found for: YWV371  No results found for: GGY694  No results found for: CA2729  No components found for: HGQUANT  No results found for: CEA1 / No results found for: CEA1   No results found for: AFPTUMOR  No results found for: CHROMOGRNA  No results found for: KPAFRELGTCHN, LAMBDASER, KAPLAMBRATIO (kappa/lambda light chains)  No results found for: HGBA, HGBA2QUANT, HGBFQUANT, HGBSQUAN (Hemoglobinopathy evaluation)   No results found for: LDH  No results found for: IRON, TIBC, IRONPCTSAT (Iron and TIBC)  No results found for: FERRITIN  Urinalysis No results found for: COLORURINE, APPEARANCEUR, LABSPEC, PHURINE, GLUCOSEU, HGBUR, BILIRUBINUR, KETONESUR, PROTEINUR, UROBILINOGEN, NITRITE, LEUKOCYTESUR   STUDIES: No results found.   ELIGIBLE FOR AVAILABLE RESEARCH PROTOCOL: AET  ASSESSMENT: 70 y.o. [place] woman status post left breast upper outer  quadrant biopsy 09/27/2019 for a clinically T1-T2 N0, stage IA- IIA invasive ductal carcinoma, grade 3, triple negative, with an MIB-1 of 90%.  (1) genetics testing 10/30/2019 through the [facility] Common Hereditary Cancers Panel found no deleterious mutations in APC, ATM, AXIN2, BARD1, BMPR1A, BRCA1, BRCA2, BRIP1, CDH1, CDK4, CDKN2A (p14ARF), CDKN2A (p16INK4a), CHEK2, CTNNA1, DICER1, EPCAM (Deletion/duplication testing only), GREM1 (promoter region deletion/duplication testing only), KIT, MEN1, MLH1, MSH2, MSH3, MSH6, MUTYH, NBN, NF1, NHTL1, PALB2, PDGFRA, PMS2, POLD1, POLE, PTEN, RAD50, RAD51C, RAD51D, RNF43, SDHB, SDHC, SDHD, SMAD4, SMARCA4. STK11, TP53, TSC1, TSC2, and VHL.  The following genes were evaluated for sequence changes only: SDHA and HOXB13 c.251G>A variant only.  (2) neoadjuvant chemotherapy consisting of doxorubicin and cyclophosphamide in dose dense fashion x4 starting 10/22/2019, completed 12/03/2019,followed by weekly carboplatin and paclitaxel x12 started 12/17/2019, last dose 02/25/2020  (a) echo 10/17/2019 shows an ejection fraction in the 60-65% range  (b) final 2 doses of carboplatinum/paclitaxel omitted with grade 1 neuropathy  (3) multicentric biopsy x2 on the right (contralateral) breast 02/27/2020 shows ductal carcinoma in situ, estrogen and progesterone receptor strongly positive  (4) definitive surgery pending (5) adjuvant radiation as appropriate   PLAN: Mitzie did very well with her chemotherapy, with no permanent side effects that I can detect.  Her functional status continues to improve and I commended her exercise program.  She understands and we reviewed again that whether she has lumpectomies or mastectomy does not affect survival.  Her plan I think is very reasonable namely to have a lumpectomy and assuming margins are clear then does proceed to radiation.  If margins are not clear she would consider mastectomy just to "be done with it".  I am going to have a  virtual visit with her about a week after her surgery to review those results  She knows to call for any other issue that may develop before then    Sarajane Jews C. Magrinat, MD 04/05/2020 9:12 AM Medical Oncology and Hematology [facility] [place], [place] [address] Tel. [phone]    Fax. [phone]   This document serves as a record of services personally performed by Lurline Del, MD. It was created on his behalf by Wilburn Mylar, a trained medical scribe. The creation of this record is based on the scribe's personal observations and the provider's statements to them.   I, Lurline Del MD, have reviewed the above documentation for accuracy and completeness, and I agree with the above.   *Total Encounter Time as defined by the Centers for Medicare and Medicaid Services includes, in addition to the face-to-face time of a patient  visit (documented in the note above) non-face-to-face time: obtaining and reviewing outside history, ordering and reviewing medications, tests or procedures, care coordination (communications with other health care professionals or caregivers) and documentation in the medical record.

## 2020-04-06 ENCOUNTER — Encounter: Payer: Self-pay | Admitting: Oncology

## 2020-04-06 ENCOUNTER — Inpatient Hospital Stay: Payer: Medicare HMO | Attending: Oncology | Admitting: Oncology

## 2020-04-06 DIAGNOSIS — Z171 Estrogen receptor negative status [ER-]: Secondary | ICD-10-CM

## 2020-04-06 DIAGNOSIS — C50412 Malignant neoplasm of upper-outer quadrant of left female breast: Secondary | ICD-10-CM

## 2020-04-06 NOTE — Progress Notes (Signed)
Received call from patient regarding [employer]. Provided verbal income guidelines and she states they will not qualify.  Asked if she would like to be referred to social worker to see if there are any additional resources or programs available and she states no they would be fine.  She has my card for any additional financial questions or concerns.

## 2020-04-08 ENCOUNTER — Other Ambulatory Visit: Payer: Self-pay

## 2020-04-08 ENCOUNTER — Encounter (HOSPITAL_BASED_OUTPATIENT_CLINIC_OR_DEPARTMENT_OTHER): Payer: Self-pay | Admitting: Surgery

## 2020-04-09 ENCOUNTER — Other Ambulatory Visit: Payer: Self-pay | Admitting: Oncology

## 2020-04-10 ENCOUNTER — Telehealth: Payer: Self-pay

## 2020-04-10 NOTE — Telephone Encounter (Signed)
Dr Ricard Dillon called to inform Cheyenne Mcdonald has abscesses and will need surgery r/s. Number provided to Dr Trevor Mace office.

## 2020-04-13 ENCOUNTER — Other Ambulatory Visit (HOSPITAL_COMMUNITY)
Admission: RE | Admit: 2020-04-13 | Discharge: 2020-04-13 | Disposition: A | Discharge status: Home / Self Care | Payer: Medicare HMO | Source: Ambulatory Visit | Attending: Surgery | Admitting: Surgery

## 2020-04-13 DIAGNOSIS — Z01812 Encounter for preprocedural laboratory examination: Secondary | ICD-10-CM | POA: Insufficient documentation

## 2020-04-13 DIAGNOSIS — Z20822 Contact with and (suspected) exposure to covid-19: Secondary | ICD-10-CM | POA: Diagnosis not present

## 2020-04-13 LAB — SARS CORONAVIRUS 2 (TAT 6-24 HRS): SARS Coronavirus 2: NEGATIVE

## 2020-04-15 ENCOUNTER — Ambulatory Visit
Admission: RE | Admit: 2020-04-15 | Discharge: 2020-04-15 | Disposition: A | Discharge status: Home / Self Care | Payer: Medicare HMO | Source: Ambulatory Visit | Attending: Surgery | Admitting: Surgery

## 2020-04-15 ENCOUNTER — Other Ambulatory Visit: Payer: Self-pay

## 2020-04-15 DIAGNOSIS — R928 Other abnormal and inconclusive findings on diagnostic imaging of breast: Secondary | ICD-10-CM | POA: Diagnosis not present

## 2020-04-15 DIAGNOSIS — D0511 Intraductal carcinoma in situ of right breast: Secondary | ICD-10-CM

## 2020-04-15 DIAGNOSIS — Z853 Personal history of malignant neoplasm of breast: Secondary | ICD-10-CM

## 2020-04-15 DIAGNOSIS — C50912 Malignant neoplasm of unspecified site of left female breast: Secondary | ICD-10-CM | POA: Diagnosis not present

## 2020-04-15 DIAGNOSIS — C50911 Malignant neoplasm of unspecified site of right female breast: Secondary | ICD-10-CM | POA: Diagnosis not present

## 2020-04-15 IMAGING — MG MM PLC BREAST LOC DEV 1ST LESION INC MAMMO GUIDE*L*
7 of 10 series · 7 of 10 positions shown · non-contrast
Comparison: Previous exam(s).

CLINICAL DATA: Patient with bilateral breast cancers (4 sites) and
1 additional benign biopsy site deemed discordant by the radiologist
performed the biopsy. Patient is scheduled tomorrow for bilateral
lumpectomies to include all 5 sites. Patient presents today for
preoperative radioactive seed localization for all 5 sites.

EXAM:
MAMMOGRAPHIC GUIDED RADIOACTIVE SEED LOCALIZATION OF THE BILATERAL
BREAST x5

[L CC (1 of 4)]
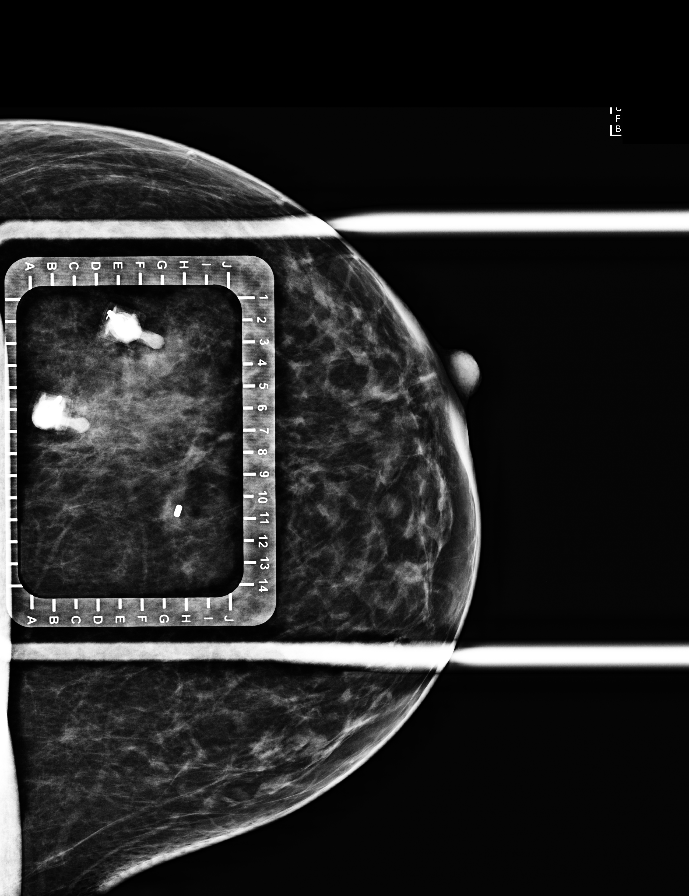

[L CC (2 of 4)]
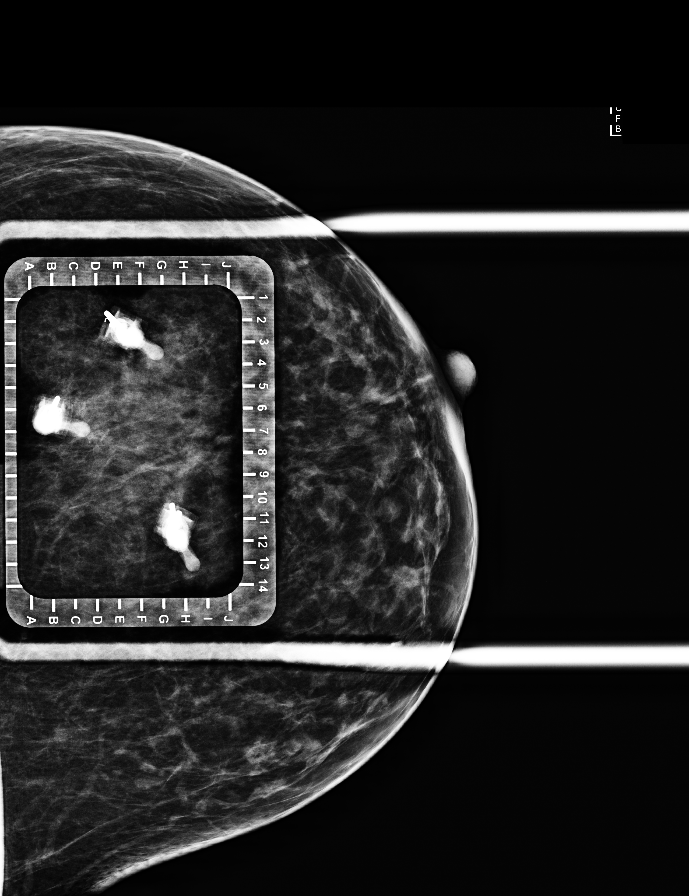

[L CC (3 of 4)]
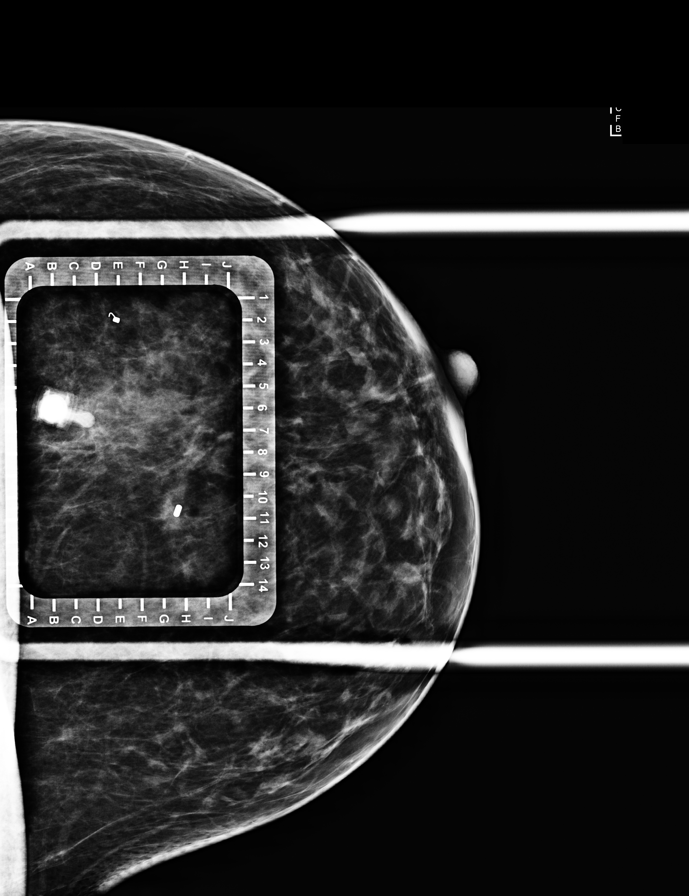

[L ML (1 of 3)]
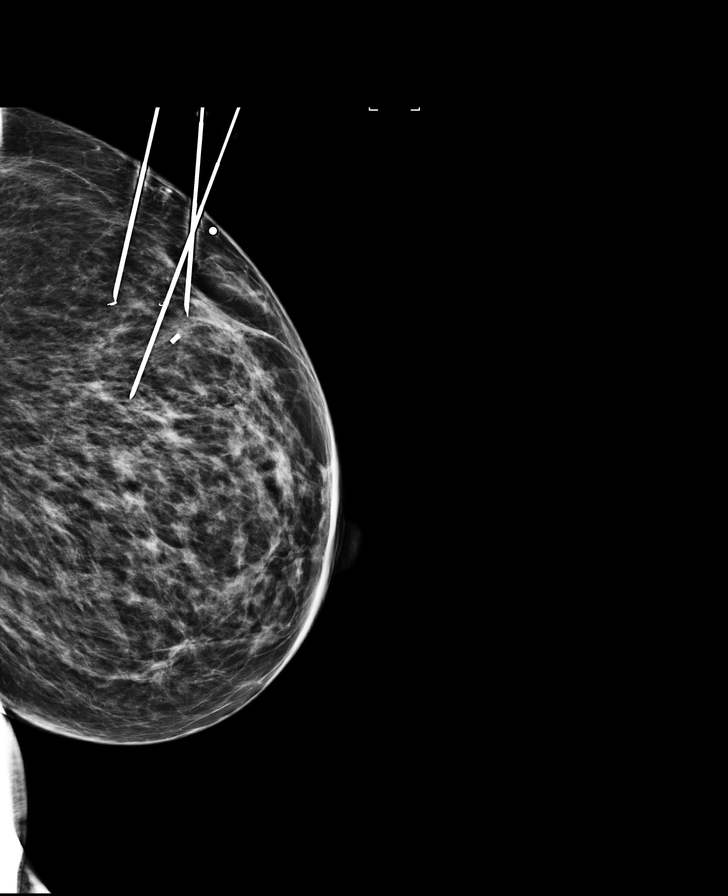

[L CC (4 of 4)]
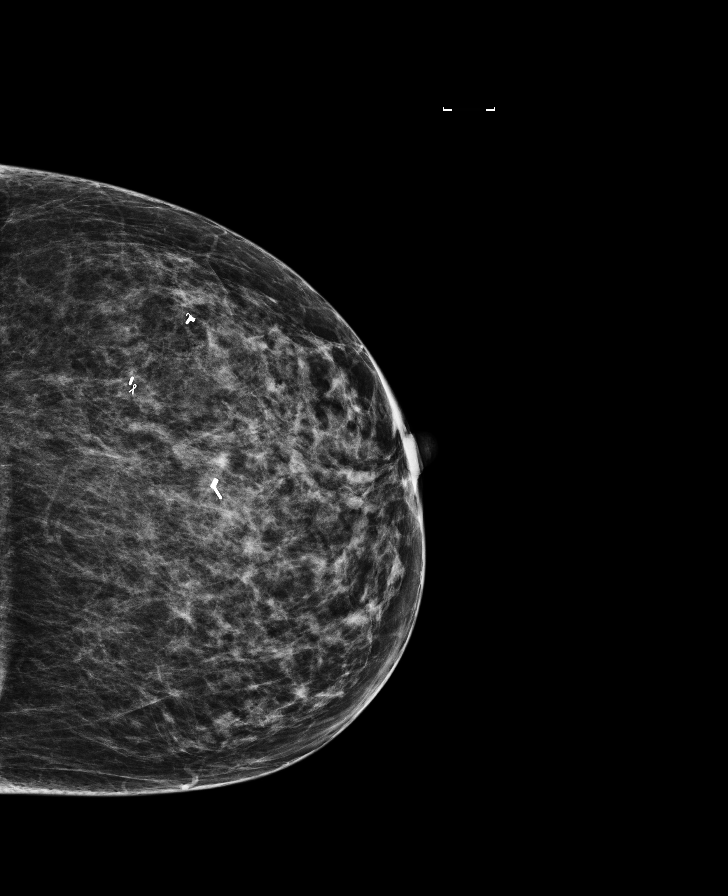

[L ML (2 of 3)]
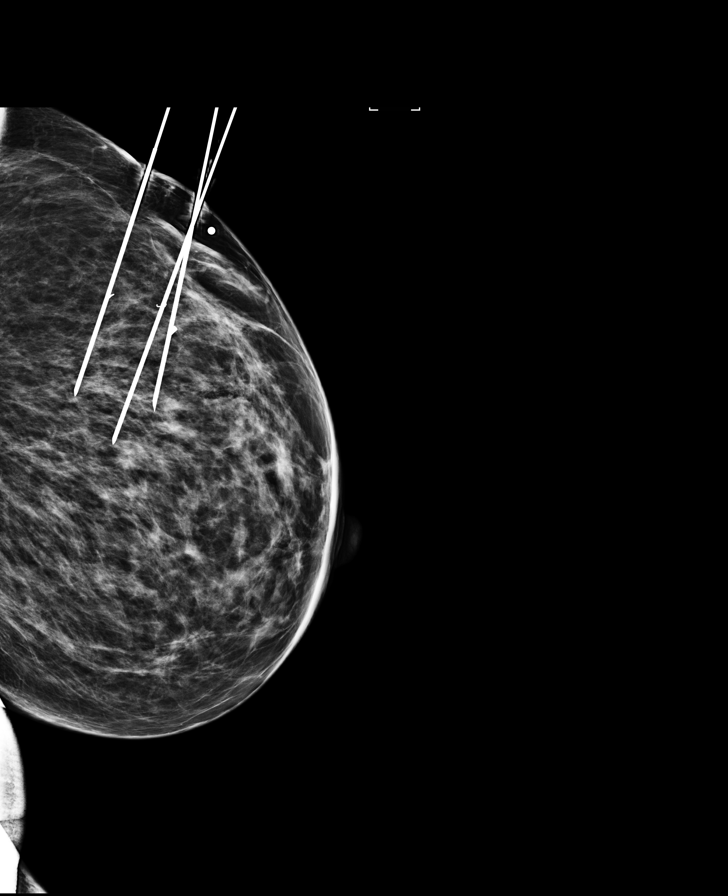

[L ML (3 of 3)]
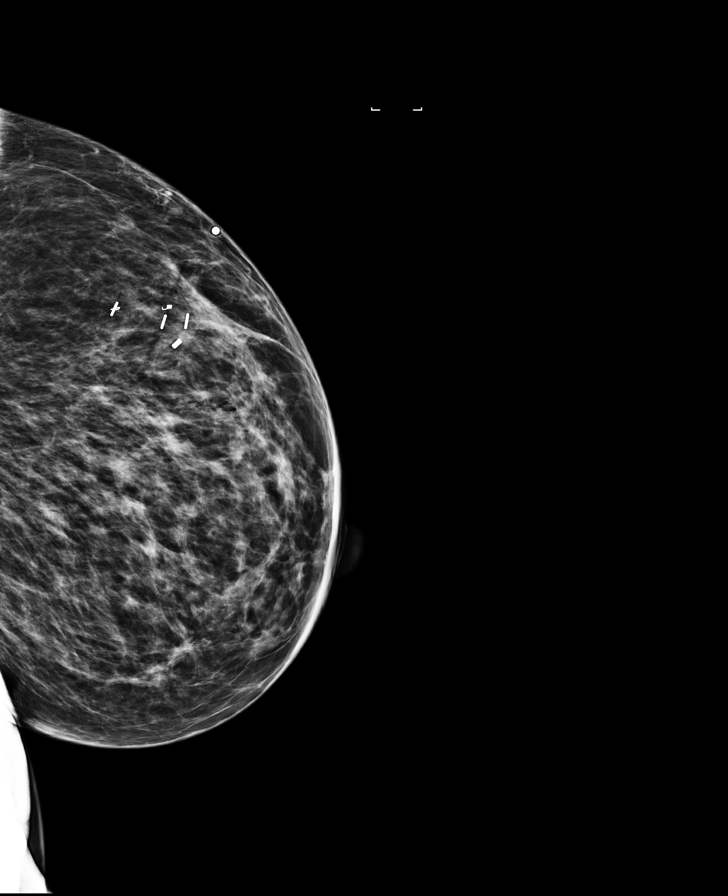

[7 of 10 positions shown; findings below may reference images not displayed]

FINDINGS: Patient presents for radioactive seed localization prior to
bilateral lumpectomies (5 biopsy sites). I met with the patient and
we discussed the procedure of seed localization including benefits
and alternatives. We discussed the high likelihood of a successful
procedure. We discussed the risks of the procedure including
infection, bleeding, tissue injury and further surgery. We discussed
the low dose of radioactivity involved in the procedure. Informed,
written consent was given.

The usual time-out protocol was performed immediately prior to the
procedure.

Site 1:

Using mammographic guidance, sterile technique, 1% lidocaine and an
[M6] radioactive seed, cylinder shaped clip within the outer RIGHT
breast was localized using a lateral approach. The follow-up
mammogram images confirm the seed in the expected location and were
marked for Dr. TYRA.

Follow-up survey of the patient confirms presence of the radioactive
seed.

Order number of [M6] seed:  [PHONE_NUMBER].

Total activity:  0.245 millicurie reference Date: [DATE]

Site 2:

Using mammographic guidance, sterile technique, 1% lidocaine and an
[M6] radioactive seed, the barbell shaped clip within the outer
RIGHT breast was localized using a lateral approach. The follow-up
mammogram images confirm the seed in the expected location and were
marked for Dr. TYRA.

Follow-up survey of the patient confirms presence of the radioactive
seed.

Order number of [M6] seed:  [PHONE_NUMBER].

Total activity:  0.250 millicuries reference Date: [DATE]

Site 3:

Using mammographic guidance, sterile technique, 1% lidocaine and an
[M6] radioactive seed, the cylinder shaped clip within the upper
LEFT breast was localized using a superior approach. The follow-up
mammogram images confirm the seed in the expected location and were
marked for Dr. TYRA.

Follow-up survey of the patient confirms presence of the radioactive
seed.

Order number of [M6] seed:  [PHONE_NUMBER].

Total activity:  0.253 millicuries reference Date: [DATE]

Site 4:

Using mammographic guidance, sterile technique, 1% lidocaine and an
[M6] radioactive seed, the ribbon shaped clip within the upper
outer quadrant of the LEFT breast was localized using a superior
approach. The follow-up mammogram images confirm the seed in the
expected location and were marked for Dr. TYRA.

Follow-up survey of the patient confirms presence of the radioactive
seed.

Order number of [M6] seed:  [PHONE_NUMBER].

Total activity:  0.250 millicuries reference Date: [DATE]

Site 5:

Using mammographic guidance, sterile technique, 1% lidocaine and an
[M6] radioactive seed, the coil shaped clip within the upper-outer
quadrant of the LEFT breast was localized using a superior approach.
The follow-up mammogram images confirm the seed in the expected
location and were marked for Dr. TYRA.

Follow-up survey of the patient confirms presence of the radioactive
seed.

Order number of [M6] seed:  [PHONE_NUMBER].

Total activity:  0.250 millicuries reference Date: [DATE]

The patient tolerated the procedure well and was released from the
[REDACTED]. She was given instructions regarding seed removal.
IMPRESSION: 1. Radioactive seed localization RIGHT breast. No apparent
complications.
2. Radioactive seed localization RIGHT breast. No apparent
complications.
3. Radioactive seed localization LEFT breast. No apparent
complications.
4. Radioactive seed localization LEFT breast. No apparent
complications.
5. Radioactive seed localization LEFT breast. No apparent
complications.

## 2020-04-15 IMAGING — MG MM PLC BREAST LOC DEV 1ST LESION INC*R*
7 of 11 series · 7 of 11 positions shown · non-contrast
Comparison: Previous exam(s).

CLINICAL DATA: Patient with bilateral breast cancers (4 sites) and
1 additional benign biopsy site deemed discordant by the radiologist
performed the biopsy. Patient is scheduled tomorrow for bilateral
lumpectomies to include all 5 sites. Patient presents today for
preoperative radioactive seed localization for all 5 sites.

EXAM:
MAMMOGRAPHIC GUIDED RADIOACTIVE SEED LOCALIZATION OF THE BILATERAL
BREAST x5

[R LM (1 of 3)]
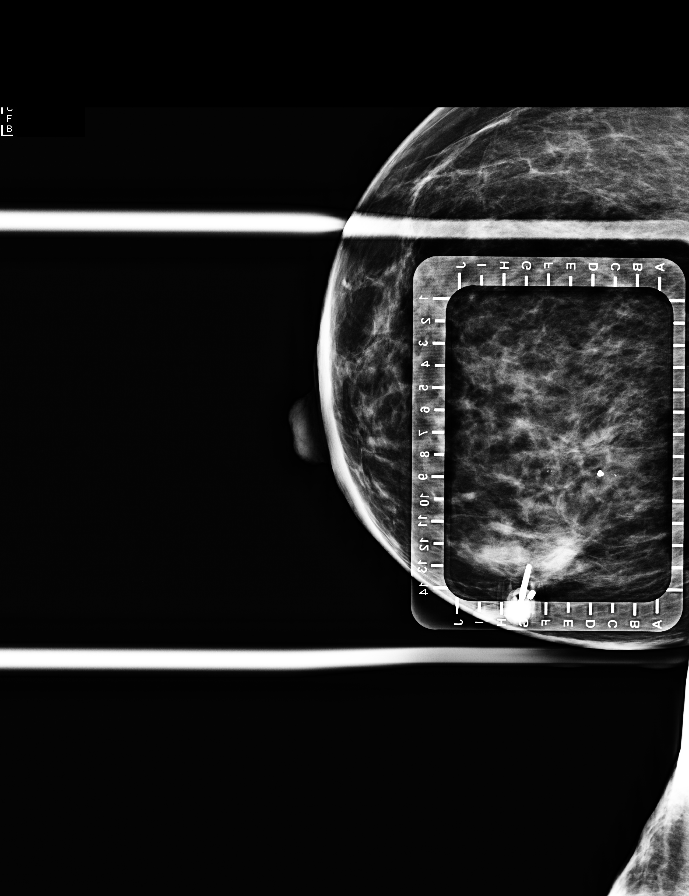

[R ML]
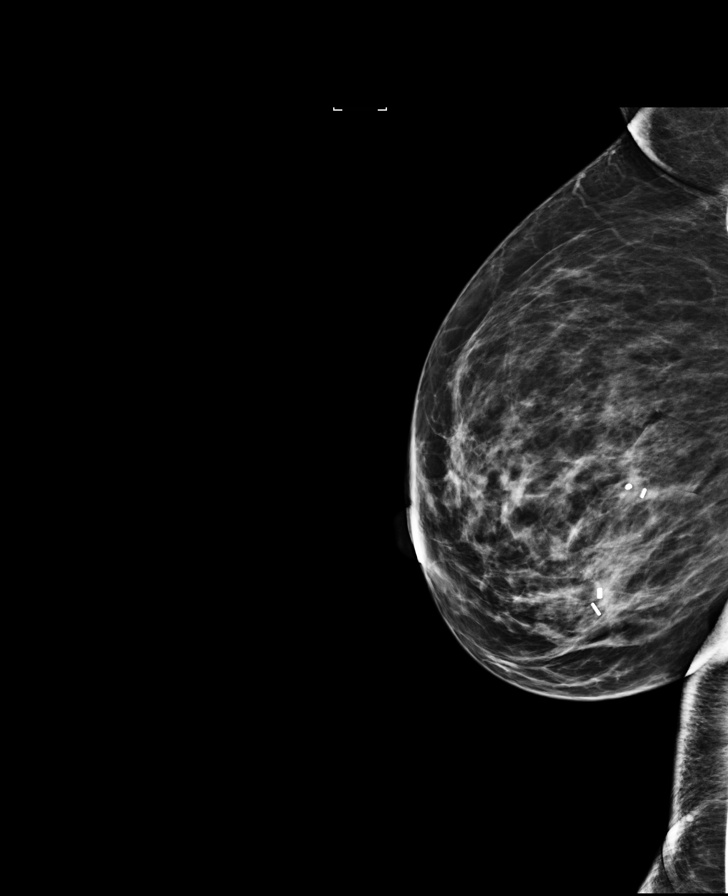

[R LM (2 of 3)]
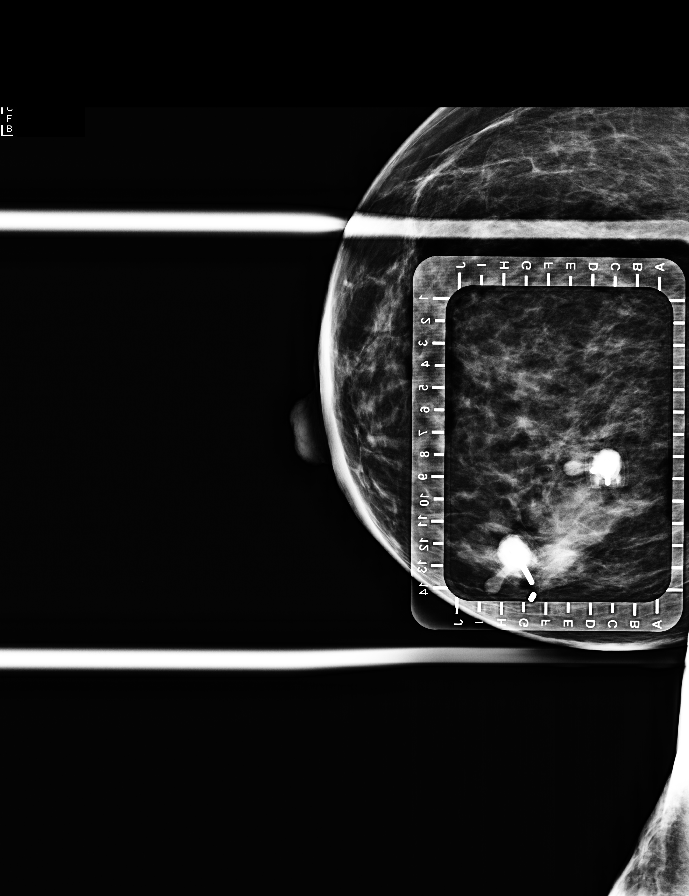

[R CC (1 of 3)]
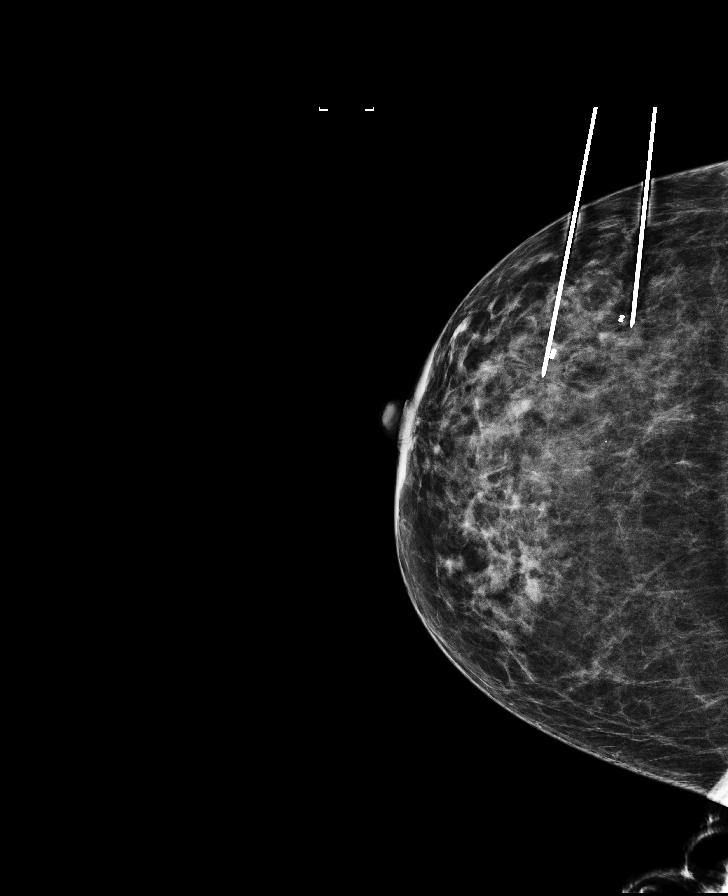

[R CC (2 of 3)]
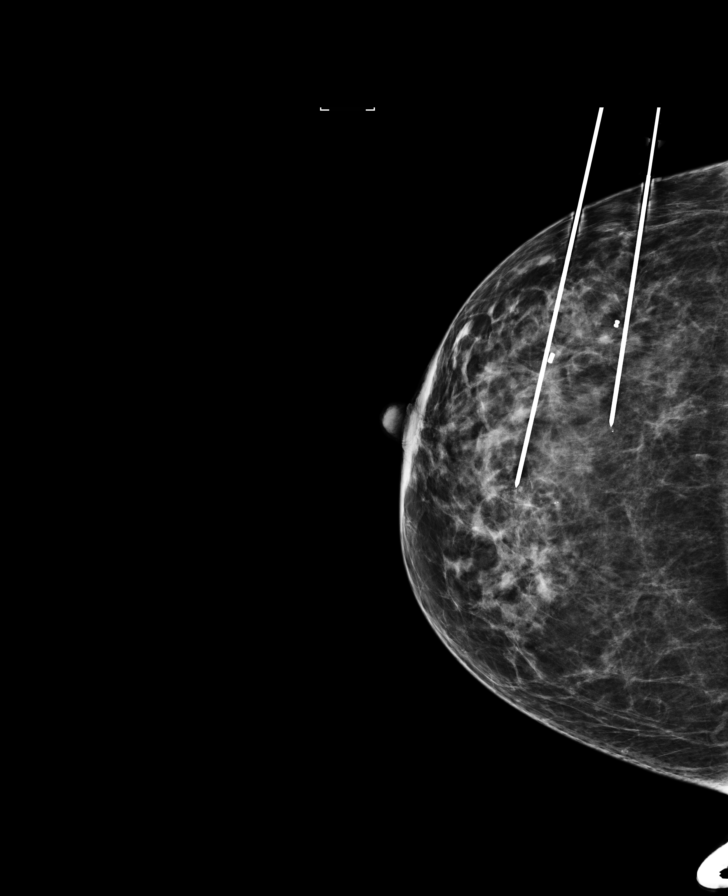

[R CC (3 of 3)]
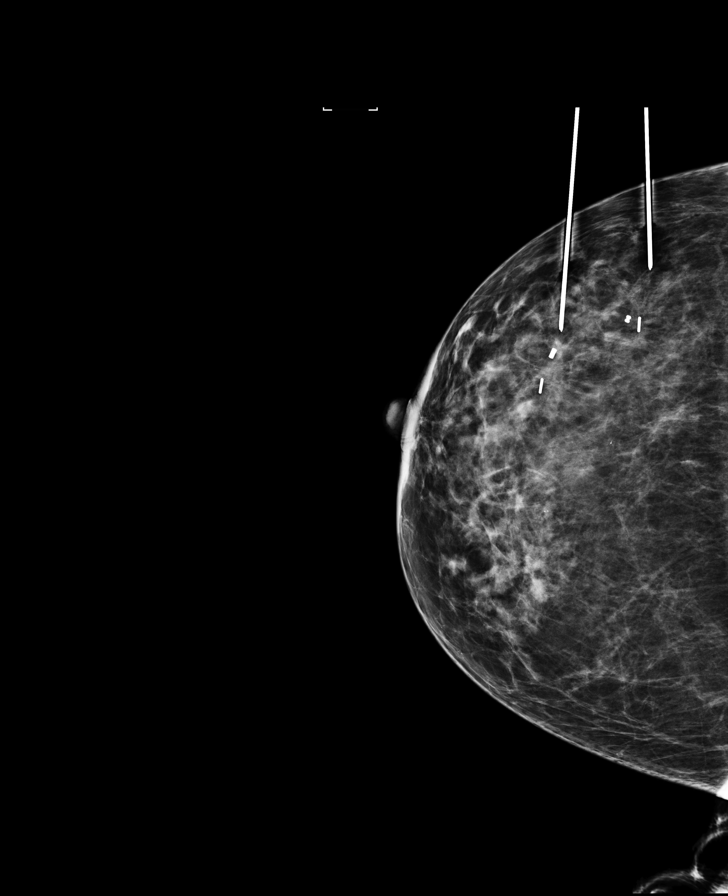

[R LM (3 of 3)]
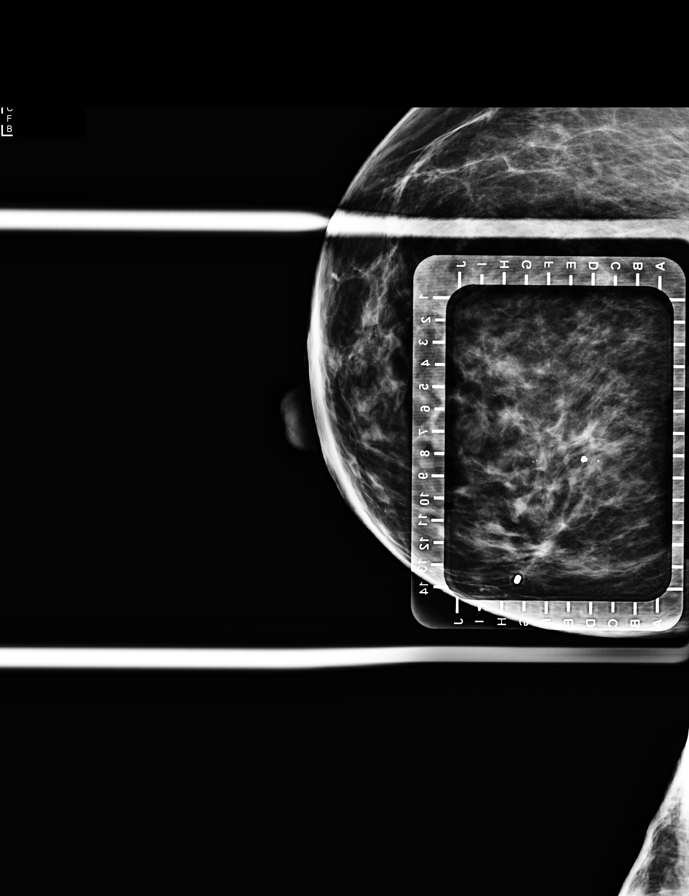

[7 of 11 positions shown; findings below may reference images not displayed]

FINDINGS: Patient presents for radioactive seed localization prior to
bilateral lumpectomies (5 biopsy sites). I met with the patient and
we discussed the procedure of seed localization including benefits
and alternatives. We discussed the high likelihood of a successful
procedure. We discussed the risks of the procedure including
infection, bleeding, tissue injury and further surgery. We discussed
the low dose of radioactivity involved in the procedure. Informed,
written consent was given.

The usual time-out protocol was performed immediately prior to the
procedure.

Site 1:

Using mammographic guidance, sterile technique, 1% lidocaine and an
[M6] radioactive seed, cylinder shaped clip within the outer RIGHT
breast was localized using a lateral approach. The follow-up
mammogram images confirm the seed in the expected location and were
marked for Dr. TYRA.

Follow-up survey of the patient confirms presence of the radioactive
seed.

Order number of [M6] seed:  [PHONE_NUMBER].

Total activity:  0.245 millicurie reference Date: [DATE]

Site 2:

Using mammographic guidance, sterile technique, 1% lidocaine and an
[M6] radioactive seed, the barbell shaped clip within the outer
RIGHT breast was localized using a lateral approach. The follow-up
mammogram images confirm the seed in the expected location and were
marked for Dr. TYRA.

Follow-up survey of the patient confirms presence of the radioactive
seed.

Order number of [M6] seed:  [PHONE_NUMBER].

Total activity:  0.250 millicuries reference Date: [DATE]

Site 3:

Using mammographic guidance, sterile technique, 1% lidocaine and an
[M6] radioactive seed, the cylinder shaped clip within the upper
LEFT breast was localized using a superior approach. The follow-up
mammogram images confirm the seed in the expected location and were
marked for Dr. TYRA.

Follow-up survey of the patient confirms presence of the radioactive
seed.

Order number of [M6] seed:  [PHONE_NUMBER].

Total activity:  0.253 millicuries reference Date: [DATE]

Site 4:

Using mammographic guidance, sterile technique, 1% lidocaine and an
[M6] radioactive seed, the ribbon shaped clip within the upper
outer quadrant of the LEFT breast was localized using a superior
approach. The follow-up mammogram images confirm the seed in the
expected location and were marked for Dr. TYRA.

Follow-up survey of the patient confirms presence of the radioactive
seed.

Order number of [M6] seed:  [PHONE_NUMBER].

Total activity:  0.250 millicuries reference Date: [DATE]

Site 5:

Using mammographic guidance, sterile technique, 1% lidocaine and an
[M6] radioactive seed, the coil shaped clip within the upper-outer
quadrant of the LEFT breast was localized using a superior approach.
The follow-up mammogram images confirm the seed in the expected
location and were marked for Dr. TYRA.

Follow-up survey of the patient confirms presence of the radioactive
seed.

Order number of [M6] seed:  [PHONE_NUMBER].

Total activity:  0.250 millicuries reference Date: [DATE]

The patient tolerated the procedure well and was released from the
[REDACTED]. She was given instructions regarding seed removal.
IMPRESSION: 1. Radioactive seed localization RIGHT breast. No apparent
complications.
2. Radioactive seed localization RIGHT breast. No apparent
complications.
3. Radioactive seed localization LEFT breast. No apparent
complications.
4. Radioactive seed localization LEFT breast. No apparent
complications.
5. Radioactive seed localization LEFT breast. No apparent
complications.

## 2020-04-15 NOTE — Progress Notes (Signed)

## 2020-04-15 NOTE — H&P (Signed)
Cheyenne Mcdonald  Location: [facility] Patient #: [id number] DOB: [birth year] Undefined / Language: Cleophus Molt / Race: Black or African American Female   History of Present Illness   The patient is a 70 year old female who presents with breast cancer. Chief complaint: Left breast cancer  This is a 70 year old female who presents with a left breast mass. She first felt this in August. She said it was quite large. She has no previous history of breast cancer. She underwent a mammogram and ultrasound showing 2 separate masses with a total area measuring 3.3 cm. There was one suspicious node in the axilla. 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 The lymph node that was biopsied was negative. She is otherwise healthy with no cardiopulmonary issues. There is no family history of breast cancer  . She had been receiving neoadjuvant therapy for her triple negative cancer. She had received 3 biopsies of left breast. Pre-treatment her lymph nodes were clinically negative. She has since had a follow-up MRI which showed significant improvement left side with 2 new areas on her right breast. 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 She has finished chemotherapy and now the decision made to proceed with surgery for her breast cancer. She is currently doing well   Past Surgical History  Hysterectomy (due to cancer) - Partial   Diagnostic Studies History Colonoscopy  1-5 years ago Mammogram  within last year Pap Smear  >5 years ago  Medication History  Medications Reconciled  Social History  Alcohol use  Occasional alcohol use. Caffeine use  Tea. No drug use  Tobacco use  Former smoker.  Family History  Diabetes Mellitus  Mother.  Pregnancy / Birth History  Age at menarche  62 years. Age of menopause  51-55 Contraceptive History  Oral contraceptives. Gravida   2 Maternal age  1-25 Para  2 Regular periods   Other Problems Conni Slipper, RN; 10/09/2019 8:17 AM) Arthritis  General anesthesia - complications  Heart murmur  High blood pressure  Sleep Apnea     Review of Systems  General Present- Weight Loss. Not Present- Appetite Loss, Chills, Fatigue, Fever, Night Sweats and Weight Gain. Skin Present- Rash. Not Present- Change in Wart/Mole, Dryness, Hives, Jaundice, New Lesions, Non-Healing Wounds and Ulcer. HEENT Present- Seasonal Allergies. Not Present- Earache, Hearing Loss, Hoarseness, Nose Bleed, Oral Ulcers, Ringing in the Ears, Sinus Pain, Sore Throat, Visual Disturbances, Wears glasses/contact lenses and Yellow Eyes. Respiratory Present- Chronic Cough. Not Present- Bloody sputum, Difficulty Breathing, Snoring and Wheezing. Breast Present- Breast Mass. Not Present- Breast Pain, Nipple Discharge and Skin Changes. Cardiovascular Not Present- Chest Pain, Difficulty Breathing Lying Down, Leg Cramps, Palpitations, Rapid Heart Rate, Shortness of Breath and Swelling of Extremities. Gastrointestinal Not Present- Abdominal Pain, Bloating, Bloody Stool, Change in Bowel Habits, Chronic diarrhea, Constipation, Difficulty Swallowing, Excessive gas, Gets full quickly at meals, Hemorrhoids, Indigestion, Nausea, Rectal Pain and Vomiting. Female Genitourinary Not Present- Frequency, Nocturia, Painful Urination, Pelvic Pain and Urgency. Musculoskeletal Not Present- Back Pain, Joint Pain, Joint Stiffness, Muscle Pain, Muscle Weakness and Swelling of Extremities. Neurological Not Present- Decreased Memory, Fainting, Headaches, Numbness, Seizures, Tingling, Tremor, Trouble walking and Weakness. Psychiatric Present- Frequent crying. Not Present- Anxiety, Bipolar, Change in Sleep Pattern, Depression and Fearful. Endocrine Not Present- Cold Intolerance, Excessive Hunger, Hair Changes, Heat Intolerance, Hot flashes and New Diabetes. Hematology Not Present-  Blood Thinners, Easy Bruising, Excessive bleeding, Gland problems,  HIV and Persistent Infections.   Physical Exam The physical exam findings are as follows: Note: She appears well in exam.  There is a very mild fullness in the upper outer quadrant of the left breast and no other palpable abnormalities in either breast and no axillary adenopathy on either side.  I have again reviewed her MRI and previous studies as well as pathology   Assessment & Plan   BREAST CANCER, LEFT (C50.912) BREAST NEOPLASM, TIS (DCIS), RIGHT (D05.11)  Impression: I have reviewed her notes in the electronic medical records. I have reviewed her mammogram, ultrasound, and pathology results. We also discussed her this morning in our multidisciplinary breast cancer conference. She has a large left breast cancer which is triple negative with a Ki-67 of 90%. Neoadjuvant therapy has been recommended if she desires breast conservation. After a discussion with her and her husband she does wish to proceed with breast conservation. She will need a Port-A-Cath inserted for neoadjuvant chemotherapy. I discussed this with him. Given my operative schedule over the next week, I do not have any operative time to perform the procedure so this will have to be scheduled through interventional radiology. Hopefully, she will have a clinical response and we can then proceed with breast conservation when she has completed chemotherapy. She is going to be getting an MRI to evaluate the extent of disease as well. Genetics testing will also be performed. Currently, they agree with plans. Things may change depending on her genetic status regarding her eventual surgery    ADDENDUM: This continues to remain a difficult decision for her. We again discussed proceeding with bilateral radioactive seed-guided lumpectomies as well as a left axillary sentinel lymph node biopsy. These areas are still fairly large we discussed the risk of having positive  margins times surgery. She would then require postoperative radiation. We also discussed proceeding with bilateral mastectomies. She declined any reconstruction if she decides on mastectomies.  After long discussion with her husband she tentatively wants to proceed with bilateral radioactive seed sided lumpectomies which would require 2 seeds on the right and 3 on the left as well as sentinel node biopsy on the left side.  We again discussed the risks of bleeding, infection, injury to surrounding structures, the need for further surgery is the margins or nodes are positive, cardiopulmonary issues, DVT, etc. Oncology has requested that the port remain.

## 2020-04-16 ENCOUNTER — Ambulatory Visit (HOSPITAL_BASED_OUTPATIENT_CLINIC_OR_DEPARTMENT_OTHER): Payer: Medicare HMO | Admitting: Anesthesiology

## 2020-04-16 ENCOUNTER — Ambulatory Visit
Admission: RE | Admit: 2020-04-16 | Discharge: 2020-04-16 | Disposition: A | Discharge status: Home / Self Care | Payer: Medicare HMO | Source: Ambulatory Visit | Attending: Surgery | Admitting: Surgery

## 2020-04-16 ENCOUNTER — Ambulatory Visit (HOSPITAL_BASED_OUTPATIENT_CLINIC_OR_DEPARTMENT_OTHER)
Admission: RE | Admit: 2020-04-16 | Discharge: 2020-04-16 | Disposition: A | Discharge status: Home / Self Care | Payer: Medicare HMO | Attending: Surgery | Admitting: Surgery

## 2020-04-16 ENCOUNTER — Encounter (HOSPITAL_BASED_OUTPATIENT_CLINIC_OR_DEPARTMENT_OTHER)
Admission: RE | Disposition: A | Discharge status: Home / Self Care | Payer: Self-pay | Source: Home / Self Care | Attending: Surgery

## 2020-04-16 ENCOUNTER — Encounter (HOSPITAL_BASED_OUTPATIENT_CLINIC_OR_DEPARTMENT_OTHER): Payer: Self-pay | Admitting: Surgery

## 2020-04-16 ENCOUNTER — Encounter (HOSPITAL_COMMUNITY)
Admission: RE | Admit: 2020-04-16 | Discharge: 2020-04-16 | Disposition: A | Discharge status: Home / Self Care | Payer: Medicare HMO | Source: Ambulatory Visit | Attending: Surgery | Admitting: Surgery

## 2020-04-16 ENCOUNTER — Other Ambulatory Visit: Payer: Self-pay

## 2020-04-16 DIAGNOSIS — Z171 Estrogen receptor negative status [ER-]: Secondary | ICD-10-CM | POA: Diagnosis not present

## 2020-04-16 DIAGNOSIS — G8918 Other acute postprocedural pain: Secondary | ICD-10-CM | POA: Diagnosis not present

## 2020-04-16 DIAGNOSIS — Z853 Personal history of malignant neoplasm of breast: Secondary | ICD-10-CM

## 2020-04-16 DIAGNOSIS — Z170 Estrogen receptor positive status [ER+]: Secondary | ICD-10-CM | POA: Insufficient documentation

## 2020-04-16 DIAGNOSIS — D0511 Intraductal carcinoma in situ of right breast: Secondary | ICD-10-CM

## 2020-04-16 DIAGNOSIS — C50412 Malignant neoplasm of upper-outer quadrant of left female breast: Secondary | ICD-10-CM | POA: Diagnosis not present

## 2020-04-16 DIAGNOSIS — C50912 Malignant neoplasm of unspecified site of left female breast: Secondary | ICD-10-CM | POA: Diagnosis not present

## 2020-04-16 DIAGNOSIS — C50911 Malignant neoplasm of unspecified site of right female breast: Secondary | ICD-10-CM | POA: Diagnosis not present

## 2020-04-16 HISTORY — PX: BREAST LUMPECTOMY WITH RADIOACTIVE SEED AND SENTINEL LYMPH NODE BIOPSY: SHX6550

## 2020-04-16 HISTORY — PX: BREAST LUMPECTOMY WITH RADIOACTIVE SEED LOCALIZATION: SHX6424

## 2020-04-16 IMAGING — MG MM BREAST SURGICAL SPECIMEN
1 series · 2 of 2 positions shown · non-contrast
Comparison: Previous exam(s).

CLINICAL DATA: Specimen radiograph status post right breast
lumpectomy.

EXAM:
SPECIMEN RADIOGRAPH OF THE RIGHT BREAST

[Series 1: R · right · 0.07mm/px · 2 of 2 slices shown]
[im 1/2]
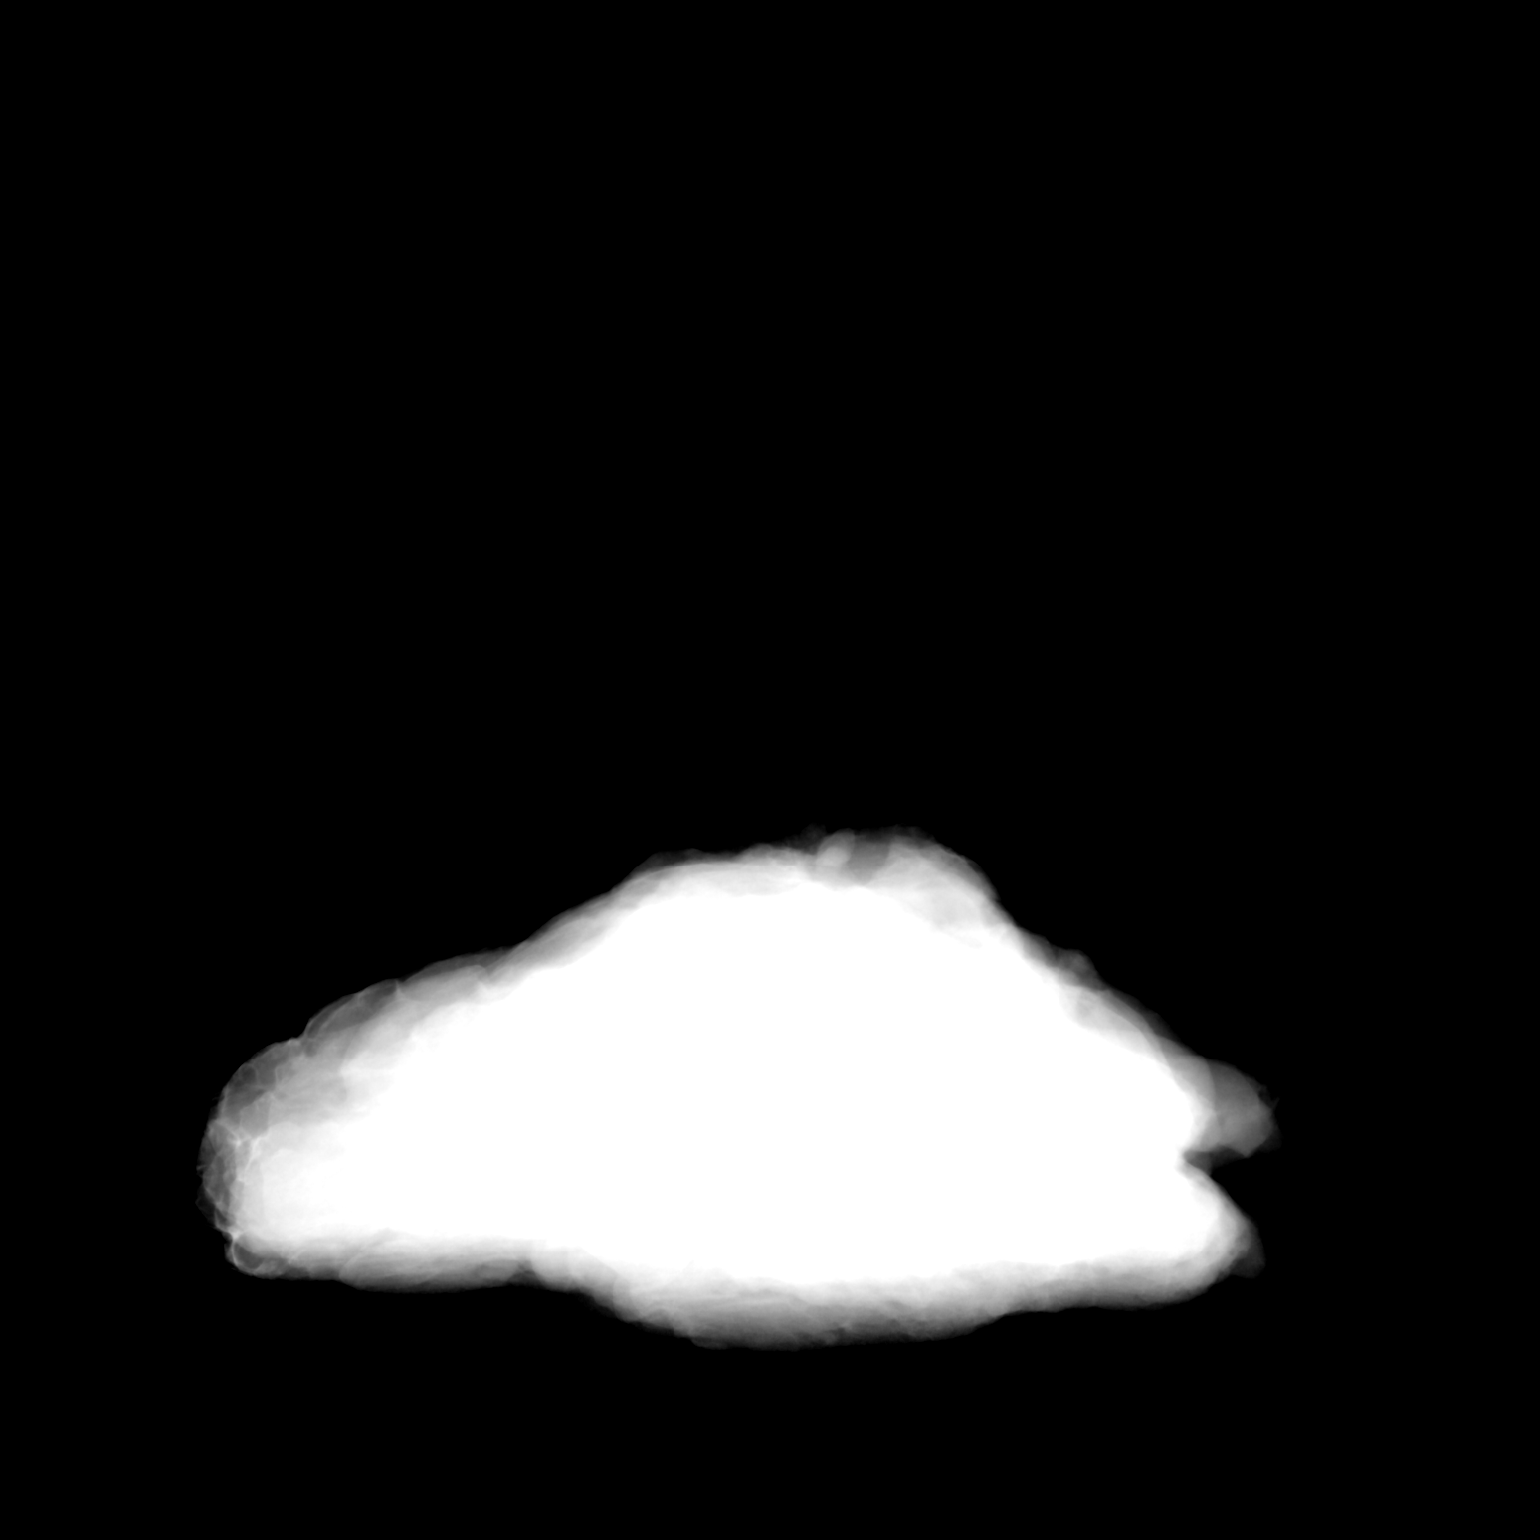
[im 2/2]
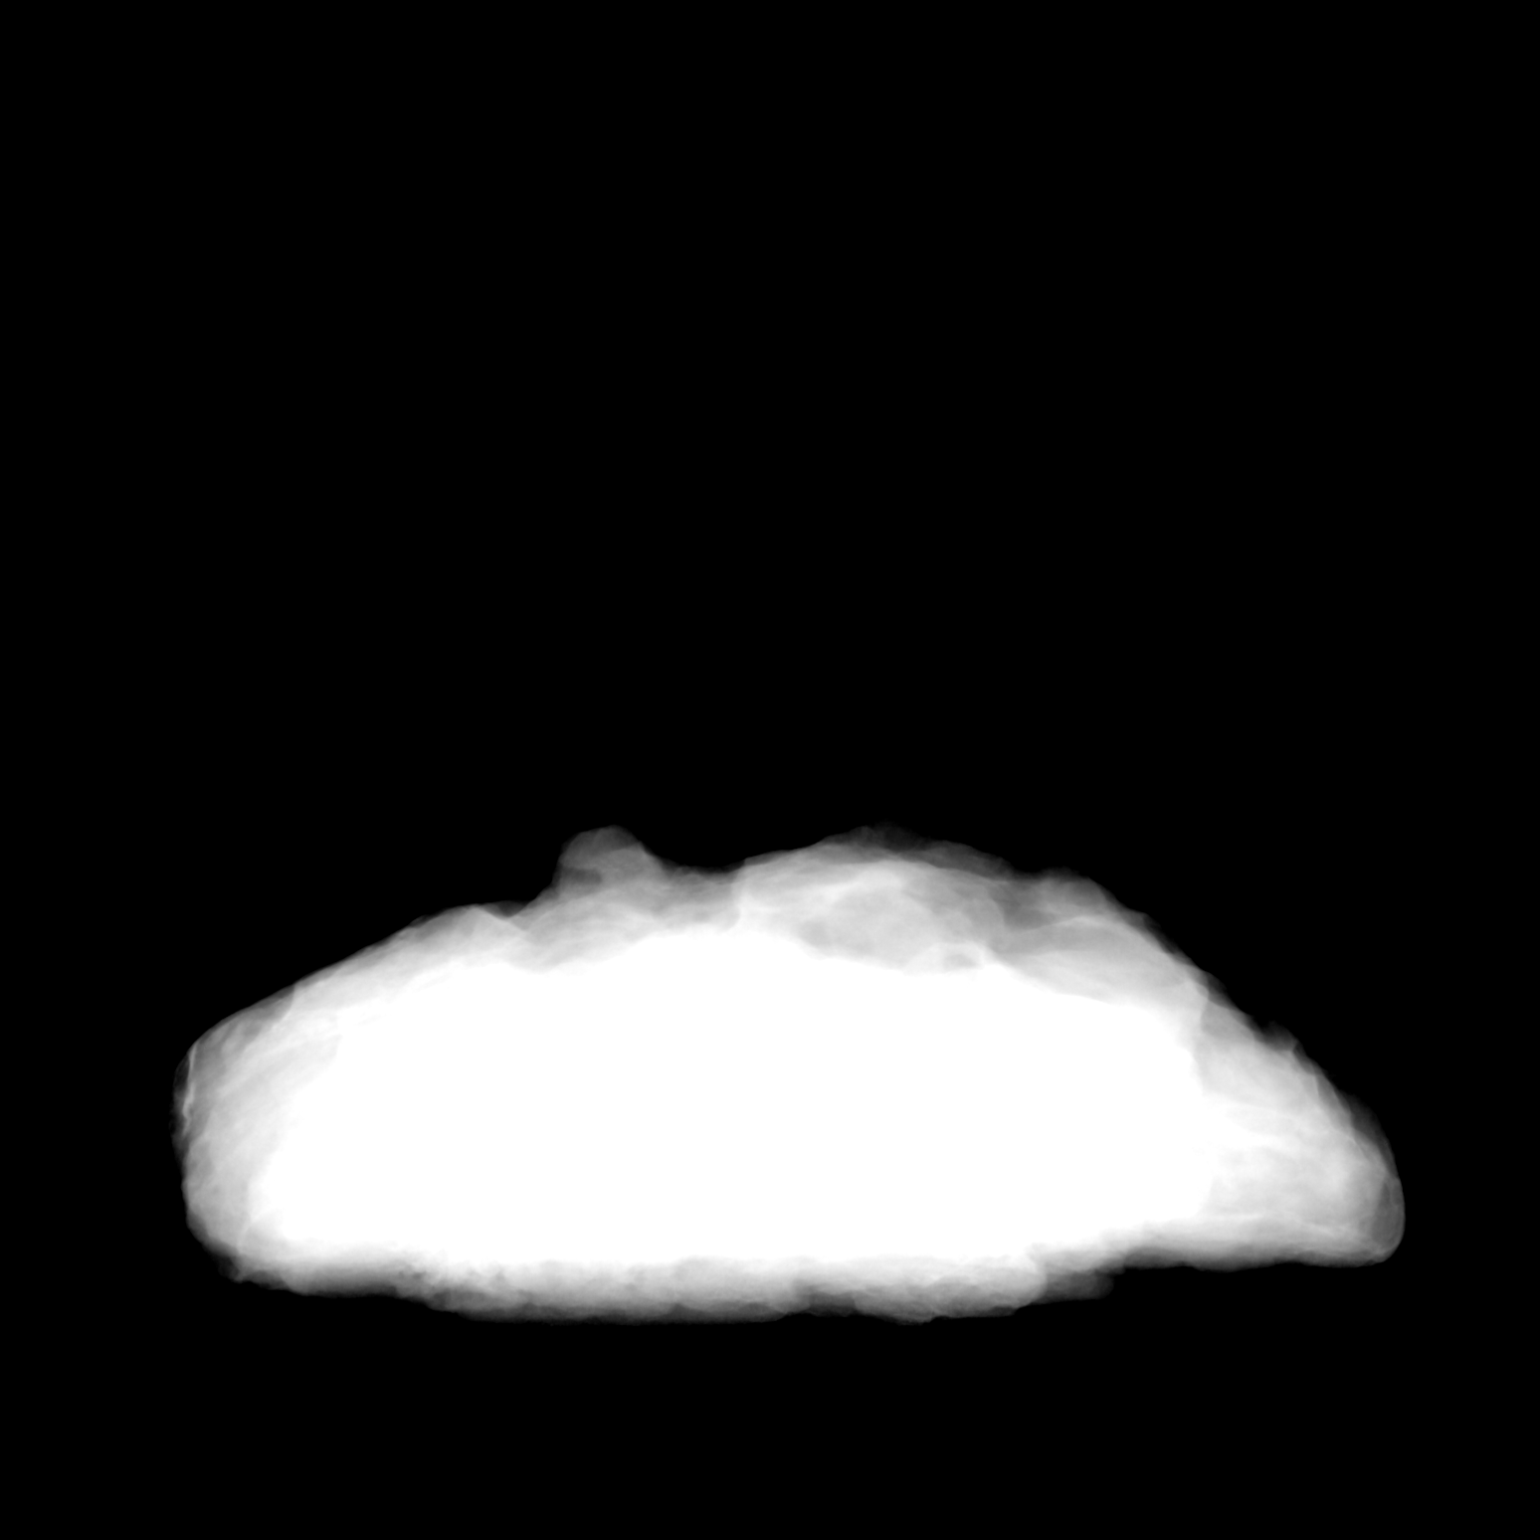

[2 of 2 positions shown; findings below may reference images not displayed]

FINDINGS: Status post excision of the right breast. The radioactive seeds and
biopsy marker clips are present, completely intact, and were marked
for pathology. These findings were communicated with the OR.
IMPRESSION: Specimen radiograph of the right breast.

## 2020-04-16 IMAGING — MG MM BREAST SURGICAL SPECIMEN
1 series · 2 of 2 positions shown · non-contrast
Comparison: Previous exam(s).

CLINICAL DATA: Specimen radiograph status post left breast
lumpectomy.

EXAM:
SPECIMEN RADIOGRAPH OF THE LEFT BREAST

[Series 2: L · left · 0.07mm/px · 2 of 2 slices shown]
[im 1/2]
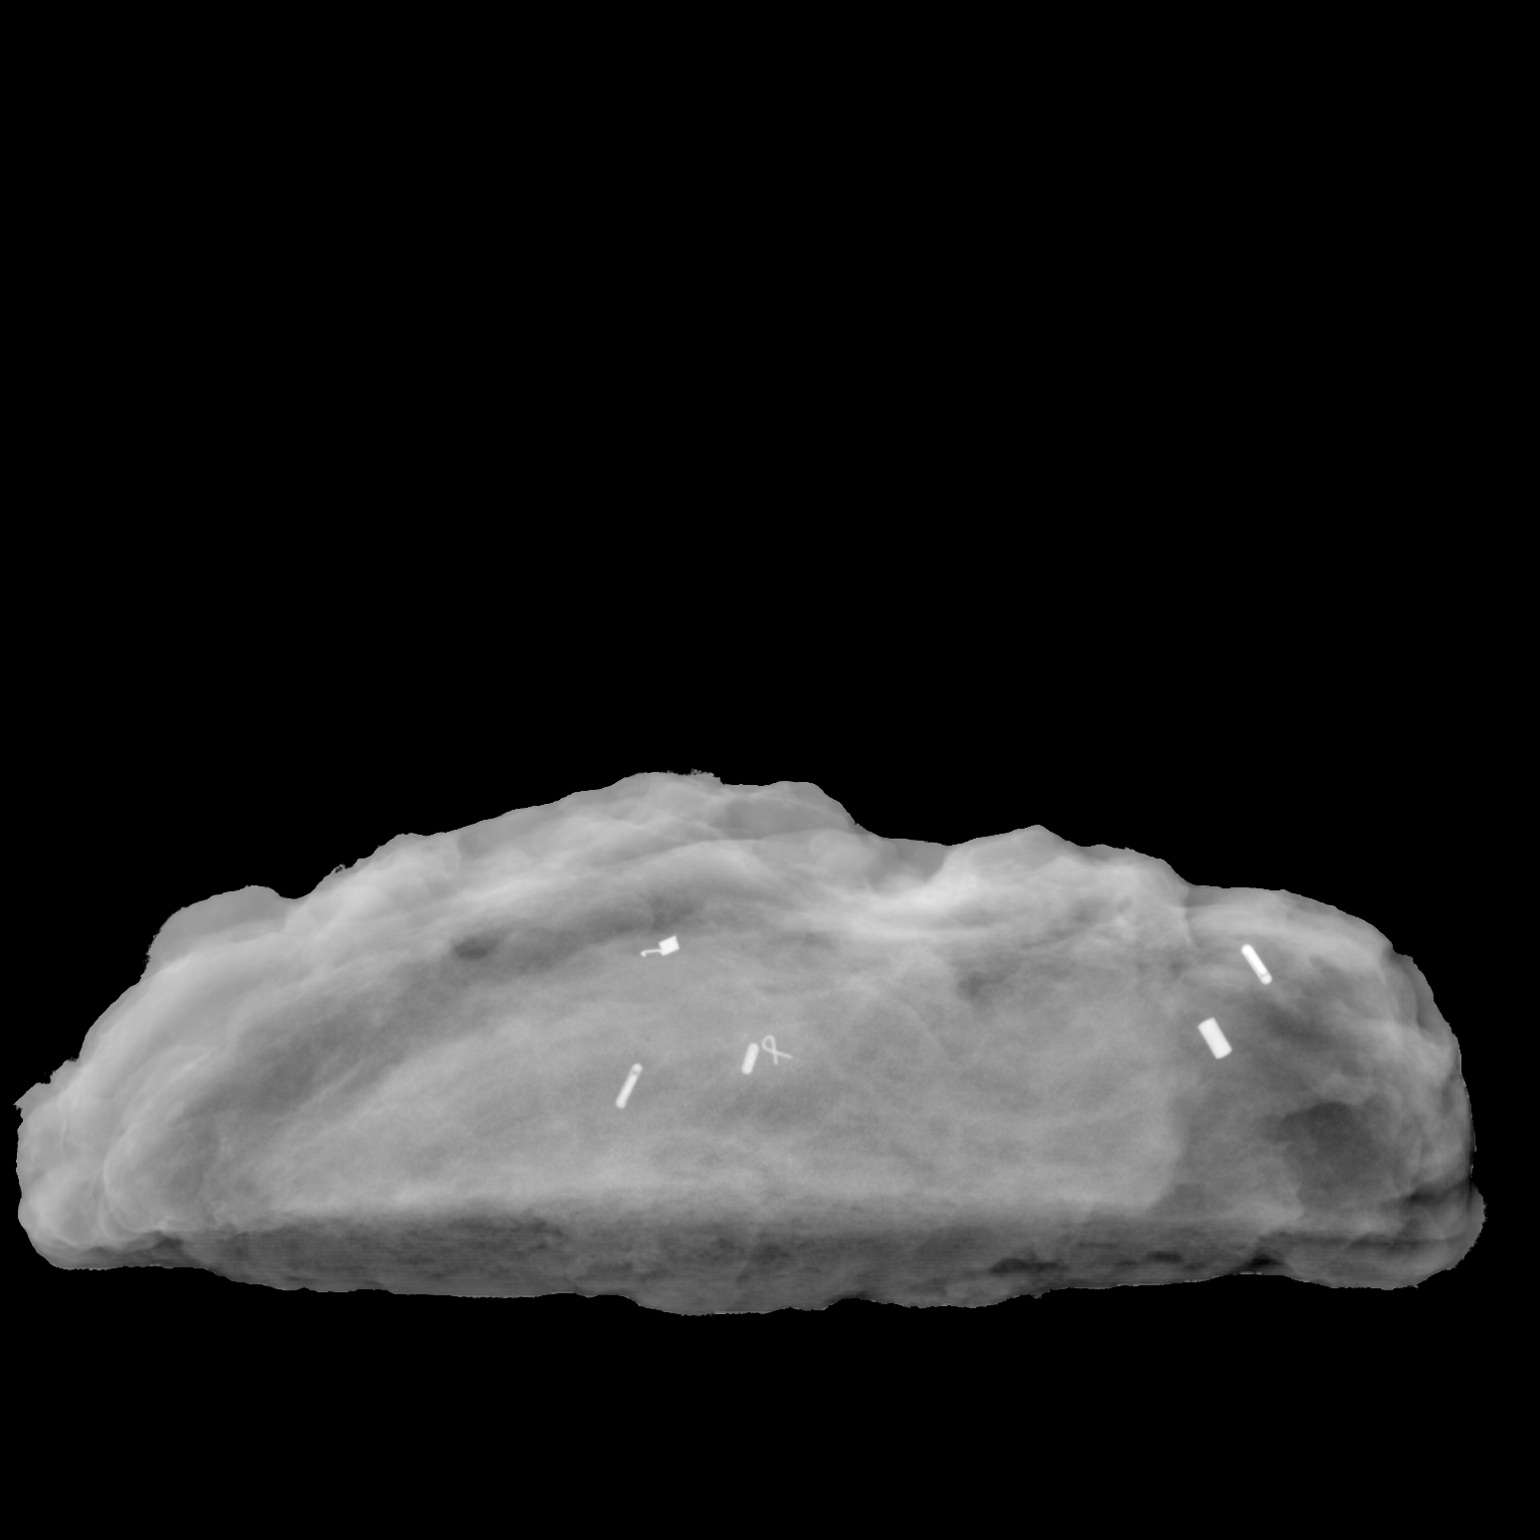
[im 2/2]
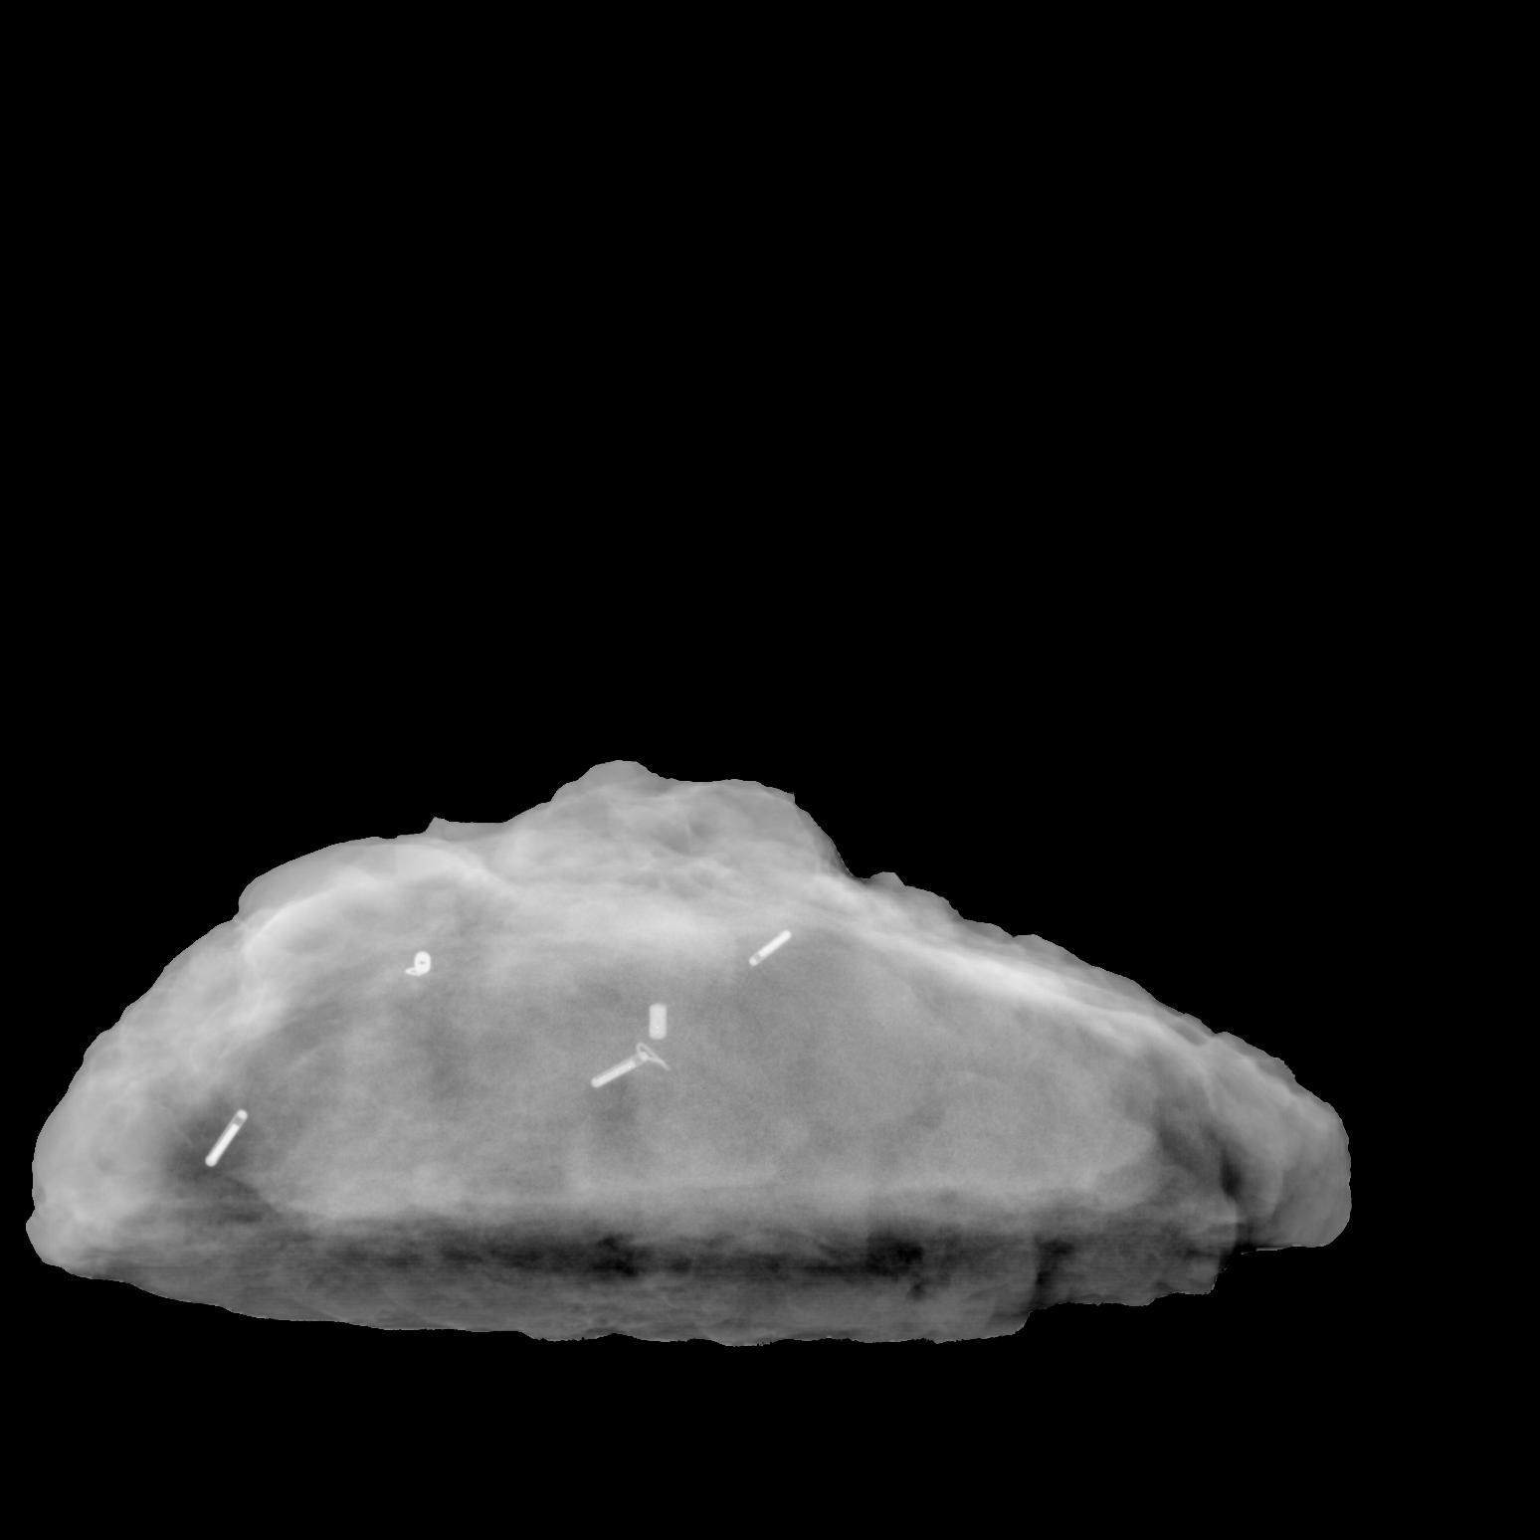

[2 of 2 positions shown; findings below may reference images not displayed]

FINDINGS: Status post excision of the left breast. The 3 radioactive seed and
3 biopsy marker clips are present present and completely intact.
These findings were communicated with the OR at [DATE] a.m.
IMPRESSION: Specimen radiograph of the left breast.

## 2020-04-16 SURGERY — BREAST LUMPECTOMY WITH RADIOACTIVE SEED LOCALIZATION
Anesthesia: General | Site: Breast | Laterality: Right

## 2020-04-16 MED ORDER — CEFAZOLIN SODIUM-DEXTROSE 2-4 GM/100ML-% IV SOLN
INTRAVENOUS | Status: AC
  Filled 2020-04-16: qty 100

## 2020-04-16 MED ORDER — OXYCODONE HCL 5 MG PO TABS
ORAL_TABLET | ORAL | Status: AC
  Filled 2020-04-16: qty 1

## 2020-04-16 MED ORDER — METHYLENE BLUE 0.5 % INJ SOLN
INTRAVENOUS | Status: AC
  Filled 2020-04-16: qty 10

## 2020-04-16 MED ORDER — OXYCODONE HCL 5 MG PO TABS: 5.0000 mg | ORAL_TABLET | Freq: Four times a day (QID) | ORAL | 0 refills | Status: DC | PRN

## 2020-04-16 MED ORDER — PROPOFOL 10 MG/ML IV BOLUS
INTRAVENOUS | Status: DC | PRN
  Administered 2020-04-16: 150 mg via INTRAVENOUS

## 2020-04-16 MED ORDER — FENTANYL CITRATE (PF) 100 MCG/2ML IJ SOLN
INTRAMUSCULAR | Status: AC
  Filled 2020-04-16: qty 2

## 2020-04-16 MED ORDER — ROPIVACAINE HCL 5 MG/ML IJ SOLN
INTRAMUSCULAR | Status: DC | PRN
  Administered 2020-04-16: 30 mL via PERINEURAL

## 2020-04-16 MED ORDER — FENTANYL CITRATE (PF) 100 MCG/2ML IJ SOLN
100.0000 ug | Freq: Once | INTRAMUSCULAR | Status: AC
  Administered 2020-04-16: 50 ug via INTRAVENOUS

## 2020-04-16 MED ORDER — ENSURE PRE-SURGERY PO LIQD: 296.0000 mL | Freq: Once | ORAL | Status: DC

## 2020-04-16 MED ORDER — SODIUM CHLORIDE (PF) 0.9 % IJ SOLN
INTRAMUSCULAR | Status: AC
  Filled 2020-04-16: qty 10

## 2020-04-16 MED ORDER — CHLORHEXIDINE GLUCONATE CLOTH 2 % EX PADS: 6.0000 | MEDICATED_PAD | Freq: Once | CUTANEOUS | Status: DC

## 2020-04-16 MED ORDER — OXYCODONE HCL 5 MG PO TABS
5.0000 mg | ORAL_TABLET | Freq: Once | ORAL | Status: AC | PRN
  Administered 2020-04-16: 5 mg via ORAL

## 2020-04-16 MED ORDER — BUPIVACAINE-EPINEPHRINE 0.5% -1:200000 IJ SOLN
INTRAMUSCULAR | Status: DC | PRN
  Administered 2020-04-16: 20 mL

## 2020-04-16 MED ORDER — ACETAMINOPHEN 500 MG PO TABS
ORAL_TABLET | ORAL | Status: AC
  Filled 2020-04-16: qty 2

## 2020-04-16 MED ORDER — MIDAZOLAM HCL 2 MG/2ML IJ SOLN
INTRAMUSCULAR | Status: AC
  Filled 2020-04-16: qty 2

## 2020-04-16 MED ORDER — LIDOCAINE 2% (20 MG/ML) 5 ML SYRINGE
INTRAMUSCULAR | Status: AC
  Filled 2020-04-16: qty 5

## 2020-04-16 MED ORDER — HYDROMORPHONE HCL 1 MG/ML IJ SOLN: 0.2500 mg | INTRAMUSCULAR | Status: DC | PRN

## 2020-04-16 MED ORDER — LIDOCAINE HCL (CARDIAC) PF 100 MG/5ML IV SOSY
PREFILLED_SYRINGE | INTRAVENOUS | Status: DC | PRN
  Administered 2020-04-16: 50 mg via INTRATRACHEAL

## 2020-04-16 MED ORDER — PROMETHAZINE HCL 25 MG/ML IJ SOLN: 6.2500 mg | INTRAMUSCULAR | Status: DC | PRN

## 2020-04-16 MED ORDER — ONDANSETRON HCL 4 MG/2ML IJ SOLN
INTRAMUSCULAR | Status: AC
  Filled 2020-04-16: qty 2

## 2020-04-16 MED ORDER — PROPOFOL 10 MG/ML IV BOLUS
INTRAVENOUS | Status: AC
  Filled 2020-04-16: qty 20

## 2020-04-16 MED ORDER — LACTATED RINGERS IV SOLN: INTRAVENOUS | Status: DC

## 2020-04-16 MED ORDER — DEXAMETHASONE SODIUM PHOSPHATE 10 MG/ML IJ SOLN
INTRAMUSCULAR | Status: DC | PRN
  Administered 2020-04-16: 5 mg via INTRAVENOUS

## 2020-04-16 MED ORDER — FENTANYL CITRATE (PF) 100 MCG/2ML IJ SOLN
INTRAMUSCULAR | Status: DC | PRN
  Administered 2020-04-16: 50 ug via INTRAVENOUS
  Administered 2020-04-16 (×2): 25 ug via INTRAVENOUS

## 2020-04-16 MED ORDER — DEXAMETHASONE SODIUM PHOSPHATE 10 MG/ML IJ SOLN
INTRAMUSCULAR | Status: AC
  Filled 2020-04-16: qty 1

## 2020-04-16 MED ORDER — MIDAZOLAM HCL 2 MG/2ML IJ SOLN
2.0000 mg | Freq: Once | INTRAMUSCULAR | Status: AC
  Administered 2020-04-16: 2 mg via INTRAVENOUS

## 2020-04-16 MED ORDER — CEFAZOLIN SODIUM-DEXTROSE 2-4 GM/100ML-% IV SOLN
2.0000 g | INTRAVENOUS | Status: AC
  Administered 2020-04-16: 2 g via INTRAVENOUS

## 2020-04-16 MED ORDER — ACETAMINOPHEN 500 MG PO TABS
1000.0000 mg | ORAL_TABLET | ORAL | Status: AC
  Administered 2020-04-16: 1000 mg via ORAL

## 2020-04-16 MED ORDER — AMISULPRIDE (ANTIEMETIC) 5 MG/2ML IV SOLN: 10.0000 mg | Freq: Once | INTRAVENOUS | Status: DC | PRN

## 2020-04-16 MED ORDER — ONDANSETRON HCL 4 MG/2ML IJ SOLN
INTRAMUSCULAR | Status: DC | PRN
  Administered 2020-04-16: 4 mg via INTRAVENOUS

## 2020-04-16 MED ORDER — TECHNETIUM TC 99M TILMANOCEPT KIT
1.0000 | PACK | Freq: Once | INTRAVENOUS | Status: AC | PRN
  Administered 2020-04-16: 1 via INTRADERMAL

## 2020-04-16 MED ORDER — OXYCODONE HCL 5 MG/5ML PO SOLN: 5.0000 mg | Freq: Once | ORAL | Status: AC | PRN

## 2020-04-16 SURGICAL SUPPLY — 54 items
ADH SKN CLS APL DERMABOND .7 (GAUZE/BANDAGES/DRESSINGS) ×4
APL PRP STRL LF DISP 70% ISPRP (MISCELLANEOUS) ×4
APPLIER CLIP 9.375 MED OPEN (MISCELLANEOUS) ×3
APR CLP MED 9.3 20 MLT OPN (MISCELLANEOUS) ×2
BINDER BREAST 3XL (GAUZE/BANDAGES/DRESSINGS) IMPLANT
BINDER BREAST LRG (GAUZE/BANDAGES/DRESSINGS) IMPLANT
BINDER BREAST MEDIUM (GAUZE/BANDAGES/DRESSINGS) IMPLANT
BINDER BREAST XLRG (GAUZE/BANDAGES/DRESSINGS) IMPLANT
BINDER BREAST XXLRG (GAUZE/BANDAGES/DRESSINGS) ×1 IMPLANT
BLADE SURG 15 STRL LF DISP TIS (BLADE) ×2 IMPLANT
BLADE SURG 15 STRL SS (BLADE) ×6
CANISTER SUC SOCK COL 7IN (MISCELLANEOUS) IMPLANT
CANISTER SUCT 1200ML W/VALVE (MISCELLANEOUS) IMPLANT
CHLORAPREP W/TINT 26 (MISCELLANEOUS) ×4 IMPLANT
CLIP APPLIE 9.375 MED OPEN (MISCELLANEOUS) ×2 IMPLANT
CLIP VESOCCLUDE SM WIDE 6/CT (CLIP) IMPLANT
COVER BACK TABLE 60X90IN (DRAPES) ×3 IMPLANT
COVER MAYO STAND STRL (DRAPES) ×3 IMPLANT
COVER PROBE W GEL 5X96 (DRAPES) ×3 IMPLANT
COVER WAND RF STERILE (DRAPES) IMPLANT
DECANTER SPIKE VIAL GLASS SM (MISCELLANEOUS) IMPLANT
DERMABOND ADVANCED (GAUZE/BANDAGES/DRESSINGS) ×2
DERMABOND ADVANCED .7 DNX12 (GAUZE/BANDAGES/DRESSINGS) ×3 IMPLANT
DRAPE LAPAROSCOPIC ABDOMINAL (DRAPES) ×3 IMPLANT
DRAPE UTILITY XL STRL (DRAPES) ×3 IMPLANT
ELECT REM PT RETURN 9FT ADLT (ELECTROSURGICAL) ×3
ELECTRODE REM PT RTRN 9FT ADLT (ELECTROSURGICAL) ×2 IMPLANT
GAUZE SPONGE 4X4 12PLY STRL LF (GAUZE/BANDAGES/DRESSINGS) IMPLANT
GLOVE SURG ENC MOIS LTX SZ6.5 (GLOVE) ×2 IMPLANT
GLOVE SURG SIGNA 7.5 PF LTX (GLOVE) ×3 IMPLANT
GLOVE SURG UNDER POLY LF SZ6.5 (GLOVE) ×2 IMPLANT
GOWN STRL REUS W/ TWL LRG LVL3 (GOWN DISPOSABLE) ×1 IMPLANT
GOWN STRL REUS W/ TWL XL LVL3 (GOWN DISPOSABLE) ×2 IMPLANT
GOWN STRL REUS W/TWL LRG LVL3 (GOWN DISPOSABLE)
GOWN STRL REUS W/TWL XL LVL3 (GOWN DISPOSABLE) ×6
KIT MARKER MARGIN INK (KITS) ×3 IMPLANT
NDL HYPO 25X1 1.5 SAFETY (NEEDLE) ×1 IMPLANT
NDL SAFETY ECLIPSE 18X1.5 (NEEDLE) ×2 IMPLANT
NEEDLE HYPO 18GX1.5 SHARP (NEEDLE)
NEEDLE HYPO 25X1 1.5 SAFETY (NEEDLE) ×3 IMPLANT
NS IRRIG 1000ML POUR BTL (IV SOLUTION) ×3 IMPLANT
PACK BASIN DAY SURGERY FS (CUSTOM PROCEDURE TRAY) ×3 IMPLANT
PENCIL SMOKE EVACUATOR (MISCELLANEOUS) ×3 IMPLANT
SLEEVE SCD COMPRESS KNEE MED (STOCKING) ×3 IMPLANT
SPONGE LAP 4X18 RFD (DISPOSABLE) ×4 IMPLANT
SUT MNCRL AB 4-0 PS2 18 (SUTURE) ×5 IMPLANT
SUT SILK 2 0 SH (SUTURE) IMPLANT
SUT VIC AB 3-0 SH 27 (SUTURE) ×9
SUT VIC AB 3-0 SH 27X BRD (SUTURE) ×4 IMPLANT
SYR CONTROL 10ML LL (SYRINGE) ×3 IMPLANT
TOWEL GREEN STERILE FF (TOWEL DISPOSABLE) ×3 IMPLANT
TRAY FAXITRON CT DISP (TRAY / TRAY PROCEDURE) ×5 IMPLANT
TUBE CONNECTING 20X1/4 (TUBING) IMPLANT
YANKAUER SUCT BULB TIP NO VENT (SUCTIONS) IMPLANT

## 2020-04-16 NOTE — Progress Notes (Signed)
Nuc med at bedside for injections on the left breast.  Pt tolerate procedure well, and VSS.

## 2020-04-16 NOTE — Interval H&P Note (Signed)
History and Physical Interval Note: no change in H and P  04/16/2020 7:14 AM  Cheyenne Mcdonald  has presented today for surgery, with the diagnosis of BILATERAL BREAST CANCER.  The various methods of treatment have been discussed with the patient and family. After consideration of risks, benefits and other options for treatment, the patient has consented to  Procedure(s): RIGHT BREAST LUMPECTOMY WITH RADIOACTIVE SEED LOCALIZATION (Right) LEFT BREAST LUMPECTOMY WITH RADIOACTIVE SEED AND LEFT AXILLARY SENTINEL LYMPH NODE BIOPSY (Left) as a surgical intervention.  The patient's history has been reviewed, patient examined, no change in status, stable for surgery.  I have reviewed the patient's chart and labs.  Questions were answered to the patient's satisfaction.     [name]

## 2020-04-16 NOTE — Discharge Instructions (Signed)
[facility] Office Phone Number [phone]  BREAST BIOPSY/ PARTIAL MASTECTOMY: POST OP INSTRUCTIONS  Always review your discharge instruction sheet given to you by the facility where your surgery was performed.  IF YOU HAVE DISABILITY OR FAMILY LEAVE FORMS, YOU MUST BRING THEM TO THE OFFICE FOR PROCESSING.  DO NOT GIVE THEM TO YOUR DOCTOR.  1. A prescription for pain medication may be given to you upon discharge.  Take your pain medication as prescribed, if needed.  If narcotic pain medicine is not needed, then you may take acetaminophen (Tylenol) or ibuprofen (Advil) as needed. 2. Take your usually prescribed medications unless otherwise directed 3. If you need a refill on your pain medication, please contact your pharmacy.  They will contact our office to request authorization.  Prescriptions will not be filled after 5pm or on week-ends. 4. You should eat very light the first 24 hours after surgery, such as soup, crackers, pudding, etc.  Resume your normal diet the day after surgery. 5. Most patients will experience some swelling and bruising in the breast.  Ice packs and a good support bra will help.  Swelling and bruising can take several days to resolve.  6. It is common to experience some constipation if taking pain medication after surgery.  Increasing fluid intake and taking a stool softener will usually help or prevent this problem from occurring.  A mild laxative (Milk of Magnesia or Miralax) should be taken according to package directions if there are no bowel movements after 48 hours. 7. Unless discharge instructions indicate otherwise, you may remove your bandages 24-48 hours after surgery, and you may shower at that time.  You may have steri-strips (small skin tapes) in place directly over the incision.  These strips should be left on the skin for 7-10 days.  If your surgeon used skin glue on the incision, you may shower in 24 hours.  The glue will flake off over the  next 2-3 weeks.  Any sutures or staples will be removed at the office during your follow-up visit. 8. ACTIVITIES:  You may resume regular daily activities (gradually increasing) beginning the next day.  Wearing a good support bra or sports bra minimizes pain and swelling.  You may have sexual intercourse when it is comfortable. a. You may drive when you no longer are taking prescription pain medication, you can comfortably wear a seatbelt, and you can safely maneuver your car and apply brakes. b. RETURN TO WORK:  ______________________________________________________________________________________ 9. You should see your doctor in the office for a follow-up appointment approximately two weeks after your surgery.  Your doctor's nurse will typically make your follow-up appointment when she calls you with your pathology report.  Expect your pathology report 2-3 business days after your surgery.  You may call to check if you do not hear from [place] after three days. 10. OTHER INSTRUCTIONS:OK TO REMOVE BINDER AND SHOWER STARTING TOMORROW 11. ICE PACK,TYLENOL, AND IBUPROFEN ALSO FOR PAIN 12. NO VIGOROUS ACTIVITY FOR ONE WEEK _______________________________________________________________________________________________ _____________________________________________________________________________________________________________________________________ _____________________________________________________________________________________________________________________________________ _____________________________________________________________________________________________________________________________________  WHEN TO CALL YOUR DOCTOR: 1. Fever over 101.0 2. Nausea and/or vomiting. 3. Extreme swelling or bruising. 4. Continued bleeding from incision. 5. Increased pain, redness, or drainage from the incision.  The clinic staff is available to answer your questions during regular business hours.  Please  don't hesitate to call and ask to speak to one of the nurses for clinical concerns.  If you have a medical emergency, go to the nearest emergency room or call 911.  A surgeon from [facility] is always on call at the hospital. For further questions, please visit [email or web address]   No Tylenol until 12:51 pm   Post Anesthesia Home Care Instructions  Activity: Get plenty of rest for the remainder of the day. A responsible individual must stay with you for 24 hours following the procedure.  For the next 24 hours, DO NOT: -Drive a car -Paediatric nurse -Drink alcoholic beverages -Take any medication unless instructed by your physician -Make any legal decisions or sign important papers.  Meals: Start with liquid foods such as gelatin or soup. Progress to regular foods as tolerated. Avoid greasy, spicy, heavy foods. If nausea and/or vomiting occur, drink only clear liquids until the nausea and/or vomiting subsides. Call your physician if vomiting continues.  Special Instructions/Symptoms: Your throat may feel dry or sore from the anesthesia or the breathing tube placed in your throat during surgery. If this causes discomfort, gargle with warm salt water. The discomfort should disappear within 24 hours.  If you had a scopolamine patch placed behind your ear for the management of post- operative nausea and/or vomiting:  1. The medication in the patch is effective for 72 hours, after which it should be removed.  Wrap patch in a tissue and discard in the trash. Wash hands thoroughly with soap and water. 2. You may remove the patch earlier than 72 hours if you experience unpleasant side effects which may include dry mouth, dizziness or visual disturbances. 3. Avoid touching the patch. Wash your hands with soap and water after contact with the patch.

## 2020-04-16 NOTE — Anesthesia Postprocedure Evaluation (Signed)
Anesthesia Post Note  Patient: Cheyenne Mcdonald  Procedure(s) Performed: RIGHT BREAST LUMPECTOMY WITH RADIOACTIVE SEED LOCALIZATION (Right Breast) LEFT BREAST LUMPECTOMY WITH RADIOACTIVE SEED AND LEFT AXILLARY SENTINEL LYMPH NODE BIOPSY (Left Breast)     Patient location during evaluation: PACU Anesthesia Type: General Level of consciousness: awake and alert Pain management: pain level controlled Vital Signs Assessment: post-procedure vital signs reviewed and stable Respiratory status: spontaneous breathing, nonlabored ventilation and respiratory function stable Cardiovascular status: blood pressure returned to baseline and stable Postop Assessment: no apparent nausea or vomiting Anesthetic complications: no   No complications documented.  Last Vitals:  Vitals:   04/16/20 1000 04/16/20 1015  BP: 125/75 123/71  Pulse: 60 60  Resp: 16 12  Temp:    SpO2: 100% 100%    Last Pain:  Vitals:   04/16/20 1015  TempSrc:   PainSc: [place]

## 2020-04-16 NOTE — Op Note (Signed)
Cheyenne Mcdonald 04/16/2020   Pre-op Diagnosis: BILATERAL BREAST CANCER     Post-op Diagnosis: same  Procedure(s): RIGHT BREAST LUMPECTOMY WITH RADIOACTIVE SEED LOCALIZATION LEFT BREAST LUMPECTOMY WITH RADIOACTIVE SEED AND LEFT DEEP  AXILLARY SENTINEL LYMPH NODE BIOPSY  Surgeon(s): [name], MD  Anesthesia: General  Staff:  Circulator: Maurene Capes, RN Scrub Person: Charisse March, RN Circulator Assistant: Rueben Bash, RN  Estimated Blood Loss: Minimal               Specimens: SENT TO PATH  Indications: This is a 70 year old female with screening mammography to have several suspicious areas in her left breast.  She underwent multiple biopsies showing 2 areas of invasive ductal carcinoma.  The third area was discordant.  She had a lymph node biopsied which was negative.  She was started on neoadjuvant chemotherapy.  Subsequently, she had a follow-up MRI which showed significant improvement in the left breast.  She, however, had 2 new suspicious areas on the right breast which were both biopsied and both showed ductal carcinoma in situ. The decision was made after long discussion to proceed with a radioactive seed guided lumpectomy to remove the 2 areas on the right breast as well as a radioactive seed guided lumpectomy of the left breast remove the areas there and perform a sentinel node biopsy as well in the left  Procedure:  The patient was brought to the operating room and identifies the correct patient.  She is placed upon the operating table general anesthesia was induced.  The decision was made to proceed with the right sided lumpectomy first.  I localized the 2 seeds with the neoprobe in the outer lower quadrant of the right breast.  I anesthetized the skin of the edge of the areola laterally with Marcaine.  I then made a circumareolar incision with a scalpel.  With the aid of the neoprobe I then dissected laterally and inferiorly toward the radioactive  seeds.  I then performed a wide lumpectomy staying around both seeds with 1 lumpectomy specimen going down to the chest wall.  I then completed the lumpectomy with the cautery attempting to stay widely around the radioactive seeds.  Once the lumpectomy specimen was removed, I marked it with paint at the margins.  An x-ray was performed confirming that the 2 previous biopsy clips and radioactive seeds were in the specimen.  The specimen was then sent to pathology for evaluation.  I achieved hemostasis with the cautery.  I placed surgical clips around the periphery of the biopsy cavity.  I then closed the subcutaneous tissue with interrupted 3-0 Vicryl sutures and closed the skin with a running 4-0 Monocryl. I next turned my attention toward the left breast.  She had 3 radioactive seeds in the upper outer quadrant of the left breast.  I decided to make an incision longitudinally near the right axilla after anesthetizing skin with Marcaine.  I made incision with a scalpel and then dissected down to the breast tissue with the cautery.  I then dissected with the cautery medially toward the radioactive seed with the aid of the neoprobe.  With the aid of the neoprobe I then performed a very wide lumpectomy staying around the higher season going down the chest wall and then working toward the lower radioactive seed.  I then completed a lumpectomy with a the neoprobe going down the chest wall and removing the very large lumpectomy specimen.  I marked all margins with paint.  An  x-ray was performed confirming that all 3 seeds and biopsy clips were in the specimen.  It was then sent to pathology for evaluation. Then using neoprobe identified an area of increased uptake in the deep left axilla.  I dissected in the deep axillary tissue with the cautery and identified several large lymph nodes with a the neoprobe.  I excised these lymph nodes together and sent on the pathology for evaluation.  I again evaluate the nodal basin  with the neoprobe and saw no further increased uptake.  I felt no other large palpable lymph nodes.  At this point hemostasis appeared to be achieved on the left side as well.  I placed surgical clips around the biopsy cavity for marker purposes.  I anesthetized the incision further with Marcaine.  I then closed the subcutaneous tissue with interrupted 3-0 Vicryl sutures and closed the skin with a running 4-0 Monocryl.  Dermabond was then applied to both incisions.  The patient was then placed in a breast binder.  She tolerated the procedure well.  All the counts were correct at the end of the procedure.  She was then extubated in the operating room and taken in a stable condition to the recovery room.          [name]   Date: 04/16/2020  Time: 9:17 AM

## 2020-04-16 NOTE — Transfer of Care (Signed)
Immediate Anesthesia Transfer of Care Note  Patient: Cheyenne Mcdonald  Procedure(s) Performed: RIGHT BREAST LUMPECTOMY WITH RADIOACTIVE SEED LOCALIZATION (Right Breast) LEFT BREAST LUMPECTOMY WITH RADIOACTIVE SEED AND LEFT AXILLARY SENTINEL LYMPH NODE BIOPSY (Left Breast)  Patient Location: PACU  Anesthesia Type:General  Level of Consciousness: drowsy, patient cooperative and responds to stimulation  Airway & Oxygen Therapy: Patient Spontanous Breathing and Patient connected to face mask oxygen  Post-op Assessment: Report given to RN and Post -op Vital signs reviewed and stable  Post vital signs: Reviewed and stable  Last Vitals:  Vitals Value Taken Time  BP    Temp    Pulse 90 04/16/20 0927  Resp 25 04/16/20 0927  SpO2 100 % 04/16/20 0927  Vitals shown include unvalidated device data.  Last Pain:  Vitals:   04/16/20 0646  TempSrc: [place]  PainSc: 0-No pain      Patients Stated Pain Goal: 4 (45/99/77 [id number])  Complications: No complications documented.

## 2020-04-16 NOTE — Anesthesia Procedure Notes (Signed)
Anesthesia Regional Block: Pectoralis block   Pre-Anesthetic Checklist: ,, timeout performed, Correct Patient, Correct Site, Correct Laterality, Correct Procedure, Correct Position, site marked, Risks and benefits discussed,  Surgical consent,  Pre-op evaluation,  At surgeon's request and post-op pain management  Laterality: Left  Prep: chloraprep       Needles:  Injection technique: Single-shot  Needle Type: Stimiplex     Needle Length: 9cm  Needle Gauge: 21     Additional Needles:   Procedures:,,,, ultrasound used (permanent image in chart),,,,  Narrative:  Start time: 04/16/2020 7:07 AM End time: 04/16/2020 7:12 AM Injection made incrementally with aspirations every 5 mL.  Performed by: Personally  Anesthesiologist: Lynda Rainwater, MD

## 2020-04-16 NOTE — Anesthesia Procedure Notes (Signed)
Procedure Name: LMA Insertion Date/Time: 04/16/2020 7:41 AM Performed by: Glory Buff, CRNA Pre-anesthesia Checklist: Patient identified, Emergency Drugs available, Suction available and Patient being monitored Patient Re-evaluated:Patient Re-evaluated prior to induction Oxygen Delivery Method: Circle system utilized Preoxygenation: Pre-oxygenation with 100% oxygen Induction Type: IV induction LMA: LMA inserted LMA Size: 4.0 Number of attempts: 1 Placement Confirmation: positive ETCO2 Tube secured with: Tape Dental Injury: Teeth and Oropharynx as per pre-operative assessment

## 2020-04-16 NOTE — Anesthesia Preprocedure Evaluation (Addendum)
Anesthesia Evaluation  Patient identified by MRN, date of birth, ID band Patient awake    Reviewed: Allergy & Precautions, NPO status , Patient's Chart, lab work & pertinent test results  Airway Mallampati: II  TM Distance: >3 FB Neck ROM: Full    Dental no notable dental hx.    Pulmonary sleep apnea ,    Pulmonary exam normal breath sounds clear to auscultation       Cardiovascular negative cardio ROS Normal cardiovascular exam Rhythm:Regular Rate:Normal     Neuro/Psych negative neurological ROS  negative psych ROS   GI/Hepatic negative GI ROS, Neg liver ROS,   Endo/Other  negative endocrine ROS  Renal/GU negative Renal ROS  negative genitourinary   Musculoskeletal negative musculoskeletal ROS (+)   Abdominal (+) + obese,   Peds negative pediatric ROS (+)  Hematology negative hematology ROS (+)   Anesthesia Other Findings Breast Cancer  Reproductive/Obstetrics negative OB ROS                             Anesthesia Physical Anesthesia Plan  ASA: III  Anesthesia Plan: General   Post-op Pain Management:  Regional for Post-op pain   Induction: Intravenous  PONV Risk Score and Plan: 3 and Ondansetron, Dexamethasone, Midazolam and Treatment may vary due to age or medical condition  Airway Management Planned: LMA  Additional Equipment:   Intra-op Plan:   Post-operative Plan: Extubation in OR  Informed Consent: I have reviewed the patients History and Physical, chart, labs and discussed the procedure including the risks, benefits and alternatives for the proposed anesthesia with the patient or authorized representative who has indicated his/her understanding and acceptance.     Dental advisory given  Plan Discussed with: CRNA  Anesthesia Plan Comments:         Anesthesia Quick Evaluation

## 2020-04-16 NOTE — Progress Notes (Signed)
Assisted Dr. Miller with left, ultrasound guided, pectoralis block. Side rails up, monitors on throughout procedure. See vital signs in flow sheet. Tolerated Procedure well. 

## 2020-04-17 ENCOUNTER — Encounter (HOSPITAL_BASED_OUTPATIENT_CLINIC_OR_DEPARTMENT_OTHER): Payer: Self-pay | Admitting: Surgery

## 2020-04-20 LAB — SURGICAL PATHOLOGY

## 2020-04-22 ENCOUNTER — Encounter: Payer: Self-pay | Admitting: *Deleted

## 2020-04-29 NOTE — Progress Notes (Signed)
Location of Breast Cancer:bilateral breasts  Histology per Pathology Report: bilateral breast 04/16/2020 - Dr Ninfa Linden    Receptor Status:  04/16/2020     Did patient present with symptoms (if so, please note symptoms) or was this found on screening mammography?:  09/13/2019 routine mammography with a palpable left breast lump.  Past/Anticipated interventions by surgeon, if any:  04/16/2020 - Dr Ninfa Linden   Past/Anticipated interventions by medical oncology, if any:  Dr Jana Hakim   Lymphedema issues, if any:  No swelling but tenderness to left axilla.    Pain issues, if any:  no   SAFETY ISSUES:  Prior radiation? no  Pacemaker/ICD? no  Possible current pregnancy?no  Is the patient on methotrexate? no  Current Complaints / other details:  None at this time.   Vitals:   05/06/20 0843  BP: 132/75  Pulse: 63  Resp: 18  Temp: (!) 96.8 F (36 C)  SpO2: 100%  Weight: 204 lb 3.2 oz (92.6 kg)  Height: 5' 3.5" (1.613 m)

## 2020-05-06 ENCOUNTER — Other Ambulatory Visit: Payer: Self-pay

## 2020-05-06 ENCOUNTER — Ambulatory Visit: Payer: Medicare HMO | Admitting: Radiation Oncology

## 2020-05-06 ENCOUNTER — Ambulatory Visit
Admission: RE | Admit: 2020-05-06 | Discharge: 2020-05-06 | Disposition: A | Discharge status: Home / Self Care | Payer: Medicare HMO | Source: Ambulatory Visit | Attending: Radiation Oncology | Admitting: Radiation Oncology

## 2020-05-06 ENCOUNTER — Encounter: Payer: Self-pay | Admitting: Radiation Oncology

## 2020-05-06 VITALS — BP 132/75 | HR 63 | Temp 96.8°F | Resp 18 | Ht 63.5 in | Wt 204.2 lb

## 2020-05-06 DIAGNOSIS — Z170 Estrogen receptor positive status [ER+]: Secondary | ICD-10-CM | POA: Insufficient documentation

## 2020-05-06 DIAGNOSIS — Z171 Estrogen receptor negative status [ER-]: Secondary | ICD-10-CM | POA: Insufficient documentation

## 2020-05-06 DIAGNOSIS — C50412 Malignant neoplasm of upper-outer quadrant of left female breast: Secondary | ICD-10-CM

## 2020-05-06 DIAGNOSIS — E559 Vitamin D deficiency, unspecified: Secondary | ICD-10-CM | POA: Diagnosis not present

## 2020-05-06 DIAGNOSIS — M1711 Unilateral primary osteoarthritis, right knee: Secondary | ICD-10-CM | POA: Diagnosis not present

## 2020-05-06 DIAGNOSIS — D0511 Intraductal carcinoma in situ of right breast: Secondary | ICD-10-CM | POA: Diagnosis not present

## 2020-05-06 DIAGNOSIS — E669 Obesity, unspecified: Secondary | ICD-10-CM | POA: Diagnosis not present

## 2020-05-06 DIAGNOSIS — D0510 Intraductal carcinoma in situ of unspecified breast: Secondary | ICD-10-CM | POA: Diagnosis not present

## 2020-05-06 DIAGNOSIS — R7303 Prediabetes: Secondary | ICD-10-CM | POA: Diagnosis not present

## 2020-05-06 DIAGNOSIS — E782 Mixed hyperlipidemia: Secondary | ICD-10-CM | POA: Diagnosis not present

## 2020-05-06 DIAGNOSIS — G4733 Obstructive sleep apnea (adult) (pediatric): Secondary | ICD-10-CM | POA: Diagnosis not present

## 2020-05-06 DIAGNOSIS — N182 Chronic kidney disease, stage 2 (mild): Secondary | ICD-10-CM | POA: Diagnosis not present

## 2020-05-06 NOTE — Progress Notes (Signed)
See MD note for nursing evaluation. °

## 2020-05-06 NOTE — Progress Notes (Addendum)
Radiation Oncology         [phone] ________________________________  Name: Cheyenne Mcdonald MRN: [id number]  Date: 05/06/2020  DOB: December 04, 1950  Re-Evaluation Note  CC: Trey Sailors, PA  Benito Mccreedy, MD    ICD-10-CM   1. Malignant neoplasm of upper-outer quadrant of left breast in female, estrogen receptor negative ([place])  C50.412    Z17.1   2. Ductal carcinoma in situ (DCIS) of right breast  D05.11     Diagnosis: Stage T1c, N0, M0, Left Breast UOQ, Invasive Ductal Carcinoma, ER- / PR- / Her2-, Grade 3  Stage 0 Right Breast, DCIS, ER+ / PR+, Grade 1, close surgical margins  Narrative:  The patient returns today to discuss radiation treatment options. She was seen in the multidisciplinary breast clinic on 10/09/2019, at which time it was recommended that she proceed with genetic testing, breast MRI, neoadjuvant chemotherapy, left lumpectomy with sentinel lymph node biopsy, and adjuvant radiation therapy.  Since consultation, she underwent genetic testing on 10/09/2019. Results were negative.  MRI of bilateral breasts on 10/21/2019 showed two irregular masses in the upper outer left breast that represented the patient's known malignancy. There was also noted to be surrounding heterogeneous non-mass enhancement primarily located superior to those masses and spanned 5.4 cm in greatest dimension, also suspicious for malignancy. There was no evidence of malignancy in the right breast. Given these findings, an additional biopsy was performed of the left breast enhancement on 11/01/2019 and was benign.  The patient underwent neoadjuvant chemotherapy with Doxorubicin and Cyclophosphamide x4 from 10/22/2019 to 12/03/2019 under the care of Dr. Jana Hakim. This was followed by weekly Carboplatin and Paclitaxel x12 from 12/17/2019 to 02/25/2020.  MRI of bilateral breasts on 02/24/2020 showed two sites of new suspicious linear non-mass enhancement within the lower central and lower right  breast, measuring 3.2 cm and 1.9 cm. There was noted to be significant interval decrease of the mass and non-mass enhancement within the left breast, corresponding to the sites of the known biopsy-proven carcinoma and the additional third biopsy site that was benign. Findings indicated a good response to interval therapy.  Biopsy of the right breast was performed on 02/27/2020 and revealed low-grade DCIS. ER status: 100% positive; PR status: 100% positive, both with strong staining intensities.  She opted to proceed with bilateral breast lumpectomies and left deep axillary sentinel lymph node biopsy on 04/16/2020 under the care of Dr. Ninfa Linden. Pathology from the procedure revealed low-grade DCIS of the right breast that was focally < 1 mm from each posterior and medial margins. Left breast showed no residual invasive carcinoma. Three left axillary sentinel lymph nodes were biopsied, all of which were negative for carcinoma.  On review of systems, the patient reports having some discomfort within the axillary region and limited abduction of her arm. She denies obvious swelling of her left arm and any other symptoms.    Allergies:  is allergic to other and shellfish allergy.  Meds: Current Outpatient Medications  Medication Sig Dispense Refill  . Cholecalciferol (VITAMIN D-3) 125 MCG (5000 UT) TABS 1 cap(s)    . oxyCODONE (OXY IR/ROXICODONE) 5 MG immediate release tablet Take 1 tablet (5 mg total) by mouth every 6 (six) hours as needed for moderate pain or severe pain. (Patient not taking: Reported on 05/06/2020) 25 tablet 0   No current facility-administered medications for this encounter.    Physical Findings: The patient is in no acute distress. Patient is alert and oriented.  Accompanied by husband on evaluation  today  height is 5' 3.5" (1.613 m) and weight is 204 lb 3.2 oz (92.6 kg). Her temperature is 96.8 F (36 C) (abnormal). Her blood pressure is 132/75 and her pulse is 63. Her  respiration is 18 and oxygen saturation is 100%.   Lungs are clear to auscultation bilaterally. Heart has regular rate and rhythm. No palpable cervical, supraclavicular, or axillary adenopathy. Abdomen soft, non-tender, normal bowel sounds. Right breast: Well-healing lumpectomy scar without signs of drainage or infection Left breast: Well-healing scar in the upper outer quadrant of the breast which encompasses both the lumpectomy site and sentinel node procedure.  No signs of drainage or infection.  Patient appears to have a rather large palpable seroma in the upper outer quadrant of the left breast.  Lab Findings: Lab Results  Component Value Date   WBC 2.4 (L) 03/03/2020   HGB 10.5 (L) 03/03/2020   HCT 29.4 (L) 03/03/2020   MCV 97.4 03/03/2020   PLT 144 (L) 03/03/2020    Radiographic Findings: NM Sentinel Node Inj-No Rpt (Breast)  Result Date: 04/16/2020 Sulfur colloid was injected by the nuclear medicine technologist for melanoma sentinel node.   MM Breast Surgical Specimen  Result Date: 04/16/2020 CLINICAL DATA:  Specimen radiograph status post left breast lumpectomy. EXAM: SPECIMEN RADIOGRAPH OF THE LEFT BREAST COMPARISON:  Previous exam(s). FINDINGS: Status post excision of the left breast. The 3 radioactive seed and 3 biopsy marker clips are present present and completely intact. These findings were communicated with the OR at 8:54 a.m. IMPRESSION: Specimen radiograph of the left breast. Electronically Signed   By: Ammie Ferrier M.D.   On: 04/16/2020 08:54   MM Breast Surgical Specimen  Result Date: 04/16/2020 CLINICAL DATA:  Specimen radiograph status post right breast lumpectomy. EXAM: SPECIMEN RADIOGRAPH OF THE RIGHT BREAST COMPARISON:  Previous exam(s). FINDINGS: Status post excision of the right breast. The radioactive seeds and biopsy marker clips are present, completely intact, and were marked for pathology. These findings were communicated with the OR. IMPRESSION: Specimen  radiograph of the right breast. Electronically Signed   By: Ammie Ferrier M.D.   On: 04/16/2020 08:53   MM LT RADIOACTIVE SEED LOC MAMMO GUIDE  Result Date: 04/15/2020 CLINICAL DATA:  Patient with bilateral breast cancers (4 sites) and 1 additional benign biopsy site deemed discordant by the radiologist performed the biopsy. Patient is scheduled tomorrow for bilateral lumpectomies to include all 5 sites. Patient presents today for preoperative radioactive seed localization for all 5 sites. EXAM: MAMMOGRAPHIC GUIDED RADIOACTIVE SEED LOCALIZATION OF THE BILATERAL BREAST x5 COMPARISON:  Previous exam(s). FINDINGS: Patient presents for radioactive seed localization prior to bilateral lumpectomies (5 biopsy sites). I met with the patient and we discussed the procedure of seed localization including benefits and alternatives. We discussed the high likelihood of a successful procedure. We discussed the risks of the procedure including infection, bleeding, tissue injury and further surgery. We discussed the low dose of radioactivity involved in the procedure. Informed, written consent was given. The usual time-out protocol was performed immediately prior to the procedure. Site 1: Using mammographic guidance, sterile technique, 1% lidocaine and an I-125 radioactive seed, cylinder shaped clip within the outer RIGHT breast was localized using a lateral approach. The follow-up mammogram images confirm the seed in the expected location and were marked for Dr. Ninfa Linden. Follow-up survey of the patient confirms presence of the radioactive seed. Order number of I-125 seed:  [id number]. Total activity:  4.696 millicurie reference Date: 03/11/2020 Site 2: Using mammographic guidance,  sterile technique, 1% lidocaine and an I-125 radioactive seed, the barbell shaped clip within the outer RIGHT breast was localized using a lateral approach. The follow-up mammogram images confirm the seed in the expected location and were marked  for Dr. Ninfa Linden. Follow-up survey of the patient confirms presence of the radioactive seed. Order number of I-125 seed:  [id number]. Total activity:  5.916 millicuries reference Date: 04/03/2020 Site 3: Using mammographic guidance, sterile technique, 1% lidocaine and an I-125 radioactive seed, the cylinder shaped clip within the upper LEFT breast was localized using a superior approach. The follow-up mammogram images confirm the seed in the expected location and were marked for Dr. Ninfa Linden. Follow-up survey of the patient confirms presence of the radioactive seed. Order number of I-125 seed:  [id number]. Total activity:  3.570 millicuries reference Date: 03/27/2020 Site 4: Using mammographic guidance, sterile technique, 1% lidocaine and an I-125 radioactive seed, the ribbon shaped clip within the upper outer quadrant of the LEFT breast was localized using a superior approach. The follow-up mammogram images confirm the seed in the expected location and were marked for Dr. Ninfa Linden. Follow-up survey of the patient confirms presence of the radioactive seed. Order number of I-125 seed:  [id number]. Total activity:  0.923 millicuries reference Date: 04/03/2020 Site 5: Using mammographic guidance, sterile technique, 1% lidocaine and an I-125 radioactive seed, the coil shaped clip within the upper-outer quadrant of the LEFT breast was localized using a superior approach. The follow-up mammogram images confirm the seed in the expected location and were marked for Dr. Ninfa Linden. Follow-up survey of the patient confirms presence of the radioactive seed. Order number of I-125 seed:  [id number]. Total activity:  3.354 millicuries reference Date: 04/03/2020 The patient tolerated the procedure well and was released from the [facility]. She was given instructions regarding seed removal. IMPRESSION: 1. Radioactive seed localization RIGHT breast. No apparent complications. 2. Radioactive seed localization RIGHT breast. No apparent  complications. 3. Radioactive seed localization LEFT breast. No apparent complications. 4. Radioactive seed localization LEFT breast. No apparent complications. 5. Radioactive seed localization LEFT breast. No apparent complications. Electronically Signed   By: Franki Cabot M.D.   On: 04/15/2020 14:33   MM LT RAD SEED EA ADD LESION LOC MAMMO  Result Date: 04/15/2020 CLINICAL DATA:  Patient with bilateral breast cancers (4 sites) and 1 additional benign biopsy site deemed discordant by the radiologist performed the biopsy. Patient is scheduled tomorrow for bilateral lumpectomies to include all 5 sites. Patient presents today for preoperative radioactive seed localization for all 5 sites. EXAM: MAMMOGRAPHIC GUIDED RADIOACTIVE SEED LOCALIZATION OF THE BILATERAL BREAST x5 COMPARISON:  Previous exam(s). FINDINGS: Patient presents for radioactive seed localization prior to bilateral lumpectomies (5 biopsy sites). I met with the patient and we discussed the procedure of seed localization including benefits and alternatives. We discussed the high likelihood of a successful procedure. We discussed the risks of the procedure including infection, bleeding, tissue injury and further surgery. We discussed the low dose of radioactivity involved in the procedure. Informed, written consent was given. The usual time-out protocol was performed immediately prior to the procedure. Site 1: Using mammographic guidance, sterile technique, 1% lidocaine and an I-125 radioactive seed, cylinder shaped clip within the outer RIGHT breast was localized using a lateral approach. The follow-up mammogram images confirm the seed in the expected location and were marked for Dr. Ninfa Linden. Follow-up survey of the patient confirms presence of the radioactive seed. Order number of I-125 seed:  [id number]. Total activity:  7.342 millicurie  reference Date: 03/11/2020 Site 2: Using mammographic guidance, sterile technique, 1% lidocaine and an I-125  radioactive seed, the barbell shaped clip within the outer RIGHT breast was localized using a lateral approach. The follow-up mammogram images confirm the seed in the expected location and were marked for Dr. Ninfa Linden. Follow-up survey of the patient confirms presence of the radioactive seed. Order number of I-125 seed:  [id number]. Total activity:  4.742 millicuries reference Date: 04/03/2020 Site 3: Using mammographic guidance, sterile technique, 1% lidocaine and an I-125 radioactive seed, the cylinder shaped clip within the upper LEFT breast was localized using a superior approach. The follow-up mammogram images confirm the seed in the expected location and were marked for Dr. Ninfa Linden. Follow-up survey of the patient confirms presence of the radioactive seed. Order number of I-125 seed:  [id number]. Total activity:  6.433 millicuries reference Date: 03/27/2020 Site 4: Using mammographic guidance, sterile technique, 1% lidocaine and an I-125 radioactive seed, the ribbon shaped clip within the upper outer quadrant of the LEFT breast was localized using a superior approach. The follow-up mammogram images confirm the seed in the expected location and were marked for Dr. Ninfa Linden. Follow-up survey of the patient confirms presence of the radioactive seed. Order number of I-125 seed:  [id number]. Total activity:  6.063 millicuries reference Date: 04/03/2020 Site 5: Using mammographic guidance, sterile technique, 1% lidocaine and an I-125 radioactive seed, the coil shaped clip within the upper-outer quadrant of the LEFT breast was localized using a superior approach. The follow-up mammogram images confirm the seed in the expected location and were marked for Dr. Ninfa Linden. Follow-up survey of the patient confirms presence of the radioactive seed. Order number of I-125 seed:  [id number]. Total activity:  3.557 millicuries reference Date: 04/03/2020 The patient tolerated the procedure well and was released from the [facility]. She was given instructions regarding seed removal. IMPRESSION: 1. Radioactive seed localization RIGHT breast. No apparent complications. 2. Radioactive seed localization RIGHT breast. No apparent complications. 3. Radioactive seed localization LEFT breast. No apparent complications. 4. Radioactive seed localization LEFT breast. No apparent complications. 5. Radioactive seed localization LEFT breast. No apparent complications. Electronically Signed   By: Franki Cabot M.D.   On: 04/15/2020 14:33   MM LT RAD SEED EA ADD LESION LOC MAMMO  Result Date: 04/15/2020 CLINICAL DATA:  Patient with bilateral breast cancers (4 sites) and 1 additional benign biopsy site deemed discordant by the radiologist performed the biopsy. Patient is scheduled tomorrow for bilateral lumpectomies to include all 5 sites. Patient presents today for preoperative radioactive seed localization for all 5 sites. EXAM: MAMMOGRAPHIC GUIDED RADIOACTIVE SEED LOCALIZATION OF THE BILATERAL BREAST x5 COMPARISON:  Previous exam(s). FINDINGS: Patient presents for radioactive seed localization prior to bilateral lumpectomies (5 biopsy sites). I met with the patient and we discussed the procedure of seed localization including benefits and alternatives. We discussed the high likelihood of a successful procedure. We discussed the risks of the procedure including infection, bleeding, tissue injury and further surgery. We discussed the low dose of radioactivity involved in the procedure. Informed, written consent was given. The usual time-out protocol was performed immediately prior to the procedure. Site 1: Using mammographic guidance, sterile technique, 1% lidocaine and an I-125 radioactive seed, cylinder shaped clip within the outer RIGHT breast was localized using a lateral approach. The follow-up mammogram images confirm the seed in the expected location and were marked for Dr. Ninfa Linden. Follow-up survey of the patient confirms presence of the  radioactive seed. Order number of  I-125 seed:  [id number]. Total activity:  3.875 millicurie reference Date: 03/11/2020 Site 2: Using mammographic guidance, sterile technique, 1% lidocaine and an I-125 radioactive seed, the barbell shaped clip within the outer RIGHT breast was localized using a lateral approach. The follow-up mammogram images confirm the seed in the expected location and were marked for Dr. Ninfa Linden. Follow-up survey of the patient confirms presence of the radioactive seed. Order number of I-125 seed:  [id number]. Total activity:  8.416 millicuries reference Date: 04/03/2020 Site 3: Using mammographic guidance, sterile technique, 1% lidocaine and an I-125 radioactive seed, the cylinder shaped clip within the upper LEFT breast was localized using a superior approach. The follow-up mammogram images confirm the seed in the expected location and were marked for Dr. Ninfa Linden. Follow-up survey of the patient confirms presence of the radioactive seed. Order number of I-125 seed:  [id number]. Total activity:  1.093 millicuries reference Date: 03/27/2020 Site 4: Using mammographic guidance, sterile technique, 1% lidocaine and an I-125 radioactive seed, the ribbon shaped clip within the upper outer quadrant of the LEFT breast was localized using a superior approach. The follow-up mammogram images confirm the seed in the expected location and were marked for Dr. Ninfa Linden. Follow-up survey of the patient confirms presence of the radioactive seed. Order number of I-125 seed:  [id number]. Total activity:  2.542 millicuries reference Date: 04/03/2020 Site 5: Using mammographic guidance, sterile technique, 1% lidocaine and an I-125 radioactive seed, the coil shaped clip within the upper-outer quadrant of the LEFT breast was localized using a superior approach. The follow-up mammogram images confirm the seed in the expected location and were marked for Dr. Ninfa Linden. Follow-up survey of the patient confirms presence of  the radioactive seed. Order number of I-125 seed:  [id number]. Total activity:  3.151 millicuries reference Date: 04/03/2020 The patient tolerated the procedure well and was released from the [facility]. She was given instructions regarding seed removal. IMPRESSION: 1. Radioactive seed localization RIGHT breast. No apparent complications. 2. Radioactive seed localization RIGHT breast. No apparent complications. 3. Radioactive seed localization LEFT breast. No apparent complications. 4. Radioactive seed localization LEFT breast. No apparent complications. 5. Radioactive seed localization LEFT breast. No apparent complications. Electronically Signed   By: Franki Cabot M.D.   On: 04/15/2020 14:33   MM RT RADIOACTIVE SEED LOC MAMMO GUIDE  Result Date: 04/15/2020 CLINICAL DATA:  Patient with bilateral breast cancers (4 sites) and 1 additional benign biopsy site deemed discordant by the radiologist performed the biopsy. Patient is scheduled tomorrow for bilateral lumpectomies to include all 5 sites. Patient presents today for preoperative radioactive seed localization for all 5 sites. EXAM: MAMMOGRAPHIC GUIDED RADIOACTIVE SEED LOCALIZATION OF THE BILATERAL BREAST x5 COMPARISON:  Previous exam(s). FINDINGS: Patient presents for radioactive seed localization prior to bilateral lumpectomies (5 biopsy sites). I met with the patient and we discussed the procedure of seed localization including benefits and alternatives. We discussed the high likelihood of a successful procedure. We discussed the risks of the procedure including infection, bleeding, tissue injury and further surgery. We discussed the low dose of radioactivity involved in the procedure. Informed, written consent was given. The usual time-out protocol was performed immediately prior to the procedure. Site 1: Using mammographic guidance, sterile technique, 1% lidocaine and an I-125 radioactive seed, cylinder shaped clip within the outer RIGHT breast was  localized using a lateral approach. The follow-up mammogram images confirm the seed in the expected location and were marked for Dr. Ninfa Linden. Follow-up survey of the patient confirms presence of  the radioactive seed. Order number of I-125 seed:  [id number]. Total activity:  4.967 millicurie reference Date: 03/11/2020 Site 2: Using mammographic guidance, sterile technique, 1% lidocaine and an I-125 radioactive seed, the barbell shaped clip within the outer RIGHT breast was localized using a lateral approach. The follow-up mammogram images confirm the seed in the expected location and were marked for Dr. Ninfa Linden. Follow-up survey of the patient confirms presence of the radioactive seed. Order number of I-125 seed:  [id number]. Total activity:  5.993 millicuries reference Date: 04/03/2020 Site 3: Using mammographic guidance, sterile technique, 1% lidocaine and an I-125 radioactive seed, the cylinder shaped clip within the upper LEFT breast was localized using a superior approach. The follow-up mammogram images confirm the seed in the expected location and were marked for Dr. Ninfa Linden. Follow-up survey of the patient confirms presence of the radioactive seed. Order number of I-125 seed:  [id number]. Total activity:  9.030 millicuries reference Date: 03/27/2020 Site 4: Using mammographic guidance, sterile technique, 1% lidocaine and an I-125 radioactive seed, the ribbon shaped clip within the upper outer quadrant of the LEFT breast was localized using a superior approach. The follow-up mammogram images confirm the seed in the expected location and were marked for Dr. Ninfa Linden. Follow-up survey of the patient confirms presence of the radioactive seed. Order number of I-125 seed:  [id number]. Total activity:  2.263 millicuries reference Date: 04/03/2020 Site 5: Using mammographic guidance, sterile technique, 1% lidocaine and an I-125 radioactive seed, the coil shaped clip within the upper-outer quadrant of the LEFT breast was  localized using a superior approach. The follow-up mammogram images confirm the seed in the expected location and were marked for Dr. Ninfa Linden. Follow-up survey of the patient confirms presence of the radioactive seed. Order number of I-125 seed:  [id number]. Total activity:  3.893 millicuries reference Date: 04/03/2020 The patient tolerated the procedure well and was released from the [facility]. She was given instructions regarding seed removal. IMPRESSION: 1. Radioactive seed localization RIGHT breast. No apparent complications. 2. Radioactive seed localization RIGHT breast. No apparent complications. 3. Radioactive seed localization LEFT breast. No apparent complications. 4. Radioactive seed localization LEFT breast. No apparent complications. 5. Radioactive seed localization LEFT breast. No apparent complications. Electronically Signed   By: Franki Cabot M.D.   On: 04/15/2020 14:33   MM RT RADIO SEED EA ADD LESION LOC MAMMO  Result Date: 04/15/2020 CLINICAL DATA:  Patient with bilateral breast cancers (4 sites) and 1 additional benign biopsy site deemed discordant by the radiologist performed the biopsy. Patient is scheduled tomorrow for bilateral lumpectomies to include all 5 sites. Patient presents today for preoperative radioactive seed localization for all 5 sites. EXAM: MAMMOGRAPHIC GUIDED RADIOACTIVE SEED LOCALIZATION OF THE BILATERAL BREAST x5 COMPARISON:  Previous exam(s). FINDINGS: Patient presents for radioactive seed localization prior to bilateral lumpectomies (5 biopsy sites). I met with the patient and we discussed the procedure of seed localization including benefits and alternatives. We discussed the high likelihood of a successful procedure. We discussed the risks of the procedure including infection, bleeding, tissue injury and further surgery. We discussed the low dose of radioactivity involved in the procedure. Informed, written consent was given. The usual time-out protocol was  performed immediately prior to the procedure. Site 1: Using mammographic guidance, sterile technique, 1% lidocaine and an I-125 radioactive seed, cylinder shaped clip within the outer RIGHT breast was localized using a lateral approach. The follow-up mammogram images confirm the seed in the expected location and were marked for Dr. Ninfa Linden.  Follow-up survey of the patient confirms presence of the radioactive seed. Order number of I-125 seed:  [id number]. Total activity:  8.315 millicurie reference Date: 03/11/2020 Site 2: Using mammographic guidance, sterile technique, 1% lidocaine and an I-125 radioactive seed, the barbell shaped clip within the outer RIGHT breast was localized using a lateral approach. The follow-up mammogram images confirm the seed in the expected location and were marked for Dr. Ninfa Linden. Follow-up survey of the patient confirms presence of the radioactive seed. Order number of I-125 seed:  [id number]. Total activity:  1.062 millicuries reference Date: 04/03/2020 Site 3: Using mammographic guidance, sterile technique, 1% lidocaine and an I-125 radioactive seed, the cylinder shaped clip within the upper LEFT breast was localized using a superior approach. The follow-up mammogram images confirm the seed in the expected location and were marked for Dr. Ninfa Linden. Follow-up survey of the patient confirms presence of the radioactive seed. Order number of I-125 seed:  [id number]. Total activity:  7.035 millicuries reference Date: 03/27/2020 Site 4: Using mammographic guidance, sterile technique, 1% lidocaine and an I-125 radioactive seed, the ribbon shaped clip within the upper outer quadrant of the LEFT breast was localized using a superior approach. The follow-up mammogram images confirm the seed in the expected location and were marked for Dr. Ninfa Linden. Follow-up survey of the patient confirms presence of the radioactive seed. Order number of I-125 seed:  [id number]. Total activity:  9.371 millicuries  reference Date: 04/03/2020 Site 5: Using mammographic guidance, sterile technique, 1% lidocaine and an I-125 radioactive seed, the coil shaped clip within the upper-outer quadrant of the LEFT breast was localized using a superior approach. The follow-up mammogram images confirm the seed in the expected location and were marked for Dr. Ninfa Linden. Follow-up survey of the patient confirms presence of the radioactive seed. Order number of I-125 seed:  [id number]. Total activity:  0.175 millicuries reference Date: 04/03/2020 The patient tolerated the procedure well and was released from the [facility]. She was given instructions regarding seed removal. IMPRESSION: 1. Radioactive seed localization RIGHT breast. No apparent complications. 2. Radioactive seed localization RIGHT breast. No apparent complications. 3. Radioactive seed localization LEFT breast. No apparent complications. 4. Radioactive seed localization LEFT breast. No apparent complications. 5. Radioactive seed localization LEFT breast. No apparent complications. Electronically Signed   By: Franki Cabot M.D.   On: 04/15/2020 14:33    Impression: Stage T1c, N0, M0, (ypT0, ypN0 Left Breast UOQ, Invasive Ductal Carcinoma, ER- / PR- / Her2-, Grade 3  Stage 0 Right Breast, DCIS, ER+ / PR+, Grade 1, close surgical margins  The patient has had an excellent response to her neoadjuvant chemotherapy with no residual malignancy recovered in the left breast.  She did not have any involvement of the left axillary area prior to or after her neoadjuvant chemotherapy.  She would be a good candidate for breast conservation along the left side with radiation therapy as a component.  Since her disease did not occupy the axillary region she would be a candidate for left breast radiation therapy alone.  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  This was completely resected however the posterior and medial margins were  close at less than 1 mm.  Given these close surgical margins I would recommend adjuvant radiation therapy along the right breast.  I discussed the general course of treatment side effects and potential toxicities of radiation therapy in this situation with the patient and her husband.  She appears  to understand and wishes to proceed with radiation therapy as recommended.  The patient was originally scheduled for CT simulation today but given that it is only 3 weeks out from her surgery and she has difficulty with placing her left arm in the treatment position we will postpone this to May 10.  Plan: The patient is scheduled for CT simulation on May 10 with treatments to begin approximately a week later.  Anticipate 6 weeks of radiation therapy to both the left and right breast area.  Would not recommend hypofractionated accelerated radiation therapy given the potential for overlap along the sternal area.  Patient will see Dr. Ninfa Linden on May 9th for postop visit.  Total time spent in this encounter was 35 minutes which included reviewing the patient's most recent genetic testing, breast MRIs, neoadjuvant chemotherapy, biopsies, surgery, pathology reports, follow-ups, physical examination, and documentation.  -----------------------------------  Blair Promise, PhD, MD  This document serves as a record of services personally performed by Gery Pray, MD. It was created on his behalf by Clerance Lav, a trained medical scribe. The creation of this record is based on the scribe's personal observations and the provider's statements to them. This document has been checked and approved by the attending provider.

## 2020-05-11 ENCOUNTER — Other Ambulatory Visit: Payer: Self-pay

## 2020-05-11 ENCOUNTER — Ambulatory Visit: Payer: Medicare HMO | Attending: Surgery | Admitting: Physical Therapy

## 2020-05-11 ENCOUNTER — Encounter: Payer: Self-pay | Admitting: *Deleted

## 2020-05-11 ENCOUNTER — Encounter: Payer: Self-pay | Admitting: Physical Therapy

## 2020-05-11 DIAGNOSIS — Z483 Aftercare following surgery for neoplasm: Secondary | ICD-10-CM | POA: Diagnosis not present

## 2020-05-11 DIAGNOSIS — Z171 Estrogen receptor negative status [ER-]: Secondary | ICD-10-CM | POA: Insufficient documentation

## 2020-05-11 DIAGNOSIS — M25612 Stiffness of left shoulder, not elsewhere classified: Secondary | ICD-10-CM | POA: Diagnosis not present

## 2020-05-11 DIAGNOSIS — R293 Abnormal posture: Secondary | ICD-10-CM | POA: Diagnosis not present

## 2020-05-11 DIAGNOSIS — C50412 Malignant neoplasm of upper-outer quadrant of left female breast: Secondary | ICD-10-CM

## 2020-05-11 DIAGNOSIS — R600 Localized edema: Secondary | ICD-10-CM

## 2020-05-11 NOTE — Therapy (Signed)
[place], [place], [address] Phone: [phone]   Fax:  [phone]  Physical Therapy Treatment  Patient Details  Name: Cheyenne Mcdonald MRN: [id number] Date of Birth: 1950-07-03 Referring Provider (PT): Dr. [name]   Encounter Date: 05/11/2020   PT End of Session - 05/11/20 1500    Visit Number 2    Number of Visits 10    Date for PT Re-Evaluation 06/08/20    PT Start Time 0623    PT Stop Time 1500    PT Time Calculation (min) 57 min    Activity Tolerance Patient tolerated treatment well    Behavior During Therapy [facility] for tasks assessed/performed           Past Medical History:  Diagnosis Date  . Breast cancer ([place])   . Obesity   . Sleep apnea     Past Surgical History:  Procedure Laterality Date  . ABDOMINAL HYSTERECTOMY    . BREAST LUMPECTOMY WITH RADIOACTIVE SEED AND SENTINEL LYMPH NODE BIOPSY Left 04/16/2020   Procedure: LEFT BREAST LUMPECTOMY WITH RADIOACTIVE SEED AND LEFT AXILLARY SENTINEL LYMPH NODE BIOPSY;  Surgeon: [name], MD;  Location: [facility];  Service: General;  Laterality: Left;  . BREAST LUMPECTOMY WITH RADIOACTIVE SEED LOCALIZATION Right 04/16/2020   Procedure: RIGHT BREAST LUMPECTOMY WITH RADIOACTIVE SEED LOCALIZATION;  Surgeon: [name], MD;  Location: [facility];  Service: General;  Laterality: Right;  . IR IMAGING GUIDED PORT INSERTION  10/18/2019    There were no vitals filed for this visit.   Subjective Assessment - 05/11/20 1403    Subjective Patient reports on 04/16/2020 she underwent a right lumpectomy and left lumpectomy with sentinel node biopsy on the left side with 0/2 positive. She will have radiation for 34 treatments. Initially she underwent 18 weeks of neoadjuvant chemotherapy. She reports she has a large seroma and fluid continues to drain from her left breast. She will see the PA at 3:30 after this  appointment.    Pertinent History Patient was diagnosed on 09/30/2019 with left grade III triple negative invasive ductal carcinoma breast cancer. It measures 9 mm and 1.4 cm both located in the upper outer quadrant. Ki67 is 90%.    Patient Stated Goals See if my arm is back to normal    Currently in Pain? Yes    Pain Score 4     Pain Location Breast    Pain Orientation Left    Pain Descriptors / Indicators Tender    Pain Type Surgical pain    Pain Onset 1 to 4 weeks ago    Pain Frequency Intermittent    Aggravating Factors  Touching the breast    Pain Relieving Factors Rest              [facility] PT Assessment - 05/11/20 0001      Assessment   Medical Diagnosis Bil lump and left SLNB    Referring Provider (PT) Dr. [name]    Onset Date/Surgical Date 04/16/20    Hand Dominance Right    Prior Therapy Baselines      Precautions   Precautions Other (comment)    Precaution Comments recent surgery      Restrictions   Weight Bearing Restrictions No      Balance Screen   Has the patient fallen in the past 6 months No    Has the patient had a decrease in activity level because of a fear  of falling?  No    Is the patient reluctant to leave their home because of a fear of falling?  No      Home Environment   Living Environment Private residence    Living Arrangements Spouse/significant other    Available Help at Discharge Family      Prior Function   Level of Independence Independent    Vocation Retired    Leisure She is walking 2 days per week and walks about 30 minutes      Cognition   Overall Cognitive Status Within Functional Limits for tasks assessed      Observation/Other Assessments   Observations Bilateral breast lumpectomy incisions are healing well. She has a large seroma on her left breast that is draining yellowish orange clear fluid. Mild redness present at seroma site. Covered area with pad due to drainage.      Posture/Postural Control    Posture/Postural Control Postural limitations    Postural Limitations Rounded Shoulders;Forward head      ROM / Strength   AROM / PROM / Strength AROM      AROM   AROM Assessment Site Shoulder    Right/Left Shoulder Right;Left    Right Shoulder Extension 55 Degrees    Right Shoulder Flexion 140 Degrees    Right Shoulder ABduction 153 Degrees    Right Shoulder Internal Rotation 56 Degrees    Right Shoulder External Rotation 84 Degrees    Left Shoulder Extension 42 Degrees    Left Shoulder Flexion 121 Degrees    Left Shoulder ABduction 130 Degrees    Left Shoulder Internal Rotation 67 Degrees    Left Shoulder External Rotation 78 Degrees      Strength   Overall Strength Within functional limits for tasks performed             LYMPHEDEMA/ONCOLOGY QUESTIONNAIRE - 05/11/20 0001      Type   Cancer Type Left breast cancer      Surgeries   Lumpectomy Date 04/16/20    Sentinel Lymph Node Biopsy Date 04/16/20    Number Lymph Nodes Removed 2      Treatment   Active Chemotherapy Treatment No    Past Chemotherapy Treatment Yes    Date 02/25/20    Active Radiation Treatment No    Past Radiation Treatment No    Current Hormone Treatment No    Past Hormone Therapy No      What other symptoms do you have   Are you Having Heaviness or Tightness No    Are you having Pain Yes    Are you having pitting edema No    Is it Hard or Difficult finding clothes that fit No    Do you have infections No    Is there Decreased scar mobility Yes    Stemmer Sign No      Lymphedema Assessments   Lymphedema Assessments Upper extremities      Right Upper Extremity Lymphedema   10 cm Proximal to Olecranon Process 34 cm    Olecranon Process 27.4 cm    10 cm Proximal to Ulnar Styloid Process 22.4 cm    Just Proximal to Ulnar Styloid Process 16.9 cm    Across Hand at Thumb Web Space 20.8 cm    At Base of 2nd Digit 6.7 cm      Left Upper Extremity Lymphedema   10 cm Proximal to Olecranon  Process 34.8 cm    Olecranon Process 26.8 cm      10 cm Proximal to Ulnar Styloid Process 21.8 cm    Just Proximal to Ulnar Styloid Process 16.4 cm    Across Hand at [employer] 20.7 cm    At [place] of 2nd Digit 6.6 cm              Quick Dash - 05/11/20 0001    Open a tight or new jar Mild difficulty    Do heavy household chores (wash walls, wash floors) No difficulty    Carry a shopping bag or briefcase No difficulty    Wash your back Mild difficulty    Use a knife to cut food No difficulty    Recreational activities in which you take some force or impact through your arm, shoulder, or hand (golf, hammering, tennis) No difficulty    During the past week, to what extent has your arm, shoulder or hand problem interfered with your normal social activities with family, friends, neighbors, or groups? Not at all    During the past week, to what extent has your arm, shoulder or hand problem limited your work or other regular daily activities Slightly    Arm, shoulder, or hand pain. Moderate    Tingling (pins and needles) in your arm, shoulder, or hand Mild    Difficulty Sleeping Mild difficulty    DASH Score 15.91 %                          PT Education - 05/11/20 1500    Education Details Aftercare; use of compression for edema; scar massage; HEP    Person(s) Educated Patient    Methods Explanation;Demonstration;Handout    Comprehension Returned demonstration;Verbalized understanding               PT Long Term Goals - 05/11/20 1608      PT LONG TERM GOAL #1   Title Patient will demonstrate she has regained full shoulder ROM and function post op compared to baselines.    Time 4    Period Weeks    Status On-going    Target Date 06/08/20      PT LONG TERM GOAL #2   Title Patient will increase left shoulder active flexion to >/= 145 degrees for increased ease reaching overhead.    Baseline 121    Time 4    Period Weeks    Status New    Target Date  06/08/20      PT LONG TERM GOAL #3   Title Patient will increase left shoulder active abduction to >/= 145 degrees for increased ease obtaining radiation positioning.    Baseline 130    Time 4    Period Weeks    Status New    Target Date 06/08/20      PT LONG TERM GOAL #4   Title Patient will report she feels a >/= 50% improvement in her left breast swelling.    Time 4    Period Weeks    Status New    Target Date 06/08/20      PT LONG TERM GOAL #5   Title Patient will improve her DASH score to be zero (0) for improved overall arm function back to baseline.    Baseline 15.91 post op                 Plan - 05/11/20 1501    Clinical Impression Statement Patient underwent bilateral lumpectomies on 04/16/2020 with a left sentinel node  biopsy and 2 negative nodes removed. Prior to surery, she had neoadjuvant chemotherapy for 18 weeks ending on 02/25/2020. She is scheduled to begin radiation but reports it may be delayed due to a leaking seroma. Seroma was assessed and found to be leaking clear yeloowish orange fluid from a small opening in her breast inferior to her incision (Incision is about 1" away from leaking area). Left shoulder AROM is limited as well. She will benefit from PT to improve shoulder ROM and function and reduce edema.    PT Frequency 2x / week    PT Duration 4 weeks    PT Treatment/Interventions ADLs/Self Care Home Management;Therapeutic exercise;Scar mobilization;Passive range of motion;Patient/family education;Manual techniques    PT Next Visit Plan Assess seroma again and ask outcome of appointment with surgeon's office today. Begin PROM left shoulder    PT Home Exercise Plan Post op shoulder ROM HEP and closed chain flexion/abduction    Consulted and Agree with Plan of Care Patient           Patient will benefit from skilled therapeutic intervention in order to improve the following deficits and impairments:  Postural dysfunction,Decreased range of  motion,Decreased knowledge of precautions,Impaired UE functional use,Pain,Increased edema,Decreased scar mobility  Visit Diagnosis: Malignant neoplasm of upper-outer quadrant of left breast in female, estrogen receptor negative ([place]) - Plan: PT plan of care cert/re-cert  Abnormal posture - Plan: PT plan of care cert/re-cert  Aftercare following surgery for neoplasm - Plan: PT plan of care cert/re-cert  Localized edema - Plan: PT plan of care cert/re-cert  Stiffness of left shoulder, not elsewhere classified - Plan: PT plan of care cert/re-cert     Problem List Patient Active Problem List   Diagnosis Date Noted  . Ductal carcinoma in situ (DCIS) of right breast 05/06/2020  . Genetic testing 11/04/2019  . Port-A-Cath in place 10/22/2019  . Malignant neoplasm of upper-outer quadrant of left breast in female, estrogen receptor negative ([place]) 10/03/2019  . Unspecified arthropathy, ankle and foot 09/24/2009  . HALLUX RIGIDUS 09/24/2009   Annia Friendly, PT 05/11/20 4:12 PM  [place], [place], [address] Phone: [phone]   Fax:  [phone]  Name: GISELLA ALWINE MRN: [id number] Date of Birth: Apr 04, 1950

## 2020-05-11 NOTE — Patient Instructions (Addendum)
            [facility]         [address], [place] [address]         [phone]         Annia Friendly, PT, CLT   After Breast Cancer Class It is recommended you attend the ABC class to be educated on lymphedema risk reduction. This class is free of charge and lasts for 1 hour. It is a 1-time class.  You are scheduled for May 16th at 11:00. We will send you a link through Webex so you can join the meeting.  Scar massage You can begin gentle scar massage using coconut oil or vitamin E cream to your incisions. Wait until the glue comes off to begin this.   Compression garment You need to wearing a good fitting (tight) compression bra or sports bra to reduce your swelling.   Home exercise Program Begin doing the exercises I gave you last fall and also the 2 new ones below.   Follow up PT: It is recommended you return every 3 months for the first 3 years following surgery to be assessed on the SOZO machine for an L-Dex score. This helps prevent clinically significant lymphedema in 95% of patients. These follow up screens are 15 minute appointments that you are not billed for. APPOINTMENTS FOR THIS AFTER 06/30/2020 WILL BE LOCATED AT [facility] AT [address] [place] [address]. You are scheduled for July 11th at 11:15.  Closed Chain: Shoulder Abduction / Adduction - on Wall    One hand on wall, step to side and return. Stepping causes shoulder to abduct and adduct. Step _5__ times, holding 5 seconds, __2_ times per day.  http://ss.exer.us/267   Copyright  VHI. All rights reserved.  Closed Chain: Shoulder Flexion / Extension - on Wall    Hands on wall, step backward. Return. Stepping causes shoulder flexion and extension Step _5__ times, holding 5 seconds, __2_ times per day. http://ss.exer.us/265   Copyright  VHI. All rights reserved.

## 2020-05-14 ENCOUNTER — Ambulatory Visit: Payer: Medicare HMO | Admitting: Rehabilitation

## 2020-05-14 ENCOUNTER — Other Ambulatory Visit: Payer: Self-pay

## 2020-05-14 ENCOUNTER — Encounter: Payer: Self-pay | Admitting: Rehabilitation

## 2020-05-14 DIAGNOSIS — Z483 Aftercare following surgery for neoplasm: Secondary | ICD-10-CM

## 2020-05-14 DIAGNOSIS — R293 Abnormal posture: Secondary | ICD-10-CM

## 2020-05-14 DIAGNOSIS — R600 Localized edema: Secondary | ICD-10-CM

## 2020-05-14 DIAGNOSIS — M25612 Stiffness of left shoulder, not elsewhere classified: Secondary | ICD-10-CM

## 2020-05-14 DIAGNOSIS — Z171 Estrogen receptor negative status [ER-]: Secondary | ICD-10-CM | POA: Diagnosis not present

## 2020-05-14 DIAGNOSIS — C50412 Malignant neoplasm of upper-outer quadrant of left female breast: Secondary | ICD-10-CM

## 2020-05-14 NOTE — Therapy (Signed)
[place], [place], [address] Phone: [phone]   Fax:  [phone]  Physical Therapy Treatment  Patient Details  Name: Cheyenne Mcdonald MRN: [id number] Date of Birth: 24-Dec-1950 Referring Provider (PT): Dr. [name]   Encounter Date: 05/14/2020   PT End of Session - 05/14/20 1051    Visit Number 3    Number of Visits 10    Date for PT Re-Evaluation 06/08/20    Authorization Type [employer] MCR    Authorization - Visit Number 1    Progress Note Due on Visit 10    PT Start Time 5498    PT Stop Time 1050    PT Time Calculation (min) 35 min    Activity Tolerance Patient tolerated treatment well    Behavior During Therapy [facility] for tasks assessed/performed           Past Medical History:  Diagnosis Date  . Breast cancer ([place])   . Obesity   . Sleep apnea     Past Surgical History:  Procedure Laterality Date  . ABDOMINAL HYSTERECTOMY    . BREAST LUMPECTOMY WITH RADIOACTIVE SEED AND SENTINEL LYMPH NODE BIOPSY Left 04/16/2020   Procedure: LEFT BREAST LUMPECTOMY WITH RADIOACTIVE SEED AND LEFT AXILLARY SENTINEL LYMPH NODE BIOPSY;  Surgeon: [name], MD;  Location: [facility];  Service: General;  Laterality: Left;  . BREAST LUMPECTOMY WITH RADIOACTIVE SEED LOCALIZATION Right 04/16/2020   Procedure: RIGHT BREAST LUMPECTOMY WITH RADIOACTIVE SEED LOCALIZATION;  Surgeon: [name], MD;  Location: [facility];  Service: General;  Laterality: Right;  . IR IMAGING GUIDED PORT INSERTION  10/18/2019    There were no vitals filed for this visit.   Subjective Assessment - 05/14/20 1018    Subjective I got an antibiotic and have taken 4 days so far and they aspirated 15cc.  I have an appt with Dr. Ninfa Linden on this coming Monday.  I will see Dr. Sondra Come now on the 10th.    Pertinent History Patient was diagnosed on 09/30/2019 with left grade III triple negative invasive  ductal carcinoma breast cancer. It measures 9 mm and 1.4 cm both located in the upper outer quadrant. Ki67 is 90%.    Currently in Pain? Yes    Pain Score 2     Pain Location Breast    Pain Orientation Left    Pain Descriptors / Indicators Tender    Pain Type Surgical pain    Pain Onset 1 to 4 weeks ago    Pain Frequency Constant              [facility] PT Assessment - 05/14/20 0001      Observation/Other Assessments   Observations some redness to the breast and still draining yellow fluid                         [facility] Adult PT Treatment/Exercise - 05/14/20 0001      Exercises   Exercises Shoulder      Shoulder Exercises: Supine   Flexion Both;AAROM;10 reps    Flexion Limitations with dowel    Other Supine Exercises supine butterfly stretch x 30"      Shoulder Exercises: Pulleys   Flexion 2 minutes    Flexion Limitations with cueing for first session    ABduction 2 minutes    ABduction Limitations with cueing for first session      Shoulder Exercises: Therapy Diona Foley  Flexion Both;5 reps    Flexion Limitations 3 second hold      Manual Therapy   Manual Therapy Passive ROM    Passive ROM of the left shoulder into flexion, abduction, and ER tolerated very well                       PT Long Term Goals - 05/11/20 1608      PT LONG TERM GOAL #1   Title Patient will demonstrate she has regained full shoulder ROM and function post op compared to baselines.    Time 4    Period Weeks    Status On-going    Target Date 06/08/20      PT LONG TERM GOAL #2   Title Patient will increase left shoulder active flexion to >/= 145 degrees for increased ease reaching overhead.    Baseline 121    Time 4    Period Weeks    Status New    Target Date 06/08/20      PT LONG TERM GOAL #3   Title Patient will increase left shoulder active abduction to >/= 145 degrees for increased ease obtaining radiation positioning.    Baseline 130    Time 4    Period  Weeks    Status New    Target Date 06/08/20      PT LONG TERM GOAL #4   Title Patient will report she feels a >/= 50% improvement in her left breast swelling.    Time 4    Period Weeks    Status New    Target Date 06/08/20      PT LONG TERM GOAL #5   Title Patient will improve her DASH score to be zero (0) for improved overall arm function back to baseline.    Baseline 15.91 post op                 Plan - 05/14/20 1052    Clinical Impression Statement Pt continues with left seroma with possible infection which is being treated and followed by Dr. Ninfa Linden.  Began PROM, AAROM today which was tolerated very well with mild pulling in the axilla only.    PT Frequency 2x / week    PT Duration 4 weeks    PT Treatment/Interventions ADLs/Self Care Home Management;Therapeutic exercise;Scar mobilization;Passive range of motion;Patient/family education;Manual techniques    PT Next Visit Plan Assess seroma throughout/ how was MD appt? cont PROM / AAROM left shoulder moving into strengthening as able    Consulted and Agree with Plan of Care Patient           Patient will benefit from skilled therapeutic intervention in order to improve the following deficits and impairments:  Postural dysfunction,Decreased range of motion,Decreased knowledge of precautions,Impaired UE functional use,Pain,Increased edema,Decreased scar mobility  Visit Diagnosis: Malignant neoplasm of upper-outer quadrant of left breast in female, estrogen receptor negative ([place])  Abnormal posture  Aftercare following surgery for neoplasm  Localized edema  Stiffness of left shoulder, not elsewhere classified     Problem List Patient Active Problem List   Diagnosis Date Noted  . Ductal carcinoma in situ (DCIS) of right breast 05/06/2020  . Genetic testing 11/04/2019  . Port-A-Cath in place 10/22/2019  . Malignant neoplasm of upper-outer quadrant of left breast in female, estrogen receptor negative ([place])  10/03/2019  . Unspecified arthropathy, ankle and foot 09/24/2009  . HALLUX RIGIDUS 09/24/2009    Shan Levans R 05/14/2020, 10:56 AM  [place], [place], [address] Phone: [phone]   Fax:  [phone]  Name: Cheyenne Mcdonald MRN: [id number] Date of Birth: [birth year]

## 2020-05-18 DIAGNOSIS — Z20822 Contact with and (suspected) exposure to covid-19: Secondary | ICD-10-CM | POA: Diagnosis not present

## 2020-05-19 ENCOUNTER — Ambulatory Visit: Payer: Medicare HMO | Admitting: Radiation Oncology

## 2020-05-19 ENCOUNTER — Encounter: Payer: Self-pay | Admitting: Rehabilitation

## 2020-05-20 ENCOUNTER — Other Ambulatory Visit: Payer: Self-pay

## 2020-05-20 ENCOUNTER — Ambulatory Visit
Admission: RE | Admit: 2020-05-20 | Discharge: 2020-05-20 | Disposition: A | Discharge status: Home / Self Care | Payer: Medicare HMO | Source: Ambulatory Visit | Attending: Radiation Oncology | Admitting: Radiation Oncology

## 2020-05-20 DIAGNOSIS — C50412 Malignant neoplasm of upper-outer quadrant of left female breast: Secondary | ICD-10-CM

## 2020-05-20 DIAGNOSIS — Z510 Encounter for antineoplastic radiation therapy: Secondary | ICD-10-CM | POA: Diagnosis not present

## 2020-05-20 DIAGNOSIS — D0511 Intraductal carcinoma in situ of right breast: Secondary | ICD-10-CM | POA: Diagnosis not present

## 2020-05-20 DIAGNOSIS — Z170 Estrogen receptor positive status [ER+]: Secondary | ICD-10-CM | POA: Insufficient documentation

## 2020-05-20 NOTE — Addendum Note (Signed)
Encounter addended by: Gery Pray, MD on: 05/20/2020 8:21 AM  Actions taken: Clinical Note Signed

## 2020-05-22 ENCOUNTER — Other Ambulatory Visit: Payer: Self-pay

## 2020-05-22 ENCOUNTER — Encounter: Payer: Self-pay | Admitting: Rehabilitation

## 2020-05-22 ENCOUNTER — Ambulatory Visit: Payer: Medicare HMO | Admitting: Rehabilitation

## 2020-05-22 DIAGNOSIS — C50412 Malignant neoplasm of upper-outer quadrant of left female breast: Secondary | ICD-10-CM

## 2020-05-22 DIAGNOSIS — R293 Abnormal posture: Secondary | ICD-10-CM | POA: Diagnosis not present

## 2020-05-22 DIAGNOSIS — Z483 Aftercare following surgery for neoplasm: Secondary | ICD-10-CM | POA: Diagnosis not present

## 2020-05-22 DIAGNOSIS — Z171 Estrogen receptor negative status [ER-]: Secondary | ICD-10-CM

## 2020-05-22 DIAGNOSIS — M25612 Stiffness of left shoulder, not elsewhere classified: Secondary | ICD-10-CM

## 2020-05-22 DIAGNOSIS — R600 Localized edema: Secondary | ICD-10-CM

## 2020-05-22 NOTE — Therapy (Signed)
[place] Mcd[place], [place], [address] Phone: [phone]   Fax:  [phone]  Physical Therapy Treatment  Patient Details  Name: Cheyenne Mcdonald MRN: [id number] Date of Birth: 1950-10-07 Referring Provider (PT): Dr. [name]   Encounter Date: 05/22/2020   PT End of Session - 05/22/20 0844    Visit Number 4    Number of Visits 10    Date for PT Re-Evaluation 06/08/20    Authorization - Visit Number 2    Progress Note Due on Visit 10    PT Start Time 0811    PT Stop Time 0845    PT Time Calculation (min) 34 min    Activity Tolerance Patient tolerated treatment well    Behavior During Therapy [facility] for tasks assessed/performed           Past Medical History:  Diagnosis Date  . Breast cancer ([place])   . Obesity   . Sleep apnea     Past Surgical History:  Procedure Laterality Date  . ABDOMINAL HYSTERECTOMY    . BREAST LUMPECTOMY WITH RADIOACTIVE SEED AND SENTINEL LYMPH NODE BIOPSY Left 04/16/2020   Procedure: LEFT BREAST LUMPECTOMY WITH RADIOACTIVE SEED AND LEFT AXILLARY SENTINEL LYMPH NODE BIOPSY;  Surgeon: [name], MD;  Location: [place];  Service: General;  Laterality: Left;  . BREAST LUMPECTOMY WITH RADIOACTIVE SEED LOCALIZATION Right 04/16/2020   Procedure: RIGHT BREAST LUMPECTOMY WITH RADIOACTIVE SEED LOCALIZATION;  Surgeon: [name], MD;  Location: [place];  Service: General;  Laterality: Right;  . IR IMAGING GUIDED PORT INSERTION  10/18/2019    There were no vitals filed for this visit.   Subjective Assessment - 05/22/20 0812    Subjective It is so much better.  I had the antibiotic for another week.  I am ready to go for radiation.  I start radiation on the 23rd.  I was fine in the that position. Still draining one gauze per day.    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 It measures 9 mm and 1.4 cm both located in the upper outer quadrant. Ki67 is 90%.    Patient Stated Goals See if my arm is back to normal    Currently in Pain? Yes    Pain Score 1     Pain Location Breast    Pain Orientation Left    Pain Descriptors / Indicators Tender    Pain Type Surgical pain    Pain Onset More than a month ago    Pain Frequency Constant              [facility] PT Assessment - 05/22/20 0001      AROM   Left Shoulder Extension 60 Degrees    Left Shoulder Flexion 152 Degrees   slight pull   Left Shoulder ABduction 147 Degrees    Left Shoulder Internal Rotation 67 Degrees    Left Shoulder External Rotation 90 Degrees                 Quick Dash - 05/22/20 0001    Open a tight or new jar No difficulty    Do heavy household chores (wash walls, wash floors) No difficulty    Carry a shopping bag or briefcase No difficulty    Wash your back No difficulty    Use a knife to cut food No difficulty    Recreational  activities in which you take some force or impact through your arm, shoulder, or hand (golf, hammering, tennis) No difficulty    During the past week, to what extent has your arm, shoulder or hand problem interfered with your normal social activities with family, friends, neighbors, or groups? Not at all    During the past week, to what extent has your arm, shoulder or hand problem limited your work or other regular daily activities Not at all    Arm, shoulder, or hand pain. None    Tingling (pins and needles) in your arm, shoulder, or hand None    Difficulty Sleeping No difficulty    DASH Score 0 %                  [facility] Adult PT Treatment/Exercise - 05/22/20 0001      Exercises   Exercises Other Exercises      Shoulder Exercises: Supine   Horizontal ABduction 15 reps    Theraband Level (Shoulder Horizontal ABduction) Level 1 (Yellow)    Horizontal ABduction Limitations added to HEP and switched to red at this  point due to ease of yellow    Diagonals Both;15 reps    Theraband Level (Shoulder Diagonals) Level 2 (Red)    Diagonals Limitations added to HEP      Shoulder Exercises: Standing   External Rotation Both;15 reps    Theraband Level (Shoulder External Rotation) Level 1 (Yellow)    External Rotation Limitations seated added to HEP with initial cueing    Extension Both;15 reps    Theraband Level (Shoulder Extension) Level 1 (Yellow)    Extension Limitations initial cueing and added to HEP    Row Both;15 reps    Theraband Level (Shoulder Row) Level 1 (Yellow)    Row Limitations with initial cueing and added to HEP                       PT Long Term Goals - 05/22/20 0819      PT LONG TERM GOAL #1   Title Patient will demonstrate she has regained full shoulder ROM and function post op compared to baselines.    Status Achieved      PT LONG TERM GOAL #2   Title Patient will increase left shoulder active flexion to >/= 145 degrees for increased ease reaching overhead.    Status Achieved      PT LONG TERM GOAL #3   Title Patient will increase left shoulder active abduction to >/= 145 degrees for increased ease obtaining radiation positioning.    Status Partially Met      PT LONG TERM GOAL #4   Title Patient will report she feels a >/= 50% improvement in her left breast swelling.    Status Achieved      PT LONG TERM GOAL #5   Title Patient will improve her DASH score to be zero (0) for improved overall arm function back to baseline.    Status Achieved      Additional Long Term Goals   Additional Long Term Goals Yes      PT LONG TERM GOAL #6   Title Pt will be ind with strength ABC for the left UE    Time 4    Period Weeks    Status New                 Plan - 05/22/20 0900    Clinical Impression Statement Pt without  seroma complications today with one very small pinprick spot that continues to drain mainly after a shower.  Pt is ready to start radiation.   Pt has returned to full AROM and reports no limitations with ADLs with QDASH back to 0.  Pt will continue with at least one more visit to learn strength ABC program and then may follow up after radiation or instruct in return as needed.    PT Frequency 2x / week    PT Duration 4 weeks    PT Treatment/Interventions ADLs/Self Care Home Management;Therapeutic exercise;Scar mobilization;Passive range of motion;Patient/family education;Manual techniques    PT Next Visit Plan , strength ABC or stick with band...cont PROM / AAROM left shoulder moving into strengthening as able    Consulted and Agree with Plan of Care Patient           Patient will benefit from skilled therapeutic intervention in order to improve the following deficits and impairments:     Visit Diagnosis: Malignant neoplasm of upper-outer quadrant of left breast in female, estrogen receptor negative ([place])  Abnormal posture  Aftercare following surgery for neoplasm  Localized edema  Stiffness of left shoulder, not elsewhere classified     Problem List Patient Active Problem List   Diagnosis Date Noted  . Ductal carcinoma in situ (DCIS) of right breast 05/06/2020  . Genetic testing 11/04/2019  . Port-A-Cath in place 10/22/2019  . Malignant neoplasm of upper-outer quadrant of left breast in female, estrogen receptor negative ([place]) 10/03/2019  . Unspecified arthropathy, ankle and foot 09/24/2009  . HALLUX RIGIDUS 09/24/2009    Stark Bray 05/22/2020, 9:03 AM  [place], [place], [address] Phone: [phone]   Fax:  [phone]  Name: Cheyenne Mcdonald MRN: [id number] Date of Birth: 12/11/1950

## 2020-05-22 NOTE — Patient Instructions (Signed)
Access Code: [email or web address]: [email or web address]: 05/13/2022Prepared by: Marcene Brawn TevisExercises  Standing Row with Anchored Resistance - 1 x daily - 3-4 x weekly - 1-3 sets - 10 reps - 2-3 second hold  Shoulder extension with resistance - Neutral - 1 x daily - 3 x weekly - 1-3 sets - 10 reps - 2-3 second hold  Standing Shoulder External Rotation with Resistance - 1 x daily - 3 x weekly - 1-3 sets - 10 reps - 2-3 second hold  Supine Shoulder Horizontal Abduction with Resistance - 1 x daily - 3 x weekly - 1-3 sets - 10 reps - 2-3 second hold  Standing Shoulder Horizontal and Diagonal Pulls with Resistance - 1 x daily - 3 x weekly - 1-3 sets - 10 reps - 2-3 second hold

## 2020-05-25 ENCOUNTER — Encounter: Payer: Self-pay | Admitting: *Deleted

## 2020-05-25 DIAGNOSIS — C50911 Malignant neoplasm of unspecified site of right female breast: Secondary | ICD-10-CM | POA: Diagnosis not present

## 2020-05-25 DIAGNOSIS — C50912 Malignant neoplasm of unspecified site of left female breast: Secondary | ICD-10-CM | POA: Diagnosis not present

## 2020-05-26 ENCOUNTER — Ambulatory Visit: Payer: Medicare HMO | Admitting: Radiation Oncology

## 2020-05-26 ENCOUNTER — Encounter: Payer: Self-pay | Admitting: Rehabilitation

## 2020-05-27 ENCOUNTER — Ambulatory Visit: Payer: Medicare HMO | Admitting: Radiation Oncology

## 2020-05-27 ENCOUNTER — Ambulatory Visit: Payer: Medicare HMO

## 2020-05-27 ENCOUNTER — Telehealth: Payer: Self-pay | Admitting: Oncology

## 2020-05-27 DIAGNOSIS — Z510 Encounter for antineoplastic radiation therapy: Secondary | ICD-10-CM | POA: Diagnosis not present

## 2020-05-27 DIAGNOSIS — C50412 Malignant neoplasm of upper-outer quadrant of left female breast: Secondary | ICD-10-CM | POA: Diagnosis not present

## 2020-05-27 DIAGNOSIS — D0511 Intraductal carcinoma in situ of right breast: Secondary | ICD-10-CM | POA: Diagnosis not present

## 2020-05-27 DIAGNOSIS — Z170 Estrogen receptor positive status [ER+]: Secondary | ICD-10-CM | POA: Diagnosis not present

## 2020-05-27 NOTE — Telephone Encounter (Signed)
Scheduled appt per 5/16 sch msg. Called pt, no answer. Left msg with appt date and time.  

## 2020-05-28 ENCOUNTER — Ambulatory Visit: Payer: Medicare HMO

## 2020-05-29 ENCOUNTER — Ambulatory Visit: Payer: Medicare HMO

## 2020-05-29 ENCOUNTER — Ambulatory Visit: Payer: Medicare HMO | Admitting: Rehabilitation

## 2020-05-29 ENCOUNTER — Encounter: Payer: Self-pay | Admitting: Rehabilitation

## 2020-05-29 ENCOUNTER — Other Ambulatory Visit: Payer: Self-pay

## 2020-05-29 DIAGNOSIS — C50412 Malignant neoplasm of upper-outer quadrant of left female breast: Secondary | ICD-10-CM | POA: Diagnosis not present

## 2020-05-29 DIAGNOSIS — R600 Localized edema: Secondary | ICD-10-CM

## 2020-05-29 DIAGNOSIS — M25612 Stiffness of left shoulder, not elsewhere classified: Secondary | ICD-10-CM

## 2020-05-29 DIAGNOSIS — Z171 Estrogen receptor negative status [ER-]: Secondary | ICD-10-CM | POA: Diagnosis not present

## 2020-05-29 DIAGNOSIS — R293 Abnormal posture: Secondary | ICD-10-CM | POA: Diagnosis not present

## 2020-05-29 DIAGNOSIS — Z483 Aftercare following surgery for neoplasm: Secondary | ICD-10-CM | POA: Diagnosis not present

## 2020-05-29 NOTE — Patient Instructions (Signed)
Access Code: [email or web address]: [email or web address]: 05/20/2022Prepared by: Mahlon Gammon Notes Do these exercises 2-3 times a week making sure you have a day of rest in between sessions. Before you start a session, warm up with a brisk walk and stretches for your arms, legs and back. Start with 2 sets of 10 of each exercise If you have no pain or swelling, next time do 3 sets of 10. If still no pain the next session, its ok to add some small weight, but back to down to 2 sets of 10 . the next time do 3 sets of 10 with that weight and continue to progress with that pattern. If you increase weight, decrease reps, or if you increase reps, keep weight the same. If you do have symptoms, back off on your weight or reps. If for some reason, you do skip some scheduled exercise sessions, start over with minimal weight and build up slowly again.  Your overall exercise goal is about 150 minutes of moderate intensity (you can talk but not sing) exercise a week that includes 2 sessions of strength training. GO FOR IT! :) Exercises  Standing Row with Anchored Resistance - 1 x daily - 3-4 x weekly - 1-3 sets - 10 reps - 2-3 second hold  Shoulder extension with resistance - Neutral - 1 x daily - 3 x weekly - 1-3 sets - 10 reps - 2-3 second hold  Standing Shoulder External Rotation with Resistance - 1 x daily - 3 x weekly - 1-3 sets - 10 reps - 2-3 second hold  Supine Shoulder Horizontal Abduction with Resistance - 1 x daily - 3 x weekly - 1-3 sets - 10 reps - 2-3 second hold  Supine PNF D2 Flexion with Resistance - 1 x daily - 3 x weekly - 1-3 sets - 10 reps - 2-3 second hold  Standing Hip Abduction - 2 x weekly - 10 reps - 2-3 sets  Scaption with Dumbbells - 2 x weekly - 10 reps - 2-3 sets  Standing Alternating Bicep Curls with Dumbbells and Rotation - 2 x weekly - 10 reps - 2-3 sets  Standing Heel Raise - 2 x weekly - 10 reps - 2-3 sets Patient Education  walking program

## 2020-05-29 NOTE — Therapy (Signed)
[place], [place], [address] Phone: [phone]   Fax:  [phone]  Physical Therapy Treatment  Patient Details  Name: Cheyenne Mcdonald MRN: [id number] Date of Birth: 1950/04/21 Referring Provider (PT): Dr. [name]   Encounter Date: 05/29/2020   PT End of Session - 05/29/20 0956    Visit Number 5    Number of Visits 10    Date for PT Re-Evaluation 06/08/20    PT Start Time 0914    PT Stop Time 0954    PT Time Calculation (min) 40 min    Activity Tolerance Patient tolerated treatment well    Behavior During Therapy [facility] for tasks assessed/performed           Past Medical History:  Diagnosis Date  . Breast cancer ([place])   . Obesity   . Sleep apnea     Past Surgical History:  Procedure Laterality Date  . ABDOMINAL HYSTERECTOMY    . BREAST LUMPECTOMY WITH RADIOACTIVE SEED AND SENTINEL LYMPH NODE BIOPSY Left 04/16/2020   Procedure: LEFT BREAST LUMPECTOMY WITH RADIOACTIVE SEED AND LEFT AXILLARY SENTINEL LYMPH NODE BIOPSY;  Surgeon: [name], MD;  Location: [place];  Service: General;  Laterality: Left;  . BREAST LUMPECTOMY WITH RADIOACTIVE SEED LOCALIZATION Right 04/16/2020   Procedure: RIGHT BREAST LUMPECTOMY WITH RADIOACTIVE SEED LOCALIZATION;  Surgeon: [name], MD;  Location: [place];  Service: General;  Laterality: Right;  . IR IMAGING GUIDED PORT INSERTION  10/18/2019    There were no vitals filed for this visit.   Subjective Assessment - 05/29/20 0914    Subjective I am ready to do for radiation.  I got a walk in. I am working in the garden. I got that compression bra and it is amazing.  It has really helped    Pertinent History Patient was diagnosed on 09/30/2019 with left grade III triple negative invasive ductal carcinoma breast cancer. It measures 9 mm and 1.4 cm both located in the upper outer quadrant. Ki67 is 90%.    Currently in  Pain? No/denies   intermittent sharp pain                            [facility] Adult PT Treatment/Exercise - 05/29/20 0001      Self-Care   Self-Care Scar Mobilizations    Scar Mobilizations Reviewed scar massage now that pt is fully healed as well as care during radiation and after      Exercises   Other Exercises  performed final HEP per instruction section x 10 of each with red band.  Calf raises, bicep curls 3#, scaption 3#, hip abduction, row, extension, seated bil ER, supine horizontal abduction                  PT Education - 05/29/20 0955    Education Details final HEP, scar massage, when to return, POC    Person(s) Educated Patient    Methods Explanation;Demonstration;Tactile cues;Verbal cues;Handout    Comprehension Verbalized understanding;Returned demonstration               PT Long Term Goals - 05/29/20 0957      PT LONG TERM GOAL #1   Title Patient will demonstrate she has regained full shoulder ROM and function post op compared to baselines.    Status Achieved      PT LONG TERM GOAL #2   Title Patient  will increase left shoulder active flexion to >/= 145 degrees for increased ease reaching overhead.    Status Achieved      PT LONG TERM GOAL #3   Title Patient will increase left shoulder active abduction to >/= 145 degrees for increased ease obtaining radiation positioning.    Status Achieved      PT LONG TERM GOAL #4   Title Patient will report she feels a >/= 50% improvement in her left breast swelling.    Status Achieved      PT LONG TERM GOAL #5   Title Patient will improve her DASH score to be zero (0) for improved overall arm function back to baseline.    Status Achieved      PT LONG TERM GOAL #6   Title Pt will be ind with strength ABC for the left UE    Status Achieved                 Plan - 05/29/20 0956    Clinical Impression Statement Pt is doing very well and ready for radiation.  Full AROM, back to  walking and strength training, continues with firmness and skin wrinkling from seroma site but now closed and pt is able to start scar massage.  Pt will continue with SOZO screening and knows when to return if needed.    Consulted and Agree with Plan of Care Patient           Patient will benefit from skilled therapeutic intervention in order to improve the following deficits and impairments:     Visit Diagnosis: Malignant neoplasm of upper-outer quadrant of left breast in female, estrogen receptor negative ([place])  Abnormal posture  Aftercare following surgery for neoplasm  Localized edema  Stiffness of left shoulder, not elsewhere classified     Problem List Patient Active Problem List   Diagnosis Date Noted  . Ductal carcinoma in situ (DCIS) of right breast 05/06/2020  . Genetic testing 11/04/2019  . Port-A-Cath in place 10/22/2019  . Malignant neoplasm of upper-outer quadrant of left breast in female, estrogen receptor negative ([place]) 10/03/2019  . Unspecified arthropathy, ankle and foot 09/24/2009  . HALLUX RIGIDUS 09/24/2009    Stark Bray 05/29/2020, 9:58 AM  [place], [place], [address] Phone: [phone]   Fax:  [phone]  Name: Cheyenne Mcdonald MRN: [id number] Date of Birth: [birth year]

## 2020-06-01 ENCOUNTER — Other Ambulatory Visit: Payer: Self-pay

## 2020-06-01 ENCOUNTER — Ambulatory Visit
Admission: RE | Admit: 2020-06-01 | Discharge: 2020-06-01 | Disposition: A | Discharge status: Home / Self Care | Payer: Medicare HMO | Source: Ambulatory Visit | Attending: Radiation Oncology | Admitting: Radiation Oncology

## 2020-06-01 ENCOUNTER — Ambulatory Visit: Payer: Medicare HMO

## 2020-06-01 DIAGNOSIS — C50412 Malignant neoplasm of upper-outer quadrant of left female breast: Secondary | ICD-10-CM | POA: Diagnosis not present

## 2020-06-01 DIAGNOSIS — Z510 Encounter for antineoplastic radiation therapy: Secondary | ICD-10-CM | POA: Diagnosis not present

## 2020-06-01 DIAGNOSIS — Z171 Estrogen receptor negative status [ER-]: Secondary | ICD-10-CM

## 2020-06-01 DIAGNOSIS — D0511 Intraductal carcinoma in situ of right breast: Secondary | ICD-10-CM | POA: Diagnosis not present

## 2020-06-01 DIAGNOSIS — Z170 Estrogen receptor positive status [ER+]: Secondary | ICD-10-CM | POA: Diagnosis not present

## 2020-06-02 ENCOUNTER — Ambulatory Visit
Admission: RE | Admit: 2020-06-02 | Discharge: 2020-06-02 | Disposition: A | Discharge status: Home / Self Care | Payer: Medicare HMO | Source: Ambulatory Visit | Attending: Radiation Oncology | Admitting: Radiation Oncology

## 2020-06-02 DIAGNOSIS — C50412 Malignant neoplasm of upper-outer quadrant of left female breast: Secondary | ICD-10-CM | POA: Diagnosis not present

## 2020-06-02 DIAGNOSIS — D0511 Intraductal carcinoma in situ of right breast: Secondary | ICD-10-CM | POA: Diagnosis not present

## 2020-06-02 DIAGNOSIS — Z170 Estrogen receptor positive status [ER+]: Secondary | ICD-10-CM | POA: Diagnosis not present

## 2020-06-02 DIAGNOSIS — Z510 Encounter for antineoplastic radiation therapy: Secondary | ICD-10-CM | POA: Diagnosis not present

## 2020-06-02 MED ORDER — RADIAPLEXRX EX GEL: Freq: Once | CUTANEOUS | Status: AC

## 2020-06-02 MED ORDER — ALRA NON-METALLIC DEODORANT (RAD-ONC)
1.0000 "application " | Freq: Once | TOPICAL | Status: AC
  Administered 2020-06-02: 1 via TOPICAL

## 2020-06-02 NOTE — Progress Notes (Signed)
Pt here for patient teaching.    Pt given Radiation and You booklet, skin care instructions, Alra deodorant and Radiaplex gel.    Reviewed areas of pertinence such as fatigue, hair loss, skin changes, breast tenderness and breast swelling .   Pt able to give teach back of to pat skin, use unscented/gentle soap and use baby wipes,apply Radiaplex bid, avoid applying anything to skin within 4 hours of treatment and avoid wearing an under wire bra.   Pt demonstrated understanding of information given and will contact nursing with any questions or concerns.

## 2020-06-03 ENCOUNTER — Ambulatory Visit
Admission: RE | Admit: 2020-06-03 | Discharge: 2020-06-03 | Disposition: A | Discharge status: Home / Self Care | Payer: Medicare HMO | Source: Ambulatory Visit | Attending: Radiation Oncology | Admitting: Radiation Oncology

## 2020-06-03 ENCOUNTER — Other Ambulatory Visit: Payer: Self-pay

## 2020-06-03 DIAGNOSIS — D0511 Intraductal carcinoma in situ of right breast: Secondary | ICD-10-CM | POA: Diagnosis not present

## 2020-06-03 DIAGNOSIS — C50412 Malignant neoplasm of upper-outer quadrant of left female breast: Secondary | ICD-10-CM | POA: Diagnosis not present

## 2020-06-03 DIAGNOSIS — Z170 Estrogen receptor positive status [ER+]: Secondary | ICD-10-CM | POA: Diagnosis not present

## 2020-06-03 DIAGNOSIS — Z510 Encounter for antineoplastic radiation therapy: Secondary | ICD-10-CM | POA: Diagnosis not present

## 2020-06-04 ENCOUNTER — Ambulatory Visit: Payer: Medicare HMO

## 2020-06-04 ENCOUNTER — Ambulatory Visit
Admission: RE | Admit: 2020-06-04 | Discharge: 2020-06-04 | Disposition: A | Discharge status: Home / Self Care | Payer: Medicare HMO | Source: Ambulatory Visit | Attending: Radiation Oncology | Admitting: Radiation Oncology

## 2020-06-04 DIAGNOSIS — Z510 Encounter for antineoplastic radiation therapy: Secondary | ICD-10-CM | POA: Diagnosis not present

## 2020-06-04 DIAGNOSIS — D0511 Intraductal carcinoma in situ of right breast: Secondary | ICD-10-CM | POA: Diagnosis not present

## 2020-06-04 DIAGNOSIS — C50412 Malignant neoplasm of upper-outer quadrant of left female breast: Secondary | ICD-10-CM | POA: Diagnosis not present

## 2020-06-04 DIAGNOSIS — Z170 Estrogen receptor positive status [ER+]: Secondary | ICD-10-CM | POA: Diagnosis not present

## 2020-06-05 ENCOUNTER — Ambulatory Visit
Admission: RE | Admit: 2020-06-05 | Discharge: 2020-06-05 | Disposition: A | Discharge status: Home / Self Care | Payer: Medicare HMO | Source: Ambulatory Visit | Attending: Radiation Oncology | Admitting: Radiation Oncology

## 2020-06-05 DIAGNOSIS — Z170 Estrogen receptor positive status [ER+]: Secondary | ICD-10-CM | POA: Diagnosis not present

## 2020-06-05 DIAGNOSIS — Z510 Encounter for antineoplastic radiation therapy: Secondary | ICD-10-CM | POA: Diagnosis not present

## 2020-06-05 DIAGNOSIS — D0511 Intraductal carcinoma in situ of right breast: Secondary | ICD-10-CM | POA: Diagnosis not present

## 2020-06-05 DIAGNOSIS — C50412 Malignant neoplasm of upper-outer quadrant of left female breast: Secondary | ICD-10-CM | POA: Diagnosis not present

## 2020-06-09 ENCOUNTER — Ambulatory Visit
Admission: RE | Admit: 2020-06-09 | Discharge: 2020-06-09 | Disposition: A | Discharge status: Home / Self Care | Payer: Medicare HMO | Source: Ambulatory Visit | Attending: Radiation Oncology | Admitting: Radiation Oncology

## 2020-06-09 ENCOUNTER — Other Ambulatory Visit: Payer: Self-pay

## 2020-06-09 DIAGNOSIS — C50412 Malignant neoplasm of upper-outer quadrant of left female breast: Secondary | ICD-10-CM | POA: Diagnosis not present

## 2020-06-09 DIAGNOSIS — Z510 Encounter for antineoplastic radiation therapy: Secondary | ICD-10-CM | POA: Diagnosis not present

## 2020-06-09 DIAGNOSIS — Z170 Estrogen receptor positive status [ER+]: Secondary | ICD-10-CM | POA: Diagnosis not present

## 2020-06-09 DIAGNOSIS — D0511 Intraductal carcinoma in situ of right breast: Secondary | ICD-10-CM | POA: Diagnosis not present

## 2020-06-10 ENCOUNTER — Ambulatory Visit
Admission: RE | Admit: 2020-06-10 | Discharge: 2020-06-10 | Disposition: A | Discharge status: Home / Self Care | Payer: Medicare HMO | Source: Ambulatory Visit | Attending: Radiation Oncology | Admitting: Radiation Oncology

## 2020-06-10 ENCOUNTER — Ambulatory Visit: Payer: Medicare HMO

## 2020-06-10 ENCOUNTER — Other Ambulatory Visit: Payer: Self-pay

## 2020-06-10 DIAGNOSIS — C50412 Malignant neoplasm of upper-outer quadrant of left female breast: Secondary | ICD-10-CM | POA: Diagnosis not present

## 2020-06-10 DIAGNOSIS — D0511 Intraductal carcinoma in situ of right breast: Secondary | ICD-10-CM | POA: Diagnosis not present

## 2020-06-11 ENCOUNTER — Ambulatory Visit: Payer: Medicare HMO

## 2020-06-11 ENCOUNTER — Ambulatory Visit
Admission: RE | Admit: 2020-06-11 | Discharge: 2020-06-11 | Disposition: A | Discharge status: Home / Self Care | Payer: Medicare HMO | Source: Ambulatory Visit | Attending: Radiation Oncology | Admitting: Radiation Oncology

## 2020-06-11 DIAGNOSIS — D0511 Intraductal carcinoma in situ of right breast: Secondary | ICD-10-CM | POA: Diagnosis not present

## 2020-06-11 DIAGNOSIS — C50412 Malignant neoplasm of upper-outer quadrant of left female breast: Secondary | ICD-10-CM | POA: Diagnosis not present

## 2020-06-12 ENCOUNTER — Other Ambulatory Visit: Payer: Self-pay

## 2020-06-12 ENCOUNTER — Ambulatory Visit
Admission: RE | Admit: 2020-06-12 | Discharge: 2020-06-12 | Disposition: A | Discharge status: Home / Self Care | Payer: Medicare HMO | Source: Ambulatory Visit | Attending: Radiation Oncology | Admitting: Radiation Oncology

## 2020-06-12 DIAGNOSIS — C50412 Malignant neoplasm of upper-outer quadrant of left female breast: Secondary | ICD-10-CM | POA: Diagnosis not present

## 2020-06-12 DIAGNOSIS — D0511 Intraductal carcinoma in situ of right breast: Secondary | ICD-10-CM | POA: Diagnosis not present

## 2020-06-15 ENCOUNTER — Encounter: Payer: Self-pay | Admitting: *Deleted

## 2020-06-15 ENCOUNTER — Ambulatory Visit
Admission: RE | Admit: 2020-06-15 | Discharge: 2020-06-15 | Disposition: A | Discharge status: Home / Self Care | Payer: Medicare HMO | Source: Ambulatory Visit | Attending: Radiation Oncology | Admitting: Radiation Oncology

## 2020-06-15 ENCOUNTER — Other Ambulatory Visit: Payer: Self-pay

## 2020-06-15 DIAGNOSIS — D0511 Intraductal carcinoma in situ of right breast: Secondary | ICD-10-CM | POA: Diagnosis not present

## 2020-06-15 DIAGNOSIS — C50412 Malignant neoplasm of upper-outer quadrant of left female breast: Secondary | ICD-10-CM | POA: Diagnosis not present

## 2020-06-16 ENCOUNTER — Ambulatory Visit
Admission: RE | Admit: 2020-06-16 | Discharge: 2020-06-16 | Disposition: A | Discharge status: Home / Self Care | Payer: Medicare HMO | Source: Ambulatory Visit | Attending: Radiation Oncology | Admitting: Radiation Oncology

## 2020-06-16 DIAGNOSIS — D0511 Intraductal carcinoma in situ of right breast: Secondary | ICD-10-CM | POA: Diagnosis not present

## 2020-06-16 DIAGNOSIS — C50412 Malignant neoplasm of upper-outer quadrant of left female breast: Secondary | ICD-10-CM | POA: Diagnosis not present

## 2020-06-17 ENCOUNTER — Other Ambulatory Visit: Payer: Self-pay

## 2020-06-17 ENCOUNTER — Ambulatory Visit
Admission: RE | Admit: 2020-06-17 | Discharge: 2020-06-17 | Disposition: A | Discharge status: Home / Self Care | Payer: Medicare HMO | Source: Ambulatory Visit | Attending: Radiation Oncology | Admitting: Radiation Oncology

## 2020-06-17 ENCOUNTER — Ambulatory Visit: Payer: Medicare HMO

## 2020-06-17 DIAGNOSIS — C50412 Malignant neoplasm of upper-outer quadrant of left female breast: Secondary | ICD-10-CM | POA: Diagnosis not present

## 2020-06-17 DIAGNOSIS — D0511 Intraductal carcinoma in situ of right breast: Secondary | ICD-10-CM | POA: Diagnosis not present

## 2020-06-18 ENCOUNTER — Ambulatory Visit: Payer: Medicare HMO

## 2020-06-18 ENCOUNTER — Ambulatory Visit
Admission: RE | Admit: 2020-06-18 | Discharge: 2020-06-18 | Disposition: A | Discharge status: Home / Self Care | Payer: Medicare HMO | Source: Ambulatory Visit | Attending: Radiation Oncology | Admitting: Radiation Oncology

## 2020-06-18 ENCOUNTER — Other Ambulatory Visit: Payer: Self-pay

## 2020-06-18 DIAGNOSIS — C50412 Malignant neoplasm of upper-outer quadrant of left female breast: Secondary | ICD-10-CM | POA: Diagnosis not present

## 2020-06-18 DIAGNOSIS — D0511 Intraductal carcinoma in situ of right breast: Secondary | ICD-10-CM | POA: Diagnosis not present

## 2020-06-19 ENCOUNTER — Ambulatory Visit
Admission: RE | Admit: 2020-06-19 | Discharge: 2020-06-19 | Disposition: A | Discharge status: Home / Self Care | Payer: Medicare HMO | Source: Ambulatory Visit | Attending: Radiation Oncology | Admitting: Radiation Oncology

## 2020-06-19 ENCOUNTER — Ambulatory Visit: Payer: Medicare HMO

## 2020-06-19 DIAGNOSIS — C50412 Malignant neoplasm of upper-outer quadrant of left female breast: Secondary | ICD-10-CM | POA: Diagnosis not present

## 2020-06-19 DIAGNOSIS — D0511 Intraductal carcinoma in situ of right breast: Secondary | ICD-10-CM | POA: Diagnosis not present

## 2020-06-22 ENCOUNTER — Ambulatory Visit
Admission: RE | Admit: 2020-06-22 | Discharge: 2020-06-22 | Disposition: A | Discharge status: Home / Self Care | Payer: Medicare HMO | Source: Ambulatory Visit | Attending: Radiation Oncology | Admitting: Radiation Oncology

## 2020-06-22 ENCOUNTER — Other Ambulatory Visit: Payer: Self-pay

## 2020-06-22 DIAGNOSIS — C50412 Malignant neoplasm of upper-outer quadrant of left female breast: Secondary | ICD-10-CM | POA: Diagnosis not present

## 2020-06-22 DIAGNOSIS — D0511 Intraductal carcinoma in situ of right breast: Secondary | ICD-10-CM | POA: Diagnosis not present

## 2020-06-23 ENCOUNTER — Other Ambulatory Visit: Payer: Self-pay

## 2020-06-23 ENCOUNTER — Ambulatory Visit
Admission: RE | Admit: 2020-06-23 | Discharge: 2020-06-23 | Disposition: A | Discharge status: Home / Self Care | Payer: Medicare HMO | Source: Ambulatory Visit | Attending: Radiation Oncology | Admitting: Radiation Oncology

## 2020-06-23 DIAGNOSIS — D0511 Intraductal carcinoma in situ of right breast: Secondary | ICD-10-CM

## 2020-06-23 DIAGNOSIS — C50412 Malignant neoplasm of upper-outer quadrant of left female breast: Secondary | ICD-10-CM | POA: Diagnosis not present

## 2020-06-23 MED ORDER — ALRA NON-METALLIC DEODORANT (RAD-ONC)
1.0000 "application " | Freq: Once | TOPICAL | Status: AC
  Administered 2020-06-23: 1 via TOPICAL

## 2020-06-23 MED ORDER — RADIAPLEXRX EX GEL: Freq: Once | CUTANEOUS | Status: AC

## 2020-06-24 ENCOUNTER — Ambulatory Visit
Admission: RE | Admit: 2020-06-24 | Discharge: 2020-06-24 | Disposition: A | Discharge status: Home / Self Care | Payer: Medicare HMO | Source: Ambulatory Visit | Attending: Radiation Oncology | Admitting: Radiation Oncology

## 2020-06-24 ENCOUNTER — Ambulatory Visit: Payer: Medicare HMO

## 2020-06-24 DIAGNOSIS — D0511 Intraductal carcinoma in situ of right breast: Secondary | ICD-10-CM | POA: Diagnosis not present

## 2020-06-24 DIAGNOSIS — C50412 Malignant neoplasm of upper-outer quadrant of left female breast: Secondary | ICD-10-CM | POA: Diagnosis not present

## 2020-06-25 ENCOUNTER — Ambulatory Visit: Payer: Medicare HMO

## 2020-06-25 ENCOUNTER — Ambulatory Visit
Admission: RE | Admit: 2020-06-25 | Discharge: 2020-06-25 | Disposition: A | Discharge status: Home / Self Care | Payer: Medicare HMO | Source: Ambulatory Visit | Attending: Radiation Oncology | Admitting: Radiation Oncology

## 2020-06-25 ENCOUNTER — Other Ambulatory Visit: Payer: Self-pay

## 2020-06-25 DIAGNOSIS — C50412 Malignant neoplasm of upper-outer quadrant of left female breast: Secondary | ICD-10-CM | POA: Diagnosis not present

## 2020-06-25 DIAGNOSIS — D0511 Intraductal carcinoma in situ of right breast: Secondary | ICD-10-CM | POA: Diagnosis not present

## 2020-06-26 ENCOUNTER — Ambulatory Visit
Admission: RE | Admit: 2020-06-26 | Discharge: 2020-06-26 | Disposition: A | Discharge status: Home / Self Care | Payer: Medicare HMO | Source: Ambulatory Visit | Attending: Radiation Oncology | Admitting: Radiation Oncology

## 2020-06-26 ENCOUNTER — Ambulatory Visit: Payer: Medicare HMO

## 2020-06-26 DIAGNOSIS — C50412 Malignant neoplasm of upper-outer quadrant of left female breast: Secondary | ICD-10-CM | POA: Diagnosis not present

## 2020-06-26 DIAGNOSIS — D0511 Intraductal carcinoma in situ of right breast: Secondary | ICD-10-CM | POA: Diagnosis not present

## 2020-06-29 ENCOUNTER — Other Ambulatory Visit: Payer: Self-pay

## 2020-06-29 ENCOUNTER — Ambulatory Visit: Payer: Medicare HMO | Admitting: Radiation Oncology

## 2020-06-29 ENCOUNTER — Ambulatory Visit
Admission: RE | Admit: 2020-06-29 | Discharge: 2020-06-29 | Disposition: A | Discharge status: Home / Self Care | Payer: Medicare HMO | Source: Ambulatory Visit | Attending: Radiation Oncology | Admitting: Radiation Oncology

## 2020-06-29 DIAGNOSIS — C50412 Malignant neoplasm of upper-outer quadrant of left female breast: Secondary | ICD-10-CM | POA: Diagnosis not present

## 2020-06-29 DIAGNOSIS — D0511 Intraductal carcinoma in situ of right breast: Secondary | ICD-10-CM | POA: Diagnosis not present

## 2020-06-30 ENCOUNTER — Ambulatory Visit
Admission: RE | Admit: 2020-06-30 | Discharge: 2020-06-30 | Disposition: A | Discharge status: Home / Self Care | Payer: Medicare HMO | Source: Ambulatory Visit | Attending: Radiation Oncology | Admitting: Radiation Oncology

## 2020-06-30 ENCOUNTER — Ambulatory Visit: Payer: Medicare HMO | Admitting: Radiation Oncology

## 2020-06-30 DIAGNOSIS — C50412 Malignant neoplasm of upper-outer quadrant of left female breast: Secondary | ICD-10-CM | POA: Diagnosis not present

## 2020-06-30 DIAGNOSIS — D0511 Intraductal carcinoma in situ of right breast: Secondary | ICD-10-CM | POA: Diagnosis not present

## 2020-07-01 ENCOUNTER — Other Ambulatory Visit: Payer: Self-pay

## 2020-07-01 ENCOUNTER — Ambulatory Visit: Payer: Medicare HMO

## 2020-07-01 ENCOUNTER — Ambulatory Visit
Admission: RE | Admit: 2020-07-01 | Discharge: 2020-07-01 | Disposition: A | Discharge status: Home / Self Care | Payer: Medicare HMO | Source: Ambulatory Visit | Attending: Radiation Oncology | Admitting: Radiation Oncology

## 2020-07-01 DIAGNOSIS — D0511 Intraductal carcinoma in situ of right breast: Secondary | ICD-10-CM | POA: Diagnosis not present

## 2020-07-01 DIAGNOSIS — C50412 Malignant neoplasm of upper-outer quadrant of left female breast: Secondary | ICD-10-CM | POA: Diagnosis not present

## 2020-07-02 ENCOUNTER — Ambulatory Visit
Admission: RE | Admit: 2020-07-02 | Discharge: 2020-07-02 | Disposition: A | Discharge status: Home / Self Care | Payer: Medicare HMO | Source: Ambulatory Visit | Attending: Radiation Oncology | Admitting: Radiation Oncology

## 2020-07-02 ENCOUNTER — Ambulatory Visit: Payer: Medicare HMO

## 2020-07-02 DIAGNOSIS — D0511 Intraductal carcinoma in situ of right breast: Secondary | ICD-10-CM | POA: Diagnosis not present

## 2020-07-02 DIAGNOSIS — C50412 Malignant neoplasm of upper-outer quadrant of left female breast: Secondary | ICD-10-CM | POA: Diagnosis not present

## 2020-07-03 ENCOUNTER — Ambulatory Visit
Admission: RE | Admit: 2020-07-03 | Discharge: 2020-07-03 | Disposition: A | Discharge status: Home / Self Care | Payer: Medicare HMO | Source: Ambulatory Visit | Attending: Radiation Oncology | Admitting: Radiation Oncology

## 2020-07-03 ENCOUNTER — Ambulatory Visit: Payer: Medicare HMO

## 2020-07-03 ENCOUNTER — Other Ambulatory Visit: Payer: Self-pay

## 2020-07-03 DIAGNOSIS — C50412 Malignant neoplasm of upper-outer quadrant of left female breast: Secondary | ICD-10-CM | POA: Diagnosis not present

## 2020-07-03 DIAGNOSIS — D0511 Intraductal carcinoma in situ of right breast: Secondary | ICD-10-CM | POA: Diagnosis not present

## 2020-07-06 ENCOUNTER — Ambulatory Visit
Admission: RE | Admit: 2020-07-06 | Discharge: 2020-07-06 | Disposition: A | Discharge status: Home / Self Care | Payer: Medicare HMO | Source: Ambulatory Visit | Attending: Radiation Oncology | Admitting: Radiation Oncology

## 2020-07-06 ENCOUNTER — Other Ambulatory Visit: Payer: Self-pay

## 2020-07-06 ENCOUNTER — Ambulatory Visit: Payer: Medicare HMO

## 2020-07-06 DIAGNOSIS — D0511 Intraductal carcinoma in situ of right breast: Secondary | ICD-10-CM | POA: Diagnosis not present

## 2020-07-06 DIAGNOSIS — C50412 Malignant neoplasm of upper-outer quadrant of left female breast: Secondary | ICD-10-CM | POA: Diagnosis not present

## 2020-07-07 ENCOUNTER — Ambulatory Visit: Payer: Medicare HMO

## 2020-07-07 ENCOUNTER — Ambulatory Visit: Payer: Medicare HMO | Admitting: Radiation Oncology

## 2020-07-07 ENCOUNTER — Ambulatory Visit
Admission: RE | Admit: 2020-07-07 | Discharge: 2020-07-07 | Disposition: A | Discharge status: Home / Self Care | Payer: Medicare HMO | Source: Ambulatory Visit | Attending: Radiation Oncology | Admitting: Radiation Oncology

## 2020-07-07 DIAGNOSIS — C50412 Malignant neoplasm of upper-outer quadrant of left female breast: Secondary | ICD-10-CM | POA: Diagnosis not present

## 2020-07-07 DIAGNOSIS — D0511 Intraductal carcinoma in situ of right breast: Secondary | ICD-10-CM | POA: Diagnosis not present

## 2020-07-08 ENCOUNTER — Ambulatory Visit
Admission: RE | Admit: 2020-07-08 | Discharge: 2020-07-08 | Disposition: A | Discharge status: Home / Self Care | Payer: Medicare HMO | Source: Ambulatory Visit | Attending: Radiation Oncology | Admitting: Radiation Oncology

## 2020-07-08 ENCOUNTER — Other Ambulatory Visit: Payer: Self-pay

## 2020-07-08 ENCOUNTER — Ambulatory Visit: Payer: Medicare HMO

## 2020-07-08 DIAGNOSIS — D0511 Intraductal carcinoma in situ of right breast: Secondary | ICD-10-CM | POA: Diagnosis not present

## 2020-07-08 DIAGNOSIS — C50412 Malignant neoplasm of upper-outer quadrant of left female breast: Secondary | ICD-10-CM | POA: Diagnosis not present

## 2020-07-09 ENCOUNTER — Ambulatory Visit: Payer: Medicare HMO

## 2020-07-09 ENCOUNTER — Ambulatory Visit
Admission: RE | Admit: 2020-07-09 | Discharge: 2020-07-09 | Disposition: A | Discharge status: Home / Self Care | Payer: Medicare HMO | Source: Ambulatory Visit | Attending: Radiation Oncology | Admitting: Radiation Oncology

## 2020-07-09 DIAGNOSIS — D0511 Intraductal carcinoma in situ of right breast: Secondary | ICD-10-CM | POA: Diagnosis not present

## 2020-07-09 DIAGNOSIS — C50412 Malignant neoplasm of upper-outer quadrant of left female breast: Secondary | ICD-10-CM | POA: Diagnosis not present

## 2020-07-10 ENCOUNTER — Ambulatory Visit: Payer: Medicare HMO

## 2020-07-10 ENCOUNTER — Ambulatory Visit
Admission: RE | Admit: 2020-07-10 | Discharge: 2020-07-10 | Disposition: A | Discharge status: Home / Self Care | Payer: Medicare HMO | Source: Ambulatory Visit | Attending: Radiation Oncology | Admitting: Radiation Oncology

## 2020-07-10 ENCOUNTER — Other Ambulatory Visit: Payer: Self-pay

## 2020-07-10 DIAGNOSIS — D0511 Intraductal carcinoma in situ of right breast: Secondary | ICD-10-CM | POA: Diagnosis not present

## 2020-07-10 DIAGNOSIS — C50412 Malignant neoplasm of upper-outer quadrant of left female breast: Secondary | ICD-10-CM | POA: Diagnosis not present

## 2020-07-14 ENCOUNTER — Ambulatory Visit: Payer: Medicare HMO

## 2020-07-14 ENCOUNTER — Other Ambulatory Visit: Payer: Self-pay

## 2020-07-14 ENCOUNTER — Ambulatory Visit
Admission: RE | Admit: 2020-07-14 | Discharge: 2020-07-14 | Disposition: A | Discharge status: Home / Self Care | Payer: Medicare HMO | Source: Ambulatory Visit | Attending: Radiation Oncology | Admitting: Radiation Oncology

## 2020-07-14 DIAGNOSIS — D0511 Intraductal carcinoma in situ of right breast: Secondary | ICD-10-CM | POA: Diagnosis not present

## 2020-07-14 DIAGNOSIS — C50412 Malignant neoplasm of upper-outer quadrant of left female breast: Secondary | ICD-10-CM | POA: Diagnosis not present

## 2020-07-15 ENCOUNTER — Ambulatory Visit: Payer: Medicare HMO

## 2020-07-15 ENCOUNTER — Ambulatory Visit
Admission: RE | Admit: 2020-07-15 | Discharge: 2020-07-15 | Disposition: A | Discharge status: Home / Self Care | Payer: Medicare HMO | Source: Ambulatory Visit | Attending: Radiation Oncology | Admitting: Radiation Oncology

## 2020-07-15 DIAGNOSIS — D0511 Intraductal carcinoma in situ of right breast: Secondary | ICD-10-CM | POA: Diagnosis not present

## 2020-07-15 DIAGNOSIS — C50412 Malignant neoplasm of upper-outer quadrant of left female breast: Secondary | ICD-10-CM | POA: Diagnosis not present

## 2020-07-16 ENCOUNTER — Ambulatory Visit
Admission: RE | Admit: 2020-07-16 | Discharge: 2020-07-16 | Disposition: A | Discharge status: Home / Self Care | Payer: Medicare HMO | Source: Ambulatory Visit | Attending: Radiation Oncology | Admitting: Radiation Oncology

## 2020-07-16 ENCOUNTER — Other Ambulatory Visit: Payer: Self-pay

## 2020-07-16 DIAGNOSIS — C50412 Malignant neoplasm of upper-outer quadrant of left female breast: Secondary | ICD-10-CM | POA: Diagnosis not present

## 2020-07-16 DIAGNOSIS — D0511 Intraductal carcinoma in situ of right breast: Secondary | ICD-10-CM | POA: Diagnosis not present

## 2020-07-17 ENCOUNTER — Ambulatory Visit
Admission: RE | Admit: 2020-07-17 | Discharge: 2020-07-17 | Disposition: A | Discharge status: Home / Self Care | Payer: Medicare HMO | Source: Ambulatory Visit | Attending: Radiation Oncology | Admitting: Radiation Oncology

## 2020-07-17 ENCOUNTER — Telehealth: Payer: Self-pay | Admitting: *Deleted

## 2020-07-17 DIAGNOSIS — D0511 Intraductal carcinoma in situ of right breast: Secondary | ICD-10-CM | POA: Diagnosis not present

## 2020-07-17 DIAGNOSIS — C50412 Malignant neoplasm of upper-outer quadrant of left female breast: Secondary | ICD-10-CM | POA: Diagnosis not present

## 2020-07-17 NOTE — Telephone Encounter (Signed)
Pt called requesting to "ring the bell" after her final radiation treatment on Monday. Informed pt to go to infusion room and may have 2 guest come with her to "ring the bell". Received verbal understanding

## 2020-07-20 ENCOUNTER — Ambulatory Visit
Admission: RE | Admit: 2020-07-20 | Discharge: 2020-07-20 | Disposition: A | Discharge status: Home / Self Care | Payer: Medicare HMO | Source: Ambulatory Visit | Attending: Radiation Oncology | Admitting: Radiation Oncology

## 2020-07-20 ENCOUNTER — Ambulatory Visit: Payer: Medicare HMO | Attending: Surgery

## 2020-07-20 ENCOUNTER — Other Ambulatory Visit: Payer: Self-pay

## 2020-07-20 DIAGNOSIS — D0511 Intraductal carcinoma in situ of right breast: Secondary | ICD-10-CM | POA: Diagnosis not present

## 2020-07-20 DIAGNOSIS — C50412 Malignant neoplasm of upper-outer quadrant of left female breast: Secondary | ICD-10-CM | POA: Diagnosis not present

## 2020-07-20 DIAGNOSIS — Z483 Aftercare following surgery for neoplasm: Secondary | ICD-10-CM

## 2020-07-20 DIAGNOSIS — Z923 Personal history of irradiation: Secondary | ICD-10-CM

## 2020-07-20 HISTORY — DX: Personal history of irradiation: Z92.3

## 2020-07-20 NOTE — Therapy (Signed)
[place], [place], [address] Phone: [phone]   Fax:  [phone]  Physical Therapy Treatment  Patient Details  Name: Cheyenne Mcdonald MRN: [id number] Date of Birth: Apr 16, 1950 Referring Provider (PT): Dr. [name]   Encounter Date: 07/20/2020   PT End of Session - 07/20/20 1138     Visit Number 5   # unchanged due to screen only   PT Start Time 1110    PT Stop Time 1138    PT Time Calculation (min) 28 min    Activity Tolerance Patient tolerated treatment well    Behavior During Therapy [facility] for tasks assessed/performed             Past Medical History:  Diagnosis Date   Breast cancer ([place])    Obesity    Sleep apnea     Past Surgical History:  Procedure Laterality Date   ABDOMINAL HYSTERECTOMY     BREAST LUMPECTOMY WITH RADIOACTIVE SEED AND SENTINEL LYMPH NODE BIOPSY Left 04/16/2020   Procedure: LEFT BREAST LUMPECTOMY WITH RADIOACTIVE SEED AND LEFT AXILLARY SENTINEL LYMPH NODE BIOPSY;  Surgeon: [name], MD;  Location: [place];  Service: General;  Laterality: Left;   BREAST LUMPECTOMY WITH RADIOACTIVE SEED LOCALIZATION Right 04/16/2020   Procedure: RIGHT BREAST LUMPECTOMY WITH RADIOACTIVE SEED LOCALIZATION;  Surgeon: [name], MD;  Location: [place];  Service: General;  Laterality: Right;   IR IMAGING GUIDED PORT INSERTION  10/18/2019    There were no vitals filed for this visit.   Subjective Assessment - 07/20/20 1111     Subjective Pt returns for her 3 month L-Dex screen.    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 It measures 9 mm and 1.4 cm both located in the upper outer quadrant. Ki67 is 90%; Lt lumpectomy and SLNB and Rt DCIS lumpectomy 04/16/2020                    L-DEX FLOWSHEETS - 07/20/20 1100        L-DEX LYMPHEDEMA SCREENING   Measurement Type Unilateral    L-DEX MEASUREMENT EXTREMITY Upper Extremity    POSITION  Standing    DOMINANT SIDE Left    At Risk Side Right    BASELINE SCORE (UNILATERAL) 3.3    L-DEX SCORE (UNILATERAL) 11.5    VALUE CHANGE (UNILAT) 8.2                                    PT Long Term Goals - 05/29/20 0957       PT LONG TERM GOAL #1   Title Patient will demonstrate she has regained full shoulder ROM and function post op compared to baselines.    Status Achieved      PT LONG TERM GOAL #2   Title Patient will increase left shoulder active flexion to >/= 145 degrees for increased ease reaching overhead.    Status Achieved      PT LONG TERM GOAL #3   Title Patient will increase left shoulder active abduction to >/= 145 degrees for increased ease obtaining radiation positioning.    Status Achieved      PT LONG TERM GOAL #4   Title Patient will report she feels a >/= 50% improvement in her left breast swelling.  Status Achieved      PT LONG TERM GOAL #5   Title Patient will improve her DASH score to be zero (0) for improved overall arm function back to baseline.    Status Achieved      PT LONG TERM GOAL #6   Title Pt will be ind with strength ABC for the left UE    Status Achieved                   Plan - 07/20/20 1206     Clinical Impression Statement Pt returns for her 3 month L-Dex screen after having just finished 34 radiation, with boost at end, treatments today. Her change from baseline of 8.2 is outside of WNLs meaning that pt has developed subclinical lymphedema. She was measured for a compression sleeve and issued a size IV, comfort, sleeve today and instructed to begin wearing this 10 hrs/day, daily for next month until she can return for another SOZO screen to determine if this has reversed. Pt was able to verbalize good understanding of all instructions today, including not to sleep in her sleeve,  and is agreeable to plan.    PT Next Visit Plan Pt to return in 1 month for L-Dex screen to determine if her subclinical lymphedema has reversed' if so resume every 3 month L-Dex screens    Consulted and Agree with Plan of Care Patient             Patient will benefit from skilled therapeutic intervention in order to improve the following deficits and impairments:     Visit Diagnosis: Aftercare following surgery for neoplasm     Problem List Patient Active Problem List   Diagnosis Date Noted   Ductal carcinoma in situ (DCIS) of right breast 05/06/2020   Genetic testing 11/04/2019   Port-A-Cath in place 10/22/2019   Malignant neoplasm of upper-outer quadrant of left breast in female, estrogen receptor negative ([place]) 10/03/2019   Unspecified arthropathy, ankle and foot 09/24/2009   HALLUX RIGIDUS 09/24/2009    Otelia Limes, PTA 07/20/2020, 12:10 PM  [place], [place], [address] Phone: [phone]   Fax:  [phone]  Name: Cheyenne Mcdonald MRN: [id number] Date of Birth: 1950-09-29

## 2020-07-23 ENCOUNTER — Other Ambulatory Visit: Payer: Self-pay

## 2020-07-23 ENCOUNTER — Inpatient Hospital Stay: Payer: Medicare HMO | Attending: Oncology | Admitting: Oncology

## 2020-07-23 VITALS — BP 122/68 | HR 81 | Temp 97.9°F | Resp 19 | Ht 63.5 in | Wt 200.5 lb

## 2020-07-23 DIAGNOSIS — Z923 Personal history of irradiation: Secondary | ICD-10-CM | POA: Insufficient documentation

## 2020-07-23 DIAGNOSIS — D0511 Intraductal carcinoma in situ of right breast: Secondary | ICD-10-CM | POA: Diagnosis not present

## 2020-07-23 DIAGNOSIS — Z853 Personal history of malignant neoplasm of breast: Secondary | ICD-10-CM | POA: Diagnosis not present

## 2020-07-23 DIAGNOSIS — E669 Obesity, unspecified: Secondary | ICD-10-CM | POA: Insufficient documentation

## 2020-07-23 DIAGNOSIS — Z171 Estrogen receptor negative status [ER-]: Secondary | ICD-10-CM | POA: Diagnosis not present

## 2020-07-23 DIAGNOSIS — Z9221 Personal history of antineoplastic chemotherapy: Secondary | ICD-10-CM | POA: Diagnosis not present

## 2020-07-23 DIAGNOSIS — C50412 Malignant neoplasm of upper-outer quadrant of left female breast: Secondary | ICD-10-CM

## 2020-07-23 DIAGNOSIS — Z170 Estrogen receptor positive status [ER+]: Secondary | ICD-10-CM | POA: Insufficient documentation

## 2020-07-23 DIAGNOSIS — Z6834 Body mass index (BMI) 34.0-34.9, adult: Secondary | ICD-10-CM | POA: Diagnosis not present

## 2020-07-23 DIAGNOSIS — R011 Cardiac murmur, unspecified: Secondary | ICD-10-CM | POA: Diagnosis not present

## 2020-07-23 NOTE — Progress Notes (Signed)
[facility]  [phone] [phone]     ID: Cheyenne Mcdonald DOB: 07-17-1950  MR#: [id number]  UMP#:[id number]  Patient Care Team: Trey Sailors, PA as PCP - General (Physician Assistant) Servando Salina, MD as Consulting Physician (Obstetrics and Gynecology) Mauro Kaufmann, RN as Oncology Nurse Navigator Rockwell Germany, RN as Oncology Nurse Navigator [name], MD as Consulting Physician (General Surgery) Maryclaire Stoecker, Virgie Dad, MD as Consulting Physician (Oncology) Gery Pray, MD as Consulting Physician (Radiation Oncology) Dimas Aguas, MD as Consulting Physician (Nephrology) Chauncey Cruel, MD OTHER MD:   CHIEF COMPLAINT: 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    Since her last visit, she underwent bilateral lumpectomies on 04/16/2020 under Dr. Ninfa Linden. Pathology from the procedure ([id number]) showed: Right Breast - ductal carcinoma in situ, low grade - carcinoma focally <1 mm from posterior and medial margins  2. Left Breast  - no residual invasive carcinoma status post neoadjuvant treatment 3. Left Axillary Lymph Nodes (3)  - negative for carcinoma (0/3)  She was referred back to Dr. Sondra Come on 05/06/2020 to review radiation therapy. She subsequently received treatment to the bilateral breasts from 06/01/2020 through 07/20/2020.   REVIEW OF SYSTEMS: Cheyenne Mcdonald did remarkably well with her surgery, although she did develop a left seroma.  She is struggled with this for a while.  Finally she tells me physical therapy and a compression bra have greatly helped.  It did leave her with a slight indentation in the left breast which she wanted me to look at.  She then underwent radiation and did very well with that except for the last 2  weeks.  She had fatigue hyperpigmentation desquamation and pain particularly in the left armpit area.  She has now resumed her walking program and tries to get 10,000 steps most days.  She would like her port removed.  A detailed review of systems today was otherwise stable.   COVID 19 VACCINATION STATUS: Status post Pfizer x2 with booster October 2021   HISTORY OF CURRENT ILLNESS: From the original intake note:  Cheyenne Mcdonald presented for her routine mammography with a palpable left breast lump. She underwent bilateral diagnostic mammography with tomography and left breast ultrasonography at [facility] on 09/13/2019 showing: breast density category B; two adjacent masses in left breast at 1 o'clock, spanning approximately 3.3 cm; one indeterminate left axillary lymph node with 0.4 cm cortical bulge; no evidence right breast malignancy.  Accordingly on 09/27/2019 she proceeded to biopsy of the left breast areas in question. The pathology from this procedure ([id number]) showed: invasive ductal carcinoma, grade 3, present in both of the adjacent masses. Prognostic indicators significant for: estrogen receptor, 0% negative and progesterone receptor, 0% negative. Proliferation marker Ki67 at 90%. HER2 equivocal by immunohistochemistry (2+), but negative by fluorescent in situ hybridization with a signals ratio 2.2 and number per cell 3.3. Additional analysis of more cells showed a signals ratio 2.05 and number per cell 3.13.  The biopsied lymph node was negative for carcinoma.  This was felt to be concordant  The patient's subsequent history is as detailed below.   PAST MEDICAL HISTORY: Past Medical History:  Diagnosis Date   Breast cancer ([place])    Obesity    Sleep apnea   heart murmur (since childhood)   PAST SURGICAL HISTORY: Past Surgical  History:  Procedure Laterality Date   ABDOMINAL HYSTERECTOMY     BREAST LUMPECTOMY WITH RADIOACTIVE SEED AND SENTINEL LYMPH NODE BIOPSY Left  04/16/2020   Procedure: LEFT BREAST LUMPECTOMY WITH RADIOACTIVE SEED AND LEFT AXILLARY SENTINEL LYMPH NODE BIOPSY;  Surgeon: [name], MD;  Location: [facility];  Service: General;  Laterality: Left;   BREAST LUMPECTOMY WITH RADIOACTIVE SEED LOCALIZATION Right 04/16/2020   Procedure: RIGHT BREAST LUMPECTOMY WITH RADIOACTIVE SEED LOCALIZATION;  Surgeon: [name], MD;  Location: [facility];  Service: General;  Laterality: Right;   IR IMAGING GUIDED PORT INSERTION  10/18/2019    FAMILY HISTORY: Family History  Problem Relation Age of Onset   Breast cancer Cousin 78       maternal cousin; bilateral    Her father died at age 71, cause of death unknown. Her mother is age 88 as of 09/2019. Cheyenne Mcdonald has two brothers (and no sisters). She reports one cousin with breast cancer at age 58.   GYNECOLOGIC HISTORY:  No LMP recorded. Patient has had a hysterectomy. Menarche: 70 years old Age at first live birth: 70 years old [place] P 2 LMP 1990 Contraceptive: used for maybe 20 years HRT: never used  Hysterectomy? Yes, 1990 BSO? 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 Husband Cheyenne Mcdonald is retired Nature conservation officer and then retired Civil Service fast streamer.  At home is just the 2 of them. Cheyenne Mcdonald, age 64, works in [place] entry in [employer]. Cheyenne Mcdonald, age 31, has a college degree but works for [employer] in [employer]. Cheyenne Mcdonald has four grandchildren. She attends [employer].    ADVANCED DIRECTIVES: In place   HEALTH MAINTENANCE: Social History   Tobacco Use   Smoking status: Never   Smokeless tobacco: Never  Substance Use Topics   Alcohol use: Never   Drug use: Never     Colonoscopy: 2018 (Dr. Collene Mares)  PAP: approx. 2013  Bone density: 2016, "normal"   Allergies  Allergen Reactions   Other Itching    shellfish   Shellfish Allergy Itching    Current Outpatient Medications  Medication Sig  Dispense Refill   Cholecalciferol (VITAMIN D-3) 125 MCG (5000 UT) TABS 1 cap(s)     oxyCODONE (OXY IR/ROXICODONE) 5 MG immediate release tablet Take 1 tablet (5 mg total) by mouth every 6 (six) hours as needed for moderate pain or severe pain. 25 tablet 0   No current facility-administered medications for this visit.    OBJECTIVE: African American woman who appears younger than stated age  51:   07/23/20 1539  BP: 122/68  Pulse: 81  Resp: 19  Temp: 97.9 F (36.6 C)  SpO2: 100%     Body mass index is 34.96 kg/m.   Wt Readings from Last 3 Encounters:  07/23/20 200 lb 8 oz (90.9 kg)  05/06/20 204 lb 3.2 oz (92.6 kg)  04/16/20 201 lb 15.1 oz (91.6 kg)      ECOG FS:1 - Symptomatic but completely ambulatory  Sclerae unicteric, EOMs intact Wearing a mask No cervical or supraclavicular adenopathy Lungs no rales or rhonchi Heart regular rate and rhythm Abd soft, nontender, positive bowel sounds MSK no focal spinal tenderness, no upper extremity lymphedema Neuro: nonfocal, well oriented, appropriate affect Breasts: The right breast is status post lumpectomy and radiation.  There is no evidence of local recurrence.  The left breast is status post lumpectomy and radiation.  There is more  hyperpigmentation and some desquamation regularly in the left axilla area.  There is some induration and indentation in the upper outer quadrant.  There is no evidence of residual recurrent disease.  Both axillae are benign.   LAB RESULTS:  CMP     Component Value Date/Time   NA 137 03/03/2020 0839   K 3.9 03/03/2020 0839   CL 106 03/03/2020 0839   CO2 22 03/03/2020 0839   GLUCOSE 101 (H) 03/03/2020 0839   BUN 14 03/03/2020 0839   CREATININE 1.05 (H) 03/03/2020 0839   CALCIUM 9.0 03/03/2020 0839   PROT 6.7 03/03/2020 0839   ALBUMIN 4.0 03/03/2020 0839   AST 13 (L) 03/03/2020 0839   ALT 11 03/03/2020 0839   ALKPHOS 62 03/03/2020 0839   BILITOT 0.4 03/03/2020 0839   GFRNONAA 57 (L)  03/03/2020 0839   GFRAA 58 (L) 10/09/2019 0806    No results found for: TOTALPROTELP, ALBUMINELP, A1GS, A2GS, BETS, BETA2SER, GAMS, MSPIKE, SPEI  Lab Results  Component Value Date   WBC 2.4 (L) 03/03/2020   NEUTROABS 1.5 (L) 03/03/2020   HGB 10.5 (L) 03/03/2020   HCT 29.4 (L) 03/03/2020   MCV 97.4 03/03/2020   PLT 144 (L) 03/03/2020    No results found for: LABCA2  No components found for: OBSJGG836  No results for input(s): INR in the last 168 hours.  No results found for: LABCA2  No results found for: OQH476  No results found for: LYY503  No results found for: TWS568  No results found for: CA2729  No components found for: HGQUANT  No results found for: CEA1 / No results found for: CEA1   No results found for: AFPTUMOR  No results found for: CHROMOGRNA  No results found for: KPAFRELGTCHN, LAMBDASER, KAPLAMBRATIO (kappa/lambda light chains)  No results found for: HGBA, HGBA2QUANT, HGBFQUANT, HGBSQUAN (Hemoglobinopathy evaluation)   No results found for: LDH  No results found for: IRON, TIBC, IRONPCTSAT (Iron and TIBC)  No results found for: FERRITIN  Urinalysis No results found for: COLORURINE, APPEARANCEUR, LABSPEC, PHURINE, GLUCOSEU, HGBUR, BILIRUBINUR, KETONESUR, PROTEINUR, UROBILINOGEN, NITRITE, LEUKOCYTESUR   STUDIES: No results found.   ELIGIBLE FOR AVAILABLE RESEARCH PROTOCOL: AET  ASSESSMENT: 70 y.o. [place] woman status post left breast upper outer quadrant biopsy 09/27/2019 for a clinically T1-T2 N0, stage IA- IIA invasive ductal carcinoma, grade 3, triple negative, with an MIB-1 of 90%.  (1) genetics testing 10/30/2019 through the [facility] Common Hereditary Cancers Panel found no deleterious mutations in APC, ATM, AXIN2, BARD1, BMPR1A, BRCA1, BRCA2, BRIP1, CDH1, CDK4, CDKN2A (p14ARF), CDKN2A (p16INK4a), CHEK2, CTNNA1, DICER1, EPCAM (Deletion/duplication testing only), GREM1 (promoter region deletion/duplication testing only), KIT, MEN1,  MLH1, MSH2, MSH3, MSH6, MUTYH, NBN, NF1, NHTL1, PALB2, PDGFRA, PMS2, POLD1, POLE, PTEN, RAD50, RAD51C, RAD51D, RNF43, SDHB, SDHC, SDHD, SMAD4, SMARCA4. STK11, TP53, TSC1, TSC2, and VHL.  The following genes were evaluated for sequence changes only: SDHA and HOXB13 c.251G>A variant only.  LEFT BREAST: (2) neoadjuvant chemotherapy consisting of doxorubicin and cyclophosphamide in dose dense fashion x4 starting 10/22/2019, completed 12/03/2019,followed by weekly carboplatin and paclitaxel x12 started 12/17/2019, last dose 02/25/2020  (a) echo 10/17/2019 shows an ejection fraction in the 60-65% range  (b) final 2 doses of carboplatinum/paclitaxel omitted with grade 1 neuropathy  (3) status post left lumpectomy and sentinel lymph node sampling 04/16/2020 for a ypT0 ypN0, complete pathologic response  (a) a total of 3 left axillary lymph nodes were removed  RIGHT BREAST: (4) multicentric biopsy x2 on the right (contralateral) breast 02/27/2020  shows ductal carcinoma in situ, estrogen and progesterone receptor strongly positive  (5) right lumpectomy 04/16/2020 shows a residual 1.4 cm of ductal carcinoma in situ, grade 1, with close but negative margins  (6) to both breasts completed 07/20/2020  (7) considering antiestrogens   PLAN: Cheyenne Mcdonald did very well with her surgery and had the best possible response to her neoadjuvant chemotherapy namely a complete pathologic response.  She understands that as far as the invasive disease is concerned this predicts very good long-term prognosis.  She does not need any further treatment for the left-sided cancer.  On the right she had a noninvasive disease which is estrogen receptor positive.  She has undergone radiation for that and her risk of recurrence locally is very low.  Today we discussed antiestrogens in detail.  She understands the difference between tamoxifen and anastrozole in detail. She understands that anastrozole and the aromatase inhibitors in  general work by blocking estrogen production. Accordingly vaginal dryness, decrease in bone density, and of course hot flashes can result. The aromatase inhibitors can also negatively affect the cholesterol profile, although that is a minor effect. One out of 5 women on aromatase inhibitors we will feel "old and achy". This arthralgia/myalgia syndrome, which resembles fibromyalgia clinically, does resolve with stopping the medications. Accordingly this is not a reason to not try an aromatase inhibitor but it is a frequent reason to stop it (in other words 20% of women will not be able to tolerate these medications).  Tamoxifen on the other hand does not block estrogen production. It does not "take away a woman's estrogen". It blocks the estrogen receptor in breast cells. Like anastrozole, it can also cause hot flashes. As opposed to anastrozole, tamoxifen has many estrogen-like effects. It is technically an estrogen receptor modulator. This means that in some tissues tamoxifen works like estrogen-- for example it helps strengthen the bones. It tends to improve the cholesterol profile. It can cause thickening of the endometrial lining, and even endometrial polyps or rarely cancer of the uterus.(The risk of uterine cancer due to tamoxifen is one additional cancer per thousand women year). It can cause vaginal wetness or stickiness. It can cause blood clots through this estrogen-like effect--the risk of blood clots with tamoxifen is exactly the same as with birth control pills or hormone replacement.  Neither of these agents causes mood changes or weight gain, despite the popular belief that they can have these side effects. We have data from studies comparing either of these drugs with placebo, and in those cases the control group had the same amount of weight gain and depression as the group that took the drug.  Of course antiestrogens will have no effect on the left-sided cancer and while they will decrease  the risk of local recurrence on the right at that effect is small.  The main reason she may wish to proceed with antiestrogens is preventive.  She had bilateral breast cancers and remains at risk of future breast cancers in either breast.  At this point she is too close to adjuvant radiation to consider starting antiestrogens.  I will contact her in about 6 weeks for a decision whether she wishes to try 1 of these 2 agents and if so which.  In any case she will return to see me in mid November, after her new baseline mammography is up-to-date.  Total encounter time 40 minutes.Cheyenne Jews C. Atziri Zubiate, MD 07/23/2020 5:41 PM Medical Oncology and Hematology [facility] [address], [place] [address] Tel. [phone]    Fax. [phone]   This document serves as a record of services personally performed by Lurline Del, MD. It was created on his behalf by Wilburn Mylar, a trained medical scribe. The creation of this record is based on the scribe's personal observations and the provider's statements to them.   I, Lurline Del MD, have reviewed the above documentation for accuracy and completeness, and I agree with the above.   *Total Encounter Time as defined by the Centers for Medicare and Medicaid Services includes, in addition to the face-to-face time of a patient visit (documented in the note above) non-face-to-face time: obtaining and reviewing outside history, ordering and reviewing medications, tests or procedures, care coordination (communications with other health care professionals or caregivers) and documentation in the medical record.

## 2020-07-24 ENCOUNTER — Other Ambulatory Visit: Payer: Self-pay | Admitting: Surgery

## 2020-07-27 ENCOUNTER — Telehealth: Payer: Self-pay | Admitting: Oncology

## 2020-07-27 NOTE — Telephone Encounter (Signed)
Scheduled appointment per 07/14 los. Left message.   

## 2020-08-06 ENCOUNTER — Telehealth: Payer: Self-pay | Admitting: *Deleted

## 2020-08-06 NOTE — Telephone Encounter (Signed)
Per MD review- antivirals not recommended at this time.  This RN called pt and discussed.  She understands to call if symptoms worsen but presently antivirals not recommended.

## 2020-08-06 NOTE — Telephone Encounter (Signed)
Pt called to this RN to state she has tested positive for Covid " but I feel good "- she is inquiring if she should get the antiviral therapy.  She states she had chills and mild sore throat 7/26 - upon waking on Wed she had headache and body aches- which is when she tested per home test with positive results.  She took tylenol and today she states headache is gone and symptoms are very mild " but my mom is concerned and thought I might need the antivirals "  Pt states she has been vaccinated x 2 with additional 1 booster.  Pt completed radiation therapy earlier this month post lumpectomy with prior therapy of  neoadjuvant chemotherapy consisting of doxorubicin and cyclophosphamide in dose dense fashion x4 starting 10/22/2019, completed 12/03/2019,followed by weekly carboplatin and paclitaxel x12 started 12/17/2019, last dose 02/25/2020.  This note will be forwarded to provider for further recommendations.

## 2020-08-17 ENCOUNTER — Telehealth: Payer: Self-pay | Admitting: *Deleted

## 2020-08-17 NOTE — Telephone Encounter (Signed)
CALLED PATIENT TO ASK ABOUT RESCHEDULING FU ON 08-27-20 DUE TO DR. KINARD BEING IN THE OR, RESCHEDULED FOR 08-31-20 @ 3:45 PM, PATIENT AGREED TO DATE AND TIME

## 2020-08-24 ENCOUNTER — Ambulatory Visit: Payer: Medicare HMO | Attending: Surgery | Admitting: Physical Therapy

## 2020-08-24 ENCOUNTER — Other Ambulatory Visit: Payer: Self-pay

## 2020-08-24 DIAGNOSIS — Z483 Aftercare following surgery for neoplasm: Secondary | ICD-10-CM | POA: Insufficient documentation

## 2020-08-24 NOTE — Therapy (Signed)
[place], [place], [address] Phone: [phone]   Fax:  [phone]  Physical Therapy Treatment  Patient Details  Name: Cheyenne Mcdonald MRN: [id number] Date of Birth: [birth year] Referring Provider (PT): Dr. [name]   Encounter Date: 08/24/2020   PT End of Session - 08/24/20 1157     Visit Number 5    Number of Visits 10    PT Start Time 1110    PT Stop Time 1125    PT Time Calculation (min) 15 min    Activity Tolerance Patient tolerated treatment well    Behavior During Therapy [facility] for tasks assessed/performed             Past Medical History:  Diagnosis Date   Breast cancer ([place])    Obesity    Sleep apnea     Past Surgical History:  Procedure Laterality Date   ABDOMINAL HYSTERECTOMY     BREAST LUMPECTOMY WITH RADIOACTIVE SEED AND SENTINEL LYMPH NODE BIOPSY Left 04/16/2020   Procedure: LEFT BREAST LUMPECTOMY WITH RADIOACTIVE SEED AND LEFT AXILLARY SENTINEL LYMPH NODE BIOPSY;  Surgeon: [name], MD;  Location: [facility];  Service: General;  Laterality: Left;   BREAST LUMPECTOMY WITH RADIOACTIVE SEED LOCALIZATION Right 04/16/2020   Procedure: RIGHT BREAST LUMPECTOMY WITH RADIOACTIVE SEED LOCALIZATION;  Surgeon: [name], MD;  Location: [facility];  Service: General;  Laterality: Right;   IR IMAGING GUIDED PORT INSERTION  10/18/2019    There were no vitals filed for this visit.   Subjective Assessment - 08/24/20 1125     Subjective Pt here for a recheck after scoring above safe range on SOZO 1 month ago. She has been wearing a compression sleeve every day for the past 30 days.    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 It measures 9 mm and 1.4 cm both located in the upper outer quadrant. Ki67 is 90%; Lt lumpectomy and SLNB and  Rt DCIS lumpectomy 04/16/2020                    L-DEX FLOWSHEETS - 08/24/20 1100       L-DEX LYMPHEDEMA SCREENING   Measurement Type Unilateral    L-DEX MEASUREMENT EXTREMITY Upper Extremity    POSITION  Standing    DOMINANT SIDE Left    At Risk Side Right    BASELINE SCORE (UNILATERAL) 3.3    L-DEX SCORE (UNILATERAL) 6    VALUE CHANGE (UNILAT) 2.7                                    PT Long Term Goals - 05/29/20 0957       PT LONG TERM GOAL #1   Title Patient will demonstrate she has regained full shoulder ROM and function post op compared to baselines.    Status Achieved      PT LONG TERM GOAL #2   Title Patient will increase left shoulder active flexion to >/= 145 degrees for increased ease reaching overhead.    Status Achieved      PT LONG TERM GOAL #3   Title Patient will increase left shoulder active abduction to >/= 145 degrees for increased ease obtaining radiation positioning.    Status Achieved      PT LONG TERM  GOAL #4   Title Patient will report she feels a >/= 50% improvement in her left breast swelling.    Status Achieved      PT LONG TERM GOAL #5   Title Patient will improve her DASH score to be zero (0) for improved overall arm function back to baseline.    Status Achieved      PT LONG TERM GOAL #6   Title Pt will be ind with strength ABC for the left UE    Status Achieved                   Plan - 08/24/20 1157     Clinical Impression Statement Continue L-Dex screens             Patient will benefit from skilled therapeutic intervention in order to improve the following deficits and impairments:     Visit Diagnosis: Aftercare following surgery for neoplasm     Problem List Patient Active Problem List   Diagnosis Date Noted   Ductal carcinoma in situ (DCIS) of right breast 05/06/2020   Genetic testing 11/04/2019   Port-A-Cath in place 10/22/2019   Malignant neoplasm of upper-outer  quadrant of left breast in female, estrogen receptor negative ([place]) 10/03/2019   Unspecified arthropathy, ankle and foot 09/24/2009   HALLUX RIGIDUS 09/24/2009   Annia Friendly, PT 08/24/20 11:58 AM   [place], [place], [address] Phone: [phone]   Fax:  [phone]  Name: Cheyenne Mcdonald MRN: [id number] Date of Birth: September 02, 1950

## 2020-08-25 ENCOUNTER — Other Ambulatory Visit: Payer: Self-pay

## 2020-08-25 ENCOUNTER — Encounter (HOSPITAL_BASED_OUTPATIENT_CLINIC_OR_DEPARTMENT_OTHER): Payer: Self-pay | Admitting: Surgery

## 2020-08-25 DIAGNOSIS — N183 Chronic kidney disease, stage 3 unspecified: Secondary | ICD-10-CM | POA: Diagnosis not present

## 2020-08-25 DIAGNOSIS — I10 Essential (primary) hypertension: Secondary | ICD-10-CM | POA: Diagnosis not present

## 2020-08-25 DIAGNOSIS — N390 Urinary tract infection, site not specified: Secondary | ICD-10-CM | POA: Diagnosis not present

## 2020-08-25 DIAGNOSIS — N182 Chronic kidney disease, stage 2 (mild): Secondary | ICD-10-CM | POA: Diagnosis not present

## 2020-08-25 DIAGNOSIS — E559 Vitamin D deficiency, unspecified: Secondary | ICD-10-CM | POA: Diagnosis not present

## 2020-08-26 ENCOUNTER — Encounter: Payer: Self-pay | Admitting: Radiology

## 2020-08-27 ENCOUNTER — Ambulatory Visit: Payer: Medicare HMO | Admitting: Radiation Oncology

## 2020-08-27 MED ORDER — CHLORHEXIDINE GLUCONATE CLOTH 2 % EX PADS: 6.0000 | MEDICATED_PAD | Freq: Once | CUTANEOUS | Status: DC

## 2020-08-27 NOTE — Progress Notes (Signed)
      Enhanced Recovery after Surgery for Orthopedics Enhanced Recovery after Surgery is a protocol used to improve the stress on your body and your recovery after surgery.  Patient Instructions  The night before surgery:  No food after midnight. ONLY clear liquids after midnight  The day of surgery (if you do NOT have diabetes):  Drink ONE (1) Pre-Surgery Clear Ensure as directed.   This drink was given to you during your hospital  pre-op appointment visit. The pre-op nurse will instruct you on the time to drink the  Pre-Surgery Ensure depending on your surgery time. Finish the drink at the designated time by the pre-op nurse.  Nothing else to drink after completing the  Pre-Surgery Clear Ensure.  The day of surgery (if you have diabetes): Drink ONE (1) Gatorade 2 (G2) as directed. This drink was given to you during your hospital  pre-op appointment visit.  The pre-op nurse will instruct you on the time to drink the   Gatorade 2 (G2) depending on your surgery time. Color of the Gatorade may vary. Red is not allowed. Nothing else to drink after completing the  Gatorade 2 (G2).  Surgical soap given to patient, instructions given, patient verbalized understanding.           If you have questions, please contact your surgeon's office. 

## 2020-08-28 NOTE — Progress Notes (Incomplete)
  Radiation Oncology         [phone] ________________________________  Patient Name: Cheyenne Mcdonald MRN: [id number] DOB: 04-17-1950 Referring Physician: Benito Mccreedy (Profile Not Attached) Date of Service: 07/20/2020 [facility], [place]  End Of Treatment Note  Diagnoses: C50.412-Malignant neoplasm of upper-outer quadrant of left female breast D05.11-Intraductal carcinoma in situ of right breast  Cancer Staging: Stage T1c, N0, M0, Left Breast UOQ, Invasive Ductal Carcinoma, ER- / PR- / Her2-, Grade 3   Stage 0 Right Breast, DCIS, ER+ / PR+, Grade 1, close surgical margins  Intent: Curative  Radiation Treatment Dates: 06/01/2020 through 07/20/2020 Site Technique Total Dose (Gy) Dose per Fx (Gy) Completed Fx Beam Energies  Breast, Left: Breast_Lt 3D 50.4/50.4 1.8 28/28 10X  Breast, Left: Breast_Lt_Bst 3D 10/10 2 5/5 6X, 10X  Breast, Right: Breast_Rt 3D 50.4/50.4 1.8 28/28 10X  Breast, Right: Breast_Rt_Bst 3D 12/12 2 6/6 6X, 10X   Narrative: The patient tolerated radiation therapy relatively well. She denies pain and continues to have a good energy level. Patient continues to have itchy skin to bilateral breast area, as well as some notable hyperpigmentation bilaterally. Open areas to the right axilla have healed; she continues to use neosporin and radiaplex. Healing desquamation additionally noted in the left axilla.   Plan: The patient will follow-up with radiation oncology in one month .  ________________________________________________ -----------------------------------  Blair Promise, PhD, MD  This document serves as a record of services personally performed by Gery Pray, MD. It was created on his behalf by Roney Mans, a trained medical scribe. The creation of this record is based on the scribe's personal observations and the provider's statements to them. This document has been checked and approved by the attending provider.

## 2020-08-28 NOTE — Progress Notes (Signed)
Radiation Oncology         [phone] ________________________________  Name: Cheyenne Mcdonald MRN: [id number]  Date: 08/31/2020  DOB: 08-16-1950  Follow-Up Visit Note  CC: Trey Sailors, [place]  Benito Mccreedy, MD    ICD-10-CM   1. Malignant neoplasm of upper-outer quadrant of left breast in female, estrogen receptor negative ([place])  C50.412    Z17.1     2. Ductal carcinoma in situ (DCIS) of right breast  D05.11       Diagnosis:  Stage T1c, N0, M0, Left Breast UOQ, Invasive Ductal Carcinoma, ER- / PR- / Her2-, Grade 3   Stage 0 Right Breast, DCIS, ER+ / PR+, Grade 1, close surgical margins  Interval Since Last Radiation:  1 month and 11 days  Intent: Curative  Radiation Treatment Dates: 06/01/2020 through 07/20/2020 Site Technique Total Dose (Gy) Dose per Fx (Gy) Completed Fx Beam Energies  Breast, Left: Breast_Lt 3D 50.4/50.4 1.8 28/28 10X  Breast, Left: Breast_Lt_Bst 3D 10/10 2 5/5 6X, 10X  Breast, Right: Breast_Rt 3D 50.4/50.4 1.8 28/28 10X  Breast, Right: Breast_Rt_Bst 3D 12/12 2 6/6 6X, 10X   Narrative:  The patient returns today for routine follow-up. She tolerated her recent radiation treatment relatively well. At the end of her treatment, the patient was seen to have itchy skin to both breasts, as well as some notable hyperpigmentation bilaterally. Previously open areas noted to her right axilla healed well by the end of treatment.           The patient otherwise has had no significant interval history since she was last seen.    She reports her skin healing very quickly.  She denies any pain along either breast any further itching or discomfort.  She will meet with Dr. Jana Hakim in the near future to discuss adjuvant hormonal therapy.              Allergies:  is allergic to shellfish allergy.  Meds: Current Outpatient Medications  Medication Sig Dispense Refill   Cholecalciferol (VITAMIN D-3) 125 MCG (5000 UT) TABS 1 cap(s)     allopurinol (ZYLOPRIM) 100 MG  tablet Take 100 mg by mouth daily. (Patient not taking: Reported on 08/31/2020)     No current facility-administered medications for this encounter.   Facility-Administered Medications Ordered in Other Encounters  Medication Dose Route Frequency Provider Last Rate Last Admin   Chlorhexidine Gluconate Cloth 2 % PADS 6 each  6 each Topical Once [name], MD       And   Chlorhexidine Gluconate Cloth 2 % PADS 6 each  6 each Topical Once [name], MD        Physical Findings: The patient is in no acute distress. Patient is alert and oriented.  height is $RemoveB'5\' 3"'vpLENxxc$  (1.6 m) and weight is 200 lb 6.4 oz (90.9 kg). Her temperature is 97.2 F (36.2 C) (abnormal). Her blood pressure is 127/75 and her pulse is 74. Her respiration is 18 and oxygen saturation is 100%. .  No significant changes. Lungs are clear to auscultation bilaterally. Heart has regular rate and rhythm. No palpable cervical, supraclavicular, or axillary adenopathy. Abdomen soft, non-tender, normal bowel sounds.  The right breast area is healed very nicely.  She has minimal hyperpigmentation changes and minimal edema.  No dominant mass appreciated in the breast nipple discharge or bleeding.  The left breast is also healed well.  She does have some scar tissue associated with her lumpectomy scars has been noticed on previous  occasions.  She has minimal edema in the left breast.  Minimal hyperpigmentation changes.  No palpable or visible signs of recurrence.   Lab Findings: Lab Results  Component Value Date   WBC 2.4 (L) 03/03/2020   HGB 10.5 (L) 03/03/2020   HCT 29.4 (L) 03/03/2020   MCV 97.4 03/03/2020   PLT 144 (L) 03/03/2020    Radiographic Findings: No results found.  Impression:  Stage T1c, N0, M0, Left Breast UOQ, Invasive Ductal Carcinoma, ER- / PR- / Her2-, Grade 3   Stage 0 Right Breast, DCIS, ER+ / PR+, Grade 1, close surgical margins  The patient is recovering from the effects of radiation.  She has  recovered very well from her treatment and tolerated her treatments well in addition.  Plan: As needed follow-up in radiation oncology.  Patient will continue close follow-up medical oncology and surgery.  The patient will be seen in the next few weeks for further discussion of adjuvant hormonal therapy primarily as a preventative medication.    ____________________________________  Blair Promise, PhD, MD   This document serves as a record of services personally performed by Gery Pray, MD. It was created on his behalf by Roney Mans, a trained medical scribe. The creation of this record is based on the scribe's personal observations and the provider's statements to them. This document has been checked and approved by the attending provider.

## 2020-08-31 ENCOUNTER — Other Ambulatory Visit: Payer: Self-pay

## 2020-08-31 ENCOUNTER — Encounter: Payer: Self-pay | Admitting: Radiation Oncology

## 2020-08-31 ENCOUNTER — Ambulatory Visit
Admission: RE | Admit: 2020-08-31 | Discharge: 2020-08-31 | Disposition: A | Discharge status: Home / Self Care | Payer: Medicare HMO | Source: Ambulatory Visit | Attending: Radiation Oncology | Admitting: Radiation Oncology

## 2020-08-31 VITALS — BP 127/75 | HR 74 | Temp 97.2°F | Resp 18 | Ht 63.0 in | Wt 200.4 lb

## 2020-08-31 DIAGNOSIS — Z171 Estrogen receptor negative status [ER-]: Secondary | ICD-10-CM | POA: Insufficient documentation

## 2020-08-31 DIAGNOSIS — Z923 Personal history of irradiation: Secondary | ICD-10-CM | POA: Insufficient documentation

## 2020-08-31 DIAGNOSIS — C50412 Malignant neoplasm of upper-outer quadrant of left female breast: Secondary | ICD-10-CM | POA: Diagnosis not present

## 2020-08-31 DIAGNOSIS — D0511 Intraductal carcinoma in situ of right breast: Secondary | ICD-10-CM | POA: Insufficient documentation

## 2020-08-31 DIAGNOSIS — Z170 Estrogen receptor positive status [ER+]: Secondary | ICD-10-CM | POA: Diagnosis not present

## 2020-08-31 NOTE — Progress Notes (Signed)
Cheyenne Mcdonald is here today for follow up post radiation to the breast.   Breast Side: left and right breasts   They completed their radiation on: 07/20/2020   Does the patient complain of any of the following: Post radiation skin issues: denies Breast Tenderness: mild to left axilla Breast Swelling: denies Lymphadema: denies Range of Motion limitations: demonstrates full range of motion Fatigue post radiation: denies Appetite good/fair/poor: "too good"  Additional comments if applicable: none  Vitals:   08/31/20 1546  BP: 127/75  Pulse: 74  Resp: 18  Temp: (!) 97.2 F (36.2 C)  SpO2: 100%  Weight: 200 lb 6.4 oz (90.9 kg)  Height: '5\' 3"'$  (1.6 m)

## 2020-09-01 NOTE — H&P (Signed)
Cheyenne Mcdonald is an 70 y.o. female.   Chief Complaint: port no longer needed HPI: she has completed her treatment for breast cancer and her port-a-cath is no longer needed so removal has been requested. She has been doing well and has no comlaints  Past Medical History:  Diagnosis Date   Arthritis    gout big toe   Breast cancer ([place])    left breast IDC, right breast DCIS   History of radiation therapy 07/20/2020   bilateral breasts 06/01/2020-07/20/2020  Dr Gery Pray   Obesity    Sleep apnea    does not use CPAP    Past Surgical History:  Procedure Laterality Date   ABDOMINAL HYSTERECTOMY     BREAST LUMPECTOMY WITH RADIOACTIVE SEED AND SENTINEL LYMPH NODE BIOPSY Left 04/16/2020   Procedure: LEFT BREAST LUMPECTOMY WITH RADIOACTIVE SEED AND LEFT AXILLARY SENTINEL LYMPH NODE BIOPSY;  Surgeon: [name], MD;  Location: [facility];  Service: General;  Laterality: Left;   BREAST LUMPECTOMY WITH RADIOACTIVE SEED LOCALIZATION Right 04/16/2020   Procedure: RIGHT BREAST LUMPECTOMY WITH RADIOACTIVE SEED LOCALIZATION;  Surgeon: [name], MD;  Location: [facility];  Service: General;  Laterality: Right;   IR IMAGING GUIDED PORT INSERTION  10/18/2019    Family History  Problem Relation Age of Onset   Breast cancer Cousin 59       maternal cousin; bilateral    Social History:  reports that she has never smoked. She has never used smokeless tobacco. She reports that she does not drink alcohol and does not use drugs.  Allergies:  Allergies  Allergen Reactions   Shellfish Allergy Itching    No medications prior to admission.    No results found for this or any previous visit (from the past 48 hour(s)). No results found.  Review of Systems  All other systems reviewed and are negative.  Height 5' 3.5" (1.613 m), weight 90.7 kg. Physical Exam Constitutional:      General: She is not in acute distress.    Appearance: Normal appearance.   HENT:     Head: Normocephalic and atraumatic.     Nose: Nose normal.  Eyes:     Pupils: Pupils are equal, round, and reactive to light.  Cardiovascular:     Rate and Rhythm: Normal rate and regular rhythm.  Pulmonary:     Effort: Pulmonary effort is normal.     Breath sounds: Normal breath sounds.  Abdominal:     General: Abdomen is flat.     Palpations: Abdomen is soft.     Tenderness: There is no abdominal tenderness.  Musculoskeletal:        General: Normal range of motion.     Cervical back: Normal range of motion.  Skin:    General: Skin is warm and dry.  Neurological:     General: No focal deficit present.     Mental Status: She is alert.  Psychiatric:        Behavior: Behavior normal.     Assessment/Plan Port-a-cath no longer needed.  History of breast cancer  We will proceed to the OR for port removal.  I discussed the procedure and the risks with her and she agrees to proceed.  [name], MD 09/01/2020, 4:56 PM

## 2020-09-02 ENCOUNTER — Ambulatory Visit (HOSPITAL_BASED_OUTPATIENT_CLINIC_OR_DEPARTMENT_OTHER): Payer: Medicare HMO | Admitting: Anesthesiology

## 2020-09-02 ENCOUNTER — Encounter (HOSPITAL_BASED_OUTPATIENT_CLINIC_OR_DEPARTMENT_OTHER): Payer: Self-pay | Admitting: Surgery

## 2020-09-02 ENCOUNTER — Other Ambulatory Visit: Payer: Self-pay

## 2020-09-02 ENCOUNTER — Ambulatory Visit (HOSPITAL_BASED_OUTPATIENT_CLINIC_OR_DEPARTMENT_OTHER)
Admission: RE | Admit: 2020-09-02 | Discharge: 2020-09-02 | Disposition: A | Discharge status: Home / Self Care | Payer: Medicare HMO | Attending: Surgery | Admitting: Surgery

## 2020-09-02 ENCOUNTER — Inpatient Hospital Stay: Payer: Medicare HMO | Attending: Oncology | Admitting: Oncology

## 2020-09-02 ENCOUNTER — Encounter (HOSPITAL_BASED_OUTPATIENT_CLINIC_OR_DEPARTMENT_OTHER)
Admission: RE | Disposition: A | Discharge status: Home / Self Care | Payer: Self-pay | Source: Home / Self Care | Attending: Surgery

## 2020-09-02 DIAGNOSIS — Z853 Personal history of malignant neoplasm of breast: Secondary | ICD-10-CM | POA: Diagnosis not present

## 2020-09-02 DIAGNOSIS — C50412 Malignant neoplasm of upper-outer quadrant of left female breast: Secondary | ICD-10-CM

## 2020-09-02 DIAGNOSIS — Z171 Estrogen receptor negative status [ER-]: Secondary | ICD-10-CM

## 2020-09-02 DIAGNOSIS — Z803 Family history of malignant neoplasm of breast: Secondary | ICD-10-CM | POA: Diagnosis not present

## 2020-09-02 DIAGNOSIS — D0511 Intraductal carcinoma in situ of right breast: Secondary | ICD-10-CM | POA: Diagnosis not present

## 2020-09-02 DIAGNOSIS — Z452 Encounter for adjustment and management of vascular access device: Secondary | ICD-10-CM | POA: Insufficient documentation

## 2020-09-02 DIAGNOSIS — Z91013 Allergy to seafood: Secondary | ICD-10-CM | POA: Diagnosis not present

## 2020-09-02 DIAGNOSIS — G4730 Sleep apnea, unspecified: Secondary | ICD-10-CM | POA: Diagnosis not present

## 2020-09-02 HISTORY — DX: Unspecified osteoarthritis, unspecified site: M19.90

## 2020-09-02 HISTORY — PX: PORT-A-CATH REMOVAL: SHX5289

## 2020-09-02 SURGERY — REMOVAL PORT-A-CATH
Anesthesia: Monitor Anesthesia Care | Site: Chest | Laterality: Right

## 2020-09-02 MED ORDER — AMISULPRIDE (ANTIEMETIC) 5 MG/2ML IV SOLN: 10.0000 mg | Freq: Once | INTRAVENOUS | Status: DC | PRN

## 2020-09-02 MED ORDER — ACETAMINOPHEN 500 MG PO TABS
ORAL_TABLET | ORAL | Status: AC
  Filled 2020-09-02: qty 2

## 2020-09-02 MED ORDER — BUPIVACAINE HCL (PF) 0.5 % IJ SOLN
INTRAMUSCULAR | Status: AC
  Filled 2020-09-02: qty 30

## 2020-09-02 MED ORDER — EPHEDRINE SULFATE 50 MG/ML IJ SOLN
INTRAMUSCULAR | Status: DC | PRN
  Administered 2020-09-02: 10 mg via INTRAVENOUS

## 2020-09-02 MED ORDER — OXYCODONE HCL 5 MG/5ML PO SOLN: 5.0000 mg | Freq: Once | ORAL | Status: DC | PRN

## 2020-09-02 MED ORDER — PROMETHAZINE HCL 25 MG/ML IJ SOLN: 6.2500 mg | INTRAMUSCULAR | Status: DC | PRN

## 2020-09-02 MED ORDER — BUPIVACAINE-EPINEPHRINE (PF) 0.25% -1:200000 IJ SOLN
INTRAMUSCULAR | Status: AC
  Filled 2020-09-02: qty 60

## 2020-09-02 MED ORDER — TAMOXIFEN CITRATE 20 MG PO TABS: 20.0000 mg | ORAL_TABLET | Freq: Every day | ORAL | 12 refills | Status: DC

## 2020-09-02 MED ORDER — OXYCODONE HCL 5 MG PO TABS: 5.0000 mg | ORAL_TABLET | Freq: Once | ORAL | Status: DC | PRN

## 2020-09-02 MED ORDER — LIDOCAINE HCL (PF) 2 % IJ SOLN
INTRAMUSCULAR | Status: AC
  Filled 2020-09-02: qty 5

## 2020-09-02 MED ORDER — ONDANSETRON HCL 4 MG/2ML IJ SOLN
INTRAMUSCULAR | Status: AC
  Filled 2020-09-02: qty 2

## 2020-09-02 MED ORDER — LACTATED RINGERS IV SOLN: INTRAVENOUS | Status: DC

## 2020-09-02 MED ORDER — LIDOCAINE HCL (CARDIAC) PF 100 MG/5ML IV SOSY
PREFILLED_SYRINGE | INTRAVENOUS | Status: DC | PRN
  Administered 2020-09-02: 40 mg via INTRAVENOUS

## 2020-09-02 MED ORDER — LIDOCAINE HCL (PF) 1 % IJ SOLN
INTRAMUSCULAR | Status: DC | PRN
  Administered 2020-09-02: 10 mL

## 2020-09-02 MED ORDER — PROPOFOL 10 MG/ML IV BOLUS
INTRAVENOUS | Status: AC
  Filled 2020-09-02: qty 20

## 2020-09-02 MED ORDER — CEFAZOLIN SODIUM-DEXTROSE 2-4 GM/100ML-% IV SOLN
2.0000 g | INTRAVENOUS | Status: AC
  Administered 2020-09-02: 2 g via INTRAVENOUS

## 2020-09-02 MED ORDER — FENTANYL CITRATE (PF) 100 MCG/2ML IJ SOLN
INTRAMUSCULAR | Status: AC
  Filled 2020-09-02: qty 2

## 2020-09-02 MED ORDER — LIDOCAINE HCL (PF) 1 % IJ SOLN
INTRAMUSCULAR | Status: AC
  Filled 2020-09-02: qty 30

## 2020-09-02 MED ORDER — ACETAMINOPHEN 500 MG PO TABS
1000.0000 mg | ORAL_TABLET | ORAL | Status: AC
  Administered 2020-09-02: 1000 mg via ORAL

## 2020-09-02 MED ORDER — CEFAZOLIN SODIUM-DEXTROSE 2-4 GM/100ML-% IV SOLN
INTRAVENOUS | Status: AC
  Filled 2020-09-02: qty 100

## 2020-09-02 MED ORDER — HYDROMORPHONE HCL 1 MG/ML IJ SOLN: 0.2500 mg | INTRAMUSCULAR | Status: DC | PRN

## 2020-09-02 MED ORDER — ONDANSETRON HCL 4 MG/2ML IJ SOLN
INTRAMUSCULAR | Status: DC | PRN
  Administered 2020-09-02: 4 mg via INTRAVENOUS

## 2020-09-02 MED ORDER — FENTANYL CITRATE (PF) 100 MCG/2ML IJ SOLN
INTRAMUSCULAR | Status: DC | PRN
  Administered 2020-09-02 (×2): 50 ug via INTRAVENOUS

## 2020-09-02 MED ORDER — SODIUM BICARBONATE 4.2 % IV SOLN
INTRAVENOUS | Status: AC
  Filled 2020-09-02: qty 10

## 2020-09-02 MED ORDER — BUPIVACAINE-EPINEPHRINE (PF) 0.5% -1:200000 IJ SOLN
INTRAMUSCULAR | Status: AC
  Filled 2020-09-02: qty 30

## 2020-09-02 MED ORDER — PROPOFOL 500 MG/50ML IV EMUL
INTRAVENOUS | Status: DC | PRN
  Administered 2020-09-02 (×4): 20 mg via INTRAVENOUS

## 2020-09-02 SURGICAL SUPPLY — 33 items
ADH SKN CLS APL DERMABOND .7 (GAUZE/BANDAGES/DRESSINGS) ×1
APL PRP STRL LF DISP 70% ISPRP (MISCELLANEOUS) ×1
BLADE SURG 15 STRL LF DISP TIS (BLADE) ×1 IMPLANT
BLADE SURG 15 STRL SS (BLADE) ×2
CHLORAPREP W/TINT 26 (MISCELLANEOUS) ×2 IMPLANT
COVER BACK TABLE 60X90IN (DRAPES) ×2 IMPLANT
COVER MAYO STAND STRL (DRAPES) ×2 IMPLANT
DECANTER SPIKE VIAL GLASS SM (MISCELLANEOUS) IMPLANT
DERMABOND ADVANCED (GAUZE/BANDAGES/DRESSINGS) ×1
DERMABOND ADVANCED .7 DNX12 (GAUZE/BANDAGES/DRESSINGS) ×1 IMPLANT
DRAPE LAPAROTOMY 100X72 PEDS (DRAPES) ×2 IMPLANT
DRAPE UTILITY XL STRL (DRAPES) ×2 IMPLANT
ELECT REM PT RETURN 9FT ADLT (ELECTROSURGICAL) ×2
ELECTRODE REM PT RTRN 9FT ADLT (ELECTROSURGICAL) ×1 IMPLANT
GLOVE SURG ENC MOIS LTX SZ6 (GLOVE) ×2 IMPLANT
GLOVE SURG SIGNA 7.5 PF LTX (GLOVE) ×2 IMPLANT
GLOVE SURG UNDER POLY LF SZ6 (GLOVE) ×1 IMPLANT
GOWN STRL REUS W/ TWL LRG LVL3 (GOWN DISPOSABLE) ×1 IMPLANT
GOWN STRL REUS W/ TWL XL LVL3 (GOWN DISPOSABLE) ×1 IMPLANT
GOWN STRL REUS W/TWL LRG LVL3 (GOWN DISPOSABLE) ×2
GOWN STRL REUS W/TWL XL LVL3 (GOWN DISPOSABLE) ×2
NDL HYPO 25X1 1.5 SAFETY (NEEDLE) ×1 IMPLANT
NEEDLE HYPO 25X1 1.5 SAFETY (NEEDLE) ×2 IMPLANT
NS IRRIG 1000ML POUR BTL (IV SOLUTION) IMPLANT
PACK BASIN DAY SURGERY FS (CUSTOM PROCEDURE TRAY) ×2 IMPLANT
PENCIL SMOKE EVACUATOR (MISCELLANEOUS) ×2 IMPLANT
SLEEVE SCD COMPRESS KNEE MED (STOCKING) IMPLANT
SUT MNCRL AB 4-0 PS2 18 (SUTURE) ×2 IMPLANT
SUT VIC AB 3-0 SH 27 (SUTURE) ×2
SUT VIC AB 3-0 SH 27X BRD (SUTURE) ×1 IMPLANT
SYR BULB EAR ULCER 3OZ GRN STR (SYRINGE) IMPLANT
SYR CONTROL 10ML LL (SYRINGE) ×2 IMPLANT
TOWEL GREEN STERILE FF (TOWEL DISPOSABLE) ×2 IMPLANT

## 2020-09-02 NOTE — Interval H&P Note (Signed)
History and Physical Interval Note: no change in H and P  09/02/2020 8:03 AM  [place]  has presented today for surgery, with the diagnosis of PORT NO LONGER NEEDED HISTORY OF BREAST CANCER.  The various methods of treatment have been discussed with the patient and family. After consideration of risks, benefits and other options for treatment, the patient has consented to  Procedure(s): REMOVAL PORT-A-CATH (N/A) as a surgical intervention.  The patient's history has been reviewed, patient examined, no change in status, stable for surgery.  I have reviewed the patient's chart and labs.  Questions were answered to the patient's satisfaction.     [name]

## 2020-09-02 NOTE — Op Note (Signed)
Preoperative diagnosis: Removal of right internal jugular port   Postoperative diagnosis: same    Procedure: Removal of right internal jugular port   Surgeon: [name] MD   Asst: Zacarias Pontes MD   Anesthesia: MAC   Indications for procedure: Cheyenne Mcdonald is a 70 yo female with a right IJ port placed for chemotherapy. She has completed her treatment of breast cancer and the port is no longer needed.   Description of procedure: The patient was brought into the operative suite. Anesthesia was administered with Monitored Local Anesthesia with Sedation. WHO checklist was applied. The patient was then placed in supine position. The area was prepped and draped in the usual sterile fashion.   10 ml of local was used to infiltrate the skin and surrounding tissue. An incision was made over the prior port incision. A combination of blunt and electrocautery was used to dissect the subcutaneous tissue until the port was encountered. The port was extracted and the line was removed from the internal jugular vein. Gentle pressure was used over the subclavian as the line was removed and no back bleeding was noted. A figure of eight stitch with 3-0 vicryl was placed in the prior port tract. Interrupted 3-0 vicryl stitches were used to close the subcutaneous tissue. A 4-0 monocryl was used to close the skin. Dermabond was applied.   Findings: Port was removed intact   Specimen: None   Implant: None    Blood loss: 5cc               Local anesthesia:  10 ml marcaine    Complications: No immediate complications

## 2020-09-02 NOTE — Discharge Instructions (Addendum)
Ok to shower starting tomorrow  Ice pack, tylenol, and ibuprofen for pain  No vigorous activity for 5 days  No Tylenol until 1:15 today if needed  Post Anesthesia Home Care Instructions  Activity: Get plenty of rest for the remainder of the day. A responsible individual must stay with you for 24 hours following the procedure.  For the next 24 hours, DO NOT: -Drive a car -Paediatric nurse -Drink alcoholic beverages -Take any medication unless instructed by your physician -Make any legal decisions or sign important papers.  Meals: Start with liquid foods such as gelatin or soup. Progress to regular foods as tolerated. Avoid greasy, spicy, heavy foods. If nausea and/or vomiting occur, drink only clear liquids until the nausea and/or vomiting subsides. Call your physician if vomiting continues.  Special Instructions/Symptoms: Your throat may feel dry or sore from the anesthesia or the breathing tube placed in your throat during surgery. If this causes discomfort, gargle with warm salt water. The discomfort should disappear within 24 hours.  If you had a scopolamine patch placed behind your ear for the management of post- operative nausea and/or vomiting:  1. The medication in the patch is effective for 72 hours, after which it should be removed.  Wrap patch in a tissue and discard in the trash. Wash hands thoroughly with soap and water. 2. You may remove the patch earlier than 72 hours if you experience unpleasant side effects which may include dry mouth, dizziness or visual disturbances. 3. Avoid touching the patch. Wash your hands with soap and water after contact with the patch.

## 2020-09-02 NOTE — Progress Notes (Signed)
[place]  [phone] [phone]) [phone]     ID: Cheyenne Mcdonald DOB: [birth year]  MR#: [id number]  XQJ#:[id number]  Patient Care Team: Trey Sailors, PA as PCP - General (Physician Assistant) Servando Salina, MD as Consulting Physician (Obstetrics and Gynecology) Mauro Kaufmann, RN as Oncology Nurse Navigator Rockwell Germany, RN as Oncology Nurse Navigator [name], MD as Consulting Physician (General Surgery) Kaiyla Stahly, Virgie Dad, MD as Consulting Physician (Oncology) Gery Pray, MD as Consulting Physician (Radiation Oncology) Dimas Aguas, MD as Consulting Physician (Nephrology) Chauncey Cruel, MD OTHER MD:  I connected with Leta Jungling on 09/02/20 at 12:30 PM EDT by video enabled telemedicine visit and verified that I am speaking with the correct person using two identifiers.   I discussed the limitations, risks, security and privacy concerns of performing an evaluation and management service by telemedicine and the availability of in-person appointments. I also discussed with the patient that there may be a patient responsible charge related to this service. The patient expressed understanding and agreed to proceed.   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    She had her port removed earlier this morning.  She did fine with that procedure, "piece of cake".  She is planning a celebration on this Friday, September 04, 2020, with family and friends where she will given a presentation regarding breast cancer and encourage everyone to  get their mammogram regularly.   REVIEW OF SYSTEMS: Shakisha tells me she gained 15 pounds over the last several months and she is planning to walk 4 days a week.  She will go on the N0M diet and also do intermittent fasting.  She will cut back on carbs.  She is hopeful to regain her base weight within 6 months.  Aside from these issues a detailed review of systems today was stable.   COVID 19 VACCINATION STATUS: Status post Pfizer x2 with booster October 2021; also had COVID July 2022   HISTORY OF CURRENT ILLNESS: From the original intake note:  [name] presented for her routine mammography with a palpable left breast lump. She underwent bilateral diagnostic mammography with tomography and left breast ultrasonography at [facility] on 09/13/2019 showing: breast density category B; two adjacent masses in left breast at 1 o'clock, spanning approximately 3.3 cm; one indeterminate left axillary lymph node with 0.4 cm cortical bulge; no evidence right breast malignancy.  Accordingly on 09/27/2019 she proceeded to biopsy of the left breast areas in question. The pathology from this procedure ([id number]) showed: invasive ductal carcinoma, grade 3, present in both of the adjacent masses. Prognostic indicators significant for: estrogen receptor, 0% negative and progesterone receptor, 0% negative. Proliferation marker Ki67 at 90%. HER2 equivocal by immunohistochemistry (2+), but negative by fluorescent in situ hybridization with a signals ratio 2.2 and number per cell 3.3. Additional analysis of more cells showed a signals ratio 2.05 and number per cell 3.13.  The biopsied lymph node was negative for carcinoma.  This was felt to be concordant  The patient's subsequent history is as detailed below.   PAST MEDICAL HISTORY: Past Medical History:  Diagnosis Date  Arthritis    gout big toe   Breast cancer ([place])    left breast IDC, right breast DCIS   History of radiation therapy 07/20/2020    bilateral breasts 06/01/2020-07/20/2020  Dr Gery Pray   Obesity    Sleep apnea    does not use CPAP  heart murmur (since childhood)   PAST SURGICAL HISTORY: Past Surgical History:  Procedure Laterality Date   ABDOMINAL HYSTERECTOMY     BREAST LUMPECTOMY WITH RADIOACTIVE SEED AND SENTINEL LYMPH NODE BIOPSY Left 04/16/2020   Procedure: LEFT BREAST LUMPECTOMY WITH RADIOACTIVE SEED AND LEFT AXILLARY SENTINEL LYMPH NODE BIOPSY;  Surgeon: [name], MD;  Location: [facility];  Service: General;  Laterality: Left;   BREAST LUMPECTOMY WITH RADIOACTIVE SEED LOCALIZATION Right 04/16/2020   Procedure: RIGHT BREAST LUMPECTOMY WITH RADIOACTIVE SEED LOCALIZATION;  Surgeon: [name], MD;  Location: [facility];  Service: General;  Laterality: Right;   IR IMAGING GUIDED PORT INSERTION  10/18/2019    FAMILY HISTORY: Family History  Problem Relation Age of Onset   Breast cancer Cousin 26       maternal cousin; bilateral    Her father died at age 67, cause of death unknown. Her mother is age 60 as of 09/2019. [name] has two brothers (and no sisters). She reports one cousin with breast cancer at age 64.   GYNECOLOGIC HISTORY:  No LMP recorded. Patient has had a hysterectomy. Menarche: 70 years old Age at first live birth: 70 years old [place] P 2 LMP 1990 Contraceptive: used for maybe 20 years HRT: never used  Hysterectomy? Yes, 1990 BSO? 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 Husband [name] is retired Nature conservation officer and then retired Civil Service fast streamer.  At home is just the 2 of them. Daughter [name], age 29, works in Edgar entry in [employer]. Son [name], age 48, has a college degree but works for [employer] in [employer]. Mendy has four grandchildren. She attends [employer].    ADVANCED DIRECTIVES: In place   HEALTH MAINTENANCE: Social History   Tobacco Use   Smoking status: Never    Smokeless tobacco: Never  Substance Use Topics   Alcohol use: Never   Drug use: Never     Colonoscopy: 2018 (Dr. Collene Mares)  PAP: approx. 2013  Bone density: 2016, "normal"   Allergies  Allergen Reactions   Shellfish Allergy Itching    Current Outpatient Medications  Medication Sig Dispense Refill   tamoxifen (NOLVADEX) 20 MG tablet Take 1 tablet (20 mg total) by mouth daily. 90 tablet 12   allopurinol (ZYLOPRIM) 100 MG tablet Take 100 mg by mouth daily. (Patient not taking: Reported on 08/31/2020)     Cholecalciferol (VITAMIN D-3) 125 MCG (5000 UT) TABS 1 cap(s)     No current facility-administered medications for this visit.    OBJECTIVE: African American woman who appears younger than stated age  There were no vitals filed for this visit.    There is no height or weight on file to calculate BMI.   Wt Readings from Last 3 Encounters:  09/02/20 201 lb 11.5 oz (91.5 kg)  08/31/20 200 lb 6.4 oz (90.9 kg)  07/23/20 200 lb 8 oz (90.9 kg)      ECOG FS:1 - Symptomatic but completely ambulatory  Telemedicine visit 09/02/2020  LAB RESULTS:  CMP     Component Value Date/Time   NA 137 03/03/2020 0839  K 3.9 03/03/2020 0839   CL 106 03/03/2020 0839   CO2 22 03/03/2020 0839   GLUCOSE 101 (H) 03/03/2020 0839   BUN 14 03/03/2020 0839   CREATININE 1.05 (H) 03/03/2020 0839   CALCIUM 9.0 03/03/2020 0839   PROT 6.7 03/03/2020 0839   ALBUMIN 4.0 03/03/2020 0839   AST 13 (L) 03/03/2020 0839   ALT 11 03/03/2020 0839   ALKPHOS 62 03/03/2020 0839   BILITOT 0.4 03/03/2020 0839   GFRNONAA 57 (L) 03/03/2020 0839   GFRAA 58 (L) 10/09/2019 0806    No results found for: TOTALPROTELP, ALBUMINELP, A1GS, A2GS, BETS, BETA2SER, GAMS, MSPIKE, SPEI  Lab Results  Component Value Date   WBC 2.4 (L) 03/03/2020   NEUTROABS 1.5 (L) 03/03/2020   HGB 10.5 (L) 03/03/2020   HCT 29.4 (L) 03/03/2020   MCV 97.4 03/03/2020   PLT 144 (L) 03/03/2020    No results found for: LABCA2  No components  found for: EHMCNO709  No results for input(s): INR in the last 168 hours.  No results found for: LABCA2  No results found for: GGE366  No results found for: QHU765  No results found for: YYT035  No results found for: CA2729  No components found for: HGQUANT  No results found for: CEA1 / No results found for: CEA1   No results found for: AFPTUMOR  No results found for: CHROMOGRNA  No results found for: KPAFRELGTCHN, LAMBDASER, KAPLAMBRATIO (kappa/lambda light chains)  No results found for: HGBA, HGBA2QUANT, HGBFQUANT, HGBSQUAN (Hemoglobinopathy evaluation)   No results found for: LDH  No results found for: IRON, TIBC, IRONPCTSAT (Iron and TIBC)  No results found for: FERRITIN  Urinalysis No results found for: COLORURINE, APPEARANCEUR, LABSPEC, PHURINE, GLUCOSEU, HGBUR, BILIRUBINUR, KETONESUR, PROTEINUR, UROBILINOGEN, NITRITE, LEUKOCYTESUR   STUDIES: No results found.   ELIGIBLE FOR AVAILABLE RESEARCH PROTOCOL: AET  ASSESSMENT: 70 y.o. [place] woman status post left breast upper outer quadrant biopsy 09/27/2019 for a clinically T1-T2 N0, stage IA- IIA invasive ductal carcinoma, grade 3, triple negative, with an MIB-1 of 90%.  (1) genetics testing 10/30/2019 through the [facility] Common Hereditary Cancers Panel found no deleterious mutations in APC, ATM, AXIN2, BARD1, BMPR1A, BRCA1, BRCA2, BRIP1, CDH1, CDK4, CDKN2A (p14ARF), CDKN2A (p16INK4a), CHEK2, CTNNA1, DICER1, EPCAM (Deletion/duplication testing only), GREM1 (promoter region deletion/duplication testing only), KIT, MEN1, MLH1, MSH2, MSH3, MSH6, MUTYH, NBN, NF1, NHTL1, PALB2, PDGFRA, PMS2, POLD1, POLE, PTEN, RAD50, RAD51C, RAD51D, RNF43, SDHB, SDHC, SDHD, SMAD4, SMARCA4. STK11, TP53, TSC1, TSC2, and VHL.  The following genes were evaluated for sequence changes only: SDHA and HOXB13 c.251G>A variant only.  LEFT BREAST: (2) neoadjuvant chemotherapy consisting of doxorubicin and cyclophosphamide in dose dense fashion  x4 starting 10/22/2019, completed 12/03/2019,followed by weekly carboplatin and paclitaxel x12 started 12/17/2019, last dose 02/25/2020  (a) echo 10/17/2019 shows an ejection fraction in the 60-65% range  (b) final 2 doses of carboplatinum/paclitaxel omitted with grade 1 neuropathy  (3) status post left lumpectomy and sentinel lymph node sampling 04/16/2020 for a ypT0 ypN0, complete pathologic response  (a) a total of 3 left axillary lymph nodes were removed  RIGHT BREAST: (4) multicentric biopsy x2 on the right (contralateral) breast 02/27/2020 shows ductal carcinoma in situ, estrogen and progesterone receptor strongly positive  (5) right lumpectomy 04/16/2020 shows a residual 1.4 cm of ductal carcinoma in situ, grade 1, with close but negative margins  (6) adjuvant radiation to both breasts completed 07/20/2020  (7) tamoxifen started 09/02/2020   PLAN: Surabhi has completed her local treatment.  She is considering antiestrogens for further risk reduction and also to reduce the risk of a new breast cancer developing in the breast in the future.  We discussed the details of anastrozole and tamoxifen at the last visit.  She had that information in writing.  She has reviewed it.  She is aware of the possible toxicities side effects and complications of these agents.  She is interested in tamoxifen, with a good understanding of the possible risks of clots.  She is status post hysterectomy so that is favorable.  She is also aware of the benefits of this medication.  We discussed when we would resume mammography and she would like to do it sometime this year.  I have set her mammogram for the first week in December and she will see me shortly after that.  If she has any unusual side effects from the tamoxifen or finds she cannot tolerate it she will call and let us know and we will switch to anastrozole.  I provided 20 minutes of face-to-face video visit time during this encounter, and > 50% was  spent counseling as documented under my assessment & plan.   Virgie Dad. Manhattan Mccuen, MD 09/02/2020 3:23 PM Medical Oncology and Hematology [facility] [place], [place] [address] Tel. [phone]    Fax. [phone]   This document serves as a record of services personally performed by Lurline Del, MD. It was created on his behalf by Wilburn Mylar, a trained medical scribe. The creation of this record is based on the scribe's personal observations and the provider's statements to them.   I, Lurline Del MD, have reviewed the above documentation for accuracy and completeness, and I agree with the above.   *Total Encounter Time as defined by the Centers for Medicare and Medicaid Services includes, in addition to the face-to-face time of a patient visit (documented in the note above) non-face-to-face time: obtaining and reviewing outside history, ordering and reviewing medications, tests or procedures, care coordination (communications with other health care professionals or caregivers) and documentation in the medical record.

## 2020-09-02 NOTE — Transfer of Care (Signed)
Immediate Anesthesia Transfer of Care Note  Patient: Cheyenne Mcdonald  Procedure(s) Performed: REMOVAL PORT-A-CATH (Right: Chest)  Patient Location: PACU  Anesthesia Type:MAC  Level of Consciousness: awake, alert  and oriented  Airway & Oxygen Therapy: Patient Spontanous Breathing and Patient connected to face mask oxygen  Post-op Assessment: Report given to RN and Post -op Vital signs reviewed and stable  Post vital signs: Reviewed and stable  Last Vitals:  Vitals Value Taken Time  BP 105/69 09/02/20 0853  Temp    Pulse 55 09/02/20 0856  Resp 10 09/02/20 0856  SpO2 100 % 09/02/20 0856  Vitals shown include unvalidated device data.  Last Pain:  Vitals:   09/02/20 0714  TempSrc: Oral  PainSc: 0-No pain      Patients Stated Pain Goal: 2 (57/49/35 5217)  Complications: No notable events documented.

## 2020-09-02 NOTE — Anesthesia Preprocedure Evaluation (Addendum)
Anesthesia Evaluation  Patient identified by MRN, date of birth, ID band Patient awake    Reviewed: Allergy & Precautions, NPO status , Patient's Chart, lab work & pertinent test results  Airway Mallampati: II  TM Distance: >3 FB Neck ROM: Full    Dental no notable dental hx.    Pulmonary sleep apnea ,    Pulmonary exam normal breath sounds clear to auscultation       Cardiovascular negative cardio ROS Normal cardiovascular exam Rhythm:Regular Rate:Normal     Neuro/Psych negative neurological ROS  negative psych ROS   GI/Hepatic negative GI ROS, Neg liver ROS,   Endo/Other  negative endocrine ROS  Renal/GU negative Renal ROS  negative genitourinary   Musculoskeletal  (+) Arthritis , Osteoarthritis,    Abdominal (+) + obese,   Peds negative pediatric ROS (+)  Hematology negative hematology ROS (+)   Anesthesia Other Findings Breast Cancer  Reproductive/Obstetrics negative OB ROS                             Anesthesia Physical  Anesthesia Plan  ASA: 2  Anesthesia Plan: MAC   Post-op Pain Management:    Induction: Intravenous  PONV Risk Score and Plan: 2 and Ondansetron, Midazolam and Treatment may vary due to age or medical condition  Airway Management Planned: Simple Face Mask  Additional Equipment:   Intra-op Plan:   Post-operative Plan:   Informed Consent: I have reviewed the patients History and Physical, chart, labs and discussed the procedure including the risks, benefits and alternatives for the proposed anesthesia with the patient or authorized representative who has indicated his/her understanding and acceptance.     Dental advisory given  Plan Discussed with: CRNA  Anesthesia Plan Comments:        Anesthesia Quick Evaluation

## 2020-09-02 NOTE — Anesthesia Postprocedure Evaluation (Signed)
Anesthesia Post Note  Patient: Cheyenne Mcdonald  Procedure(s) Performed: REMOVAL PORT-A-CATH (Right: Chest)     Patient location during evaluation: PACU Anesthesia Type: MAC Level of consciousness: awake and alert Pain management: pain level controlled Vital Signs Assessment: post-procedure vital signs reviewed and stable Respiratory status: spontaneous breathing, nonlabored ventilation and respiratory function stable Cardiovascular status: blood pressure returned to baseline and stable Postop Assessment: no apparent nausea or vomiting Anesthetic complications: no   No notable events documented.  Last Vitals:  Vitals:   09/02/20 0915 09/02/20 0925  BP: 123/63 125/69  Pulse: (!) 50 (!) 49  Resp: 14 16  Temp:  36.6 C  SpO2: 99% 98%    Last Pain:  Vitals:   09/02/20 0925  TempSrc: Oral  PainSc: 0-No pain                 Lynda Rainwater

## 2020-09-03 ENCOUNTER — Encounter (HOSPITAL_BASED_OUTPATIENT_CLINIC_OR_DEPARTMENT_OTHER): Payer: Self-pay | Admitting: Surgery

## 2020-09-03 DIAGNOSIS — H40023 Open angle with borderline findings, high risk, bilateral: Secondary | ICD-10-CM | POA: Diagnosis not present

## 2020-09-17 ENCOUNTER — Ambulatory Visit
Admission: RE | Admit: 2020-09-17 | Discharge: 2020-09-17 | Disposition: A | Discharge status: Home / Self Care | Payer: Medicare HMO | Source: Ambulatory Visit | Attending: Oncology | Admitting: Oncology

## 2020-09-17 ENCOUNTER — Other Ambulatory Visit: Payer: Self-pay

## 2020-09-17 DIAGNOSIS — Z853 Personal history of malignant neoplasm of breast: Secondary | ICD-10-CM | POA: Diagnosis not present

## 2020-09-17 DIAGNOSIS — R922 Inconclusive mammogram: Secondary | ICD-10-CM | POA: Diagnosis not present

## 2020-09-17 DIAGNOSIS — C50412 Malignant neoplasm of upper-outer quadrant of left female breast: Secondary | ICD-10-CM

## 2020-09-17 HISTORY — DX: Personal history of irradiation: Z92.3

## 2020-09-17 HISTORY — DX: Personal history of antineoplastic chemotherapy: Z92.21

## 2020-09-17 IMAGING — MG DIGITAL DIAGNOSTIC BILAT W/ TOMO W/ CAD
6 of 10 series · 6 of 26 positions shown · non-contrast
Comparison: Previous exam(s).

CLINICAL DATA: 70-year-old female for annual follow-up. History of
bilateral breast cancers and lumpectomies in [FT].

EXAM:
DIGITAL DIAGNOSTIC BILATERAL MAMMOGRAM WITH CAD AND TOMO

[R CC]
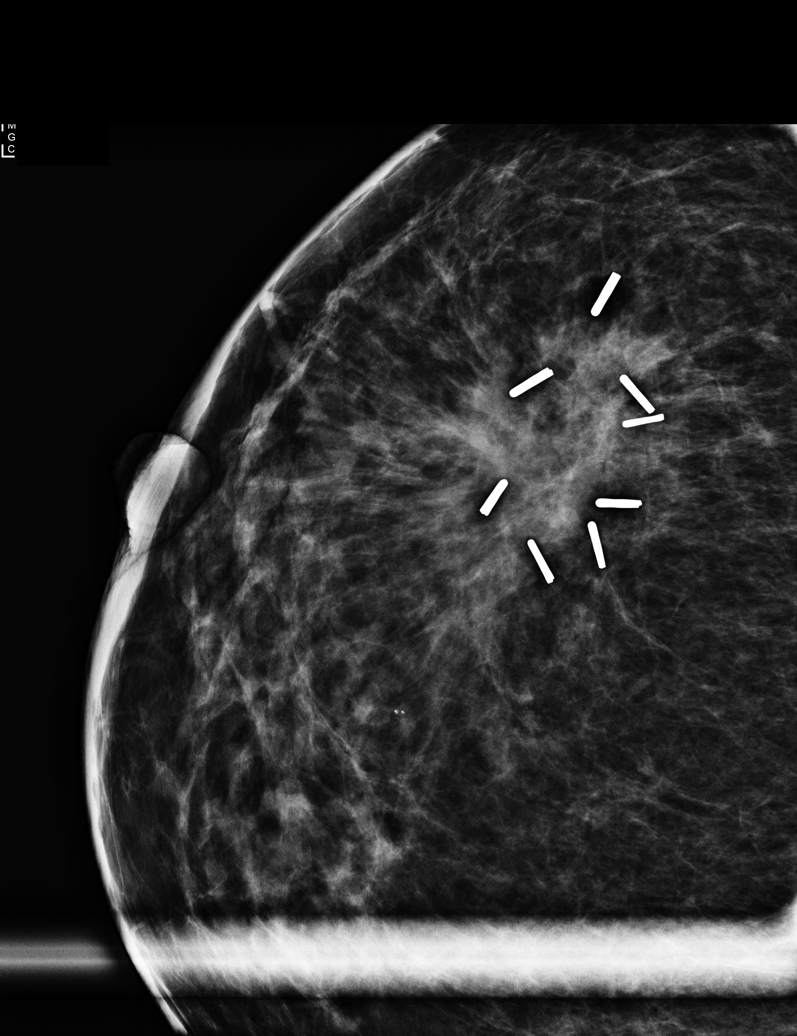

[L CC]
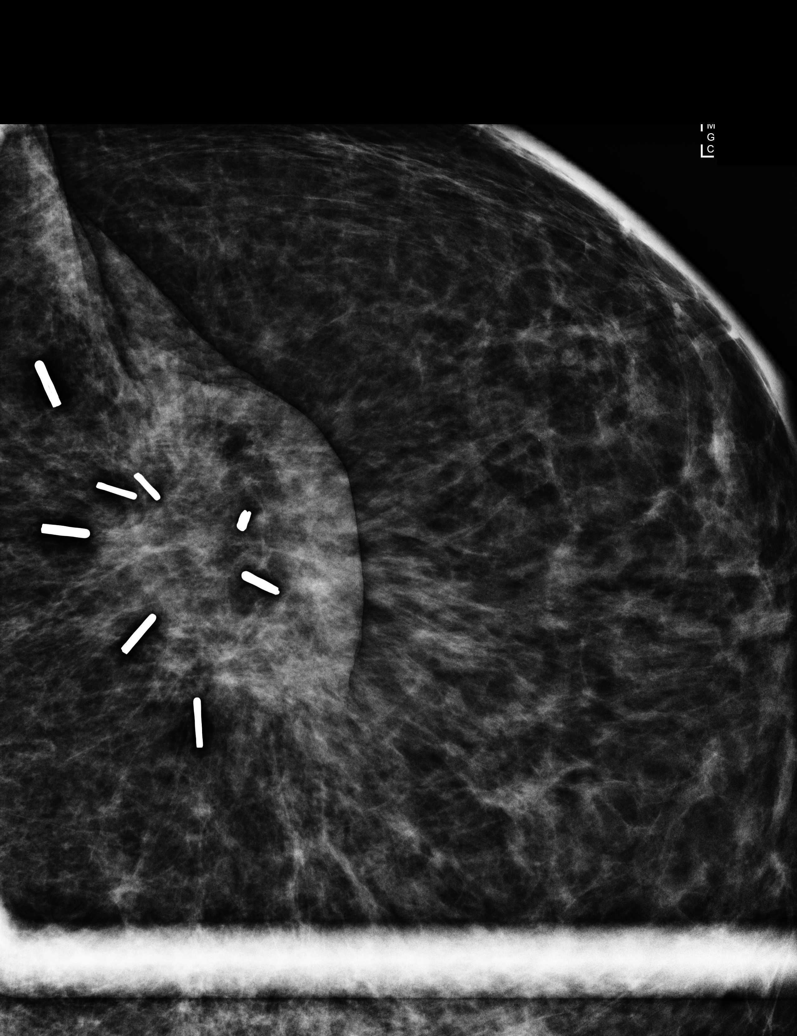

[L MLO synth-2D]
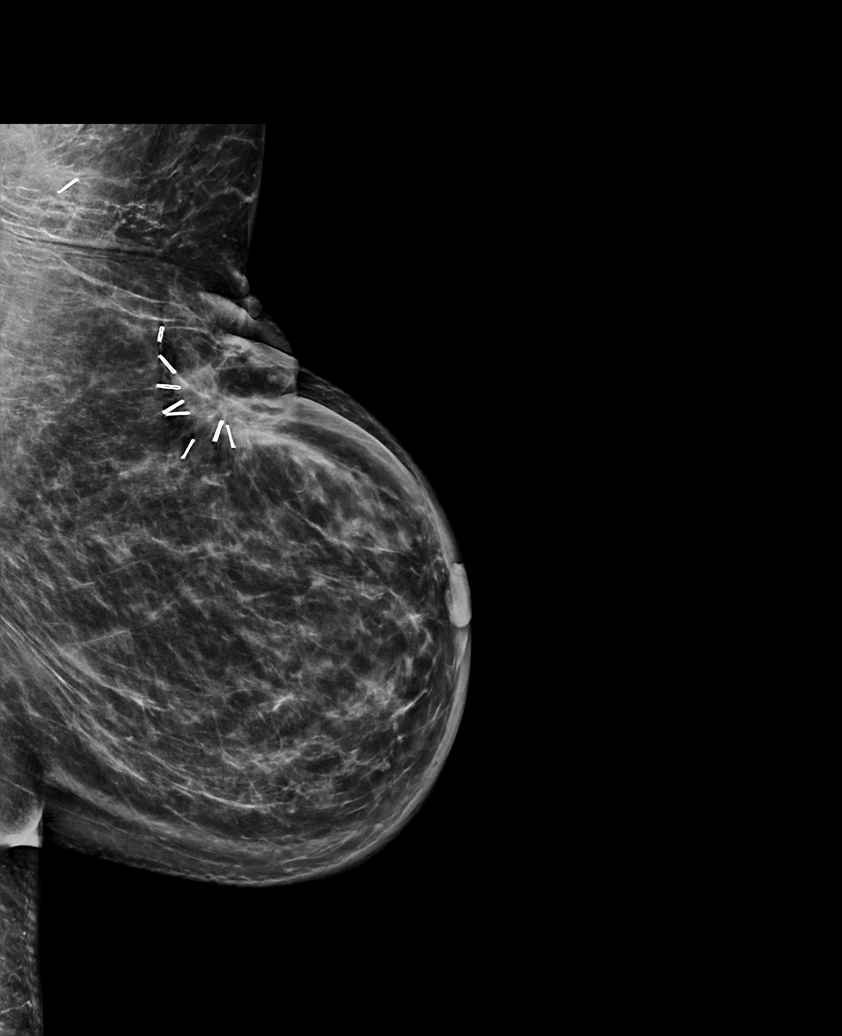

[L CC synth-2D]
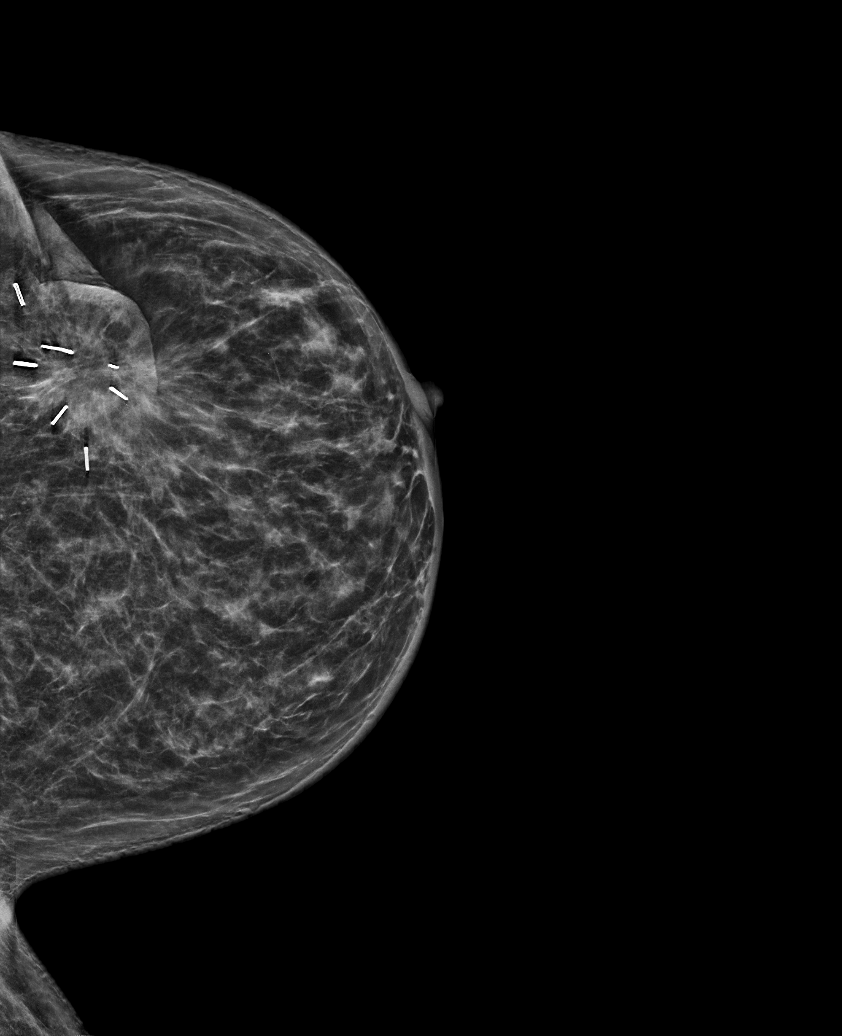

[R MLO synth-2D]
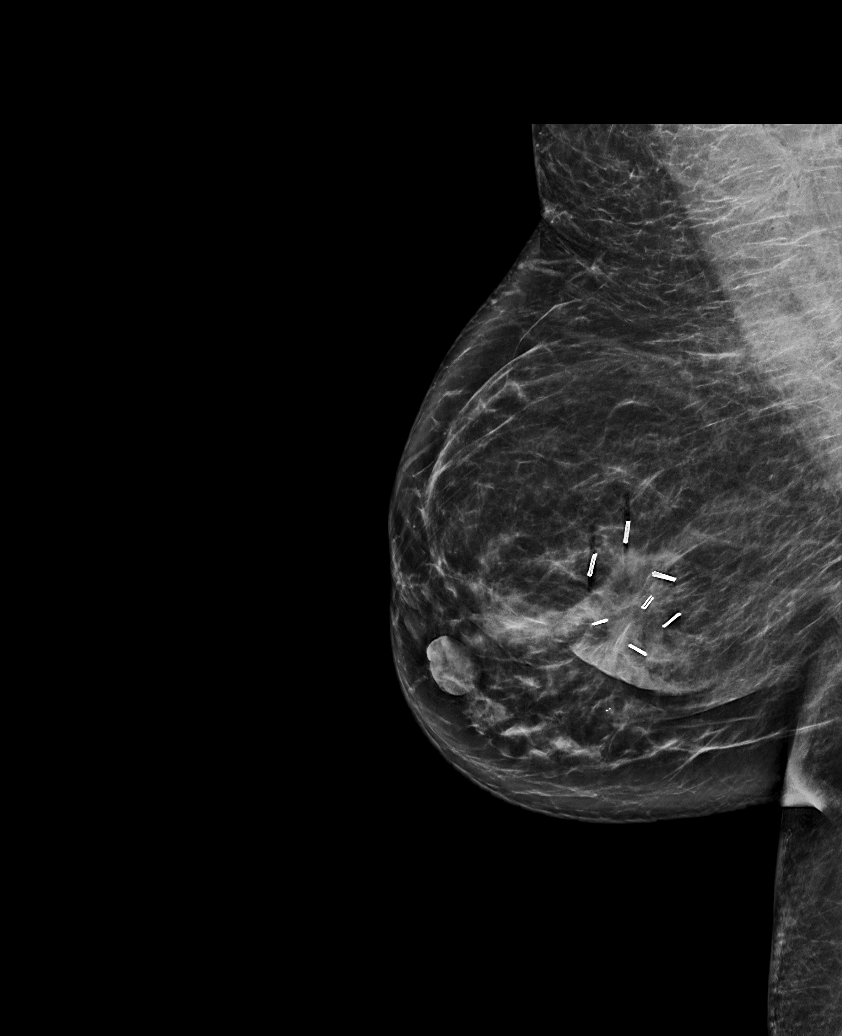

[R CC synth-2D]
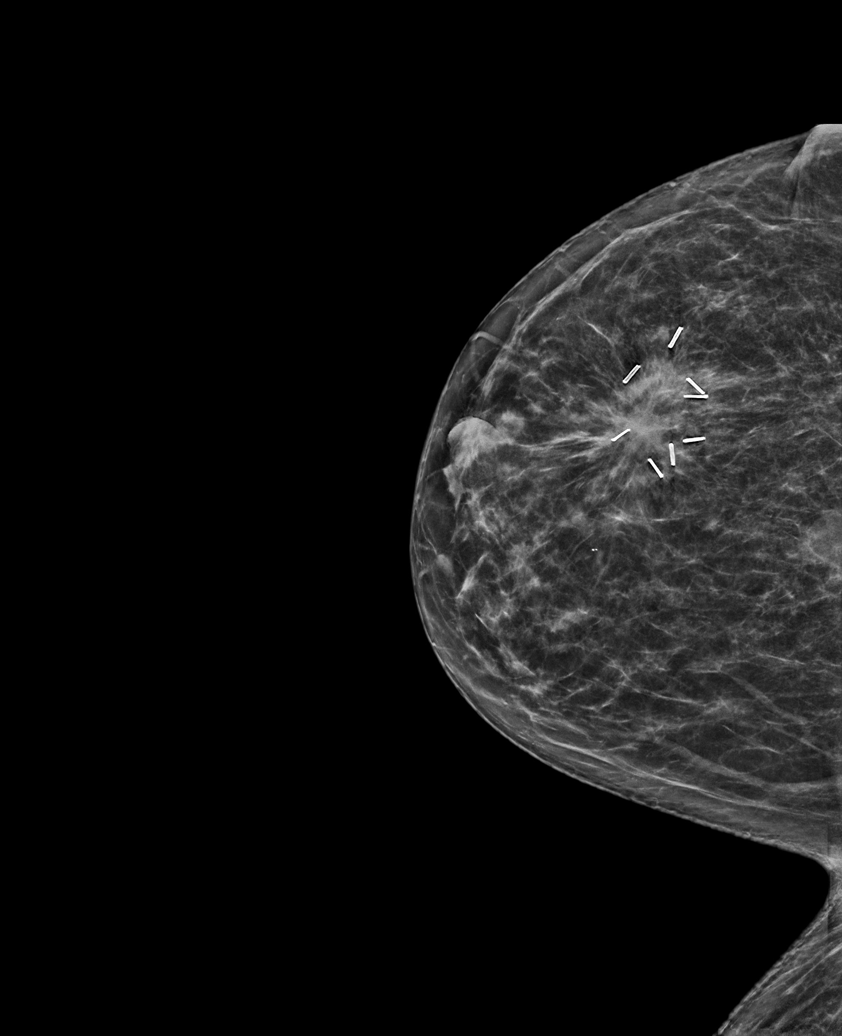

[6 of 26 positions shown; findings below may reference images not displayed]

ACR Breast Density Category b: There are scattered areas of
fibroglandular density.
FINDINGS: 2D and 3D full field views of both breasts and magnification views
of both lumpectomy sites demonstrate no suspicious mass, nonsurgical
distortion or worrisome calcifications.

Lumpectomy changes within both breasts are noted.

Mammographic images were processed with CAD.
IMPRESSION: No evidence of breast malignancy.

Bilateral lumpectomy changes.

RECOMMENDATION:
Bilateral diagnostic mammogram in 1 year.

I have discussed the findings and recommendations with the patient.
If applicable, a reminder letter will be sent to the patient
regarding the next appointment.

BI-RADS CATEGORY  2: Benign.

## 2020-09-23 ENCOUNTER — Other Ambulatory Visit: Payer: Self-pay | Admitting: *Deleted

## 2020-09-23 MED ORDER — TAMOXIFEN CITRATE 20 MG PO TABS: 20.0000 mg | ORAL_TABLET | Freq: Every day | ORAL | 3 refills | Status: AC

## 2020-09-29 DIAGNOSIS — Z90710 Acquired absence of both cervix and uterus: Secondary | ICD-10-CM | POA: Diagnosis not present

## 2020-09-29 DIAGNOSIS — Z79810 Long term (current) use of selective estrogen receptor modulators (SERMs): Secondary | ICD-10-CM | POA: Diagnosis not present

## 2020-09-29 DIAGNOSIS — Z01419 Encounter for gynecological examination (general) (routine) without abnormal findings: Secondary | ICD-10-CM | POA: Diagnosis not present

## 2020-09-29 DIAGNOSIS — Z853 Personal history of malignant neoplasm of breast: Secondary | ICD-10-CM | POA: Diagnosis not present

## 2020-10-08 DIAGNOSIS — R102 Pelvic and perineal pain: Secondary | ICD-10-CM | POA: Diagnosis not present

## 2020-10-08 DIAGNOSIS — Z90710 Acquired absence of both cervix and uterus: Secondary | ICD-10-CM | POA: Diagnosis not present

## 2020-11-11 DIAGNOSIS — Z9989 Dependence on other enabling machines and devices: Secondary | ICD-10-CM | POA: Diagnosis not present

## 2020-11-11 DIAGNOSIS — G4710 Hypersomnia, unspecified: Secondary | ICD-10-CM | POA: Diagnosis not present

## 2020-11-11 DIAGNOSIS — G4733 Obstructive sleep apnea (adult) (pediatric): Secondary | ICD-10-CM | POA: Diagnosis not present

## 2020-11-11 DIAGNOSIS — Z6835 Body mass index (BMI) 35.0-35.9, adult: Secondary | ICD-10-CM | POA: Diagnosis not present

## 2020-11-12 DIAGNOSIS — H25813 Combined forms of age-related cataract, bilateral: Secondary | ICD-10-CM | POA: Diagnosis not present

## 2020-11-12 DIAGNOSIS — H04123 Dry eye syndrome of bilateral lacrimal glands: Secondary | ICD-10-CM | POA: Diagnosis not present

## 2020-11-12 DIAGNOSIS — H40023 Open angle with borderline findings, high risk, bilateral: Secondary | ICD-10-CM | POA: Diagnosis not present

## 2020-11-24 NOTE — Progress Notes (Incomplete)
[place]  [phone] Fax:(336) [phone]     ID: Cheyenne Mcdonald DOB: [birth year]  MR#: [id number]  AGT#:[id number]  Patient Care Team: Cheyenne Sailors, PA as PCP - General (Physician Assistant) Cheyenne Salina, MD as Consulting Physician (Obstetrics and Gynecology) Cheyenne Kaufmann, RN as Oncology Nurse Navigator Cheyenne Germany, RN as Oncology Nurse Navigator Cheyenne Keens, MD as Consulting Physician (General Surgery) Magrinat, Virgie Dad, MD as Consulting Physician (Oncology) [place] Pray, MD as Consulting Physician (Radiation Oncology) Cheyenne Aguas, MD as Consulting Physician (Nephrology) Cheyenne Cruel, MD OTHER MD: I connected with Cheyenne Mcdonald on 11/24/20 at  2:00 PM EST by {Blank single:19197::"video enabled telemedicine visit","telephone visit"} and verified that I am speaking with Cheyenne correct person using two identifiers.   I discussed Cheyenne limitations, risks, security and privacy concerns of performing an evaluation and management service by telemedicine and Cheyenne availability of in-person appointments. I also discussed with Cheyenne patient that there may be a patient responsible charge related to this service. Cheyenne patient expressed understanding and agreed to proceed.   Other persons participating in Cheyenne visit and their role in Cheyenne encounter: ***   Patients location: ***  Providers location: [facility]   I provided *** minutes of {Blank single:19197::"face-to-face video visit time","non face-to-face telephone visit time"} during this encounter, and > 50% was spent counseling as documented under my assessment & plan.   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    She was started on tamoxifen at her last  visit on 09/02/2020.  Since her last visit, she underwent bilateral diagnostic mammography with tomography at [facility] on 09/17/2020 showing: breast density category B; no evidence of malignancy in either breast.    REVIEW OF SYSTEMS: Cathe    COVID 19 VACCINATION STATUS: Status post [place] x2 with booster October 2021; also had COVID July 2022   HISTORY OF CURRENT ILLNESS: From Cheyenne original intake note:  Cheyenne Mcdonald presented for her routine mammography with a palpable left breast lump. She underwent bilateral diagnostic mammography with tomography and left breast ultrasonography at [facility] on 09/13/2019 showing: breast density category B; two adjacent masses in left breast at 1 o'clock, spanning approximately 3.3 cm; one indeterminate left axillary lymph node with 0.4 cm cortical bulge; no evidence right breast malignancy.  Accordingly on 09/27/2019 she proceeded to biopsy of Cheyenne left breast areas in question. Cheyenne pathology from this procedure ([id number]) showed: invasive ductal carcinoma, grade 3, present in both of Cheyenne adjacent masses. Prognostic indicators significant for: estrogen receptor, 0% negative and progesterone receptor, 0% negative. Proliferation marker Ki67 at 90%. HER2 equivocal by immunohistochemistry (2+), but negative by fluorescent in situ hybridization with a signals ratio 2.2 and number per cell 3.3. Additional analysis of more cells showed a signals ratio 2.05 and number per cell 3.13.  Cheyenne biopsied lymph node was negative for carcinoma.  This was felt to be concordant  Cheyenne patient's subsequent history is as detailed below.   PAST MEDICAL HISTORY: Past Medical History:  Diagnosis Date   Arthritis    gout big toe   Breast cancer ([place])    left breast IDC, right breast DCIS   History of radiation therapy 07/20/2020   bilateral breasts 06/01/2020-07/20/2020  [facility]   Obesity  Sleep apnea    does not use CPAP  heart murmur (since  childhood)   PAST SURGICAL HISTORY: Past Surgical History:  Procedure Laterality Date   ABDOMINAL HYSTERECTOMY     BREAST LUMPECTOMY WITH RADIOACTIVE SEED AND SENTINEL LYMPH NODE BIOPSY Left 04/16/2020   Procedure: LEFT BREAST LUMPECTOMY WITH RADIOACTIVE SEED AND LEFT AXILLARY SENTINEL LYMPH NODE BIOPSY;  Surgeon: Cheyenne Keens, MD;  Location: [facility];  Service: General;  Laterality: Left;   BREAST LUMPECTOMY WITH RADIOACTIVE SEED LOCALIZATION Right 04/16/2020   Procedure: RIGHT BREAST LUMPECTOMY WITH RADIOACTIVE SEED LOCALIZATION;  Surgeon: [place] Keens, MD;  [facility]: [facility];  Service: General;  Laterality: Right;   IR IMAGING GUIDED PORT INSERTION  10/18/2019    FAMILY HISTORY: Family History  Problem Relation Age of Onset   Breast cancer Cousin 64       maternal cousin; bilateral    Her father died at age 63, cause of death unknown. Her mother is age 49 as of 09/2019. Cheyenne Mcdonald has two brothers (and no sisters). She reports one cousin with breast cancer at age 53.   GYNECOLOGIC HISTORY:  No LMP recorded. Patient has had a hysterectomy. Menarche: 70 years old Age at first live birth: 70 years old [place] P 2 LMP 1990 Contraceptive: used for maybe 20 years HRT: never used  Hysterectomy? Yes, 1990 BSO? 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 Husband Cheyenne Mcdonald is retired Nature conservation officer and then retired Civil Service fast streamer.  At home is just Cheyenne 2 of them. Daughter Cheyenne Mcdonald, age 8, works in [employer] in [place]. Son Cheyenne Mcdonald, age 35, has a college degree but works for [place] in [place]. Cheyenne Mcdonald has four grandchildren. She attends [place] [employer].    ADVANCED DIRECTIVES: In place   HEALTH MAINTENANCE: Social History   Tobacco Use   Smoking status: Never   Smokeless tobacco: Never  Substance Use Topics   Alcohol use: Never   Drug use: Never     Colonoscopy: 2018 ([facility])  PAP:  approx. 2013  Bone density: 2016, "normal"   Allergies  Allergen Reactions   Shellfish Allergy Itching    Current Outpatient Medications  Medication Sig Dispense Refill   tamoxifen (NOLVADEX) 20 MG tablet Take 1 tablet (20 mg total) by mouth daily. 90 tablet 12   allopurinol (ZYLOPRIM) 100 MG tablet Take 100 mg by mouth daily. (Patient not taking: Reported on 08/31/2020)     Cholecalciferol (VITAMIN D-3) 125 MCG (5000 UT) TABS 1 cap(s)     No current facility-administered medications for this visit.    OBJECTIVE: African American woman who appears younger than stated age  There were no vitals filed for this visit.    There is no height or weight on file to calculate BMI.   Wt Readings from Last 3 Encounters:  09/02/20 201 lb 11.5 oz (91.5 kg)  08/31/20 200 lb 6.4 oz (90.9 kg)  07/23/20 200 lb 8 oz (90.9 kg)      ECOG FS:1 - Symptomatic but completely ambulatory  Telemedicine visit 11/25/2020  LAB RESULTS:  CMP     Component Value Date/Time   NA 137 03/03/2020 0839   K 3.9 03/03/2020 0839   CL 106 03/03/2020 0839   CO2 22 03/03/2020 0839   GLUCOSE 101 (H) 03/03/2020 0839   BUN 14 03/03/2020 0839   CREATININE 1.05 (H) 03/03/2020 0839   CALCIUM 9.0 03/03/2020 0839  PROT 6.7 03/03/2020 0839   ALBUMIN 4.0 03/03/2020 0839   AST 13 (L) 03/03/2020 0839   ALT 11 03/03/2020 0839   ALKPHOS 62 03/03/2020 0839   BILITOT 0.4 03/03/2020 0839   GFRNONAA 57 (L) 03/03/2020 0839   GFRAA 58 (L) 10/09/2019 0806    No results found for: TOTALPROTELP, ALBUMINELP, A1GS, A2GS, BETS, BETA2SER, GAMS, MSPIKE, SPEI  Lab Results  Component Value Date   WBC 2.4 (L) 03/03/2020   NEUTROABS 1.5 (L) 03/03/2020   HGB 10.5 (L) 03/03/2020   HCT 29.4 (L) 03/03/2020   MCV 97.4 03/03/2020   PLT 144 (L) 03/03/2020    No results found for: LABCA2  No components found for: XBJYNW295  No results for input(s): INR in Cheyenne last 168 hours.  No results found for: LABCA2  No results found for:  AOZ308  No results found for: MVH846  No results found for: NGE952  No results found for: CA2729  No components found for: HGQUANT  No results found for: CEA1 / No results found for: CEA1   No results found for: AFPTUMOR  No results found for: CHROMOGRNA  No results found for: KPAFRELGTCHN, LAMBDASER, KAPLAMBRATIO (kappa/lambda light chains)  No results found for: HGBA, HGBA2QUANT, HGBFQUANT, HGBSQUAN (Hemoglobinopathy evaluation)   No results found for: LDH  No results found for: IRON, TIBC, IRONPCTSAT (Iron and TIBC)  No results found for: FERRITIN  Urinalysis No results found for: COLORURINE, APPEARANCEUR, LABSPEC, PHURINE, GLUCOSEU, HGBUR, BILIRUBINUR, KETONESUR, PROTEINUR, UROBILINOGEN, NITRITE, LEUKOCYTESUR   STUDIES: No results found.   ELIGIBLE FOR AVAILABLE RESEARCH PROTOCOL: AET  ASSESSMENT: 70 y.o. [place] woman status post left breast upper outer quadrant biopsy 09/27/2019 for a clinically T1-T2 N0, stage IA- IIA invasive ductal carcinoma, grade 3, triple negative, with an MIB-1 of 90%.  (1) genetics testing 10/30/2019 through [facility] Common Hereditary Cancers Panel found no deleterious mutations in APC, ATM, AXIN2, BARD1, BMPR1A, BRCA1, BRCA2, BRIP1, CDH1, CDK4, CDKN2A (p14ARF), CDKN2A (p16INK4a), CHEK2, CTNNA1, DICER1, EPCAM (Deletion/duplication testing only), GREM1 (promoter region deletion/duplication testing only), KIT, MEN1, MLH1, MSH2, MSH3, MSH6, MUTYH, NBN, NF1, NHTL1, PALB2, PDGFRA, PMS2, POLD1, POLE, PTEN, RAD50, RAD51C, RAD51D, RNF43, SDHB, SDHC, SDHD, SMAD4, SMARCA4. STK11, TP53, TSC1, TSC2, and VHL.  Cheyenne following genes were evaluated for sequence changes only: SDHA and HOXB13 c.251G>A variant only.  LEFT BREAST: (2) neoadjuvant chemotherapy consisting of doxorubicin and cyclophosphamide in dose dense fashion x4 starting 10/22/2019, completed 12/03/2019,followed by weekly carboplatin and paclitaxel x12 started 12/17/2019, last dose  02/25/2020  (a) echo 10/17/2019 shows an ejection fraction in Cheyenne 60-65% range  (b) final 2 doses of carboplatinum/paclitaxel omitted with grade 1 neuropathy  (3) status post left lumpectomy and sentinel lymph node sampling 04/16/2020 for a ypT0 ypN0, complete pathologic response  (a) a total of 3 left axillary lymph nodes were removed  RIGHT BREAST: (4) multicentric biopsy x2 on Cheyenne right (contralateral) breast 02/27/2020 shows ductal carcinoma in situ, estrogen and progesterone receptor strongly positive  (5) right lumpectomy 04/16/2020 shows a residual 1.4 cm of ductal carcinoma in situ, grade 1, with close but negative margins  (6) adjuvant radiation to both breasts completed 07/20/2020  (7) tamoxifen started 09/02/2020   PLAN: Cheyenne Mcdonald has completed her local treatment.  She is considering antiestrogens for further risk reduction and also to reduce Cheyenne risk of a new breast cancer developing in Cheyenne breast in Cheyenne future.  We discussed Cheyenne details of anastrozole and tamoxifen at Cheyenne last visit.  She had that information  in writing.  She has reviewed it.  She is aware of Cheyenne possible toxicities side effects and complications of these agents.  She is interested in tamoxifen, with a good understanding of Cheyenne possible risks of clots.  She is status post hysterectomy so that is favorable.  She is also aware of Cheyenne benefits of this medication.  We discussed when we would resume mammography and she would like to do it sometime this year.  I have set her mammogram for Cheyenne first week in December and she will see me shortly after that.  If she has any unusual side effects from Cheyenne tamoxifen or finds she cannot tolerate it she will call and let us know and we will switch to anastrozole.  I provided 20 minutes of face-to-face video visit time during this encounter, and > 50% was spent counseling as documented under my assessment & plan.   Virgie Mcdonald. Magrinat, MD 09/02/2020 3:23 PM Medical Oncology and  Hematology [facility] [place], [place] [address] Tel. [phone]    Fax. [phone]   This document serves as a record of services personally performed by Lurline Del, MD. It was created on his behalf by Wilburn Mylar, a trained medical scribe. Cheyenne creation of this record is based on Cheyenne scribe's personal observations and Cheyenne provider's statements to them.   I, Lurline Del MD, have reviewed Cheyenne above documentation for accuracy and completeness, and I agree with Cheyenne above.   *Total Encounter Time as defined by [place] [facility] for Medicare and Medicaid Services includes, in addition to Cheyenne face-to-face time of a patient visit (documented in Cheyenne note above) non-face-to-face time: obtaining and reviewing outside history, ordering and reviewing medications, tests or procedures, care coordination (communications with other health care professionals or caregivers) and documentation in Cheyenne medical record.

## 2020-11-25 ENCOUNTER — Inpatient Hospital Stay: Payer: Medicare HMO | Admitting: Oncology

## 2020-12-07 ENCOUNTER — Ambulatory Visit: Payer: Medicare HMO | Attending: Surgery

## 2020-12-07 ENCOUNTER — Other Ambulatory Visit: Payer: Self-pay

## 2020-12-07 VITALS — Wt 206.1 lb

## 2020-12-07 DIAGNOSIS — Z483 Aftercare following surgery for neoplasm: Secondary | ICD-10-CM | POA: Insufficient documentation

## 2020-12-07 NOTE — Therapy (Signed)
[place] @ [address] [place], [place], [address] Phone: [phone]   Fax:  [phone]  Physical Therapy Treatment  Patient Details  Name: CAMISHA SREY MRN: [id number] Date of Birth: 1950-05-04 Referring Provider (PT): Dr. [name]   Encounter Date: 12/07/2020   PT End of Session - 12/07/20 1453     Visit Number 5   # unchanged due to screen only   PT Start Time 1779    PT Stop Time 1455    PT Time Calculation (min) 6 min    Activity Tolerance Patient tolerated treatment well    Behavior During Therapy [facility] for tasks assessed/performed             Past Medical History:  Diagnosis Date   Arthritis    gout big toe   Breast cancer ([place])    left breast IDC, right breast DCIS   History of radiation therapy 07/20/2020   bilateral breasts 06/01/2020-07/20/2020  Dr Gery Pray   Obesity    Personal history of chemotherapy    2022 bilat lumpectomies w/rad w/chemo   Personal history of radiation therapy    2022 bilat lumpectomies w/rad w/chemo   Sleep apnea    does not use CPAP    Past Surgical History:  Procedure Laterality Date   ABDOMINAL HYSTERECTOMY     BREAST LUMPECTOMY Bilateral    2022 bilat lumpectomies w/rad w/chemo   BREAST LUMPECTOMY WITH RADIOACTIVE SEED AND SENTINEL LYMPH NODE BIOPSY Left 04/16/2020   Procedure: LEFT BREAST LUMPECTOMY WITH RADIOACTIVE SEED AND LEFT AXILLARY SENTINEL LYMPH NODE BIOPSY;  Surgeon: [name], MD;  Location: [facility];  Service: General;  Laterality: Left;   BREAST LUMPECTOMY WITH RADIOACTIVE SEED LOCALIZATION Right 04/16/2020   Procedure: RIGHT BREAST LUMPECTOMY WITH RADIOACTIVE SEED LOCALIZATION;  Surgeon: [name], MD;  Location: [facility];  Service: General;  Laterality: Right;   IR IMAGING GUIDED PORT INSERTION  10/18/2019   PORT-A-CATH REMOVAL Right 09/02/2020   Procedure: REMOVAL PORT-A-CATH;  Surgeon:  [name], MD;  Location: [facility];  Service: General;  Laterality: Right;    Vitals:   12/07/20 1451  Weight: 206 lb 2 oz (93.5 kg)     Subjective Assessment - 12/07/20 1451     Subjective Pt returns for her 3 month L-Dex screen. "I've been wearing the sleeve some but not much."    Pertinent History Patient was diagnosed on 09/30/2019 with 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 It measures 9 mm and 1.4 cm both located in the upper outer quadrant. Ki67 is 90%; Lt lumpectomy and SLNB and Rt DCIS lumpectomy 04/16/2020                    L-DEX FLOWSHEETS - 12/07/20 1400       L-DEX LYMPHEDEMA SCREENING   Measurement Type Unilateral    L-DEX MEASUREMENT EXTREMITY Upper Extremity    POSITION  Standing    DOMINANT SIDE Left    At Risk Side Right    BASELINE SCORE (UNILATERAL) 3.3    L-DEX SCORE (UNILATERAL) 10.4    VALUE CHANGE (UNILAT) 7.1                                     PT Long Term Goals - 05/29/20 3903  PT LONG TERM GOAL #1   Title Patient will demonstrate she has regained full shoulder ROM and function post op compared to baselines.    Status Achieved      PT LONG TERM GOAL #2   Title Patient will increase left shoulder active flexion to >/= 145 degrees for increased ease reaching overhead.    Status Achieved      PT LONG TERM GOAL #3   Title Patient will increase left shoulder active abduction to >/= 145 degrees for increased ease obtaining radiation positioning.    Status Achieved      PT LONG TERM GOAL #4   Title Patient will report she feels a >/= 50% improvement in her left breast swelling.    Status Achieved      PT LONG TERM GOAL #5   Title Patient will improve her DASH score to be zero (0) for improved overall arm function back to baseline.    Status Achieved      PT LONG TERM GOAL #6   Title Pt will be ind with strength ABC for the  left UE    Status Achieved                   Plan - 12/07/20 1457     Clinical Impression Statement Pt returns for her 3 month L-Dex screen. Her change from baseline of 7.1 is above normal limits so pt will resume wearing her compression sleeve daily for next 30 days and then return for a SOZO screen. She is agreeable to this.    PT Next Visit Plan Pt to return in 1 month for L-Dex screen to determine if her subclinical lymphedema has reversed' if so resume every 3 month L-Dex screens    Consulted and Agree with Plan of Care Patient             Patient will benefit from skilled therapeutic intervention in order to improve the following deficits and impairments:     Visit Diagnosis: Aftercare following surgery for neoplasm     Problem List Patient Active Problem List   Diagnosis Date Noted   Ductal carcinoma in situ (DCIS) of right breast 05/06/2020   Genetic testing 11/04/2019   Port-A-Cath in place 10/22/2019   Malignant neoplasm of upper-outer quadrant of left breast in female, estrogen receptor negative ([place]) 10/03/2019   Unspecified arthropathy, ankle and foot 09/24/2009   HALLUX RIGIDUS 09/24/2009    Otelia Limes, PTA 12/07/2020, 3:00 PM  [place] @ [address] [place], [place], [address] Phone: [phone]   Fax:  [phone]  Name: CLOTILE WHITTINGTON MRN: [id number] Date of Birth: [birth year]

## 2020-12-21 NOTE — Progress Notes (Signed)
[place]  Telephone:([phone]) [phone] Fax:(336) [phone]    ID: Cheyenne Mcdonald DOB: 1950/01/26  MR#: [id number]  OJJ#:[id number]  Patient Care Team: Trey Sailors, PA as PCP - General (Physician Assistant) Servando Salina, MD as Consulting Physician (Obstetrics and Gynecology) Mauro Kaufmann, RN as Oncology Nurse Navigator Rockwell Germany, RN as Oncology Nurse Navigator [name], MD as Consulting Physician (General Surgery) Cindia Hustead, Virgie Dad, MD as Consulting Physician (Oncology) Gery Pray, MD as Consulting Physician (Radiation Oncology) Dimas Aguas, MD as Consulting Physician (Nephrology) Chauncey Cruel, MD OTHER MD:   CHIEF COMPLAINT: 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    She was started on tamoxifen at her last visit on 09/02/2020.  She is having some hot flashes and they occasionally do wake her up at night.  Since her last visit, she underwent bilateral diagnostic mammography  With tomography at [facility] on 09/17/2020 showing: breast density category B; no evidence of malignancy in either breast.    REVIEW OF SYSTEMS: Cheyenne Mcdonald recently returned from a trip to the [place] with 9 other girlfriends and she enjoyed that a great deal.  She mostly walks for exercise but has not been doing it lately she says.  She is concerned about weight gain.  She thinks she needs a little bit of advice on that.  Aside from this a detailed review of systems today was stable   COVID 19 VACCINATION STATUS: Status post [place] x2 with booster October 2021; also had COVID July 2022   HISTORY OF CURRENT ILLNESS: From the original intake note:  Cheyenne Mcdonald presented for her routine mammography with a palpable left breast lump. She underwent bilateral diagnostic  mammography with tomography and left breast ultrasonography at [facility] on 09/13/2019 showing: breast density category B; two adjacent masses in left breast at 1 o'clock, spanning approximately 3.3 cm; one indeterminate left axillary lymph node with 0.4 cm cortical bulge; no evidence right breast malignancy.  Accordingly on 09/27/2019 she proceeded to biopsy of the left breast areas in question. The pathology from this procedure ([id number]) showed: invasive ductal carcinoma, grade 3, present in both of the adjacent masses. Prognostic indicators significant for: estrogen receptor, 0% negative and progesterone receptor, 0% negative. Proliferation marker Ki67 at 90%. HER2 equivocal by immunohistochemistry (2+), but negative by fluorescent in situ hybridization with a signals ratio 2.2 and number per cell 3.3. Additional analysis of more cells showed a signals ratio 2.05 and number per cell 3.13.  The biopsied lymph node was negative for carcinoma.  This was felt to be concordant  The patient's subsequent history is as detailed below.   PAST MEDICAL HISTORY: Past Medical History:  Diagnosis Date   Arthritis    gout big toe   Breast cancer ([place])    left breast IDC, right breast DCIS   History of radiation therapy 07/20/2020   bilateral breasts 06/01/2020-07/20/2020  Dr Gery Pray   Obesity    Personal history of chemotherapy    2022 bilat lumpectomies w/rad w/chemo   Personal history of radiation therapy    2022 bilat lumpectomies w/rad w/chemo   Sleep apnea    does not use CPAP  heart murmur (since childhood)   PAST SURGICAL HISTORY: Past Surgical History:  Procedure Laterality Date   ABDOMINAL HYSTERECTOMY     BREAST  LUMPECTOMY Bilateral    2022 bilat lumpectomies w/rad w/chemo   BREAST LUMPECTOMY WITH RADIOACTIVE SEED AND SENTINEL LYMPH NODE BIOPSY Left 04/16/2020   Procedure: LEFT BREAST LUMPECTOMY WITH RADIOACTIVE SEED AND LEFT AXILLARY SENTINEL LYMPH NODE BIOPSY;  Surgeon:  [name], MD;  Location: [facility];  Service: General;  Laterality: Left;   BREAST LUMPECTOMY WITH RADIOACTIVE SEED LOCALIZATION Right 04/16/2020   Procedure: RIGHT BREAST LUMPECTOMY WITH RADIOACTIVE SEED LOCALIZATION;  Surgeon: [name], MD;  Location: [facility];  Service: General;  Laterality: Right;   IR IMAGING GUIDED PORT INSERTION  10/18/2019   PORT-A-CATH REMOVAL Right 09/02/2020   Procedure: REMOVAL PORT-A-CATH;  Surgeon: [name], MD;  Location: [facility];  Service: General;  Laterality: Right;    FAMILY HISTORY: Family History  Problem Relation Age of Onset   Breast cancer Cousin 77       maternal cousin; bilateral    Her father died at age 74, cause of death unknown. Her mother is age 82 as of 09/2019. Ayisha has two brothers (and no sisters). She reports one cousin with breast cancer at age 36.   GYNECOLOGIC HISTORY:  No LMP recorded. Patient has had a hysterectomy. Menarche: 70 years old Age at first live birth: 70 years old [place] P 2 LMP 1990 Contraceptive: used for maybe 20 years HRT: never used  Hysterectomy? Yes, 1990 BSO? 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 Husband Shanon Mcdonald is retired Nature conservation officer and then retired Civil Service fast streamer.  At home is just the 2 of them. Daughter Cheyenne Mcdonald, age 43, works in Mustang entry in [employer]. Son Cheyenne Mcdonald, age 1, has a college degree but works for [employer] in [employer]. Decklyn has four grandchildren. She attends [employer].    ADVANCED DIRECTIVES: In place   HEALTH MAINTENANCE: Social History   Tobacco Use   Smoking status: Never   Smokeless tobacco: Never  Substance Use Topics   Alcohol use: Never   Drug use: Never     Colonoscopy: 2018 (Dr. Collene Mares)  PAP: approx. 2013  Bone density: 2016, "normal"   Allergies  Allergen Reactions   Shellfish Allergy Itching    Current Outpatient  Medications  Medication Sig Dispense Refill   allopurinol (ZYLOPRIM) 100 MG tablet Take 100 mg by mouth daily. (Patient not taking: Reported on 08/31/2020)     Cholecalciferol (VITAMIN D-3) 125 MCG (5000 UT) TABS 1 cap(s)     No current facility-administered medications for this visit.    OBJECTIVE: African American woman who appears younger than stated age  20:   12/22/20 1420  BP: 137/77  Pulse: 85  Resp: 16  Temp: 97.6 F (36.4 C)  SpO2: 100%      Body mass index is 37.16 kg/m.   Wt Readings from Last 3 Encounters:  12/22/20 209 lb 12.8 oz (95.2 kg)  12/07/20 206 lb 2 oz (93.5 kg)  09/02/20 201 lb 11.5 oz (91.5 kg)     ECOG FS:1 - Symptomatic but completely ambulatory  Sclerae unicteric, EOMs intact Wearing a mask No cervical or supraclavicular adenopathy Lungs no rales or rhonchi Heart regular rate and rhythm Abd soft, obese, nontender, positive bowel sounds MSK no focal spinal tenderness, no upper extremity lymphedema Neuro: nonfocal, well oriented, appropriate affect Breasts: She is status post a bilateral lumpectomies with bilateral radiation.  There is hyperpigmentation but no evidence of local recurrence.  Both axillae  are benign.   LAB RESULTS:  CMP     Component Value Date/Time   NA 137 03/03/2020 0839   K 3.9 03/03/2020 0839   CL 106 03/03/2020 0839   CO2 22 03/03/2020 0839   GLUCOSE 101 (H) 03/03/2020 0839   BUN 14 03/03/2020 0839   CREATININE 1.05 (H) 03/03/2020 0839   CALCIUM 9.0 03/03/2020 0839   PROT 6.7 03/03/2020 0839   ALBUMIN 4.0 03/03/2020 0839   AST 13 (L) 03/03/2020 0839   ALT 11 03/03/2020 0839   ALKPHOS 62 03/03/2020 0839   BILITOT 0.4 03/03/2020 0839   GFRNONAA 57 (L) 03/03/2020 0839   GFRAA 58 (L) 10/09/2019 0806    No results found for: TOTALPROTELP, ALBUMINELP, A1GS, A2GS, BETS, BETA2SER, GAMS, MSPIKE, SPEI  Lab Results  Component Value Date   WBC 2.4 (L) 03/03/2020   NEUTROABS 1.5 (L) 03/03/2020   HGB 10.5 (L)  03/03/2020   HCT 29.4 (L) 03/03/2020   MCV 97.4 03/03/2020   PLT 144 (L) 03/03/2020    No results found for: LABCA2  No components found for: RXVQMG867  No results for input(s): INR in the last 168 hours.  No results found for: LABCA2  No results found for: YPP509  No results found for: TOI712  No results found for: WPY099  No results found for: CA2729  No components found for: HGQUANT  No results found for: CEA1 / No results found for: CEA1   No results found for: AFPTUMOR  No results found for: CHROMOGRNA  No results found for: KPAFRELGTCHN, LAMBDASER, KAPLAMBRATIO (kappa/lambda light chains)  No results found for: HGBA, HGBA2QUANT, HGBFQUANT, HGBSQUAN (Hemoglobinopathy evaluation)   No results found for: LDH  No results found for: IRON, TIBC, IRONPCTSAT (Iron and TIBC)  No results found for: FERRITIN  Urinalysis No results found for: COLORURINE, APPEARANCEUR, LABSPEC, PHURINE, GLUCOSEU, HGBUR, BILIRUBINUR, KETONESUR, PROTEINUR, UROBILINOGEN, NITRITE, LEUKOCYTESUR   STUDIES: No results found.   ELIGIBLE FOR AVAILABLE RESEARCH PROTOCOL: AET  ASSESSMENT: 70 y.o. [place] woman status post left breast upper outer quadrant biopsy 09/27/2019 for a clinically T1-T2 N0, stage IA- IIA invasive ductal carcinoma, grade 3, triple negative, with an MIB-1 of 90%.  (1) genetics testing 10/30/2019 through the [facility] Common Hereditary Cancers Panel found no deleterious mutations in APC, ATM, AXIN2, BARD1, BMPR1A, BRCA1, BRCA2, BRIP1, CDH1, CDK4, CDKN2A (p14ARF), CDKN2A (p16INK4a), CHEK2, CTNNA1, DICER1, EPCAM (Deletion/duplication testing only), GREM1 (promoter region deletion/duplication testing only), KIT, MEN1, MLH1, MSH2, MSH3, MSH6, MUTYH, NBN, NF1, NHTL1, PALB2, PDGFRA, PMS2, POLD1, POLE, PTEN, RAD50, RAD51C, RAD51D, RNF43, SDHB, SDHC, SDHD, SMAD4, SMARCA4. STK11, TP53, TSC1, TSC2, and VHL.  The following genes were evaluated for sequence changes only: SDHA and  HOXB13 c.251G>A variant only.  LEFT BREAST: (2) neoadjuvant chemotherapy consisting of doxorubicin and cyclophosphamide in dose dense fashion x4 starting 10/22/2019, completed 12/03/2019,followed by weekly carboplatin and paclitaxel x12 started 12/17/2019, last dose 02/25/2020  (a) echo 10/17/2019 shows an ejection fraction in the 60-65% range  (b) final 2 doses of carboplatinum/paclitaxel omitted with grade 1 neuropathy  (3) status post left lumpectomy and sentinel lymph node sampling 04/16/2020 for a ypT0 ypN0, complete pathologic response  (a) a total of 3 left axillary lymph nodes were removed  RIGHT BREAST: (4) multicentric biopsy x2 on the right (contralateral) breast 02/27/2020 shows ductal carcinoma in situ, estrogen and progesterone receptor strongly positive  (5) right lumpectomy 04/16/2020 shows a residual 1.4 cm of ductal carcinoma in situ, grade 1, with close but negative margins  (  6) adjuvant radiation to both breasts 06/01/2020 through 07/20/2020 Site Technique Total Dose (Gy) Dose per Fx (Gy) Completed Fx Beam Energies  Breast, Left: Breast_Lt 3D 50.4/50.4 1.8 28/28 10X  Breast, Left: Breast_Lt_Bst 3D 10/10 2 5/5 6X, 10X  Breast, Right: Breast_Rt 3D 50.4/50.4 1.8 28/28 10X  Breast, Right: Breast_Rt_Bst 3D 12/12 2 6/6 6X, 10X   (7) tamoxifen started 09/02/2020   PLAN: Arlicia is half year out from definitive surgery for her bilateral breast cancers.  There is no evidence of local recurrence.  She is tolerating tamoxifen well and the plan will be to continue that a minimum of 5 years.  She has joined the sister's network and she is getting a great deal of support from that.  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  She would like to lose 50 pounds she says.  I gave her information on [facility]'s weight loss program and we also discussed diet and exercise issues in detail today.  She will see Dr. Ninfa Linden sometime in February.  She will see [place]  again in August.  She will have her mammography before that visit  Total encounter time 25 minutes.Sarajane Jews C. Zelda Reames, MD 12/22/2020 2:30 PM Medical Oncology and Hematology [facility] [place],  [address] Tel. [phone]    Fax. [phone]   This document serves as a record of services personally performed by Lurline Del, MD. It was created on his behalf by Wilburn Mylar, a trained medical scribe. The creation of this record is based on the scribe's personal observations and the provider's statements to them.   I, Lurline Del MD, have reviewed the above documentation for accuracy and completeness, and I agree with the above.   *Total Encounter Time as defined by the Centers for Medicare and Medicaid Services includes, in addition to the face-to-face time of a patient visit (documented in the note above) non-face-to-face time: obtaining and reviewing outside history, ordering and reviewing medications, tests or procedures, care coordination (communications with other health care professionals or caregivers) and documentation in the medical record.

## 2020-12-22 ENCOUNTER — Inpatient Hospital Stay: Payer: Medicare HMO | Attending: Oncology | Admitting: Oncology

## 2020-12-22 ENCOUNTER — Other Ambulatory Visit: Payer: Self-pay

## 2020-12-22 VITALS — BP 137/77 | HR 85 | Temp 97.6°F | Resp 16 | Ht 63.0 in | Wt 209.8 lb

## 2020-12-22 DIAGNOSIS — C50412 Malignant neoplasm of upper-outer quadrant of left female breast: Secondary | ICD-10-CM

## 2020-12-22 DIAGNOSIS — Z170 Estrogen receptor positive status [ER+]: Secondary | ICD-10-CM | POA: Insufficient documentation

## 2020-12-22 DIAGNOSIS — Z79810 Long term (current) use of selective estrogen receptor modulators (SERMs): Secondary | ICD-10-CM | POA: Insufficient documentation

## 2020-12-22 DIAGNOSIS — Z79899 Other long term (current) drug therapy: Secondary | ICD-10-CM | POA: Diagnosis not present

## 2020-12-22 DIAGNOSIS — D0510 Intraductal carcinoma in situ of unspecified breast: Secondary | ICD-10-CM | POA: Insufficient documentation

## 2020-12-22 DIAGNOSIS — Z171 Estrogen receptor negative status [ER-]: Secondary | ICD-10-CM

## 2020-12-22 DIAGNOSIS — D0511 Intraductal carcinoma in situ of right breast: Secondary | ICD-10-CM | POA: Diagnosis not present

## 2020-12-22 DIAGNOSIS — Z923 Personal history of irradiation: Secondary | ICD-10-CM | POA: Diagnosis not present

## 2020-12-22 DIAGNOSIS — Z853 Personal history of malignant neoplasm of breast: Secondary | ICD-10-CM | POA: Insufficient documentation

## 2020-12-22 DIAGNOSIS — R635 Abnormal weight gain: Secondary | ICD-10-CM | POA: Insufficient documentation

## 2020-12-22 DIAGNOSIS — M109 Gout, unspecified: Secondary | ICD-10-CM | POA: Diagnosis not present

## 2020-12-22 DIAGNOSIS — Z9221 Personal history of antineoplastic chemotherapy: Secondary | ICD-10-CM | POA: Insufficient documentation

## 2021-01-11 ENCOUNTER — Encounter: Payer: Self-pay | Admitting: Oncology

## 2021-01-18 ENCOUNTER — Other Ambulatory Visit: Payer: Self-pay

## 2021-01-18 ENCOUNTER — Ambulatory Visit: Payer: Medicare PPO | Attending: Surgery

## 2021-01-18 VITALS — Wt 212.5 lb

## 2021-01-18 DIAGNOSIS — Z483 Aftercare following surgery for neoplasm: Secondary | ICD-10-CM

## 2021-01-18 NOTE — Therapy (Signed)
[facility] @ [place], [place], [address] Phone: [phone]   Fax:  [phone]  Physical Therapy Treatment  Patient Details  Name: Cheyenne Mcdonald MRN: [id number] Date of Birth: [birth year] Referring Provider (PT): Dr. [name]   Encounter Date: 01/18/2021   PT End of Session - 01/18/21 0931     Visit Number 5   # unchanged due to screen only   PT Start Time 0928    PT Stop Time 0949    PT Time Calculation (min) 21 min    Activity Tolerance Patient tolerated treatment well    Behavior During Therapy [facility] for tasks assessed/performed             Past Medical History:  Diagnosis Date   Arthritis    gout big toe   Breast cancer ([place])    left breast IDC, right breast DCIS   History of radiation therapy 07/20/2020   bilateral breasts 06/01/2020-07/20/2020  Dr Gery Pray   Obesity    Personal history of chemotherapy    2022 bilat lumpectomies w/rad w/chemo   Personal history of radiation therapy    2022 bilat lumpectomies w/rad w/chemo   Sleep apnea    does not use CPAP    Past Surgical History:  Procedure Laterality Date   ABDOMINAL HYSTERECTOMY     BREAST LUMPECTOMY Bilateral    2022 bilat lumpectomies w/rad w/chemo   BREAST LUMPECTOMY WITH RADIOACTIVE SEED AND SENTINEL LYMPH NODE BIOPSY Left 04/16/2020   Procedure: LEFT BREAST LUMPECTOMY WITH RADIOACTIVE SEED AND LEFT AXILLARY SENTINEL LYMPH NODE BIOPSY;  Surgeon: [name], MD;  Location: [facility];  Service: General;  Laterality: Left;   BREAST LUMPECTOMY WITH RADIOACTIVE SEED LOCALIZATION Right 04/16/2020   Procedure: RIGHT BREAST LUMPECTOMY WITH RADIOACTIVE SEED LOCALIZATION;  Surgeon: [name], MD;  Location: [facility];  Service: General;  Laterality: Right;   IR IMAGING GUIDED PORT INSERTION  10/18/2019   PORT-A-CATH REMOVAL Right 09/02/2020   Procedure: REMOVAL PORT-A-CATH;  Surgeon:  [name], MD;  Location: [facility];  Service: General;  Laterality: Right;    Vitals:   01/18/21 0931  Weight: 212 lb 8 oz (96.4 kg)     Subjective Assessment - 01/18/21 0928     Subjective Pt returns for her 3 month L-Dex screen. "I didn't wear my sleeve as much as I should have obe the holidays and now I see some swelling in my hand. My rings were even tight yesterday."    Pertinent History Patient was diagnosed on 09/30/2019 with 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 It measures 9 mm and 1.4 cm both located in the upper outer quadrant. Ki67 is 90%; Lt lumpectomy and SLNB and Rt DCIS lumpectomy 04/16/2020                    L-DEX FLOWSHEETS - 01/18/21 0900       L-DEX LYMPHEDEMA SCREENING   Measurement Type Unilateral    L-DEX MEASUREMENT EXTREMITY Upper Extremity    POSITION  Standing    DOMINANT SIDE Left    At Risk Side Right    BASELINE SCORE (UNILATERAL) 3.3    L-DEX SCORE (UNILATERAL) 9.9    VALUE CHANGE (UNILAT) 6.6  PT Long Term Goals - 05/29/20 0957       PT LONG TERM GOAL #1   Title Patient will demonstrate she has regained full shoulder ROM and function post op compared to baselines.    Status Achieved      PT LONG TERM GOAL #2   Title Patient will increase left shoulder active flexion to >/= 145 degrees for increased ease reaching overhead.    Status Achieved      PT LONG TERM GOAL #3   Title Patient will increase left shoulder active abduction to >/= 145 degrees for increased ease obtaining radiation positioning.    Status Achieved      PT LONG TERM GOAL #4   Title Patient will report she feels a >/= 50% improvement in her left breast swelling.    Status Achieved      PT LONG TERM GOAL #5   Title Patient will improve her DASH score to be zero (0) for improved overall arm function back to baseline.     Status Achieved      PT LONG TERM GOAL #6   Title Pt will be ind with strength ABC for the left UE    Status Achieved                   Plan - 01/18/21 0950     Clinical Impression Statement Pt returns for her 3 month L-Dex screen. Her change from baseline of 6.6 still puts her in the subclinical lymphedema range. She repotrs did not wear her compression sleeve consistently during the holidays as she just was busy and forgot but did start to wear it consistently over past week. She reports just yesterday noticing some back of the hand swelling and her wedding rings felt tight so also issued her a copression glove, size IV in balck today to wear with her compression sleeve. Also decided to screen her again in another month with instructions to wear her compression daily and pt verbalizes good understanding of this. May also consider initiating treatment at that time, per discussion with Allyson Sabal Blu, PT as pt has tested in subclinical range 3x now. Treatment will be to instruct pt in self MLD to further support her lymphatic system in addition to her wearing compression more consistently now. Pt was agreeable to all discussed today.    PT Next Visit Plan Pt to return in 1 month for L-Dex screen to determine if her subclinical lymphedema has reversed. Probable start of treatment at that time as pt has scored in subclincal range 3x now to instruct her in self mLD and further assess compression needs.    Consulted and Agree with Plan of Care Patient             Patient will benefit from skilled therapeutic intervention in order to improve the following deficits and impairments:     Visit Diagnosis: Aftercare following surgery for neoplasm     Problem List Patient Active Problem List   Diagnosis Date Noted   Ductal carcinoma in situ (DCIS) of right breast 05/06/2020   Genetic testing 11/04/2019   Port-A-Cath in place 10/22/2019   Malignant neoplasm of upper-outer  quadrant of left breast in female, estrogen receptor negative ([place]) 10/03/2019   Unspecified arthropathy, ankle and foot 09/24/2009   HALLUX RIGIDUS 09/24/2009    Otelia Limes, PTA 01/18/2021, 10:04 AM  [facility] @ [place], [place], [address] Phone: [phone]  Fax:  [phone]  Name: MIRAL HOOPES MRN: [id number] Date of Birth: Oct 29, 1950

## 2021-02-15 ENCOUNTER — Encounter: Payer: Self-pay | Admitting: Oncology

## 2021-02-22 ENCOUNTER — Other Ambulatory Visit: Payer: Self-pay

## 2021-02-22 ENCOUNTER — Ambulatory Visit: Payer: Medicare HMO | Attending: Surgery

## 2021-02-22 ENCOUNTER — Other Ambulatory Visit: Payer: Self-pay | Admitting: *Deleted

## 2021-02-22 VITALS — Wt 210.1 lb

## 2021-02-22 DIAGNOSIS — Z483 Aftercare following surgery for neoplasm: Secondary | ICD-10-CM | POA: Insufficient documentation

## 2021-02-22 DIAGNOSIS — M25612 Stiffness of left shoulder, not elsewhere classified: Secondary | ICD-10-CM | POA: Insufficient documentation

## 2021-02-22 DIAGNOSIS — I890 Lymphedema, not elsewhere classified: Secondary | ICD-10-CM | POA: Insufficient documentation

## 2021-02-22 DIAGNOSIS — C50412 Malignant neoplasm of upper-outer quadrant of left female breast: Secondary | ICD-10-CM

## 2021-02-22 DIAGNOSIS — R293 Abnormal posture: Secondary | ICD-10-CM | POA: Insufficient documentation

## 2021-02-22 NOTE — Therapy (Signed)
[facility] @ [place], [place], [address] Phone: [phone]   Fax:  [phone]  Physical Therapy Treatment  Patient Details  Name: Cheyenne Mcdonald MRN: [id number] Date of Birth: 08/16/1950 Referring Provider (PT): Dr. [name]   Encounter Date: 02/22/2021   PT End of Session - 02/22/21 0928     Visit Number 5   # unchanged due to screen only   PT Start Time 1655    PT Stop Time 0931    PT Time Calculation (min) 6 min    Activity Tolerance Patient tolerated treatment well    Behavior During Therapy [facility] for tasks assessed/performed             Past Medical History:  Diagnosis Date   Arthritis    gout big toe   Breast cancer ([place])    left breast IDC, right breast DCIS   History of radiation therapy 07/20/2020   bilateral breasts 06/01/2020-07/20/2020  Dr Gery Pray   Obesity    Personal history of chemotherapy    2022 bilat lumpectomies w/rad w/chemo   Personal history of radiation therapy    2022 bilat lumpectomies w/rad w/chemo   Sleep apnea    does not use CPAP    Past Surgical History:  Procedure Laterality Date   ABDOMINAL HYSTERECTOMY     BREAST LUMPECTOMY Bilateral    2022 bilat lumpectomies w/rad w/chemo   BREAST LUMPECTOMY WITH RADIOACTIVE SEED AND SENTINEL LYMPH NODE BIOPSY Left 04/16/2020   Procedure: LEFT BREAST LUMPECTOMY WITH RADIOACTIVE SEED AND LEFT AXILLARY SENTINEL LYMPH NODE BIOPSY;  Surgeon: [name], MD;  Location: [facility];  Service: General;  Laterality: Left;   BREAST LUMPECTOMY WITH RADIOACTIVE SEED LOCALIZATION Right 04/16/2020   Procedure: RIGHT BREAST LUMPECTOMY WITH RADIOACTIVE SEED LOCALIZATION;  Surgeon: [name], MD;  Location: [facility];  Service: General;  Laterality: Right;   IR IMAGING GUIDED PORT INSERTION  10/18/2019   PORT-A-CATH REMOVAL Right 09/02/2020   Procedure: REMOVAL PORT-A-CATH;  Surgeon:  [name], MD;  Location: [facility];  Service: General;  Laterality: Right;    Vitals:   02/22/21 0926  Weight: 210 lb 2 oz (95.3 kg)     Subjective Assessment - 02/22/21 0926     Subjective Pt returns for her 3 month L-Dex screen. "I've been wearing the sleeve every day!"    Pertinent History Patient was diagnosed on 09/30/2019 with 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 It measures 9 mm and 1.4 cm both located in the upper outer quadrant. Ki67 is 90%; Lt lumpectomy and SLNB and Rt DCIS lumpectomy 04/16/2020                    L-DEX FLOWSHEETS - 02/22/21 0900       L-DEX LYMPHEDEMA SCREENING   Measurement Type Unilateral    L-DEX MEASUREMENT EXTREMITY Upper Extremity    POSITION  Standing    DOMINANT SIDE Left    At Risk Side Right    BASELINE SCORE (UNILATERAL) 3.3    L-DEX SCORE (UNILATERAL) 10.5    VALUE CHANGE (UNILAT) 7.2                                     PT Long Term Goals - 05/29/20 3748  PT LONG TERM GOAL #1   Title Patient will demonstrate she has regained full shoulder ROM and function post op compared to baselines.    Status Achieved      PT LONG TERM GOAL #2   Title Patient will increase left shoulder active flexion to >/= 145 degrees for increased ease reaching overhead.    Status Achieved      PT LONG TERM GOAL #3   Title Patient will increase left shoulder active abduction to >/= 145 degrees for increased ease obtaining radiation positioning.    Status Achieved      PT LONG TERM GOAL #4   Title Patient will report she feels a >/= 50% improvement in her left breast swelling.    Status Achieved      PT LONG TERM GOAL #5   Title Patient will improve her DASH score to be zero (0) for improved overall arm function back to baseline.    Status Achieved      PT LONG TERM GOAL #6   Title Pt will be ind with strength ABC for the left UE     Status Achieved                   Plan - 02/22/21 0928     Clinical Impression Statement Pt returns for her 3 month L-Dex screen. Her change from baseline of 7.2 is still indicating subclinical lymphedema despite beginning to wear her compression sleeve in [place], then regularly in Jan. Since pt continues to score in subclinical lymphedema range despite wear of compression sleeve and glove, this prompted PTA consulting with a PT. PT determined it would be appropriate to initiate therapy at this time. PT requested a referral from patient's provider.   PT Next Visit Plan Pt to return in 1 month for L-Dex screen to determine if her subclinical lymphedema has reversed. Eval to instruct pt in self manual lymph drainage to Lt UE as she has continued to score in subclinial range for a total of 4x now and further assess compression needs (juzo dynamic or flat knit??)    Consulted and Agree with Plan of Care Patient             Patient will benefit from skilled therapeutic intervention in order to improve the following deficits and impairments:     Visit Diagnosis: Aftercare following surgery for neoplasm     Problem List Patient Active Problem List   Diagnosis Date Noted   Ductal carcinoma in situ (DCIS) of right breast 05/06/2020   Genetic testing 11/04/2019   Port-A-Cath in place 10/22/2019   Malignant neoplasm of upper-outer quadrant of left breast in female, estrogen receptor negative ([place]) 10/03/2019   Unspecified arthropathy, ankle and foot 09/24/2009   HALLUX RIGIDUS 09/24/2009    Otelia Limes, PTA 02/22/2021, 12:33 PM  [facility] @ [place], [place], [address] Phone: [phone]   Fax:  [phone]  Name: Cheyenne Mcdonald MRN: [id number] Date of Birth: May 06, 1950

## 2021-02-26 ENCOUNTER — Encounter: Payer: Self-pay | Admitting: Rehabilitation

## 2021-02-26 ENCOUNTER — Other Ambulatory Visit: Payer: Self-pay

## 2021-02-26 ENCOUNTER — Ambulatory Visit: Payer: Medicare HMO | Attending: Hematology and Oncology | Admitting: Rehabilitation

## 2021-02-26 DIAGNOSIS — Z483 Aftercare following surgery for neoplasm: Secondary | ICD-10-CM | POA: Diagnosis not present

## 2021-02-26 DIAGNOSIS — M25612 Stiffness of left shoulder, not elsewhere classified: Secondary | ICD-10-CM | POA: Insufficient documentation

## 2021-02-26 DIAGNOSIS — Z171 Estrogen receptor negative status [ER-]: Secondary | ICD-10-CM | POA: Insufficient documentation

## 2021-02-26 DIAGNOSIS — C50412 Malignant neoplasm of upper-outer quadrant of left female breast: Secondary | ICD-10-CM | POA: Diagnosis not present

## 2021-02-26 DIAGNOSIS — R293 Abnormal posture: Secondary | ICD-10-CM | POA: Insufficient documentation

## 2021-02-26 DIAGNOSIS — I890 Lymphedema, not elsewhere classified: Secondary | ICD-10-CM

## 2021-02-26 NOTE — Therapy (Deleted)
[facility] @ [place] [address] [place], [place], [address] Phone: [phone]   Fax:  [phone]  Physical Therapy Treatment  Patient Details  Name: Cheyenne Mcdonald MRN: [id number] Date of Birth: [birth year] Referring Provider (PT): Dr. Abigail Miyamoto   Encounter Date: 02/26/2021   PT End of Session - 02/26/21 1002     Visit Number 6    Number of Visits 10    Date for PT Re-Evaluation 04/09/21    Authorization Type [employer] - update Auth when approved    Progress Note Due on Visit 10    PT Start Time 1005    Activity Tolerance Patient tolerated treatment well    Behavior During Therapy [facility] for tasks assessed/performed             Past Medical History:  Diagnosis Date   Arthritis    gout big toe   Breast cancer (HCC)    left breast IDC, right breast DCIS   History of radiation therapy 07/20/2020   bilateral breasts 06/01/2020-07/20/2020  Dr Antony Blackbird   Obesity    Personal history of chemotherapy    2022 bilat lumpectomies w/rad w/chemo   Personal history of radiation therapy    2022 bilat lumpectomies w/rad w/chemo   Sleep apnea    does not use CPAP    Past Surgical History:  Procedure Laterality Date   ABDOMINAL HYSTERECTOMY     BREAST LUMPECTOMY Bilateral    2022 bilat lumpectomies w/rad w/chemo   BREAST LUMPECTOMY WITH RADIOACTIVE SEED AND SENTINEL LYMPH NODE BIOPSY Left 04/16/2020   Procedure: LEFT BREAST LUMPECTOMY WITH RADIOACTIVE SEED AND LEFT AXILLARY SENTINEL LYMPH NODE BIOPSY;  Surgeon: Abigail Miyamoto, MD;  Location: [facility];  Service: General;  Laterality: Left;   BREAST LUMPECTOMY WITH RADIOACTIVE SEED LOCALIZATION Right 04/16/2020   Procedure: RIGHT BREAST LUMPECTOMY WITH RADIOACTIVE SEED LOCALIZATION;  Surgeon: Abigail Miyamoto, MD;  Location: [facility];  Service: General;  Laterality: Right;   IR IMAGING GUIDED PORT INSERTION  10/18/2019   PORT-A-CATH REMOVAL  Right 09/02/2020   Procedure: REMOVAL PORT-A-CATH;  Surgeon: Abigail Miyamoto, MD;  Location: [facility];  Service: General;  Laterality: Right;    There were no vitals filed for this visit.   Subjective Assessment - 02/26/21 1003     Subjective It is just swelling.  What else should I do?    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 It measures 9 mm and 1.4 cm both located in the upper outer quadrant. Ki67 is 90%; Lt lumpectomy and SLNB and Rt DCIS lumpectomy 04/16/2020. Completed radiation    Patient Stated Goals anything for the swelling    Currently in Pain? No/denies                   LYMPHEDEMA/ONCOLOGY QUESTIONNAIRE - 02/26/21 0001       Right Upper Extremity Lymphedema   15 cm Proximal to Olecranon Process 38 cm    10 cm Proximal to Olecranon Process 35.3 cm    Olecranon Process 28 cm    10 cm Proximal to Ulnar Styloid Process 22.3 cm    Just Proximal to Ulnar Styloid Process 17.2 cm    Across Hand at [employer] 20.7 cm    At [place] of 2nd Digit 7 cm      Left Upper Extremity Lymphedema  15 cm Proximal to Olecranon Process 38 cm    10 cm Proximal to Olecranon Process 36 cm    Olecranon Process 28 cm    10 cm Proximal to Ulnar Styloid Process 22.5 cm    Just Proximal to Ulnar Styloid Process 17.2 cm    Across Hand at [employer] 20.7 cm    At [place] of 2nd Digit 6.8 cm                Quick Dash - 02/26/21 0001     Open a tight or new jar No difficulty    Do heavy household chores (wash walls, wash floors) No difficulty    Carry a shopping bag or briefcase No difficulty    Wash your back No difficulty    Use a knife to cut food No difficulty    Recreational activities in which you take some force or impact through your arm, shoulder, or hand (golf, hammering, tennis) No difficulty    During the past week, to what extent  has your arm, shoulder or hand problem interfered with your normal social activities with family, friends, neighbors, or groups? Not at all    During the past week, to what extent has your arm, shoulder or hand problem limited your work or other regular daily activities Not at all    Arm, shoulder, or hand pain. None    Tingling (pins and needles) in your arm, shoulder, or hand None    Difficulty Sleeping No difficulty    DASH Score 0 %                    [facility] Adult PT Treatment/Exercise - 02/26/21 0001       Manual Therapy   Manual Therapy Edema management;Manual Lymphatic Drainage (MLD)    Edema Management remeasured bil UE circumferences which are not any different between sides.  Pt has been wearing the medi comfort and harmony gauntlet so pt was switched to a harmony matching sleeve which has more containment.  Pt noted that this felt more containing and liked the fit. Discussed other options if this does not work including class 2 or flat knit.    Manual Lymphatic Drainage (MLD) Education on lymphatic anatomy and rationale behind MLD and then demonstrated and gave vcs for each step per instruction section.  Vcs mainly for decreasing hand pressure and to focus on skin stretch and not slide.  Pt performs this well upon leaving and feels like she will do well ind for now.                     PT Education - 02/26/21 1106     Education Details self MLD    Person(s) Educated Patient    Methods Explanation;Demonstration;Tactile cues;Verbal cues;Handout    Comprehension Verbalized understanding;Returned demonstration;Verbal cues required;Tactile cues required              PT Short Term Goals - 02/26/21 1107       PT SHORT TERM GOAL #1   Title Pt will be ind with self MLD for the Rt UE    Time 2    Period Weeks    Status New               PT Long Term Goals - 05/29/20 0957       PT LONG TERM GOAL #1   Title Patient will demonstrate she has  regained full shoulder ROM and function  post op compared to baselines.    Status Achieved      PT LONG TERM GOAL #2   Title Patient will increase left shoulder active flexion to >/= 145 degrees for increased ease reaching overhead.    Status Achieved      PT LONG TERM GOAL #3   Title Patient will increase left shoulder active abduction to >/= 145 degrees for increased ease obtaining radiation positioning.    Status Achieved      PT LONG TERM GOAL #4   Title Patient will report she feels a >/= 50% improvement in her left breast swelling.    Status Achieved      PT LONG TERM GOAL #5   Title Patient will improve her DASH score to be zero (0) for improved overall arm function back to baseline.    Status Achieved      PT LONG TERM GOAL #6   Title Pt will be ind with strength ABC for the left UE    Status Achieved                   Plan - 02/26/21 1103     Clinical Impression Statement Pt returns after continuing to score higher on her SOZO scans.  She has yet to be in the red and remains in subclinical status.  She has no pitting or visible swelling.  No circumferential differences between sides.  Pt was upgraded to a medi harmony sleeve vs her current comfort nad was educated on donning again as she was not pulling it up very high.  Also reminded pt of the importance of activity overall.  Educated pt on self MLD which she performed very well with cueing and instruction.  Decided to have pt check in in 10 days for repeat SOZO and to see if that new sleeve is working well and to check MLD.    Stability/Clinical Decision Making Evolving/Moderate complexity    Clinical Decision Making Moderate    Rehab Potential Excellent    PT Frequency Biweekly   will continue with SOZO scans monthly and then every 3 months per original POC.   PT Duration 4 weeks    PT Treatment/Interventions ADLs/Self Care Home Management;Therapeutic exercise;Scar mobilization;Passive range of  motion;Patient/family education;Manual techniques    PT Next Visit Plan How was new sleeve? need class 2 or different? repeat SOZO, review MLD    Consulted and Agree with Plan of Care Patient             Patient will benefit from skilled therapeutic intervention in order to improve the following deficits and impairments:  Postural dysfunction, Decreased range of motion, Decreased knowledge of precautions, Impaired UE functional use, Pain, Increased edema, Decreased scar mobility  Visit Diagnosis: Aftercare following surgery for neoplasm  Abnormal posture  Malignant neoplasm of upper-outer quadrant of left breast in female, estrogen receptor negative ([place])  Localized edema  Stiffness of left shoulder, not elsewhere classified     Problem List Patient Active Problem List   Diagnosis Date Noted   Ductal carcinoma in situ (DCIS) of right breast 05/06/2020   Genetic testing 11/04/2019   Port-A-Cath in place 10/22/2019   Malignant neoplasm of upper-outer quadrant of left breast in female, estrogen receptor negative ([place]) 10/03/2019   Unspecified arthropathy, ankle and foot 09/24/2009   HALLUX RIGIDUS 09/24/2009    Stark Bray, PT 02/26/2021, 11:07 AM  [facility] @ [place], [place],  [address] Phone: [phone]   Fax:  [phone]  Name: CARRIE USERY MRN: [id number] Date of Birth: 1950-08-07

## 2021-02-26 NOTE — Therapy (Signed)
[facility] @ [place], [place], [address] Phone: [phone]   Fax:  [phone]  Physical Therapy Evaluation  Patient Details  Name: Cheyenne Mcdonald MRN: [id number] Date of Birth: 16-Oct-1950 Referring Provider (PT): Dr. Chryl Heck   Encounter Date: 02/26/2021   PT End of Session - 02/26/21 1002     Visit Number 6    Number of Visits 10    Date for PT Re-Evaluation 04/09/21    Authorization Type [employer] - update Auth when approved    Progress Note Due on Visit 10    PT Start Time 1005    Activity Tolerance Patient tolerated treatment well    Behavior During Therapy [facility] for tasks assessed/performed             Past Medical History:  Diagnosis Date   Arthritis    gout big toe   Breast cancer ([place])    left breast IDC, right breast DCIS   History of radiation therapy 07/20/2020   bilateral breasts 06/01/2020-07/20/2020  Dr Gery Pray   Obesity    Personal history of chemotherapy    2022 bilat lumpectomies w/rad w/chemo   Personal history of radiation therapy    2022 bilat lumpectomies w/rad w/chemo   Sleep apnea    does not use CPAP    Past Surgical History:  Procedure Laterality Date   ABDOMINAL HYSTERECTOMY     BREAST LUMPECTOMY Bilateral    2022 bilat lumpectomies w/rad w/chemo   BREAST LUMPECTOMY WITH RADIOACTIVE SEED AND SENTINEL LYMPH NODE BIOPSY Left 04/16/2020   Procedure: LEFT BREAST LUMPECTOMY WITH RADIOACTIVE SEED AND LEFT AXILLARY SENTINEL LYMPH NODE BIOPSY;  Surgeon: [name], MD;  Location: [facility];  Service: General;  Laterality: Left;   BREAST LUMPECTOMY WITH RADIOACTIVE SEED LOCALIZATION Right 04/16/2020   Procedure: RIGHT BREAST LUMPECTOMY WITH RADIOACTIVE SEED LOCALIZATION;  Surgeon: [name], MD;  Location: [facility];  Service: General;  Laterality: Right;   IR IMAGING GUIDED PORT INSERTION  10/18/2019   PORT-A-CATH REMOVAL Right  09/02/2020   Procedure: REMOVAL PORT-A-CATH;  Surgeon: [name], MD;  Location: [facility];  Service: General;  Laterality: Right;    There were no vitals filed for this visit.    Subjective Assessment - 02/26/21 1003     Subjective It is just swelling.  What else should I do?    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 It measures 9 mm and 1.4 cm both located in the upper outer quadrant. Ki67 is 90%; Lt lumpectomy and SLNB and Rt DCIS lumpectomy 04/16/2020. Completed radiation    Patient Stated Goals anything for the swelling    Currently in Pain? No/denies                [facility] PT Assessment - 02/26/21 0001       Assessment   Referring Provider (PT) Dr. Chryl Heck    Onset Date/Surgical Date 04/16/20    Hand Dominance Right    Prior Therapy yes               LYMPHEDEMA/ONCOLOGY QUESTIONNAIRE - 02/26/21 0001       Right Upper Extremity Lymphedema   15 cm Proximal to Olecranon Process 38 cm    10 cm Proximal to Olecranon Process 35.3 cm    Olecranon Process 28 cm    10 cm  Proximal to Ulnar Styloid Process 22.3 cm    Just Proximal to Ulnar Styloid Process 17.2 cm    Across Hand at [employer] 20.7 cm    At [place] of 2nd Digit 7 cm      Left Upper Extremity Lymphedema   15 cm Proximal to Olecranon Process 38 cm    10 cm Proximal to Olecranon Process 36 cm    Olecranon Process 28 cm    10 cm Proximal to Ulnar Styloid Process 22.5 cm    Just Proximal to Ulnar Styloid Process 17.2 cm    Across Hand at [employer] 20.7 cm    At [place] of 2nd Digit 6.8 cm                   Quick Dash - 02/26/21 0001     Open a tight or new jar No difficulty    Do heavy household chores (wash walls, wash floors) No difficulty    Carry a shopping bag or briefcase No difficulty    Wash your back No difficulty    Use a knife to cut food  No difficulty    Recreational activities in which you take some force or impact through your arm, shoulder, or hand (golf, hammering, tennis) No difficulty    During the past week, to what extent has your arm, shoulder or hand problem interfered with your normal social activities with family, friends, neighbors, or groups? Not at all    During the past week, to what extent has your arm, shoulder or hand problem limited your work or other regular daily activities Not at all    Arm, shoulder, or hand pain. None    Tingling (pins and needles) in your arm, shoulder, or hand None    Difficulty Sleeping No difficulty    DASH Score 0 %              Objective measurements completed on examination: See above findings.       [facility] Adult PT Treatment/Exercise - 02/26/21 0001       Manual Therapy   Manual Therapy Edema management;Manual Lymphatic Drainage (MLD)    Edema Management remeasured bil UE circumferences which are not any different between sides.  Pt has been wearing the medi comfort and harmony gauntlet so pt was switched to a harmony matching sleeve which has more containment.  Pt noted that this felt more containing and liked the fit. Discussed other options if this does not work including class 2 or flat knit.    Manual Lymphatic Drainage (MLD) Education on lymphatic anatomy and rationale behind MLD and then demonstrated and gave vcs for each step per instruction section.  Vcs mainly for decreasing hand pressure and to focus on skin stretch and not slide.  Pt performs this well upon leaving and feels like she will do well ind for now.                     PT Education - 02/26/21 1106     Education Details self MLD    Person(s) Educated Patient    Methods Explanation;Demonstration;Tactile cues;Verbal cues;Handout    Comprehension Verbalized understanding;Returned demonstration;Verbal cues required;Tactile cues required              PT Short Term Goals - 02/26/21  1107       PT SHORT TERM GOAL #1   Title Pt will be ind with self MLD for the Rt UE  Time 2    Period Weeks    Status New               PT Long Term Goals - 05/29/20 0957       PT LONG TERM GOAL #1   Title Patient will demonstrate she has regained full shoulder ROM and function post op compared to baselines.    Status Achieved      PT LONG TERM GOAL #2   Title Patient will increase left shoulder active flexion to >/= 145 degrees for increased ease reaching overhead.    Status Achieved      PT LONG TERM GOAL #3   Title Patient will increase left shoulder active abduction to >/= 145 degrees for increased ease obtaining radiation positioning.    Status Achieved      PT LONG TERM GOAL #4   Title Patient will report she feels a >/= 50% improvement in her left breast swelling.    Status Achieved      PT LONG TERM GOAL #5   Title Patient will improve her DASH score to be zero (0) for improved overall arm function back to baseline.    Status Achieved      PT LONG TERM GOAL #6   Title Pt will be ind with strength ABC for the left UE    Status Achieved                    Plan - 02/26/21 1103     Clinical Impression Statement Pt returns after continuing to score higher on her SOZO scans.  She has yet to be in the red and remains in subclinical status.  She has no pitting or visible swelling.  No circumferential differences between sides.  Pt was upgraded to a medi harmony sleeve vs her current comfort nad was educated on donning again as she was not pulling it up very high.  Also reminded pt of the importance of activity overall.  Educated pt on self MLD which she performed very well with cueing and instruction.  Decided to have pt check in in 10 days for repeat SOZO and to see if that new sleeve is working well and to check MLD.    Stability/Clinical Decision Making Evolving/Moderate complexity    Clinical Decision Making Moderate    Rehab Potential Excellent     PT Frequency Biweekly   will continue with SOZO scans monthly and then every 3 months per original POC.   PT Duration 4 weeks    PT Treatment/Interventions ADLs/Self Care Home Management;Therapeutic exercise;Scar mobilization;Passive range of motion;Patient/family education;Manual techniques    PT Next Visit Plan How was new sleeve? need class 2 or different? repeat SOZO, review MLD    Consulted and Agree with Plan of Care Patient             Patient will benefit from skilled therapeutic intervention in order to improve the following deficits and impairments:  Postural dysfunction, Decreased range of motion, Decreased knowledge of precautions, Impaired UE functional use, Pain, Increased edema, Decreased scar mobility  Visit Diagnosis: Aftercare following surgery for neoplasm  Abnormal posture  Malignant neoplasm of upper-outer quadrant of left breast in female, estrogen receptor negative (HCC)  Localized edema  Stiffness of left shoulder, not elsewhere classified     Problem List Patient Active Problem List   Diagnosis Date Noted   Ductal carcinoma in situ (DCIS) of right breast 05/06/2020   Genetic testing 11/04/2019   Port-A-Cath  in place 10/22/2019   Malignant neoplasm of upper-outer quadrant of left breast in female, estrogen receptor negative ([place]) 10/03/2019   Unspecified arthropathy, ankle and foot 09/24/2009   HALLUX RIGIDUS 09/24/2009    Stark Bray, PT 02/26/2021, 11:09 AM  [facility] @ [place], [place], [address] Phone: [phone]   Fax:  [phone]  Name: Cheyenne Mcdonald MRN: [id number] Date of Birth: [birth year]

## 2021-02-26 NOTE — Patient Instructions (Signed)
Deep Effective Breath   1.) Standing, sitting, or laying down, place both hands on the belly. Take a deep breath IN, expanding the belly; then breath OUT, contracting the belly. Repeat __5__ times.    Axilla to Axilla - Sweep   2.) On both sides make 5 circles in the armpit,   3.) then pump/stretch  _5__ times from involved left across chest to right  armpit, making a pathway. ([address]1)    Copyright  VHI. All rights reserved.  Axilla to Inguinal Nodes - Sweep   4.) On left , make 5 circles at groin at panty line,   5.) then pump _5__ times from left armpit along side of trunk to left outer hip, making your other pathway. ([address] #2)   Arm Posterior: Elbow to Shoulder - Sweep   6.) Pump _5__ times from back of elbow to top of shoulder.        *No squeeze*  7.) Then inner to outer upper arm _5_ times, then outer arm again _5_ times. Then back to the pathways _2-3_ times.    ARM: [place] Wrist to Elbow - Sweep   8.) Pump or stationary circles _5__ times from wrist to elbow making sure to do both sides of the forearm.   9.) Then retrace your steps to the outer arm, and the pathways _2-3_ times each. .  ARM: Dorsum of Hand to Shoulder - Sweep   Pump or stationary circles _5__ times on back of hand including knuckle spaces and individual fingers if needed working up towards the wrist, then retrace all your steps working back up the forearm, doing both sides; upper outer arm and back to your pathways _2-3_ times each. Then do 5 circles again at uninvolved armpit and involved groin where you started! Good job!! Do __1_ time per day.  Copyright  VHI. All rights reserved.

## 2021-03-08 ENCOUNTER — Other Ambulatory Visit: Payer: Self-pay

## 2021-03-08 ENCOUNTER — Encounter: Payer: Self-pay | Admitting: Physical Therapy

## 2021-03-08 ENCOUNTER — Ambulatory Visit: Payer: Medicare HMO | Admitting: Physical Therapy

## 2021-03-08 DIAGNOSIS — Z483 Aftercare following surgery for neoplasm: Secondary | ICD-10-CM

## 2021-03-08 DIAGNOSIS — R293 Abnormal posture: Secondary | ICD-10-CM

## 2021-03-08 DIAGNOSIS — M25612 Stiffness of left shoulder, not elsewhere classified: Secondary | ICD-10-CM

## 2021-03-08 DIAGNOSIS — I890 Lymphedema, not elsewhere classified: Secondary | ICD-10-CM | POA: Diagnosis not present
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
## 2021-03-19 ENCOUNTER — Other Ambulatory Visit: Payer: Self-pay

## 2021-03-19 ENCOUNTER — Ambulatory Visit: Payer: Medicare HMO | Attending: Hematology and Oncology | Admitting: Rehabilitation

## 2021-03-19 DIAGNOSIS — Z483 Aftercare following surgery for neoplasm: Secondary | ICD-10-CM | POA: Diagnosis not present

## 2021-03-19 DIAGNOSIS — R293 Abnormal posture: Secondary | ICD-10-CM | POA: Diagnosis not present

## 2021-03-19 DIAGNOSIS — M25612 Stiffness of left shoulder, not elsewhere classified: Secondary | ICD-10-CM | POA: Diagnosis not present

## 2021-03-19 DIAGNOSIS — C50412 Malignant neoplasm of upper-outer quadrant of left female breast: Secondary | ICD-10-CM | POA: Diagnosis not present

## 2021-03-19 DIAGNOSIS — Z171 Estrogen receptor negative status [ER-]: Secondary | ICD-10-CM | POA: Diagnosis not present

## 2021-03-19 DIAGNOSIS — I890 Lymphedema, not elsewhere classified: Secondary | ICD-10-CM | POA: Diagnosis not present

## 2021-03-21 ENCOUNTER — Encounter: Payer: Self-pay | Admitting: Rehabilitation

## 2021-03-21 NOTE — Therapy (Signed)
[place] ?[place] @ [place] ?[place], [place], [address] ?Phone: [phone]   Fax:  [phone] ? ?Physical Therapy Treatment ? ?Patient Details  ?Name: Cheyenne Mcdonald ?MRN: [id number] ?Date of Birth: [birth year] ?Referring Provider (PT): Dr. Chryl Heck ? ? ?Encounter Date: 03/19/2021 ? ? PT End of Session - 03/21/21 2206   ? ? Visit Number 8   ? Number of Visits 10   ? Date for PT Re-Evaluation 04/09/21   ? PT Start Time 1005   ? PT Stop Time 1055   ? PT Time Calculation (min) 50 min   ? Activity Tolerance Patient tolerated treatment well   ? Behavior During Therapy [facility] for tasks assessed/performed   ? ?  ?  ? ?  ? ? ?Past Medical History:  ?Diagnosis Date  ? Arthritis   ? gout big toe  ? Breast cancer ([place])   ? left breast IDC, right breast DCIS  ? History of radiation therapy 07/20/2020  ? bilateral breasts 06/01/2020-07/20/2020  Dr Gery Pray  ? Obesity   ? Personal history of chemotherapy   ? 2022 bilat lumpectomies w/rad w/chemo  ? Personal history of radiation therapy   ? 2022 bilat lumpectomies w/rad w/chemo  ? Sleep apnea   ? does not use CPAP  ? ? ?Past Surgical History:  ?Procedure Laterality Date  ? ABDOMINAL HYSTERECTOMY    ? BREAST LUMPECTOMY Bilateral   ? 2022 bilat lumpectomies w/rad w/chemo  ? BREAST LUMPECTOMY WITH RADIOACTIVE SEED AND SENTINEL LYMPH NODE BIOPSY Left 04/16/2020  ? Procedure: LEFT BREAST LUMPECTOMY WITH RADIOACTIVE SEED AND LEFT AXILLARY SENTINEL LYMPH NODE BIOPSY;  Surgeon: [name], MD;  Location: [place];  Service: General;  Laterality: Left;  ? BREAST LUMPECTOMY WITH RADIOACTIVE SEED LOCALIZATION Right 04/16/2020  ? Procedure: RIGHT BREAST LUMPECTOMY WITH RADIOACTIVE SEED LOCALIZATION;  Surgeon: [name], MD;  Location: [place];  Service: General;  Laterality: Right;  ? IR IMAGING GUIDED PORT INSERTION  10/18/2019  ? PORT-A-CATH REMOVAL Right 09/02/2020  ? Procedure: REMOVAL  PORT-A-CATH;  Surgeon: [name], MD;  Location: [place];  Service: General;  Laterality: Right;  ? ? ?There were no vitals filed for this visit. ? ? Subjective Assessment - 03/21/21 2210   ? ? Subjective Pt has still been wearing her sleeve every day and also had a good trip to [place] and back flying without any increased edema.  LDEX is up only0.2  to exactly 6.5 above baseline.  At this time pt has a class 1 medi harmony and is learing self MLD.  We decided to continue visits for in clinic MLD and continued education of MLD until next SOZO visit in another 4 weeks.  At that time we will repeat SOZO and either keep the garments the same, or move up to a class 2 harmony vs flat knit class 1.   ? 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 It measures 9 mm and 1.4 cm both located in the upper outer quadrant. Ki67 is 90%; Lt lumpectomy and SLNB and Rt DCIS lumpectomy 04/16/2020. Completed radiation   ? Currently in Pain? No/denies   ? ?  ?  ? ?  ? ? ? ? ? ? ? ? ? ? ? ? ? ? ? ? ? ? ? ? [place] [facility] Adult PT Treatment/Exercise - 03/21/21 0001   ? ?  ?  Manual Therapy  ? Manual therapy comments performed SOZO   ? Manual Lymphatic Drainage (MLD) in supine, short neck, superficial and deep abdominal work. bilateral axillary circles, Lt inguinal circles and axillo- inguinal anastamosis. left UE from shoulder to hand with return along pathways; reviewing steps with pt and lymphatic anatomy.   ? ?  ?  ? ?  ? ? ? ? ? ? ? ? ? ? ? ? PT Short Term Goals - 03/08/21 1114   ? ?  ? PT SHORT TERM GOAL #1  ? Title Pt will be ind with self MLD for the Rt UE   ? Time 2   ? Period Weeks   ? Status On-going   ? ?  ?  ? ?  ? ? ? ? ? ? ? ? ? ? ? Plan - 03/21/21 2217   ? ? Clinical Impression Statement Pt has slight increase of only 0.2 after air travel which is encouraging.  Plan will be to continue with in clinic MLD  and education on MLD forr the Lt UE until next SOZO repeat.  See treatment note for plan discussed with pt.   ? PT Frequency 1x / week   ? PT Duration 4 weeks   ? PT Treatment/Interventions ADLs/Self Care Home Management;Therapeutic exercise;Scar mobilization;Passive range of motion;Patient/family education;Manual techniques   ? PT Next Visit Plan *Make sure [employer] gets approved and updated* Continue MLD and education on MLD for the Lt UE, add some remedial exercise type strengthening - repeat SOZO 3/29 visit   ? Consulted and Agree with Plan of Care Patient   ? ?  ?  ? ?  ? ? ?Patient will benefit from skilled therapeutic intervention in order to improve the following deficits and impairments:    ? ?Visit Diagnosis: ?Lymphedema, not elsewhere classified ? ?Aftercare following surgery for neoplasm ? ? ? ? ?Problem List ?Patient Active Problem List  ? Diagnosis Date Noted  ? 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 Genetic testing 11/04/2019  ? Port-A-Cath in place 10/22/2019  ? Malignant neoplasm of upper-outer quadrant of left breast in female, estrogen receptor negative ([place]) 10/03/2019  ? Unspecified arthropathy, ankle and foot 09/24/2009  ? HALLUX RIGIDUS 09/24/2009  ? ? ?Stark Bray, PT ?03/21/2021, 10:22 PM ? ?[place] ?[place] @ [place] ?[place], [place], [address] ?Phone: [phone]   Fax:  [phone] ? ?Name: Cheyenne Mcdonald ?MRN: [id number] ?Date of Birth: [birth year] ? ? ? ?

## 2021-03-24 ENCOUNTER — Ambulatory Visit: Payer: Medicare HMO

## 2021-03-24 ENCOUNTER — Other Ambulatory Visit: Payer: Self-pay

## 2021-03-24 DIAGNOSIS — Z171 Estrogen receptor negative status [ER-]: Secondary | ICD-10-CM | POA: Diagnosis not present

## 2021-03-24 DIAGNOSIS — R293 Abnormal posture: Secondary | ICD-10-CM

## 2021-03-24 DIAGNOSIS — C50412 Malignant neoplasm of upper-outer quadrant of left female breast: Secondary | ICD-10-CM

## 2021-03-24 DIAGNOSIS — M25612 Stiffness of left shoulder, not elsewhere classified: Secondary | ICD-10-CM | POA: Diagnosis not present

## 2021-03-24 DIAGNOSIS — I890 Lymphedema, not elsewhere classified: Secondary | ICD-10-CM

## 2021-03-24 DIAGNOSIS — Z483 Aftercare following surgery for neoplasm: Secondary | ICD-10-CM

## 2021-03-24 NOTE — Therapy (Signed)
[place] ?[place] @ [place] ?[place], [place], [address] ?Phone: [phone]   Fax:  [phone] ? ?Physical Therapy Treatment ? ?Patient Details  ?Name: Cheyenne Mcdonald ?MRN: [id number] ?Date of Birth: [birth year] ?Referring Provider (PT): Dr. Chryl Heck ? ? ?Encounter Date: 03/24/2021 ? ? PT End of Session - 03/24/21 0815   ? ? Visit Number 9   ? Number of Visits 10   ? Date for PT Re-Evaluation 04/09/21   ? PT Start Time [phone]   pt arrived late  ? PT Stop Time 0907   ? PT Time Calculation (min) 57 min   ? Activity Tolerance Patient tolerated treatment well   ? Behavior During Therapy [facility] for tasks assessed/performed   ? ?  ?  ? ?  ? ? ?Past Medical History:  ?Diagnosis Date  ? Arthritis   ? gout big toe  ? Breast cancer ([place])   ? left breast IDC, right breast DCIS  ? History of radiation therapy 07/20/2020  ? bilateral breasts 06/01/2020-07/20/2020  Dr Gery Pray  ? Obesity   ? Personal history of chemotherapy   ? 2022 bilat lumpectomies w/rad w/chemo  ? Personal history of radiation therapy   ? 2022 bilat lumpectomies w/rad w/chemo  ? Sleep apnea   ? does not use CPAP  ? ? ?Past Surgical History:  ?Procedure Laterality Date  ? ABDOMINAL HYSTERECTOMY    ? BREAST LUMPECTOMY Bilateral   ? 2022 bilat lumpectomies w/rad w/chemo  ? BREAST LUMPECTOMY WITH RADIOACTIVE SEED AND SENTINEL LYMPH NODE BIOPSY Left 04/16/2020  ? Procedure: LEFT BREAST LUMPECTOMY WITH RADIOACTIVE SEED AND LEFT AXILLARY SENTINEL LYMPH NODE BIOPSY;  Surgeon: [name], MD;  Location: [place];  Service: General;  Laterality: Left;  ? BREAST LUMPECTOMY WITH RADIOACTIVE SEED LOCALIZATION Right 04/16/2020  ? Procedure: RIGHT BREAST LUMPECTOMY WITH RADIOACTIVE SEED LOCALIZATION;  Surgeon: [name], MD;  Location: [place];  Service: General;  Laterality: Right;  ? IR IMAGING GUIDED PORT INSERTION  10/18/2019  ? PORT-A-CATH REMOVAL Right 09/02/2020  ?  Procedure: REMOVAL PORT-A-CATH;  Surgeon: [name], MD;  Location: [place];  Service: General;  Laterality: Right;  ? ? ?There were no vitals filed for this visit. ? ? Subjective Assessment - 03/24/21 0813   ? ? Subjective I can tell my hand is always a little puffy when I wake up in the morning but the compression gets in right back down.   ? 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 It measures 9 mm and 1.4 cm both located in the upper outer quadrant. Ki67 is 90%; Lt lumpectomy and SLNB and Rt DCIS lumpectomy 04/16/2020. Completed radiation   ? Patient Stated Goals anything for the swelling   ? Currently in Pain? No/denies   ? ?  ?  ? ?  ? ? ? ? ? ? ? ? ? ? ? ? ? ? ? ? ? ? ? ? [facility] Adult PT Treatment/Exercise - 03/24/21 0001   ? ?  ? Manual Therapy  ? Manual Lymphatic Drainage (MLD) in supine, short neck, superficial and deep abdominal work. bilateral axillary circles, Lt inguinal circles and axillo- inguinal anastamosis. left UE from shoulder to hand with return along pathways   ? ?  ?  ? ?  ? ? ? ? ? ? ? ? ? ? ? ?  PT Short Term Goals - 03/08/21 1114   ? ?  ? PT SHORT TERM GOAL #1  ? Title Pt will be ind with self MLD for the Rt UE   ? Time 2   ? Period Weeks   ? Status On-going   ? ?  ?  ? ?  ? ? ? ? PT Long Term Goals - 05/29/20 0957   ? ?  ? PT LONG TERM GOAL #1  ? Title Patient will demonstrate she has regained full shoulder ROM and function post op compared to baselines.   ? Status Achieved   ?  ? PT LONG TERM GOAL #2  ? Title Patient will increase left shoulder active flexion to >/= 145 degrees for increased ease reaching overhead.   ? Status Achieved   ?  ? PT LONG TERM GOAL #3  ? Title Patient will increase left shoulder active abduction to >/= 145 degrees for increased ease obtaining radiation positioning.   ? Status Achieved   ?  ? PT LONG TERM GOAL #4  ? Title  Patient will report she feels a >/= 50% improvement in her left breast swelling.   ? Status Achieved   ?  ? PT LONG TERM GOAL #5  ? Title Patient will improve her DASH score to be zero (0) for improved overall arm function back to baseline.   ? Status Achieved   ?  ? PT LONG TERM GOAL #6  ? Title Pt will be ind with strength ABC for the left UE   ? Status Achieved   ? ?  ?  ? ?  ? ? ? ? ? ? ? ? Plan - 03/24/21 0907   ? ? Clinical Impression Statement Continued with MLd to Lt UE reviewing with pt througout importance of light skin stretch and pressure. Answered pts questions during session about safely progressing resistance with exs and also answered her more in depth anatomy of the lymphatic system questions helping her to better understand her current situation. Pt able to verbalize better understanding of all by end of session.   ? Stability/Clinical Decision Making Evolving/Moderate complexity   ? Rehab Potential Excellent   ? PT Frequency 1x / week   ? PT Duration 4 weeks   ? PT Treatment/Interventions ADLs/Self Care Home Management;Therapeutic exercise;Scar mobilization;Passive range of motion;Patient/family education;Manual techniques   ? PT Next Visit Plan *Make sure [employer] gets approved and updated* Continue MLD and education on MLD for the Lt UE, add some remedial exercise type strengthening - repeat SOZO 3/29 visit   ? Consulted and Agree with Plan of Care Patient   ? ?  ?  ? ?  ? ? ?Patient will benefit from skilled therapeutic intervention in order to improve the following deficits and impairments:  Postural dysfunction, Decreased range of motion, Decreased knowledge of precautions, Impaired UE functional use, Pain, Increased edema, Decreased scar mobility ? ?Visit Diagnosis: ?Lymphedema, not elsewhere classified ? ?Aftercare following surgery for neoplasm ? ?Abnormal posture ? ?Stiffness of left shoulder, not elsewhere classified ? ?Malignant neoplasm of upper-outer quadrant of left breast in female,  estrogen receptor negative ([place]) ? ? ? ? ?Problem List ?Patient Active Problem List  ? Diagnosis Date Noted  ? 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 Genetic testing 11/04/2019  ? Port-A-Cath in place 10/22/2019  ? Malignant neoplasm of upper-outer quadrant of left breast in female, estrogen receptor negative ([place]) 10/03/2019  ? Unspecified arthropathy, ankle and  foot 09/24/2009  ? HALLUX RIGIDUS 09/24/2009  ? ? ?Otelia Limes, PTA ?03/24/2021, 9:09 AM ? ?[place] ?[place] @ [facility] ?[place], [place], [address] ?Phone: [phone]   Fax:  [phone] ? ?Name: Cheyenne Mcdonald ?MRN: [id number] ?Date of Birth: 01-Jan-1951 ? ? ? ?

## 2021-03-31 ENCOUNTER — Ambulatory Visit: Payer: Medicare HMO

## 2021-03-31 ENCOUNTER — Other Ambulatory Visit: Payer: Self-pay

## 2021-03-31 DIAGNOSIS — M25612 Stiffness of left shoulder, not elsewhere classified: Secondary | ICD-10-CM | POA: Diagnosis not present

## 2021-03-31 DIAGNOSIS — C50412 Malignant neoplasm of upper-outer quadrant of left female breast: Secondary | ICD-10-CM

## 2021-03-31 DIAGNOSIS — R293 Abnormal posture: Secondary | ICD-10-CM

## 2021-03-31 DIAGNOSIS — Z483 Aftercare following surgery for neoplasm: Secondary | ICD-10-CM

## 2021-03-31 DIAGNOSIS — I890 Lymphedema, not elsewhere classified: Secondary | ICD-10-CM | POA: Diagnosis not present

## 2021-03-31 DIAGNOSIS — Z171 Estrogen receptor negative status [ER-]: Secondary | ICD-10-CM | POA: Diagnosis not present

## 2021-03-31 NOTE — Therapy (Addendum)
[place] ?[facility] [place] [place] ?[place], [place], [address] ?Phone: [phone]   Fax:  [phone] ? ? ?Physical Therapy Progress Note ? ?Dates of Reporting Period: 07/20/20 to 03/31/21 ? ?Objective Reports of Subjective Statement: see below ? ?Objective Measurements: see below ? ?Goal Update: see below ? ?Plan: Will continue SOZO screenings and lymphedema management ? ?Reason Skilled Services are Required: lymphedema prevention ? ?Physical Therapy Treatment ? ?Patient Details  ?Name: Cheyenne Mcdonald ?MRN: [id number] ?Date of Birth: 1950/09/27 ?Referring Provider (PT): Dr. Chryl Heck ? ? ?Encounter Date: 03/31/2021 ? ? PT End of Session - 03/31/21 0914   ? ? Visit Number 10   ? Number of Visits 10   ? Date for PT Re-Evaluation 04/09/21   ? Authorization Type [employer] - 5 visits from 3/10-04/14/21   ? Authorization - Visit Number 2   ? Authorization - Number of Visits 5   ? Progress Note Due on Visit 20   ? PT Start Time 0908   pt arrived late  ? PT Stop Time 1003   ? PT Time Calculation (min) 55 min   ? Activity Tolerance Patient tolerated treatment well   ? Behavior During Therapy [facility] for tasks assessed/performed   ? ?  ?  ? ?  ? ? ?Past Medical History:  ?Diagnosis Date  ? Arthritis   ? gout big toe  ? Breast cancer ([place])   ? left breast IDC, right breast DCIS  ? History of radiation therapy 07/20/2020  ? bilateral breasts 06/01/2020-07/20/2020  Dr Gery Pray  ? Obesity   ? Personal history of chemotherapy   ? 2022 bilat lumpectomies w/rad w/chemo  ? Personal history of radiation therapy   ? 2022 bilat lumpectomies w/rad w/chemo  ? Sleep apnea   ? does not use CPAP  ? ? ?Past Surgical History:  ?Procedure Laterality Date  ? ABDOMINAL HYSTERECTOMY    ? BREAST LUMPECTOMY Bilateral   ? 2022 bilat lumpectomies w/rad w/chemo  ? BREAST LUMPECTOMY WITH RADIOACTIVE SEED AND SENTINEL LYMPH NODE BIOPSY Left 04/16/2020  ? Procedure: LEFT BREAST LUMPECTOMY WITH RADIOACTIVE SEED AND LEFT  AXILLARY SENTINEL LYMPH NODE BIOPSY;  Surgeon: [name], MD;  Location: [place];  Service: General;  Laterality: Left;  ? BREAST LUMPECTOMY WITH RADIOACTIVE SEED LOCALIZATION Right 04/16/2020  ? Procedure: RIGHT BREAST LUMPECTOMY WITH RADIOACTIVE SEED LOCALIZATION;  Surgeon: [name], MD;  Location: [place];  Service: General;  Laterality: Right;  ? IR IMAGING GUIDED PORT INSERTION  10/18/2019  ? PORT-A-CATH REMOVAL Right 09/02/2020  ? Procedure: REMOVAL PORT-A-CATH;  Surgeon: [name], MD;  Location: [place];  Service: General;  Laterality: Right;  ? ? ?There were no vitals filed for this visit. ? ? Subjective Assessment - 03/31/21 0913   ? ? Subjective I've done the self MLD twice since I was here last. I've also been wearing my compression garments daily but still need to get back on my daily walking routine.   ? 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 It measures 9 mm and 1.4 cm both located in the upper outer quadrant. Ki67 is 90%; Lt lumpectomy and SLNB and Rt DCIS lumpectomy 04/16/2020. Completed radiation   ? Patient Stated Goals anything for the swelling   ? Currently in Pain? No/denies   ? ?  ?  ? ?  ? ? ? ? ? ? ? ? ? ? ? ? ? ? ? ? ? ? ? ?  [place] Adult PT Treatment/Exercise - 03/31/21 0001   ? ?  ? Manual Therapy  ? Manual Lymphatic Drainage (MLD) in supine, short neck, superficial and deep abdominal work. Rt axillary circles, Lt inguinal circles and anterior axillo- inguinal and Lt axillo-inguinal anastamosis, then left UE from shoulder to hand with return along pathways and reviewing wiht pt prn througout while performing   ? ?  ?  ? ?  ? ? ? ? ? ? ? ? ? ? ? ? PT Short Term Goals - 03/31/21 1007   ? ?  ? PT SHORT TERM GOAL #1  ? Title Pt will be ind with self MLD for the Rt UE   ? Baseline Pt is mostly independent  with this at this time benefitting from review at each session - 03/31/21   ? Status On-going   ? ?  ?  ? ?  ? ? ? ? PT Long Term Goals - 03/31/21 1009   ? ?  ? PT LONG TERM GOAL #1  ? Title Pt will maintain SOZO score of <6.5 increase from baseline to demonstrate independent lymphedema management.   ? Time 4   ? Period Weeks   ? Status On-going   ? Target Date 04/09/21   ? ?  ?  ? ?  ? ? ? ? ? ? ? ? Plan - 03/31/21 1530   ? ? Clinical Impression Statement Continued MLD to Lt UE reviewing throughout.  Pt is doing well with MLD and will do 1 more visit next week to check SOZO again and plan treatment accordingly.   ? PT Frequency 1x / week   ? PT Duration 4 weeks   ? PT Treatment/Interventions ADLs/Self Care Home Management;Therapeutic exercise;Scar mobilization;Passive range of motion;Patient/family education;Manual techniques   ? PT Next Visit Plan Continue MLD and education on MLD for the Lt UE, add some remedial exercise type strengthening - repeat SOZO 3/29 visit and renew as needed or reset up 3 month screens.   ? Consulted and Agree with Plan of Care Patient   ? ?  ?  ? ?  ? ? ?Patient will benefit from skilled therapeutic intervention in order to improve the following deficits and impairments:    ? ?Visit Diagnosis: ?Lymphedema, not elsewhere classified ? ?Aftercare following surgery for neoplasm ? ?Abnormal posture ? ?Stiffness of left shoulder, not elsewhere classified ? ?Malignant neoplasm of upper-outer quadrant of left breast in female, estrogen receptor negative ([place]) ? ? ? ? ?Problem List ?Patient Active Problem List  ? Diagnosis Date Noted  ? 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 Genetic testing 11/04/2019  ? Port-A-Cath in place 10/22/2019  ? Malignant neoplasm of upper-outer quadrant of left breast in female, estrogen receptor negative ([place]) 10/03/2019  ? Unspecified arthropathy, ankle and foot 09/24/2009  ? HALLUX RIGIDUS 09/24/2009  ? ? ?Stark Bray, PT ?03/31/2021, 3:35  PM ? ?[place] ?[facility] [place] ?[place], [place], [address] ?Phone: [phone]   Fax:  [phone] ? ?Name: VINISHA FAXON ?MRN: [id number] ?Date of Birth: 1950-10-20 ? ? ? ?

## 2021-04-05 ENCOUNTER — Ambulatory Visit: Payer: Medicare HMO

## 2021-04-05 DIAGNOSIS — C50911 Malignant neoplasm of unspecified site of right female breast: Secondary | ICD-10-CM | POA: Diagnosis not present

## 2021-04-05 DIAGNOSIS — Z170 Estrogen receptor positive status [ER+]: Secondary | ICD-10-CM | POA: Diagnosis not present

## 2021-04-05 DIAGNOSIS — C50912 Malignant neoplasm of unspecified site of left female breast: Secondary | ICD-10-CM | POA: Diagnosis not present

## 2021-04-07 ENCOUNTER — Other Ambulatory Visit: Payer: Self-pay

## 2021-04-07 ENCOUNTER — Ambulatory Visit: Payer: Medicare HMO

## 2021-04-07 VITALS — Wt 213.5 lb

## 2021-04-07 DIAGNOSIS — Z483 Aftercare following surgery for neoplasm: Secondary | ICD-10-CM

## 2021-04-07 DIAGNOSIS — Z171 Estrogen receptor negative status [ER-]: Secondary | ICD-10-CM | POA: Diagnosis not present

## 2021-04-07 DIAGNOSIS — I890 Lymphedema, not elsewhere classified: Secondary | ICD-10-CM

## 2021-04-07 DIAGNOSIS — R293 Abnormal posture: Secondary | ICD-10-CM | POA: Diagnosis not present

## 2021-04-07 DIAGNOSIS — M25612 Stiffness of left shoulder, not elsewhere classified: Secondary | ICD-10-CM | POA: Diagnosis not present

## 2021-04-07 DIAGNOSIS — C50412 Malignant neoplasm of upper-outer quadrant of left female breast: Secondary | ICD-10-CM | POA: Diagnosis not present

## 2021-04-07 NOTE — Therapy (Signed)
[place] ?[place] [facility] [place] ?[place], [place], [address] ?Phone: [phone]   Fax:  [phone] ? ?Physical Therapy Treatment ? ?Patient Details  ?Name: Cheyenne Mcdonald ?MRN: [id number] ?Date of Birth: Jan 10, 1951 ?Referring Provider (PT): Dr. Chryl Heck ? ? ?Encounter Date: 04/07/2021 ? ? PT End of Session - 04/07/21 1710   ? ? Visit Number 11   ? Number of Visits 19   ? Date for PT Re-Evaluation 05/05/21   ? Progress Note Due on Visit 21   ? PT Start Time 0911   ? PT Stop Time 1004   ? PT Time Calculation (min) 53 min   ? Activity Tolerance Patient tolerated treatment well   ? Behavior During Therapy [facility] for tasks assessed/performed   ? ?  ?  ? ?  ? ? ?Past Medical History:  ?Diagnosis Date  ? Arthritis   ? gout big toe  ? Breast cancer ([place])   ? left breast IDC, right breast DCIS  ? History of radiation therapy 07/20/2020  ? bilateral breasts 06/01/2020-07/20/2020  Dr Gery Pray  ? Obesity   ? Personal history of chemotherapy   ? 2022 bilat lumpectomies w/rad w/chemo  ? Personal history of radiation therapy   ? 2022 bilat lumpectomies w/rad w/chemo  ? Sleep apnea   ? does not use CPAP  ? ? ?Past Surgical History:  ?Procedure Laterality Date  ? ABDOMINAL HYSTERECTOMY    ? BREAST LUMPECTOMY Bilateral   ? 2022 bilat lumpectomies w/rad w/chemo  ? BREAST LUMPECTOMY WITH RADIOACTIVE SEED AND SENTINEL LYMPH NODE BIOPSY Left 04/16/2020  ? Procedure: LEFT BREAST LUMPECTOMY WITH RADIOACTIVE SEED AND LEFT AXILLARY SENTINEL LYMPH NODE BIOPSY;  Surgeon: [name], MD;  Location: [place];  Service: General;  Laterality: Left;  ? BREAST LUMPECTOMY WITH RADIOACTIVE SEED LOCALIZATION Right 04/16/2020  ? Procedure: RIGHT BREAST LUMPECTOMY WITH RADIOACTIVE SEED LOCALIZATION;  Surgeon: [name], MD;  Location: [place];  Service: General;  Laterality: Right;  ? IR IMAGING GUIDED PORT INSERTION  10/18/2019  ? PORT-A-CATH REMOVAL Right  09/02/2020  ? Procedure: REMOVAL PORT-A-CATH;  Surgeon: [name], MD;  Location: [place];  Service: General;  Laterality: Right;  ? ? ?Vitals:  ? 04/07/21 0915  ?Weight: 213 lb 8 oz (96.8 kg)  ? ? ? ? ? ? ? ? ? ? ? L-DEX FLOWSHEETS - 04/07/21 0900   ? ?  ? L-DEX LYMPHEDEMA SCREENING  ? Measurement Type Unilateral   ? L-DEX MEASUREMENT EXTREMITY Upper Extremity   ? POSITION  Standing   ? DOMINANT SIDE Left   ? At Risk Side Right   ? BASELINE SCORE (UNILATERAL) 3.3   ? L-DEX SCORE (UNILATERAL) 11.9   ? VALUE CHANGE (UNILAT) 8.6   ? ?  ?  ? ?  ? ? ? ? ? ? ? ? ? ? ? ? [facility] Adult PT Treatment/Exercise - 04/07/21 0001   ? ?  ? Manual Therapy  ? Manual Therapy Compression Bandaging   ? Edema Management Tried measuring pt for an OTS flat knit but pt does not fit in these so she will need to be measured for a custom garment in upcoming appt   ? Compression Bandaging Cocoa butter, TG soft, Elastomull to fingers 1-4, Artiflex x1 from hand to axilla, 1-6, 1-10 and 1-12 cm short stretch compression bandages from hand to axilla beginning to instruct pt while performing   ? ?  ?  ? ?  ? ? ? ? ? ? ? ? ? ? ? ?  PT Short Term Goals - 03/31/21 1007   ? ?  ? PT SHORT TERM GOAL #1  ? Title Pt will be ind with self MLD for the Rt UE   ? Baseline Pt is mostly independent with this at this time benefitting from review at each session - 03/31/21   ? Status On-going   ? ?  ?  ? ?  ? ? ? ? PT Long Term Goals - 04/07/21 1714   ? ?  ? PT LONG TERM GOAL #1  ? Title Pt will maintain SOZO score of <6.5 increase from baseline to demonstrate independent lymphedema management.   ? Baseline Change from baseline today is 8.6 - 04/07/21   ? Status Not Met   ?  ? PT LONG TERM GOAL #2  ? Title pt will be ind with compression bandaging for the UE to manage lymphedema   ? Time 4   ? Period Weeks   ? Status New   ?  ? PT LONG TERM GOAL #3  ? Title Pt will obtain well fitting flat knit garment for lymphedema control   ? Time 4   ?  Period Weeks   ? Status New   ?  ? PT LONG TERM GOAL #4  ? Title NA   ?  ? PT LONG TERM GOAL #5  ? Title NA   ?  ? PT LONG TERM GOAL #6  ? Title nA   ? ?  ?  ? ?  ? ? ? ? ? ? ? ? Plan - 04/07/21 1240   ? ? Clinical Impression Statement Retested SOZO today and pts change from baseline of 8.6 is higher than normal limits. Discussed options with pt at this point and she would like begin compression bandaging and learn how to do this to wear at night and would like to get a flat knit compression sleeve and glove to wear during the day. But she plans to wear the compression bandages 24/7 as she doesn't fit in OTS size so will need to be custom measured but did not have time to do this today. Renewal done today to update POC to add compression bandaging.   ? Stability/Clinical Decision Making Evolving/Moderate complexity   ? Rehab Potential Excellent   ? PT Frequency 2x / week   ? PT Duration 4 weeks   ? PT Treatment/Interventions ADLs/Self Care Home Management;Therapeutic exercise;Scar mobilization;Passive range of motion;Patient/family education;Manual techniques   ? PT Next Visit Plan Renewal done today; Cont compression bandaging and instruct pt in same; measure for custom compression sleeve and glove flat knit   ? Consulted and Agree with Plan of Care Patient   ? ?  ?  ? ?  ? ? ?Patient will benefit from skilled therapeutic intervention in order to improve the following deficits and impairments:  Postural dysfunction, Decreased range of motion, Decreased knowledge of precautions, Impaired UE functional use, Pain, Increased edema, Decreased scar mobility ? ?Visit Diagnosis: ?Lymphedema, not elsewhere classified ? ?Aftercare following surgery for neoplasm ? ?Abnormal posture ? ?Stiffness of left shoulder, not elsewhere classified ? ? ? ? ?Problem List ?Patient Active Problem List  ? Diagnosis Date Noted  ? 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 Genetic testing 11/04/2019  ? Port-A-Cath in place  10/22/2019  ? Malignant neoplasm of upper-outer quadrant of left breast in female, estrogen receptor negative ([place]) 10/03/2019  ? Unspecified arthropathy, ankle and foot 09/24/2009  ? HALLUX RIGIDUS 09/24/2009  ? ? ?  Stark Bray, PT ?04/07/2021, 5:15 PM ? ?[place] ?[place] [facility] [place] ?[place], [place], [address] ?Phone: [phone]   Fax:  [phone] ? ?Name: Cheyenne Mcdonald ?MRN: [id number] ?Date of Birth: 1950/08/05 ? ? ? ?

## 2021-04-08 ENCOUNTER — Ambulatory Visit: Payer: Medicare HMO

## 2021-04-08 DIAGNOSIS — M25612 Stiffness of left shoulder, not elsewhere classified: Secondary | ICD-10-CM

## 2021-04-08 DIAGNOSIS — I890 Lymphedema, not elsewhere classified: Secondary | ICD-10-CM

## 2021-04-08 DIAGNOSIS — R293 Abnormal posture: Secondary | ICD-10-CM | POA: Diagnosis not present

## 2021-04-08 DIAGNOSIS — Z483 Aftercare following surgery for neoplasm: Secondary | ICD-10-CM

## 2021-04-08 DIAGNOSIS — C50412 Malignant neoplasm of upper-outer quadrant of left female breast: Secondary | ICD-10-CM | POA: Diagnosis not present

## 2021-04-08 DIAGNOSIS — Z171 Estrogen receptor negative status [ER-]: Secondary | ICD-10-CM | POA: Diagnosis not present

## 2021-04-08 NOTE — Therapy (Signed)
[place] ?[place] [facility] [place] ?[place], [place], [address] ?Phone: [phone]   Fax:  [phone] ? ?Physical Therapy Treatment ? ?Patient Details  ?Name: Cheyenne Mcdonald ?MRN: [id number] ?Date of Birth: 15-Feb-1950 ?Referring Provider (PT): Dr. Chryl Heck ? ? ?Encounter Date: 04/08/2021 ? ? PT End of Session - 04/08/21 1314   ? ? Visit Number 12   ? Number of Visits 19   ? Date for PT Re-Evaluation 05/05/21   ? Authorization Type [employer] - 5 visits from 3/10-04/14/21   ? Authorization - Visit Number 3   ? Authorization - Number of Visits 5   ? PT Start Time 1208   ? PT Stop Time 1310   ? PT Time Calculation (min) 62 min   ? Activity Tolerance Patient tolerated treatment well   ? Behavior During Therapy [facility] for tasks assessed/performed   ? ?  ?  ? ?  ? ? ?Past Medical History:  ?Diagnosis Date  ? Arthritis   ? gout big toe  ? Breast cancer ([place])   ? left breast IDC, right breast DCIS  ? History of radiation therapy 07/20/2020  ? bilateral breasts 06/01/2020-07/20/2020  Dr Gery Pray  ? Obesity   ? Personal history of chemotherapy   ? 2022 bilat lumpectomies w/rad w/chemo  ? Personal history of radiation therapy   ? 2022 bilat lumpectomies w/rad w/chemo  ? Sleep apnea   ? does not use CPAP  ? ? ?Past Surgical History:  ?Procedure Laterality Date  ? ABDOMINAL HYSTERECTOMY    ? BREAST LUMPECTOMY Bilateral   ? 2022 bilat lumpectomies w/rad w/chemo  ? BREAST LUMPECTOMY WITH RADIOACTIVE SEED AND SENTINEL LYMPH NODE BIOPSY Left 04/16/2020  ? Procedure: LEFT BREAST LUMPECTOMY WITH RADIOACTIVE SEED AND LEFT AXILLARY SENTINEL LYMPH NODE BIOPSY;  Surgeon: [name], MD;  Location: [place];  Service: General;  Laterality: Left;  ? BREAST LUMPECTOMY WITH RADIOACTIVE SEED LOCALIZATION Right 04/16/2020  ? Procedure: RIGHT BREAST LUMPECTOMY WITH RADIOACTIVE SEED LOCALIZATION;  Surgeon: [name], MD;  Location: [place];  Service: General;   Laterality: Right;  ? IR IMAGING GUIDED PORT INSERTION  10/18/2019  ? PORT-A-CATH REMOVAL Right 09/02/2020  ? Procedure: REMOVAL PORT-A-CATH;  Surgeon: [name], MD;  Location: [place];  Service: General;  Laterality: Right;  ? ? ?There were no vitals filed for this visit. ? ? Subjective Assessment - 04/08/21 1316   ? ? Subjective I left the bandages on until right before I showered to come here. Sleeping in them went great, I had no problems.   ? 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 It measures 9 mm and 1.4 cm both located in the upper outer quadrant. Ki67 is 90%; Lt lumpectomy and SLNB and Rt DCIS lumpectomy 04/16/2020. Completed radiation   ? Patient Stated Goals anything for the swelling   ? Currently in Pain? No/denies   ? ?  ?  ? ?  ? ? ? ? ? ? ? ? ? ? ? ? ? ? ? ? ? ? ? ? [facility] Adult PT Treatment/Exercise - 04/08/21 0001   ? ?  ? Manual Therapy  ? Edema Management MEasured pt for compression sleeve and glove, she is getting a Juzo Dynamic size III max   ? Compression Bandaging Pt performed all with therapist cuing and instruction throughout: Cocoa butter,  TG soft, Elastomull to fingers 1-4, Artiflex x1 from hand to axilla, 1-6, 1-10 and 1-12 cm short stretch compression bandages from hand to axilla   ? ?  ?  ? ?  ? ? ? ? ? ? ? ? ? ? ? ? PT Short Term Goals - 03/31/21 1007   ? ?  ? PT SHORT TERM GOAL #1  ? Title Pt will be ind with self MLD for the Rt UE   ? Baseline Pt is mostly independent with this at this time benefitting from review at each session - 03/31/21   ? Status On-going   ? ?  ?  ? ?  ? ? ? ? PT Long Term Goals - 04/07/21 1714   ? ?  ? PT LONG TERM GOAL #1  ? Title Pt will maintain SOZO score of <6.5 increase from baseline to demonstrate independent lymphedema management.   ? Baseline Change from baseline today is 8.6 - 04/07/21   ? Status Not Met   ?  ? PT  LONG TERM GOAL #2  ? Title pt will be ind with compression bandaging for the UE to manage lymphedema   ? Time 4   ? Period Weeks   ? Status New   ?  ? PT LONG TERM GOAL #3  ? Title Pt will obtain well fitting flat knit garment for lymphedema control   ? Time 4   ? Period Weeks   ? Status New   ?  ? PT LONG TERM GOAL #4  ? Title NA   ?  ? PT LONG TERM GOAL #5  ? Title NA   ?  ? PT LONG TERM GOAL #6  ? Title nA   ? ?  ?  ? ?  ? ? ? ? ? ? ? ? Plan - 04/08/21 1317   ? ? Clinical Impression Statement Pt comes in with bandages having removed them about 1.5 hrs before treatment to shower. She tolerated them very well. Spent beginning of session measuring pt for new compression sleeve and glove. Helped her order Juzo Dynamic sleeve and glove, size III max. Then spent rest of session instructing pt in self bandaging having her perform with therpiast provising cuing and instruction throughout She plans to wear the compression abndages as much as possible until new garments arrive. Then will transition to wearing new garments during day and bandages at night. Then remeasure SOZO in 1 month.   ? Stability/Clinical Decision Making Evolving/Moderate complexity   ? Rehab Potential Excellent   ? PT Frequency 2x / week   ? PT Duration 4 weeks   ? PT Treatment/Interventions ADLs/Self Care Home Management;Therapeutic exercise;Scar mobilization;Passive range of motion;Patient/family education;Manual techniques   ? PT Next Visit Plan Cont and review compression bandaging; cont MLD as time allows. Assess fit of compression garments if she brings them   ? PT Home Exercise Plan Self MLD and compression bandaging   ? Consulted and Agree with Plan of Care Patient   ? ?  ?  ? ?  ? ? ?Patient will benefit from skilled therapeutic intervention in order to improve the following deficits and impairments:  Postural dysfunction, Decreased range of motion, Decreased knowledge of precautions, Impaired UE functional use, Pain, Increased edema,  Decreased scar mobility ? ?Visit Diagnosis: ?Lymphedema, not elsewhere classified ? ?Aftercare following surgery for neoplasm ? ?Abnormal posture ? ?Stiffness of left shoulder, not elsewhere classified ? ? ? ? ?Problem List ?Patient Active Problem List  ?  Diagnosis Date Noted  ? 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 Genetic testing 11/04/2019  ? Port-A-Cath in place 10/22/2019  ? Malignant neoplasm of upper-outer quadrant of left breast in female, estrogen receptor negative ([place]) 10/03/2019  ? Unspecified arthropathy, ankle and foot 09/24/2009  ? HALLUX RIGIDUS 09/24/2009  ? ? ?Otelia Limes, PTA ?04/08/2021, 1:22 PM ? ?[place] ?[facility] [facility] [place] ?[place], [place], [address] ?Phone: [phone]   Fax:  [phone] ? ?Name: KEAIRRA BARDON ?MRN: [id number] ?Date of Birth: 1950-07-24 ? ? ? ?

## 2021-04-13 ENCOUNTER — Ambulatory Visit: Payer: Medicare HMO | Attending: Hematology and Oncology

## 2021-04-13 DIAGNOSIS — M25612 Stiffness of left shoulder, not elsewhere classified: Secondary | ICD-10-CM | POA: Diagnosis not present

## 2021-04-13 DIAGNOSIS — R293 Abnormal posture: Secondary | ICD-10-CM | POA: Diagnosis not present

## 2021-04-13 DIAGNOSIS — I890 Lymphedema, not elsewhere classified: Secondary | ICD-10-CM | POA: Diagnosis not present

## 2021-04-13 DIAGNOSIS — Z483 Aftercare following surgery for neoplasm: Secondary | ICD-10-CM | POA: Diagnosis not present

## 2021-04-13 NOTE — Therapy (Addendum)
[place] @ [facility], [place], [address] Phone: [phone]   Fax:  [phone]  Physical Therapy Treatment  Patient Details  Name: Cheyenne Mcdonald MRN: [id number] Date of Birth: 04-20-1950 Referring Provider (PT): Dr. Chryl Heck   Encounter Date: 04/13/2021   PT End of Session - 04/13/21 0903     Visit Number 13    Number of Visits 19    Date for PT Re-Evaluation 05/05/21    Authorization Type [employer] - 5 visits from 3/10-04/14/21    Authorization - Visit Number 4    Authorization - Number of Visits 5    PT Start Time 4888   pt arrived late   PT Stop Time 0903    PT Time Calculation (min) 53 min    Activity Tolerance Patient tolerated treatment well    Behavior During Therapy [facility] for tasks assessed/performed             Past Medical History:  Diagnosis Date   Arthritis    gout big toe   Breast cancer ([place])    left breast IDC, right breast DCIS   History of radiation therapy 07/20/2020   bilateral breasts 06/01/2020-07/20/2020  Dr Gery Pray   Obesity    Personal history of chemotherapy    2022 bilat lumpectomies w/rad w/chemo   Personal history of radiation therapy    2022 bilat lumpectomies w/rad w/chemo   Sleep apnea    does not use CPAP    Past Surgical History:  Procedure Laterality Date   ABDOMINAL HYSTERECTOMY     BREAST LUMPECTOMY Bilateral    2022 bilat lumpectomies w/rad w/chemo   BREAST LUMPECTOMY WITH RADIOACTIVE SEED AND SENTINEL LYMPH NODE BIOPSY Left 04/16/2020   Procedure: LEFT BREAST LUMPECTOMY WITH RADIOACTIVE SEED AND LEFT AXILLARY SENTINEL LYMPH NODE BIOPSY;  Surgeon: [name], MD;  Location: [place];  Service: General;  Laterality: Left;   BREAST LUMPECTOMY WITH RADIOACTIVE SEED LOCALIZATION Right 04/16/2020   Procedure: RIGHT BREAST LUMPECTOMY WITH RADIOACTIVE SEED LOCALIZATION;  Surgeon: [name], MD;  Location: [place];   Service: General;  Laterality: Right;   IR IMAGING GUIDED PORT INSERTION  10/18/2019   PORT-A-CATH REMOVAL Right 09/02/2020   Procedure: REMOVAL PORT-A-CATH;  Surgeon: [name], MD;  Location: [place];  Service: General;  Laterality: Right;    There were no vitals filed for this visit.   Subjective Assessment - 04/13/21 0819     Subjective I'm doing good. I've got a routine down. I do my self MLD at night, bandage my arm, and then do self MLD again in the morning before I put my compression garments back on and wear them for the day. My new garments haven't arrived yet.    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 It measures 9 mm and 1.4 cm both located in the upper outer quadrant. Ki67 is 90%; Lt lumpectomy and SLNB and Rt DCIS lumpectomy 04/16/2020. Completed radiation    Patient Stated Goals anything for the swelling    Currently in Pain? No/denies                               [facility] Adult PT Treatment/Exercise - 04/13/21 0001       Manual Therapy   Manual Lymphatic Drainage (MLD) in  supine, short neck, superficial (instructed pt in this today) and deep abdominal work. Rt axillary nodes, Lt inguinal nodes and anterior axillo- inguinal and Lt axillo-inguinal anastamosis, then left UE from shoulder to hand with return along pathways and reviewing with pt througout while performing    Compression Bandaging Pt performed all with therapist cuing during: Elastomull to fingers 1-4,, 1-6, and 1-10 cm short stretch compression bandages from hand to axilla (just practiced this to assess her techinque but did not leave on as pt is going to wear her compresion garments so did not do all steps)                       PT Short Term Goals - 03/31/21 1007       PT SHORT TERM GOAL #1   Title Pt will be ind with self MLD for the Rt  UE    Baseline Pt is mostly independent with this at this time benefitting from review at each session - 03/31/21    Status On-going               PT Long Term Goals - 04/07/21 1714       PT LONG TERM GOAL #1   Title Pt will maintain SOZO score of <6.5 increase from baseline to demonstrate independent lymphedema management.    Baseline Change from baseline today is 8.6 - 04/07/21    Status Not Met      PT LONG TERM GOAL #2   Title pt will be ind with compression bandaging for the UE to manage lymphedema    Time 4    Period Weeks    Status New      PT LONG TERM GOAL #3   Title Pt will obtain well fitting flat knit garment for lymphedema control    Time 4    Period Weeks    Status New      PT LONG TERM GOAL #4   Title NA      PT LONG TERM GOAL #5   Title NA      PT LONG TERM GOAL #6   Title nA                   Plan - 04/13/21 0904     Clinical Impression Statement Pt comes in feeling very encouraged with having a plan to reduce her subclinical lymphedema. She is performing self MLD 2x/day, applying compression bandages at night (assessed her technique with this today and she will benefit from further review for finger and hand sequence, her spacing with 10 cm bandage was improved), and is wearing her daytime compression sleeve and glove for duration of day. Her new garments have yet to arrive but these should arrive any day now. Also reviewed MLD while performing today. Pt plans to have one more visit this week and then will be placed on hold until she needs to return for her 30 day SOZO reassess while continuing with the plan she laid out today. Pt is making excellent progress with being and feeling competent with maintaining her subclinical lymphedema.    Stability/Clinical Decision Making Evolving/Moderate complexity    Rehab Potential Excellent    PT Frequency 2x / week    PT Duration 4 weeks    PT Treatment/Interventions ADLs/Self Care Home  Management;Therapeutic exercise;Scar mobilization;Passive range of motion;Patient/family education;Manual techniques    PT Next Visit Plan Cont and review compression bandaging; cont MLD as time allows. Assess  fit of compression garments if she brings them. May be on hold after next session until she returns for SOZO (schedule this)    Consulted and Agree with Plan of Care Patient             Patient will benefit from skilled therapeutic intervention in order to improve the following deficits and impairments:  Postural dysfunction, Decreased range of motion, Decreased knowledge of precautions, Impaired UE functional use, Pain, Increased edema, Decreased scar mobility  Visit Diagnosis: Lymphedema, not elsewhere classified  Aftercare following surgery for neoplasm  Abnormal posture  Stiffness of left shoulder, not elsewhere classified     Problem List Patient Active Problem List   Diagnosis Date Noted   Ductal carcinoma in situ (DCIS) of right breast 05/06/2020   Genetic testing 11/04/2019   Port-A-Cath in place 10/22/2019   Malignant neoplasm of upper-outer quadrant of left breast in female, estrogen receptor negative ([place]) 10/03/2019   Unspecified arthropathy, ankle and foot 09/24/2009   HALLUX RIGIDUS 09/24/2009    Otelia Limes, PTA 04/13/2021, 10:07 AM  [place] [facility] @ [facility] [place], [place], [address] Phone: [phone]   Fax:  [phone]  Name: Cheyenne Mcdonald MRN: [id number] Date of Birth: November 29, 1950   PHYSICAL THERAPY DISCHARGE SUMMARY  Visits from Start of Care: 11  Current functional level related to goals / functional outcomes: See above   Remaining deficits: Chronic lymphedema   Education / Equipment: Final plan  Plan: Patient agrees to discharge.

## 2021-04-14 ENCOUNTER — Encounter: Payer: Self-pay | Admitting: Rehabilitation

## 2021-04-15 ENCOUNTER — Ambulatory Visit: Payer: Medicare HMO

## 2021-04-22 DIAGNOSIS — R7303 Prediabetes: Secondary | ICD-10-CM | POA: Diagnosis not present

## 2021-04-22 DIAGNOSIS — E782 Mixed hyperlipidemia: Secondary | ICD-10-CM | POA: Diagnosis not present

## 2021-04-22 DIAGNOSIS — D0510 Intraductal carcinoma in situ of unspecified breast: Secondary | ICD-10-CM | POA: Diagnosis not present

## 2021-04-22 DIAGNOSIS — M1711 Unilateral primary osteoarthritis, right knee: Secondary | ICD-10-CM | POA: Diagnosis not present

## 2021-04-22 DIAGNOSIS — G4733 Obstructive sleep apnea (adult) (pediatric): Secondary | ICD-10-CM | POA: Diagnosis not present

## 2021-04-22 DIAGNOSIS — Z0000 Encounter for general adult medical examination without abnormal findings: Secondary | ICD-10-CM | POA: Diagnosis not present

## 2021-04-22 DIAGNOSIS — E559 Vitamin D deficiency, unspecified: Secondary | ICD-10-CM | POA: Diagnosis not present

## 2021-04-22 DIAGNOSIS — E669 Obesity, unspecified: Secondary | ICD-10-CM | POA: Diagnosis not present

## 2021-04-22 DIAGNOSIS — N182 Chronic kidney disease, stage 2 (mild): Secondary | ICD-10-CM | POA: Diagnosis not present

## 2021-05-04 DIAGNOSIS — D0510 Intraductal carcinoma in situ of unspecified breast: Secondary | ICD-10-CM | POA: Diagnosis not present

## 2021-05-04 DIAGNOSIS — R7303 Prediabetes: Secondary | ICD-10-CM | POA: Diagnosis not present

## 2021-05-04 DIAGNOSIS — G4733 Obstructive sleep apnea (adult) (pediatric): Secondary | ICD-10-CM | POA: Diagnosis not present

## 2021-05-04 DIAGNOSIS — E669 Obesity, unspecified: Secondary | ICD-10-CM | POA: Diagnosis not present

## 2021-05-04 DIAGNOSIS — E782 Mixed hyperlipidemia: Secondary | ICD-10-CM | POA: Diagnosis not present

## 2021-05-04 DIAGNOSIS — N182 Chronic kidney disease, stage 2 (mild): Secondary | ICD-10-CM | POA: Diagnosis not present

## 2021-05-04 DIAGNOSIS — E559 Vitamin D deficiency, unspecified: Secondary | ICD-10-CM | POA: Diagnosis not present

## 2021-05-04 DIAGNOSIS — M1711 Unilateral primary osteoarthritis, right knee: Secondary | ICD-10-CM | POA: Diagnosis not present

## 2021-05-07 DIAGNOSIS — H04123 Dry eye syndrome of bilateral lacrimal glands: Secondary | ICD-10-CM | POA: Diagnosis not present

## 2021-05-07 DIAGNOSIS — H25813 Combined forms of age-related cataract, bilateral: Secondary | ICD-10-CM | POA: Diagnosis not present

## 2021-05-07 DIAGNOSIS — H40023 Open angle with borderline findings, high risk, bilateral: Secondary | ICD-10-CM | POA: Diagnosis not present

## 2021-05-26 DIAGNOSIS — H25813 Combined forms of age-related cataract, bilateral: Secondary | ICD-10-CM | POA: Diagnosis not present

## 2021-05-26 DIAGNOSIS — H25811 Combined forms of age-related cataract, right eye: Secondary | ICD-10-CM | POA: Diagnosis not present

## 2021-06-17 DIAGNOSIS — Z0289 Encounter for other administrative examinations: Secondary | ICD-10-CM

## 2021-07-06 DIAGNOSIS — H269 Unspecified cataract: Secondary | ICD-10-CM | POA: Diagnosis not present

## 2021-07-06 DIAGNOSIS — H25811 Combined forms of age-related cataract, right eye: Secondary | ICD-10-CM | POA: Diagnosis not present

## 2021-07-20 ENCOUNTER — Encounter (HOSPITAL_COMMUNITY): Payer: Self-pay

## 2021-07-20 ENCOUNTER — Encounter: Payer: Self-pay | Admitting: Oncology

## 2021-07-22 DIAGNOSIS — H25812 Combined forms of age-related cataract, left eye: Secondary | ICD-10-CM | POA: Diagnosis not present

## 2021-07-27 ENCOUNTER — Encounter (INDEPENDENT_AMBULATORY_CARE_PROVIDER_SITE_OTHER): Payer: Self-pay | Admitting: Family Medicine

## 2021-07-27 ENCOUNTER — Ambulatory Visit (INDEPENDENT_AMBULATORY_CARE_PROVIDER_SITE_OTHER): Payer: Medicare HMO | Admitting: Family Medicine

## 2021-07-27 VITALS — BP 140/84 | HR 64 | Temp 97.5°F | Ht 63.0 in | Wt 220.0 lb

## 2021-07-27 DIAGNOSIS — C50412 Malignant neoplasm of upper-outer quadrant of left female breast: Secondary | ICD-10-CM

## 2021-07-27 DIAGNOSIS — F5089 Other specified eating disorder: Secondary | ICD-10-CM | POA: Diagnosis not present

## 2021-07-27 DIAGNOSIS — R7303 Prediabetes: Secondary | ICD-10-CM | POA: Diagnosis not present

## 2021-07-27 DIAGNOSIS — I158 Other secondary hypertension: Secondary | ICD-10-CM | POA: Diagnosis not present

## 2021-07-27 DIAGNOSIS — I10 Essential (primary) hypertension: Secondary | ICD-10-CM

## 2021-07-27 DIAGNOSIS — E559 Vitamin D deficiency, unspecified: Secondary | ICD-10-CM | POA: Diagnosis not present

## 2021-07-27 DIAGNOSIS — C50919 Malignant neoplasm of unspecified site of unspecified female breast: Secondary | ICD-10-CM

## 2021-07-27 DIAGNOSIS — R5383 Other fatigue: Secondary | ICD-10-CM | POA: Diagnosis not present

## 2021-07-27 DIAGNOSIS — Z6839 Body mass index (BMI) 39.0-39.9, adult: Secondary | ICD-10-CM

## 2021-07-27 DIAGNOSIS — G4733 Obstructive sleep apnea (adult) (pediatric): Secondary | ICD-10-CM | POA: Diagnosis not present

## 2021-07-27 DIAGNOSIS — R0602 Shortness of breath: Secondary | ICD-10-CM | POA: Diagnosis not present

## 2021-07-27 DIAGNOSIS — Z1331 Encounter for screening for depression: Secondary | ICD-10-CM

## 2021-07-27 NOTE — Progress Notes (Signed)
Office: [phone]  /  Fax: [phone]    Date: 08/02/2021   Appointment Start Time: 12:02pm Duration: 31 minutes Provider: Glennie Isle, Psy.D. Type of Session: Intake for Individual Therapy  Location of Patient: Parked in car (private location/address provided) Location of Provider: Provider's home (private office) Type of Contact: Telepsychological Visit via MyChart Video Visit  Informed Consent: Prior to proceeding with today's appointment, two pieces of identifying information were obtained. In addition, Cheyenne Mcdonald's physical location at the time of this appointment was obtained as well a phone number she could be reached at in the event of technical difficulties. Cheyenne Mcdonald and this provider participated in today's telepsychological service. Of note, today's appointment was switched to a regular telephone call at 12:08pm with Cheyenne Mcdonald's verbal consent due to technical issues on her end.   The provider's role was explained to [facility]. The provider reviewed and discussed issues of confidentiality, privacy, and limits therein (e.g., reporting obligations). In addition to verbal informed consent, written informed consent for psychological services was obtained prior to the initial appointment. Since the clinic is not a 24/7 crisis center, mental health emergency resources were shared and this  provider explained MyChart, e-mail, voicemail, and/or other messaging systems should be utilized only for non-emergency reasons. This provider also explained that information obtained during appointments will be placed in Cheyenne Mcdonald's medical record and relevant information will be shared with other providers at [facility] for coordination of care. [name] agreed information may be shared with other [facility] providers as needed for coordination of care and by signing the service agreement document, she provided written consent for coordination of care. Prior to initiating telepsychological  services, Cheyenne Mcdonald completed an informed consent document, which included the development of a safety plan (i.e., an emergency contact and emergency resources) in the event of an emergency/crisis. Cheyenne Mcdonald verbally acknowledged understanding she is ultimately responsible for understanding her insurance benefits for telepsychological and in-person services. This provider also reviewed confidentiality, as it relates to telepsychological services. Cheyenne Mcdonald  acknowledged understanding that appointments cannot be recorded without both party consent and she is aware she is responsible for securing confidentiality on her end of the session. Cheyenne Mcdonald verbally consented to proceed.  Chief Complaint/HPI: Cheyenne Mcdonald was referred by Cheyenne Mcdonald due to  other disorder of eating, emotional eating . Per the note for the initial visit with Cheyenne Mcdonald on 07/27/2021, "Pt reports she needed comfort food while undergoing cancer treatments but now does do emotional eating. She is interested in seeing a counselor for help with those symptoms." The note for the initial appointment further indicated the following: "Cheyenne Mcdonald's habits were reviewed today and are as follows: Her family eats meals together, she thinks her family will eat healthier with her, she struggles with family and or coworkers weight loss sabotage, her desired weight loss is 60 lbs, she has been heavy most of her life, she started gaining weight in 1976, her heaviest weight ever was 240 pounds, she has significant food cravings issues, she snacks frequently in the evenings, she skips meals frequently, she is frequently drinking liquids with calories, she frequently makes poor food choices, she frequently eats larger portions than normal, she has binge eating behaviors, and she struggles with emotional eating." Cheyenne Mcdonald's Food and Mood (modified PHQ-9) score on 07/27/2021 was 5.  During today's appointment, Cheyenne Mcdonald was verbally administered a questionnaire assessing various behaviors  related to emotional eating behaviors. Cheyenne Mcdonald endorsed the following: overeat when you are celebrating, experience food cravings  on a regular basis, eat certain foods when you are anxious, stressed, depressed, or your feelings are hurt, use food to help you cope with emotional situations, find food is comforting to you, overeat when you are angry or upset, overeat when you are worried about something, overeat frequently when you are bored or lonely, overeat when you are alone, but eat much less when you are with other people, and eat as a reward. She shared she craves sweets and salty foods. Cheyenne Mcdonald believes the onset of emotional eating behaviors was likely in the last 10 years, and described the current frequency of emotional eating behaviors as once a week. She added having a routine is helpful. In addition, Cheyenne Mcdonald denied a history of binge eating behaviors. Cheyenne Mcdonald denied a history of significantly restricting food intake, purging and engagement in other compensatory strategies, and has never been diagnosed with an eating disorder. She also denied a history of treatment for emotional eating. Furthermore, Cheyenne Mcdonald denied other problems of concern.    Mental Status Examination:  Appearance: neat Behavior: appropriate to circumstances Mood: anxious Affect: mood congruent Speech: WNL Eye Contact: appropriate Psychomotor Activity: WNL Gait: unable to assess  Thought Process: linear, logical, and goal directed and denies suicidal, homicidal, and self-harm ideation, plan and intent  Thought Content/Perception: no hallucinations, delusions, bizarre thinking or behavior endorsed or observed Orientation: AAOx4 Memory/Concentration: memory, attention, language, and fund of knowledge intact  Insight/Judgment: fair  Family & Psychosocial History: Cheyenne Mcdonald reported she is married and she has two adult children. She indicated she is currently retired, noting she volunteers with [employer]. Additionally,  Cheyenne Mcdonald shared her highest level of education obtained is a master's degree. Currently, Cheyenne Mcdonald's social support system consists of her husband, mother, children, and grandchildren. Moreover, Baylyn stated she resides with her husband.   Medical History:  Past Medical History:  Diagnosis Date   Anemia    Arthritis    gout big toe   Breast cancer ([place])    left breast IDC, right breast DCIS   History of radiation therapy 07/20/2020   bilateral breasts 06/01/2020-07/20/2020  Dr Gery Pray   Hypertension    Kidney problem    Obesity    Personal history of chemotherapy    2022 bilat lumpectomies w/rad w/chemo   Personal history of radiation therapy    2022 bilat lumpectomies w/rad w/chemo   Prediabetes    Sleep apnea    does not use CPAP   Vitamin D deficiency    Past Surgical History:  Procedure Laterality Date   ABDOMINAL HYSTERECTOMY     BREAST LUMPECTOMY Bilateral    2022 bilat lumpectomies w/rad w/chemo   BREAST LUMPECTOMY WITH RADIOACTIVE SEED AND SENTINEL LYMPH NODE BIOPSY Left 04/16/2020   Procedure: LEFT BREAST LUMPECTOMY WITH RADIOACTIVE SEED AND LEFT AXILLARY SENTINEL LYMPH NODE BIOPSY;  Surgeon: [name], MD;  Location: [place];  Service: General;  Laterality: Left;   BREAST LUMPECTOMY WITH RADIOACTIVE SEED LOCALIZATION Right 04/16/2020   Procedure: RIGHT BREAST LUMPECTOMY WITH RADIOACTIVE SEED LOCALIZATION;  Surgeon: [name], MD;  Location: [place];  Service: General;  Laterality: Right;   IR IMAGING GUIDED PORT INSERTION  10/18/2019   PORT-A-CATH REMOVAL Right 09/02/2020   Procedure: REMOVAL PORT-A-CATH;  Surgeon: [name], MD;  Location: [place];  Service: General;  Laterality: Right;   Current Outpatient Medications on File Prior to Visit  Medication Sig Dispense Refill   allopurinol (ZYLOPRIM) 100 MG tablet Take  100 mg by mouth daily. (Patient not taking: Reported on 08/31/2020)      Cholecalciferol (VITAMIN D-3) 125 MCG (5000 UT) TABS 1 cap(s)     tamoxifen (NOLVADEX) 20 MG tablet Take 20 mg by mouth daily.     [DISCONTINUED] prochlorperazine (COMPAZINE) 10 MG tablet Take 1 tablet (10 mg total) by mouth every 6 (six) hours as needed (Nausea or vomiting). (Patient not taking: Reported on 02/04/2020) 30 tablet 1   No current facility-administered medications on file prior to visit.  Medication compliant.   Mental Health History: Cedrica reported she has never attended therapeutic services. She denied a history of psychotropic medications. Yuleni reported there is no history of hospitalizations for psychiatric concerns. Annikah denied a family history of mental health/substance abuse related concerns. Kamorie reported there is no history of trauma including psychological, physical , and sexual abuse, as well as neglect.   Jaydalyn described her typical mood lately as "very optimistic," but "struggling with this weight." She reported experiencing anxiety when driving over bridges. She stated it does not stop her from driving and she copes by calling her husband while driving over a bridge. Jessika denied current alcohol use. She denied tobacco use. She denied illicit/recreational substance use. Furthermore, Jocilynn indicated she is not experiencing the following: hallucinations and delusions, paranoia, symptoms of mania , social withdrawal, crying spells, panic attacks, memory concerns, attention and concentration issues, and obsessions and compulsions. She also denied history of and current suicidal ideation, plan, and intent; history of and current homicidal ideation, plan, and intent; and history of and current engagement in self-harm.  The following strengths were reported by [facility]: "very, very optimistic about life and living," and "conscious" about weight. The following strengths were observed by this provider: ability to express thoughts and feelings during the therapeutic session, ability to establish  and benefit from a therapeutic relationship, willingness to work toward established goal(s) with the clinic and ability to engage in reciprocal conversation.   Legal History: Lolita reported there is no history of legal involvement.   Structured Assessments Results: The Patient Health Questionnaire-9 (PHQ-9) is a self-report measure that assesses symptoms and severity of depression over the course of the last two weeks. Leonor obtained a score of 3 suggesting minimal depression. Lexii finds the endorsed symptoms to be not difficult at all. [0= Not at all; 1= Several days; 2= More than half the days; 3= Nearly every day] Little interest or pleasure in doing things 0  Feeling down, depressed, or hopeless 0  Trouble falling or staying asleep, or sleeping too much 1  Feeling tired or having little energy 1  Poor appetite or overeating 1  Feeling bad about yourself --- or that you are a failure or have let yourself or your family down 0  Trouble concentrating on things, such as reading the newspaper or watching television 0  Moving or speaking so slowly that other people could have noticed? Or the opposite --- being so fidgety or restless that you have been moving around a lot more than usual 0  Thoughts that you would be better off dead or hurting yourself in some way 0  PHQ-9 Score 3    The Generalized Anxiety Disorder-7 (GAD-7) is a brief self-report measure that assesses symptoms of anxiety over the course of the last two weeks. Cherylanne obtained a score of 0. [0= Not at all; 1= Several days; 2= Over half the days; 3= Nearly every day] Feeling nervous, anxious, on edge 0  Not being  able to stop or control worrying 0  Worrying too much about different things 0  Trouble relaxing 0  Being so restless that it's hard to sit still 0  Becoming easily annoyed or irritable 0  Feeling afraid as if something awful might happen 0  GAD-7 Score 0   Interventions:  Conducted a chart review Focused on rapport  building Verbally administered PHQ-9 and GAD-7 for symptom monitoring Verbally administered Food & Mood questionnaire to assess various behaviors related to emotional eating Provided emphatic reflections and validation Collaborated with patient on a treatment goal  Psychoeducation provided regarding physical versus emotional hunger  Diagnostic Impressions & Provisional DSM-5 Diagnosis(es): Shaneece stated she has noticed engagement in emotional eating behaviors secondary to various emotions starting 10 years ago and described the current frequency as weekly. She denied engagement in any other disordered eating behaviors (e.g., purging, binging). As such, the following diagnosis was assigned: F50.89 Other Specified Feeding or Eating Disorder, Emotional Eating Behaviors.  Plan: Rebeca appears able and willing to participate as evidenced by collaboration on a treatment goal, engagement in reciprocal conversation, and asking questions as needed for clarification. Based on Keely's volunteer schedule, the next appointment is scheduled for 08/24/2021 at 8am, which will be via [place] Visit. The following treatment goal was established: increase coping skills. This provider will regularly review the treatment plan and medical chart to keep informed of status changes. Lavanna expressed understanding and agreement with the initial treatment plan of care. Jorene will be sent a handout via e-mail to utilize between now and the next appointment to increase awareness of hunger patterns and subsequent eating. Vessie provided verbal consent during today's appointment for this provider to send the handout via e-mail.

## 2021-07-28 LAB — CBC WITH DIFFERENTIAL/PLATELET
Basophils Absolute: 0 10*3/uL (ref 0.0–0.2)
Basos: 1 %
EOS (ABSOLUTE): 0.1 10*3/uL (ref 0.0–0.4)
Eos: 3 %
Hematocrit: 37.3 % (ref 34.0–46.6)
Hemoglobin: 12.9 g/dL (ref 11.1–15.9)
Immature Grans (Abs): 0 10*3/uL (ref 0.0–0.1)
Immature Granulocytes: 0 %
Lymphocytes Absolute: 1.1 10*3/uL (ref 0.7–3.1)
Lymphs: 26 %
MCH: 32.3 pg (ref 26.6–33.0)
MCHC: 34.6 g/dL (ref 31.5–35.7)
MCV: 93 fL (ref 79–97)
Monocytes Absolute: 0.7 10*3/uL (ref 0.1–0.9)
Monocytes: 16 %
Neutrophils Absolute: 2.3 10*3/uL (ref 1.4–7.0)
Neutrophils: 54 %
Platelets: 139 10*3/uL — ABNORMAL LOW (ref 150–450)
RBC: 4 x10E6/uL (ref 3.77–5.28)
RDW: 12.5 % (ref 11.7–15.4)
WBC: 4.2 10*3/uL (ref 3.4–10.8)

## 2021-07-28 LAB — TSH: TSH: 2.2 u[IU]/mL (ref 0.450–4.500)

## 2021-07-28 LAB — COMPREHENSIVE METABOLIC PANEL
ALT: 14 IU/L (ref 0–32)
AST: 20 IU/L (ref 0–40)
Albumin/Globulin Ratio: 2 (ref 1.2–2.2)
Albumin: 4.3 g/dL (ref 3.8–4.8)
Alkaline Phosphatase: 53 IU/L (ref 44–121)
BUN/Creatinine Ratio: 14 (ref 12–28)
BUN: 15 mg/dL (ref 8–27)
Bilirubin Total: 0.3 mg/dL (ref 0.0–1.2)
CO2: 22 mmol/L (ref 20–29)
Calcium: 9.3 mg/dL (ref 8.7–10.3)
Chloride: 102 mmol/L (ref 96–106)
Creatinine, Ser: 1.04 mg/dL — ABNORMAL HIGH (ref 0.57–1.00)
Globulin, Total: 2.1 g/dL (ref 1.5–4.5)
Glucose: 88 mg/dL (ref 70–99)
Potassium: 4.1 mmol/L (ref 3.5–5.2)
Sodium: 140 mmol/L (ref 134–144)
Total Protein: 6.4 g/dL (ref 6.0–8.5)
eGFR: 57 mL/min/{1.73_m2} — ABNORMAL LOW (ref >59)

## 2021-07-28 LAB — HEMOGLOBIN A1C
Est. average glucose Bld gHb Est-mCnc: 123 mg/dL
Hgb A1c MFr Bld: 5.9 % — ABNORMAL HIGH (ref 4.8–5.6)

## 2021-07-28 LAB — LIPID PANEL WITH LDL/HDL RATIO
Cholesterol, Total: 152 mg/dL (ref 100–199)
HDL: 53 mg/dL (ref >39)
LDL Chol Calc (NIH): 83 mg/dL (ref 0–99)
LDL/HDL Ratio: 1.6 ratio (ref 0.0–3.2)
Triglycerides: 86 mg/dL (ref 0–149)
VLDL Cholesterol Cal: 16 mg/dL (ref 5–40)

## 2021-07-28 LAB — VITAMIN B12: Vitamin B-12: 328 pg/mL (ref 232–1245)

## 2021-07-28 LAB — FOLATE: Folate: 12.7 ng/mL (ref >3.0)

## 2021-07-28 LAB — VITAMIN D 25 HYDROXY (VIT D DEFICIENCY, FRACTURES): Vit D, 25-Hydroxy: 55.4 ng/mL (ref 30.0–100.0)

## 2021-07-28 LAB — T4, FREE: Free T4: 1.04 ng/dL (ref 0.82–1.77)

## 2021-07-28 LAB — INSULIN, RANDOM: INSULIN: 16.9 u[IU]/mL (ref 2.6–24.9)

## 2021-07-31 NOTE — Progress Notes (Unsigned)
Chief Complaint:   OBESITY Cheyenne Mcdonald (MR# [id number]) is a 71 y.o. female who presents for evaluation and treatment of obesity and related comorbidities. Current BMI is Body mass index is 38.97 kg/m. Cheyenne Mcdonald has been struggling with her weight for many years and has been unsuccessful in either losing weight, maintaining weight loss, or reaching her healthy weight goal.  Cheyenne Mcdonald is currently in the action stage of change and ready to dedicate time achieving and maintaining a healthier weight. Cheyenne Mcdonald is interested in becoming our patient and working on intensive lifestyle modifications including (but not limited to) diet and exercise for weight loss.  Prior to being diagnosed with breast cancer, pt lost 60 lbs and gained 40 lbs back. She is a retired Animal nutritionist and lives with her husband, [name]. Pt skips breakfast and craves carbs and sweets. She sees [facility], therefore she is not on [facility] system.  Cheyenne Mcdonald's habits were reviewed today and are as follows: Her family eats meals together, she thinks her family will eat healthier with her, she struggles with family and or coworkers weight loss sabotage, her desired weight loss is 60 lbs, she has been heavy most of her life, she started gaining weight in 1976, her heaviest weight ever was 240 pounds, she has significant food cravings issues, she snacks frequently in the evenings, she skips meals frequently, she is frequently drinking liquids with calories, she frequently makes poor food choices, she frequently eats larger portions than normal, she has binge eating behaviors, and she struggles with emotional eating.  Depression Screen Almeter's Food and Mood (modified PHQ-9) score was 5.     07/27/2021    8:38 AM  Depression screen PHQ 2/9  Decreased Interest 1  Down, Depressed, Hopeless 1  PHQ - 2 Score 2  Altered sleeping 2  Tired, decreased energy 1  PHQ-9 Score 5   Subjective:   1. Other fatigue Cheyenne Mcdonald admits to daytime  somnolence and admits to waking up still tired. Patient has a history of symptoms of daytime fatigue, morning fatigue, and morning headache. Cheyenne Mcdonald generally gets 7 hours of sleep per night, and states that she has generally restful sleep. Snoring is present. Apneic episodes are present. Epworth Sleepiness Score is 7. ECG with normal sinus rhythm at 64 beats per minute and no acute abnormalities. Pt asymptomatic at the time of ECG.  2. SOB (shortness of breath) on exertion Cheyenne Mcdonald notes increasing shortness of breath with exercising and seems to be worsening over time with weight gain. She notes getting out of breath sooner with activity than she used to. This has gotten worse recently. Cheyenne Mcdonald denies shortness of breath at rest or orthopnea.  3. Prediabetes Diagnosed 10+ years ago. Medication: None  4. Essential hypertension Diagnosed 10 years ago. Pt was given low dose BP meds within the last 5 years and discontinued use of meds within the last 2 years. She reports BP at home runs 120's/70-80's.   5. Vitamin D deficiency Pt hasn't taken any supplements in about 2-3 weeks. She was concerned Vit D level would be too high. Medication: OTC Vit D 5000 IU.  6. OSA (obstructive sleep apnea) Diagnosed with mild OSA and has a CPAP but is not using it regularly.  7. Malignant neoplasm of female breast, unspecified estrogen receptor status, unspecified laterality, unspecified site of breast ([place]) Dr. Jana Hakim referred pt to [place]. Pt finished chemo and radiation with surgery July 20, 2020.  8. Other disorder of eating,  emotional eating Pt reports she needed comfort food while undergoing cancer treatments but now does do emotional eating. She is interested in seeing a counselor for help with those symptoms.  Assessment/Plan:   Orders Placed This Encounter  Procedures   VITAMIN D 25 Hydroxy (Vit-D Deficiency, Fractures)   TSH   T4, free   Lipid Panel With LDL/HDL Ratio   Insulin, random   Hemoglobin A1c    Folate   Comprehensive metabolic panel   CBC with Differential/Platelet   Vitamin B12   EKG 12-Lead    1. Other fatigue Cheyenne Mcdonald does feel that her weight is causing her energy to be lower than it should be. Fatigue may be related to obesity, depression or many other causes. Labs will be ordered, and in the meanwhile, Cheyenne Mcdonald will focus on self care including making healthy food choices, increasing physical activity and focusing on stress reduction. Obtain fasting labs today.  - EKG 12-Lead - TSH - T4, free - Folate - Vitamin B12  2. SOB (shortness of breath) on exertion Cheyenne Mcdonald does feel that she gets out of breath more easily that she used to when she exercises. Cheyenne Mcdonald's shortness of breath appears to be obesity related and exercise induced. She has agreed to work on weight loss and gradually increase exercise to treat her exercise induced shortness of breath. Will continue to monitor closely.  3. Prediabetes Cheyenne Mcdonald will continue to work on weight loss, exercise, and decreasing simple carbohydrates to help decrease the risk of diabetes. Obtain fasting labs today.  - Insulin, random - Hemoglobin A1c  4. Essential hypertension A little high today, but pt nervous for appt. Cheyenne Mcdonald is working on healthy weight loss and exercise to improve blood pressure control. We will watch for signs of hypotension as she continues her lifestyle modifications. Obtain fasting labs today.  - Lipid Panel With LDL/HDL Ratio - Comprehensive metabolic panel - CBC with Differential/Platelet  5. Vitamin D deficiency Low Vitamin D level contributes to fatigue and are associated with obesity, breast, and colon cancer. She agrees to continue OTC Vitamin D 5,000 IU and will follow-up for routine testing of Vitamin D, at least 2-3 times per year to avoid over-replacement. Obtain fasting labs today.  - VITAMIN D 25 Hydroxy (Vit-D Deficiency, Fractures)  6. OSA (obstructive sleep apnea) Intensive lifestyle modifications are  the first line treatment for this issue. We discussed several lifestyle modifications today and she will continue to work on diet, exercise and weight loss efforts. We will continue to monitor. Orders and follow up as documented in patient record. Use CPAP nightly per recommendations of sleep med doctor. Counseling done.  7. Malignant neoplasm of female breast, unspecified estrogen receptor status, unspecified laterality, unspecified site of breast ([place]) Continue follow up with oncology as directed.  8. Other disorder of eating, emotional eating Behavior modification techniques were discussed today to help Dauna deal with her emotional/non-hunger eating behaviors.  Orders and follow up as documented in patient record. Refer to Dr. Mallie Mussel.  9. Depression screening Alijah had a positive depression screening. Depression is commonly associated with obesity and often results in emotional eating behaviors. We will monitor this closely and work on CBT to help improve the non-hunger eating patterns. Referral to Psychology may be required if no improvement is seen as she continues in our clinic.  10. Class 2 severe obesity with serious comorbidity and body mass index (BMI) of 39.0 to 39.9 in adult, unspecified obesity type (HCC) Shaletta is currently in the action stage  of change and her goal is to continue with weight loss efforts. I recommend Cristan begin the structured treatment plan as follows:  She has agreed to the Category 1 Plan.  Exercise goals:  As is    Behavioral modification strategies: increasing lean protein intake and decreasing simple carbohydrates.  She was informed of the importance of frequent follow-up visits to maximize her success with intensive lifestyle modifications for her multiple health conditions. She was informed we would discuss her lab results at her next visit unless there is a critical issue that needs to be addressed sooner. Jessia agreed to keep her next visit at the agreed upon  time to discuss these results.  Objective:   Blood pressure 140/84, pulse 64, temperature (!) 97.5 F (36.4 C), height '5\' 3"'$  (1.6 m), weight 220 lb (99.8 kg), SpO2 100 %. Body mass index is 38.97 kg/m.  EKG: Normal sinus rhythm, rate 64.  Indirect Calorimeter completed today shows a VO2 of 188 and a REE of 1296.  Her calculated basal metabolic rate is 8527 thus her basal metabolic rate is worse than expected.  General: Cooperative, alert, well developed, in no acute distress. HEENT: Conjunctivae and lids unremarkable. Cardiovascular: Regular rhythm.  Lungs: Normal work of breathing. Neurologic: No focal deficits.   Lab Results  Component Value Date   CREATININE 1.04 (H) 07/27/2021   BUN 15 07/27/2021   NA 140 07/27/2021   K 4.1 07/27/2021   CL 102 07/27/2021   CO2 22 07/27/2021   Lab Results  Component Value Date   ALT 14 07/27/2021   AST 20 07/27/2021   ALKPHOS 53 07/27/2021   BILITOT 0.3 07/27/2021   Lab Results  Component Value Date   HGBA1C 5.9 (H) 07/27/2021   Lab Results  Component Value Date   INSULIN 16.9 07/27/2021   Lab Results  Component Value Date   TSH 2.200 07/27/2021   Lab Results  Component Value Date   CHOL 152 07/27/2021   HDL 53 07/27/2021   LDLCALC 83 07/27/2021   TRIG 86 07/27/2021   Lab Results  Component Value Date   WBC 4.2 07/27/2021   HGB 12.9 07/27/2021   HCT 37.3 07/27/2021   MCV 93 07/27/2021   PLT 139 (L) 07/27/2021   No results found for: "IRON", "TIBC", "FERRITIN" Obesity Behavioral Intervention:   Approximately 15 minutes were spent on the discussion below.  ASK: We discussed the diagnosis of obesity with Blimy today and Lavaya agreed to give [place] permission to discuss obesity behavioral modification therapy today.  ASSESS: Blima has the diagnosis of obesity and her BMI today is 39.1. Logan is in the action stage of change.   ADVISE: Torunn was educated on the multiple health risks of obesity as well as the benefit of  weight loss to improve her health. She was advised of the need for long term treatment and the importance of lifestyle modifications to improve her current health and to decrease her risk of future health problems.  AGREE: Multiple dietary modification options and treatment options were discussed and Franceska agreed to follow the recommendations documented in the above note.  ARRANGE: Ellissa was educated on the importance of frequent visits to treat obesity as outlined per CMS and USPSTF guidelines and agreed to schedule her next follow up appointment today.  Attestation Statements:   Reviewed by clinician on day of visit: allergies, medications, problem list, medical history, surgical history, family history, social history, and previous encounter notes.  I, Kathlene November, BS,  CMA, am acting as transcriptionist for [employer], DO.   I have reviewed the above documentation for accuracy and completeness, and I agree with the above. Marjory Sneddon, D.O.  The [place] was signed into law in 2016 which includes the topic of electronic health records.  This provides immediate access to information in MyChart.  This includes consultation notes, operative notes, office notes, lab results and pathology reports.  If you have any questions about what you read please let us know at your next visit so we can discuss your concerns and take corrective action if need be.  We are right here with you.

## 2021-08-02 ENCOUNTER — Telehealth (INDEPENDENT_AMBULATORY_CARE_PROVIDER_SITE_OTHER): Payer: Medicare HMO | Admitting: Psychology

## 2021-08-02 DIAGNOSIS — F5089 Other specified eating disorder: Secondary | ICD-10-CM

## 2021-08-03 DIAGNOSIS — H268 Other specified cataract: Secondary | ICD-10-CM | POA: Diagnosis not present

## 2021-08-03 DIAGNOSIS — H25812 Combined forms of age-related cataract, left eye: Secondary | ICD-10-CM | POA: Diagnosis not present

## 2021-08-03 DIAGNOSIS — H269 Unspecified cataract: Secondary | ICD-10-CM | POA: Diagnosis not present

## 2021-08-05 DIAGNOSIS — E669 Obesity, unspecified: Secondary | ICD-10-CM | POA: Diagnosis not present

## 2021-08-05 DIAGNOSIS — M1711 Unilateral primary osteoarthritis, right knee: Secondary | ICD-10-CM | POA: Diagnosis not present

## 2021-08-05 DIAGNOSIS — G4733 Obstructive sleep apnea (adult) (pediatric): Secondary | ICD-10-CM | POA: Diagnosis not present

## 2021-08-05 DIAGNOSIS — R7303 Prediabetes: Secondary | ICD-10-CM | POA: Diagnosis not present

## 2021-08-05 DIAGNOSIS — N182 Chronic kidney disease, stage 2 (mild): Secondary | ICD-10-CM | POA: Diagnosis not present

## 2021-08-05 DIAGNOSIS — E559 Vitamin D deficiency, unspecified: Secondary | ICD-10-CM | POA: Diagnosis not present

## 2021-08-05 DIAGNOSIS — D0510 Intraductal carcinoma in situ of unspecified breast: Secondary | ICD-10-CM | POA: Diagnosis not present

## 2021-08-05 DIAGNOSIS — E782 Mixed hyperlipidemia: Secondary | ICD-10-CM | POA: Diagnosis not present

## 2021-08-10 ENCOUNTER — Ambulatory Visit (INDEPENDENT_AMBULATORY_CARE_PROVIDER_SITE_OTHER): Payer: Medicare HMO | Admitting: Family Medicine

## 2021-08-10 ENCOUNTER — Encounter (INDEPENDENT_AMBULATORY_CARE_PROVIDER_SITE_OTHER): Payer: Self-pay | Admitting: Family Medicine

## 2021-08-10 VITALS — BP 124/79 | HR 84 | Temp 97.8°F | Ht 63.0 in | Wt 216.0 lb

## 2021-08-10 DIAGNOSIS — F5089 Other specified eating disorder: Secondary | ICD-10-CM | POA: Diagnosis not present

## 2021-08-10 DIAGNOSIS — I10 Essential (primary) hypertension: Secondary | ICD-10-CM | POA: Diagnosis not present

## 2021-08-10 DIAGNOSIS — E669 Obesity, unspecified: Secondary | ICD-10-CM | POA: Diagnosis not present

## 2021-08-10 DIAGNOSIS — R7303 Prediabetes: Secondary | ICD-10-CM | POA: Diagnosis not present

## 2021-08-10 DIAGNOSIS — E559 Vitamin D deficiency, unspecified: Secondary | ICD-10-CM | POA: Diagnosis not present

## 2021-08-10 DIAGNOSIS — Z6838 Body mass index (BMI) 38.0-38.9, adult: Secondary | ICD-10-CM | POA: Diagnosis not present

## 2021-08-10 DIAGNOSIS — E538 Deficiency of other specified B group vitamins: Secondary | ICD-10-CM | POA: Diagnosis not present

## 2021-08-10 DIAGNOSIS — G4733 Obstructive sleep apnea (adult) (pediatric): Secondary | ICD-10-CM

## 2021-08-10 MED ORDER — VITAMIN B12 500 MCG PO TABS: ORAL_TABLET | ORAL | Status: DC

## 2021-08-12 NOTE — Progress Notes (Signed)
Chief Complaint:   OBESITY Cheyenne Mcdonald is here to discuss her progress with her obesity treatment plan along with follow-up of her obesity related diagnoses. Cheyenne Mcdonald is on the Category 1 Plan and states she is following her eating plan approximately 40% of the time. Cheyenne Mcdonald states she is not currently exercising.  Today's visit was #: 2 Starting weight: 220 lbs Starting date: 07/27/2021 Today's weight: 216 lbs Today's date: 08/10/2021 Total lbs lost to date: 4 Total lbs lost since last in-office visit: 4  Interim History: Cheyenne Mcdonald is here today for her first follow-up office visit since starting the program with [place].  All blood work/ lab tests that were recently ordered by myself or an outside provider were reviewed with patient today per their request.   Extended time was spent counseling her on all new disease processes that were discovered or preexisting ones that are affected by BMI.  she understands that many of these abnormalities will need to monitored regularly along with the current treatment plan of prudent dietary changes, in which we are making each and every office visit, to improve these health parameters. This is Cheyenne Mcdonald's first follow up and she lost 4 lbs, and is following plan 40% of the time. She eats on plan for breakfast, struggles with lunch, therefore skips it most days; and is not weighing or measuring vegetables. Pt is on category 1.  Subjective:   1. Prediabetes Worsening. Discussed labs with patient today. Cheyenne Mcdonald's A1c 2 years ago was 5.7 and is now 5.9. She denies hunger or cravings but wasn't on meal plan much.  2. Essential hypertension Discussed labs with patient today. Pt is diet controlled currently. She has been off meds for approximately 2 years. Pt had seen nephrology in the past for elevated creatinine level and was told she had no issues, but she needs to increase water intake and lose weight.  3. Vitamin D deficiency Discussed labs with patient today. She is  currently taking OTC vitamin D 5000 IU each day. She denies nausea, vomiting or muscle weakness.  4. B12 nutritional deficiency Discussed labs with patient today. Pt has no prior history of deficiency. She does reports fatigue/tiredness.  5. Other disorder of eating, emotional eating Cheyenne Mcdonald met with Dr. Mallie Mussel and it went well. She feels it will be useful to help with her emotional eating habits.  6. OSA (obstructive sleep apnea) She is not using her CPAP,as she can't find it and feels rested with great energy in the mornings on most days.  Assessment/Plan:  No orders of the defined types were placed in this encounter.   There are no discontinued medications.   Meds ordered this encounter  Medications   cyanocobalamin 500 MCG TABS    Sig: 300-519mg qd     1. Prediabetes Cheyenne Mcdonald will continue to work on weight loss, exercise, and decreasing simple carbohydrates to help decrease the risk of diabetes.  I recommend pt follow meal plan more than 40%. She declines meds and would like to avoid all, if possible. Handout: Metformin; Pre-diabetes  2. Essential hypertension Stable BP at goal. Cheyenne Mcdonald working on healthy weight loss and exercise to improve blood pressure control. We will watch for signs of hypotension as she continues her lifestyle modifications. CMP within normal limits, except serum creatinine and GFR. Education done. Continue prudent nutritional plan and home monitoring.   3. Vitamin D deficiency At goal at 55.4. Low Vitamin D level contributes to fatigue and are associated with obesity,  breast, and colon cancer. She agrees to continue OTC Vitamin D 5,000 IU daily and will follow-up for routine testing of Vitamin D, at least 2-3 times per year to avoid over-replacement. Counseling done.  4. B12 nutritional deficiency Not at goal of 500 or greater. The diagnosis was reviewed with the patient. Counseling provided today, see below. We will continue to monitor. Orders and follow up  as documented in patient record. Continue prudent nutritional plan and start 300-500 mcg B12 daily.  Counseling The body needs vitamin B12: to make red blood cells; to make DNA; and to help the nerves work properly so they can carry messages from the brain to the body.  The main causes of vitamin B12 deficiency include dietary deficiency, digestive diseases, pernicious anemia, and having a surgery in which part of the stomach or small intestine is removed.  Certain medicines can make it harder for the body to absorb vitamin B12. These medicines include: heartburn medications; some antibiotics; some medications used to treat diabetes, gout, and high cholesterol.  In some cases, there are no symptoms of this condition. If the condition leads to anemia or nerve damage, various symptoms can occur, such as weakness or fatigue, shortness of breath, and numbness or tingling in your hands and feet.   Treatment:  May include taking vitamin B12 supplements.  Avoid alcohol.  Eat lots of healthy foods that contain vitamin B12: Beef, pork, chicken, [place], and organ meats, such as liver.  Seafood: This includes clams, rainbow trout, salmon, tuna, and haddock. Eggs.  Cereal and dairy products that are fortified: This means that vitamin B12 has been added to the food.   Start- cyanocobalamin 500 MCG TABS; 300-500 mcg qd  5. Other disorder of eating, emotional eating Behavior modification techniques were discussed today to help Cheyenne Mcdonald deal with her emotional/non-hunger eating behaviors.  Orders and follow up as documented in patient record. Continue with Dr. Mallie Mussel as scheduled for counseling. Pt is working on the difference between emotional and physical hunger. Mindful and intentional eating discussed.  6. OSA (obstructive sleep apnea) Intensive lifestyle modifications are the first line treatment for this issue. We discussed several lifestyle modifications today and she will continue to work on diet, exercise  and weight loss efforts. We will continue to monitor. Orders and follow up as documented in patient record. I recommend pt use CPAP but she feels it is an uncomfortable and unnecessary sleep aid. Counseling done. Follow up with sleep med doctor for different apparatus.  7. Obesity, current BMI 38.3 Cheyenne Mcdonald is currently in the action stage of change. As such, her goal is to continue with weight loss efforts. She has agreed to the Category 1 Plan.   Weigh proteins and measure vegetables. She was given another copy of meal plan to laminate and mark off foods as she eats them.  Exercise goals:  As is  Behavioral modification strategies: celebration eating strategies.  Cheyenne Mcdonald has agreed to follow-up with our clinic in 2 weeks. She was informed of the importance of frequent follow-up visits to maximize her success with intensive lifestyle modifications for her multiple health conditions.   Objective:   Blood pressure 124/79, pulse 84, temperature 97.8 F (36.6 C), height _0  (1.6 m), weight 216 lb (98 kg), SpO2 100 %. Body mass index is 38.26 kg/m.  General: Cooperative, alert, well developed, in no acute distress. HEENT: Conjunctivae and lids unremarkable. Cardiovascular: Regular rhythm.  Lungs: Normal work of breathing. Neurologic: No focal deficits.   Lab Results  Component Value Date   CREATININE 1.04 (H) 07/27/2021   BUN 15 07/27/2021   NA 140 07/27/2021   K 4.1 07/27/2021   CL 102 07/27/2021   CO2 22 07/27/2021   Lab Results  Component Value Date   ALT 14 07/27/2021   AST 20 07/27/2021   ALKPHOS 53 07/27/2021   BILITOT 0.3 07/27/2021   Lab Results  Component Value Date   HGBA1C 5.9 (H) 07/27/2021   Lab Results  Component Value Date   INSULIN 16.9 07/27/2021   Lab Results  Component Value Date   TSH 2.200 07/27/2021   Lab Results  Component Value Date   CHOL 152 07/27/2021   HDL 53 07/27/2021   LDLCALC 83 07/27/2021   TRIG 86 07/27/2021   Lab Results   Component Value Date   VD25OH 55.4 07/27/2021   Lab Results  Component Value Date   WBC 4.2 07/27/2021   HGB 12.9 07/27/2021   HCT 37.3 07/27/2021   MCV 93 07/27/2021   PLT 139 (L) 07/27/2021    Attestation Statements:   Reviewed by clinician on day of visit: allergies, medications, problem list, medical history, surgical history, family history, social history, and previous encounter notes.  Time spent on visit including pre-visit chart review and post-visit care and charting was 48 minutes.   I, Kathlene November, BS, CMA, am acting as transcriptionist for [employer], DO.   I have reviewed the above documentation for accuracy and completeness, and I agree with the above. Marjory Sneddon, D.O.  [facility] [place] was signed into law in 2016 which includes the topic of electronic health records.  This provides immediate access to information in MyChart.  This includes consultation notes, operative notes, office notes, lab results and pathology reports.  If you have any questions about what you read please let us know at your next visit so we can discuss your concerns and take corrective action if need be.  We are right here with you.

## 2021-08-18 ENCOUNTER — Encounter (INDEPENDENT_AMBULATORY_CARE_PROVIDER_SITE_OTHER): Payer: Self-pay

## 2021-08-24 ENCOUNTER — Other Ambulatory Visit: Payer: Self-pay | Admitting: Hematology and Oncology

## 2021-08-24 ENCOUNTER — Telehealth (INDEPENDENT_AMBULATORY_CARE_PROVIDER_SITE_OTHER): Payer: Medicare HMO | Admitting: Psychology

## 2021-08-24 DIAGNOSIS — Z853 Personal history of malignant neoplasm of breast: Secondary | ICD-10-CM

## 2021-08-24 DIAGNOSIS — F5089 Other specified eating disorder: Secondary | ICD-10-CM | POA: Diagnosis not present

## 2021-08-24 NOTE — Progress Notes (Signed)
  Office: [phone]  /  Fax: [phone]    Date: August 24, 2021    Appointment Start Time: 8:04am Duration: 28 minutes Mcdonald: Glennie Isle, Psy.D. Type of Session: Individual Therapy  Location of Patient: Home (private location) Location of Mcdonald: Provider's Home (private office) Type of Contact: Telepsychological Visit via MyChart Video Visit  Session Content: Cheyenne Mcdonald called Cheyenne Mcdonald at 8:03am as she did not present for today's appointment. Cheyenne Mcdonald expressed challenges connecting; assistance was provided. As such, today's appointment was initiated 4 minutes late. Cheyenne Mcdonald is a 71 y.o. female presenting for a follow-up appointment to address the previously established treatment goal of increasing coping skills.Today's appointment was a telepsychological visit. Cheyenne Mcdonald provided verbal consent for today's telepsychological appointment and she is aware she is responsible for securing confidentiality on her end of the session. Prior to proceeding with today's appointment, Cheyenne Mcdonald's physical location at the time of Cheyenne appointment was obtained as well a phone number she could be reached at in the event of technical difficulties. Cheyenne Mcdonald and Cheyenne Mcdonald participated in today's telepsychological service.   Cheyenne Mcdonald conducted a brief check-in. Cheyenne Mcdonald reported, "I really realized I have emotional eating problems." Further explored and processed. She shared about her recent birthday celebrations and associated challenges. She described realizing she needs a "strict routine." Her recent eating habits were reviewed. It was reflected she attempted to make better choices and engage in portion control. Reviewed emotional and physical hunger. Due to additional upcoming celebration(s), psychoeducation regarding making better choices and engaging in portion control during holidays/celebrations/vacations was provided. More specifically, Cheyenne Mcdonald discussed the following strategies: coming to meals hungry, but  not starving; avoid filling up on appetizers; managing portion sizes; not completely depriving yourself; making the plate colorful (e.g., vegetables); pacing yourself (e.g., waiting 10 minutes before going back for seconds); taking advantage of the nutritious foods; practicing mindfulness; staying hydrated; and avoid bringing home leftovers. Overall, Cheyenne Mcdonald was receptive to today's appointment as evidenced by openness to sharing, responsiveness to feedback, and willingness to explore triggers for emotional eating.  Mental Status Examination:  Appearance: neat Behavior: appropriate to circumstances Mood: neutral Affect: mood congruent Speech: WNL Eye Contact: appropriate Psychomotor Activity: WNL Gait: unable to assess Thought Process: linear, logical, and goal directed and no evidence or endorsement of suicidal, homicidal, and self-harm ideation, plan and intent  Thought Content/Perception: no hallucinations, delusions, bizarre thinking or behavior endorsed or observed Orientation: AAOx4 Memory/Concentration: memory, attention, language, and fund of knowledge intact  Insight: fair Judgment: fair  Interventions:  Conducted a brief chart review Provided empathic reflections and validation Reviewed content from the previous session Employed supportive psychotherapy interventions to facilitate reduced distress and to improve coping skills with identified stressors Psychoeducation provided regarding strategies for celebrations/holidays/vacations  DSM-5 Diagnosis(es): F50.89 Other Specified Feeding or Eating Disorder, Emotional Eating Behaviors  Treatment Goal & Progress: During the initial appointment with Cheyenne Mcdonald, the following treatment goal was established: increase coping skills. Progress is limited, as Cheyenne Mcdonald has just begun treatment with Cheyenne Mcdonald; however, she is receptive to the interaction and interventions and rapport is being established.   Plan: The next appointment is  scheduled for 09/06/2021 at 8am, which will be via [place] Visit. The next session will focus on working towards the established treatment goal.

## 2021-08-26 ENCOUNTER — Encounter (INDEPENDENT_AMBULATORY_CARE_PROVIDER_SITE_OTHER): Payer: Self-pay | Admitting: Adult Health

## 2021-08-26 ENCOUNTER — Ambulatory Visit (INDEPENDENT_AMBULATORY_CARE_PROVIDER_SITE_OTHER): Payer: Medicare HMO | Admitting: Adult Health

## 2021-08-26 VITALS — BP 124/81 | HR 77 | Temp 98.5°F | Ht 63.0 in | Wt 218.0 lb

## 2021-08-26 DIAGNOSIS — E66812 Obesity, class 2: Secondary | ICD-10-CM

## 2021-08-26 DIAGNOSIS — R7989 Other specified abnormal findings of blood chemistry: Secondary | ICD-10-CM | POA: Diagnosis not present

## 2021-08-26 DIAGNOSIS — E669 Obesity, unspecified: Secondary | ICD-10-CM

## 2021-08-26 DIAGNOSIS — Z6838 Body mass index (BMI) 38.0-38.9, adult: Secondary | ICD-10-CM | POA: Diagnosis not present

## 2021-08-26 DIAGNOSIS — Z8739 Personal history of other diseases of the musculoskeletal system and connective tissue: Secondary | ICD-10-CM | POA: Diagnosis not present

## 2021-08-30 ENCOUNTER — Other Ambulatory Visit: Payer: Self-pay | Admitting: *Deleted

## 2021-08-30 DIAGNOSIS — C50412 Malignant neoplasm of upper-outer quadrant of left female breast: Secondary | ICD-10-CM

## 2021-08-31 ENCOUNTER — Encounter: Payer: Self-pay | Admitting: Hematology and Oncology

## 2021-08-31 ENCOUNTER — Other Ambulatory Visit: Payer: Self-pay | Admitting: *Deleted

## 2021-08-31 ENCOUNTER — Inpatient Hospital Stay: Payer: Medicare HMO | Admitting: Hematology and Oncology

## 2021-08-31 ENCOUNTER — Other Ambulatory Visit: Payer: Self-pay

## 2021-08-31 ENCOUNTER — Inpatient Hospital Stay: Payer: Medicare HMO | Attending: Hematology and Oncology

## 2021-08-31 VITALS — BP 137/71 | HR 65 | Temp 97.5°F | Resp 16 | Ht 63.0 in | Wt 218.5 lb

## 2021-08-31 DIAGNOSIS — C50412 Malignant neoplasm of upper-outer quadrant of left female breast: Secondary | ICD-10-CM | POA: Insufficient documentation

## 2021-08-31 DIAGNOSIS — Z923 Personal history of irradiation: Secondary | ICD-10-CM | POA: Diagnosis not present

## 2021-08-31 DIAGNOSIS — Z79810 Long term (current) use of selective estrogen receptor modulators (SERMs): Secondary | ICD-10-CM | POA: Diagnosis not present

## 2021-08-31 DIAGNOSIS — Z171 Estrogen receptor negative status [ER-]: Secondary | ICD-10-CM

## 2021-08-31 DIAGNOSIS — D0511 Intraductal carcinoma in situ of right breast: Secondary | ICD-10-CM | POA: Diagnosis not present

## 2021-08-31 DIAGNOSIS — Z9221 Personal history of antineoplastic chemotherapy: Secondary | ICD-10-CM | POA: Diagnosis not present

## 2021-08-31 LAB — CBC WITH DIFFERENTIAL (CANCER CENTER ONLY)
Abs Immature Granulocytes: 0.01 10*3/uL (ref 0.00–0.07)
Basophils Absolute: 0 10*3/uL (ref 0.0–0.1)
Basophils Relative: 0 %
Eosinophils Absolute: 0.1 10*3/uL (ref 0.0–0.5)
Eosinophils Relative: 2 %
HCT: 37.5 % (ref 36.0–46.0)
Hemoglobin: 13.2 g/dL (ref 12.0–15.0)
Immature Granulocytes: 0 %
Lymphocytes Relative: 35 %
Lymphs Abs: 1.3 10*3/uL (ref 0.7–4.0)
MCH: 31.7 pg (ref 26.0–34.0)
MCHC: 35.2 g/dL (ref 30.0–36.0)
MCV: 89.9 fL (ref 80.0–100.0)
Monocytes Absolute: 0.5 10*3/uL (ref 0.1–1.0)
Monocytes Relative: 14 %
Neutro Abs: 1.8 10*3/uL (ref 1.7–7.7)
Neutrophils Relative %: 49 %
Platelet Count: 150 10*3/uL (ref 150–400)
RBC: 4.17 MIL/uL (ref 3.87–5.11)
RDW: 12.1 % (ref 11.5–15.5)
WBC Count: 3.6 10*3/uL — ABNORMAL LOW (ref 4.0–10.5)
nRBC: 0 % (ref 0.0–0.2)

## 2021-08-31 LAB — CMP (CANCER CENTER ONLY)
ALT: 14 U/L (ref 0–44)
AST: 15 U/L (ref 15–41)
Albumin: 4.3 g/dL (ref 3.5–5.0)
Alkaline Phosphatase: 43 U/L (ref 38–126)
Anion gap: 5 (ref 5–15)
BUN: 19 mg/dL (ref 8–23)
CO2: 26 mmol/L (ref 22–32)
Calcium: 9.5 mg/dL (ref 8.9–10.3)
Chloride: 108 mmol/L (ref 98–111)
Creatinine: 1.17 mg/dL — ABNORMAL HIGH (ref 0.44–1.00)
GFR, Estimated: 50 mL/min — ABNORMAL LOW (ref >60)
Glucose, Bld: 117 mg/dL — ABNORMAL HIGH (ref 70–99)
Potassium: 4 mmol/L (ref 3.5–5.1)
Sodium: 139 mmol/L (ref 135–145)
Total Bilirubin: 0.4 mg/dL (ref 0.3–1.2)
Total Protein: 6.7 g/dL (ref 6.5–8.1)

## 2021-08-31 LAB — URIC ACID: Uric Acid, Serum: 8.6 mg/dL — ABNORMAL HIGH (ref 2.5–7.1)

## 2021-08-31 NOTE — Progress Notes (Signed)
Cheyenne Mcdonald  [phone] [phone]    ID: Cheyenne Mcdonald DOB: 27-Nov-1950  MR#: [id number]  FUX#:[id number]  Patient Care Team: Cheyenne Sailors, PA as PCP - General (Physician Assistant) Cheyenne Salina, MD as Consulting Physician (Obstetrics and Gynecology) Cheyenne Kaufmann, RN as Oncology Nurse Navigator Cheyenne Germany, RN as Oncology Nurse Navigator Cheyenne Keens, MD as Consulting Physician (General Surgery) Cheyenne Pray, MD as Consulting Physician (Radiation Oncology) Cheyenne Aguas, MD as Consulting Physician (Nephrology) Cheyenne Pike, MD as Consulting Physician (Hematology and Oncology) Cheyenne Pike, MD   CHIEF COMPLAINT: 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   She is doing amazingly well.  She has been tolerating tamoxifen well, she cannot believe she is almost a year since she started tamoxifen.  She denies any changes in her breast but she does not quite examine them.  She has a mammogram scheduled for September 20, 2021  She has gone back to the aquatic center, starting back to exercise. Rest of the pertinent 10 point ROS reviewed and negative  COVID 19 VACCINATION STATUS: Status post Shafter x2 with booster October 2021; also had COVID July 2022   HISTORY OF CURRENT ILLNESS: From the original intake note:  Cheyenne Mcdonald presented for her routine mammography with a palpable left breast lump. She underwent bilateral diagnostic mammography with tomography and left breast ultrasonography at [facility] on 09/13/2019 showing: breast density category B; two adjacent masses in left breast at 1 o'clock, spanning approximately 3.3 cm; one indeterminate left axillary lymph node with 0.4 cm cortical bulge; no evidence right breast  malignancy.  Accordingly on 09/27/2019 she proceeded to biopsy of the left breast areas in question. The pathology from this procedure ([id number]) showed: invasive ductal carcinoma, grade 3, present in both of the adjacent masses. Prognostic indicators significant for: estrogen receptor, 0% negative and progesterone receptor, 0% negative. Proliferation marker Ki67 at 90%. HER2 equivocal by immunohistochemistry (2+), but negative by fluorescent in situ hybridization with a signals ratio 2.2 and number per cell 3.3. Additional analysis of more cells showed a signals ratio 2.05 and number per cell 3.13.  The biopsied lymph node was negative for carcinoma.  This was felt to be concordant  The patient's subsequent history is as detailed below.   PAST MEDICAL HISTORY: Past Medical History:  Diagnosis Date   Anemia    Arthritis    gout big toe   Breast cancer ([place])    left breast IDC, right breast DCIS   History of radiation therapy 07/20/2020   bilateral breasts 06/01/2020-07/20/2020  Dr Cheyenne Mcdonald   Hypertension    Kidney problem    Obesity    Personal history of chemotherapy    2022 bilat lumpectomies w/rad w/chemo   Personal history of radiation therapy    2022 bilat lumpectomies w/rad w/chemo   Prediabetes    Sleep apnea    does not use CPAP   Vitamin D deficiency   heart murmur (since childhood)   PAST SURGICAL HISTORY: Past Surgical History:  Procedure Laterality Date   ABDOMINAL HYSTERECTOMY     BREAST LUMPECTOMY Bilateral    2022 bilat lumpectomies w/rad w/chemo   BREAST LUMPECTOMY WITH RADIOACTIVE SEED AND SENTINEL LYMPH NODE BIOPSY Left 04/16/2020   Procedure: LEFT BREAST LUMPECTOMY WITH RADIOACTIVE SEED AND LEFT AXILLARY SENTINEL LYMPH NODE BIOPSY;  Surgeon: Cheyenne Miyamoto, MD;  Location: [facility];  Service: General;  Laterality: Left;   BREAST LUMPECTOMY WITH RADIOACTIVE SEED LOCALIZATION Right 04/16/2020   Procedure: RIGHT BREAST LUMPECTOMY WITH  RADIOACTIVE SEED LOCALIZATION;  Surgeon: Cheyenne Miyamoto, MD;  Location: [facility];  Service: General;  Laterality: Right;   IR IMAGING GUIDED PORT INSERTION  10/18/2019   PORT-A-CATH REMOVAL Right 09/02/2020   Procedure: REMOVAL PORT-A-CATH;  Surgeon: Cheyenne Miyamoto, MD;  Location: [facility];  Service: General;  Laterality: Right;    FAMILY HISTORY: Family History  Problem Relation Age of Onset   High Cholesterol Mother    Breast cancer Cousin 70       maternal cousin; bilateral    Her father died at age 2, cause of death unknown. Her mother is age 22 as of 09/2019. Cheyenne Mcdonald has two brothers (and no sisters). She reports one cousin with breast cancer at age 61.   GYNECOLOGIC HISTORY:  No LMP recorded. Patient has had a hysterectomy. Menarche: 71 years old Age at first live birth: 71 years old GX P 2 LMP 1990 Contraceptive: used for maybe 20 years HRT: never used  Hysterectomy? Yes, 1990 BSO? 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 Husband Cheyenne Mcdonald is retired Hotel manager and then retired Therapist, occupational.  At home is just the 2 of them. Daughter Cheyenne Mcdonald, age 20, works in data entry in [employer]. Son Cheyenne Mcdonald, age 96, has a college degree but works for [employer] in [employer]. Cheyenne Mcdonald has four grandchildren. She attends [employer] [address]    ADVANCED DIRECTIVES: In place   HEALTH MAINTENANCE: Social History   Tobacco Use   Smoking status: Never   Smokeless tobacco: Never  Substance Use Topics   Alcohol use: Never   Drug use: Never     Colonoscopy: 2018 (Dr. Loreta Mcdonald)  PAP: approx. 2013  Bone density: 2016, "normal"   Allergies  Allergen Reactions   Shellfish Allergy Itching    Current Outpatient Medications  Medication Sig Dispense Refill   Cholecalciferol (VITAMIN D-3) 125 MCG (5000 UT) TABS 1 cap(s)     cyanocobalamin 500 MCG TABS 300-540mcg qd     tamoxifen (NOLVADEX) 20  MG tablet Take 20 mg by mouth daily.     No current facility-administered medications for this visit.    OBJECTIVE: African American woman who appears younger than stated age  Vitals:   08/31/21 1444  BP: 137/71  Pulse: 65  Resp: 16  Temp: (!) 97.5 F (36.4 C)  SpO2: 99%      Body mass index is 38.71 kg/m.   Wt Readings from Last 3 Encounters:  08/31/21 218 lb 8 oz (99.1 kg)  08/26/21 218 lb (98.9 kg)  08/10/21 216 lb (98 kg)     ECOG FS:1 - Symptomatic but completely ambulatory  Physical Exam Constitutional:      General: She is not in acute distress.    Appearance: Normal appearance.  HENT:     Head: Normocephalic and atraumatic.  Chest:     Comments: Bilateral breast status post lumpectomies.  Postsurgical changes, otherwise no palpable masses or regional adenopathy. Musculoskeletal:     Cervical back: Normal range of motion and neck supple. No rigidity.  Skin:    General: Skin is warm and dry.  Neurological:     Mental Status: She is alert.       LAB RESULTS:  CMP  Component Value Date/Time   NA 140 07/27/2021 1021   K 4.1 07/27/2021 1021   CL 102 07/27/2021 1021   CO2 22 07/27/2021 1021   GLUCOSE 88 07/27/2021 1021   GLUCOSE 101 (H) 03/03/2020 0839   BUN 15 07/27/2021 1021   CREATININE 1.04 (H) 07/27/2021 1021   CREATININE 1.05 (H) 03/03/2020 0839   CALCIUM 9.3 07/27/2021 1021   PROT 6.4 07/27/2021 1021   ALBUMIN 4.3 07/27/2021 1021   AST 20 07/27/2021 1021   AST 13 (L) 03/03/2020 0839   ALT 14 07/27/2021 1021   ALT 11 03/03/2020 0839   ALKPHOS 53 07/27/2021 1021   BILITOT 0.3 07/27/2021 1021   BILITOT 0.4 03/03/2020 0839   GFRNONAA 57 (L) 03/03/2020 0839   GFRAA 58 (L) 10/09/2019 0806    No results found for: "TOTALPROTELP", "ALBUMINELP", "A1GS", "A2GS", "BETS", "BETA2SER", "GAMS", "MSPIKE", "SPEI"  Lab Results  Component Value Date   WBC 3.6 (L) 08/31/2021   NEUTROABS 1.8 08/31/2021   HGB 13.2 08/31/2021   HCT 37.5 08/31/2021    MCV 89.9 08/31/2021   PLT 150 08/31/2021    No results found for: "LABCA2"  No components found for: "YCXKGY185"  No results for input(s): "INR" in the last 168 hours.  No results found for: "LABCA2"  No results found for: "UDJ497"  No results found for: "CAN125"  No results found for: "CAN153"  No results found for: "CA2729"  No components found for: "HGQUANT"  No results found for: "CEA1", "CEA" / No results found for: "CEA1", "CEA"   No results found for: "AFPTUMOR"  No results found for: "CHROMOGRNA"  No results found for: "KPAFRELGTCHN", "LAMBDASER", "KAPLAMBRATIO" (kappa/lambda light chains)  No results found for: "HGBA", "HGBA2QUANT", "HGBFQUANT", "HGBSQUAN" (Hemoglobinopathy evaluation)   No results found for: "LDH"  No results found for: "IRON", "TIBC", "IRONPCTSAT" (Iron and TIBC)  No results found for: "FERRITIN"  Urinalysis No results found for: "COLORURINE", "APPEARANCEUR", "LABSPEC", "PHURINE", "GLUCOSEU", "HGBUR", "BILIRUBINUR", "KETONESUR", "PROTEINUR", "UROBILINOGEN", "NITRITE", "LEUKOCYTESUR"   STUDIES: No results found.   ELIGIBLE FOR AVAILABLE RESEARCH PROTOCOL: AET  ASSESSMENT: 71 y.o. [place] woman status post left breast upper outer quadrant biopsy 09/27/2019 for a clinically T1-T2 N0, stage IA- IIA invasive ductal carcinoma, grade 3, triple negative, with an MIB-1 of 90%.  (1) genetics testing 10/30/2019 through the [facility] Common Hereditary Cancers Panel found no deleterious mutations in APC, ATM, AXIN2, BARD1, BMPR1A, BRCA1, BRCA2, BRIP1, CDH1, CDK4, CDKN2A (p14ARF), CDKN2A (p16INK4a), CHEK2, CTNNA1, DICER1, EPCAM (Deletion/duplication testing only), GREM1 (promoter region deletion/duplication testing only), KIT, MEN1, MLH1, MSH2, MSH3, MSH6, MUTYH, NBN, NF1, NHTL1, PALB2, PDGFRA, PMS2, POLD1, POLE, PTEN, RAD50, RAD51C, RAD51D, RNF43, SDHB, SDHC, SDHD, SMAD4, SMARCA4. STK11, TP53, TSC1, TSC2, and VHL.  The following genes were  evaluated for sequence changes only: SDHA and HOXB13 c.251G>A variant only.  LEFT BREAST: (2) neoadjuvant chemotherapy consisting of doxorubicin and cyclophosphamide in dose dense fashion x4 starting 10/22/2019, completed 12/03/2019,followed by weekly carboplatin and paclitaxel x12 started 12/17/2019, last dose 02/25/2020  (a) echo 10/17/2019 shows an ejection fraction in the 60-65% range  (b) final 2 doses of carboplatinum/paclitaxel omitted with grade 1 neuropathy  (3) status post left lumpectomy and sentinel lymph node sampling 04/16/2020 for a ypT0 ypN0, complete pathologic response  (a) a total of 3 left axillary lymph nodes were removed  RIGHT BREAST: (4) multicentric biopsy x2 on the right (contralateral) breast 02/27/2020 shows ductal carcinoma in situ, estrogen and progesterone receptor strongly positive  (5) right lumpectomy 04/16/2020 shows  a residual 1.4 cm of ductal carcinoma in situ, grade 1, with close but negative margins  (6) adjuvant radiation to both breasts 06/01/2020 through 07/20/2020 Site Technique Total Dose (Gy) Dose per Fx (Gy) Completed Fx Beam Energies  Breast, Left: Breast_Lt 3D 50.4/50.4 1.8 28/28 10X  Breast, Left: Breast_Lt_Bst 3D 10/10 2 5/5 6X, 10X  Breast, Right: Breast_Rt 3D 50.4/50.4 1.8 28/28 10X  Breast, Right: Breast_Rt_Bst 3D 12/12 2 6/6 6X, 10X   (7) tamoxifen started 09/02/2020   PLAN:  Ms. Hauter is here for follow-up on tamoxifen.  She is tolerating this very well without any major side effects. Physical examination today with no concerns for recurrence, bilateral breast status postsurgical changes. She is due for mammogram in September 20, 2021 She was encouraged to contact us with any new questions or concerns.   She otherwise will continue with exercise, now is part of the weight loss clinic and she is very motivated to lose weight.  Total time spent: 30 minutes  *Total Encounter Time as defined by the Centers for Medicare and Medicaid  Services includes, in addition to the face-to-face time of a patient visit (documented in the note above) non-face-to-face time: obtaining and reviewing outside history, ordering and reviewing medications, tests or procedures, care coordination (communications with other health care professionals or caregivers) and documentation in the medical record.

## 2021-09-02 NOTE — Progress Notes (Signed)
Chief Complaint:   OBESITY Cheyenne Mcdonald is here to discuss her progress with her obesity treatment plan along with follow-up of her obesity related diagnoses. Cheyenne Mcdonald is on the Category 1 Plan and states she is following her eating plan approximately 25% of the time. Cheyenne Mcdonald states she is doing 0 minutes 0 times per week.  Today's visit was #: 3 Starting weight: 220 lbs Starting date: 07/27/2021 Today's weight: 218 lbs Today's date: 08/26/2021 Total lbs lost to date: 2 Total lbs lost since last in-office visit: 0  Interim History:  Cheyenne Mcdonald's birthday was [birth year], and tomorrow she will be celebrating 10 years of marriage.   Subjective:   1. Elevated serum creatinine Cheyenne Mcdonald is not on any anti-hypertensive therapy.  She will be following up with Nephrology in a few weeks.  2. History of gout Per the patient, her last gout flare up was >1 year ago.  She subsequently stopped taking daily Allopurinol 100 mg.   Assessment/Plan:   1. Elevated serum creatinine Cheyenne Mcdonald will follow-up with Nephrology at her next scheduled appointment.   2. History of gout Cheyenne Mcdonald will continue to follow her low purine diet and increase her water intake.  Discontinue Allopurinol on MAR.   3. Obesity, current BMI 38.7 Cheyenne Mcdonald is currently in the action stage of change. As such, her goal is to continue with weight loss efforts. She has agreed to the Category 1 Plan.   Exercise goals: Water aerobics and increase daily walking.  Behavioral modification strategies: increasing lean protein intake, decreasing simple carbohydrates, increasing water intake, meal planning and cooking strategies, keeping healthy foods in the home, and planning for success.  Cheyenne Mcdonald has agreed to follow-up with our clinic in 2 weeks. She was informed of the importance of frequent follow-up visits to maximize her success with intensive lifestyle modifications for her multiple health conditions.   Objective:   Blood pressure 124/81, pulse 77,  temperature 98.5 F (36.9 C), height '5\' 3"'$  (1.6 m), weight 218 lb (98.9 kg), SpO2 99 %. Body mass index is 38.62 kg/m.  General: Cooperative, alert, well developed, in no acute distress. HEENT: Conjunctivae and lids unremarkable. Cardiovascular: Regular rhythm.  Lungs: Normal work of breathing. Neurologic: No focal deficits.   Lab Results  Component Value Date   CREATININE 1.17 (H) 08/31/2021   BUN 19 08/31/2021   NA 139 08/31/2021   K 4.0 08/31/2021   CL 108 08/31/2021   CO2 26 08/31/2021   Lab Results  Component Value Date   ALT 14 08/31/2021   AST 15 08/31/2021   ALKPHOS 43 08/31/2021   BILITOT 0.4 08/31/2021   Lab Results  Component Value Date   HGBA1C 5.9 (H) 07/27/2021   Lab Results  Component Value Date   INSULIN 16.9 07/27/2021   Lab Results  Component Value Date   TSH 2.200 07/27/2021   Lab Results  Component Value Date   CHOL 152 07/27/2021   HDL 53 07/27/2021   LDLCALC 83 07/27/2021   TRIG 86 07/27/2021   Lab Results  Component Value Date   VD25OH 55.4 07/27/2021   Lab Results  Component Value Date   WBC 3.6 (L) 08/31/2021   HGB 13.2 08/31/2021   HCT 37.5 08/31/2021   MCV 89.9 08/31/2021   PLT 150 08/31/2021   No results found for: "IRON", "TIBC", "FERRITIN"  Attestation Statements:   Reviewed by clinician on day of visit: allergies, medications, problem list, medical history, surgical history, family history, social history, and previous encounter  notes.  Time spent on visit including pre-visit chart review and post-visit care and charting was 27 minutes.   Wilhemena Durie, am acting as transcriptionist for Mina Marble, NP.  I have reviewed the above documentation for accuracy and completeness, and I agree with the above. -  Rickard Kennerly d. Che[facility] Pucci, NP-C

## 2021-09-06 ENCOUNTER — Telehealth (INDEPENDENT_AMBULATORY_CARE_PROVIDER_SITE_OTHER): Payer: Medicare HMO | Admitting: Psychology

## 2021-09-06 DIAGNOSIS — F5089 Other specified eating disorder: Secondary | ICD-10-CM | POA: Diagnosis not present

## 2021-09-06 NOTE — Progress Notes (Signed)
  Office: [phone]  /  Fax: [phone]    Date: September 06, 2021    Appointment Start Time: 8:01am Duration: 27 minutes Provider: Glennie Isle, Psy.D. Type of Session: Individual Therapy  Location of Patient: Home (private location) Location of Provider: Provider's Home (private office) Type of Contact: Telepsychological Visit via MyChart Video Visit  Session Content: Cheyenne Mcdonald is a 71 y.o. female presenting for a follow-up appointment to address the previously established treatment goal of increasing coping skills.Today's appointment was a telepsychological visit. Cheyenne Mcdonald provided verbal consent for today's telepsychological appointment and she is aware she is responsible for securing confidentiality on her end of the session. Prior to proceeding with today's appointment, Cheyenne Mcdonald's physical location at the time of this appointment was obtained as well a phone number she could be reached at in the event of technical difficulties. Cheyenne Mcdonald and this provider participated in today's telepsychological service.   This provider conducted a brief check-in. Cheyenne Mcdonald stated, "I think I was doing pretty good well for a while [referring to her eating habits]." She further discussed an increase in physical activity and awareness in physical vs. emotional hunger. Additionally, Cheyenne Mcdonald discussed implementation of various strategies to help her make better choices and engage in portion control. Positive reinforcement was provided. Cheyenne Mcdonald expressed challenges with eating her dinner protein portion. Thus, she was engaged in problem solving (e.g., meal planning, eating a few ounces earlier in the day, moving around foods on the structured meal plan; consistently weighing meat). Psychoeducation regarding triggers for emotional eating was provided. Cheyenne Mcdonald was provided a handout, and encouraged to utilize the handout between now and the next appointment to increase awareness of triggers and frequency. Cheyenne Mcdonald agreed. This provider also  discussed behavioral strategies for specific triggers, such as placing the utensil down when conversing to avoid mindless eating. Cheyenne Mcdonald provided verbal consent during today's appointment for this provider to send a handout about triggers via e-mail. Overall, Cheyenne Mcdonald was receptive to today's appointment as evidenced by openness to sharing, responsiveness to feedback, and willingness to explore triggers for emotional eating.  Mental Status Examination:  Appearance: neat Behavior: appropriate to circumstances Mood: neutral Affect: mood congruent Speech: WNL Eye Contact: appropriate Psychomotor Activity: WNL Gait: unable to assess Thought Process: linear, logical, and goal directed and no evidence or endorsement of suicidal, homicidal, and self-harm ideation, plan and intent  Thought Content/Perception: no hallucinations, delusions, bizarre thinking or behavior endorsed or observed Orientation: AAOx4 Memory/Concentration: memory, attention, language, and fund of knowledge intact  Insight: fair Judgment: fair  Interventions:  Conducted a brief chart review Provided empathic reflections and validation Provided positive reinforcement Employed supportive psychotherapy interventions to facilitate reduced distress and to improve coping skills with identified stressors Psychoeducation provided regarding triggers for emotional eating behaviors  DSM-5 Diagnosis(es): F50.89 Other Specified Feeding or Eating Disorder, Emotional Eating Behaviors  Treatment Goal & Progress: During the initial appointment with this provider, the following treatment goal was established: increase coping skills. Cheyenne Mcdonald has demonstrated progress in her goal as evidenced by increased awareness of hunger patterns. Cheyenne Mcdonald also continues to demonstrate willingness to engage in learned skill(s).  Plan: The next appointment is scheduled for 09/20/2021 at 11am, which will be via MyChart Video Visit. The next session will focus on reviewing  triggers for emotional eating behaviors and working towards the established treatment goal.

## 2021-09-07 ENCOUNTER — Ambulatory Visit (INDEPENDENT_AMBULATORY_CARE_PROVIDER_SITE_OTHER): Payer: Medicare HMO | Admitting: Family Medicine

## 2021-09-07 ENCOUNTER — Encounter (INDEPENDENT_AMBULATORY_CARE_PROVIDER_SITE_OTHER): Payer: Self-pay | Admitting: Family Medicine

## 2021-09-07 VITALS — BP 137/85 | HR 68 | Temp 97.9°F | Ht 63.0 in | Wt 217.0 lb

## 2021-09-07 DIAGNOSIS — Z6838 Body mass index (BMI) 38.0-38.9, adult: Secondary | ICD-10-CM

## 2021-09-07 DIAGNOSIS — E538 Deficiency of other specified B group vitamins: Secondary | ICD-10-CM

## 2021-09-07 DIAGNOSIS — E559 Vitamin D deficiency, unspecified: Secondary | ICD-10-CM | POA: Diagnosis not present

## 2021-09-07 DIAGNOSIS — E669 Obesity, unspecified: Secondary | ICD-10-CM

## 2021-09-14 ENCOUNTER — Other Ambulatory Visit: Payer: Self-pay | Admitting: *Deleted

## 2021-09-14 MED ORDER — TAMOXIFEN CITRATE 20 MG PO TABS: 20.0000 mg | ORAL_TABLET | Freq: Every day | ORAL | 3 refills | Status: DC

## 2021-09-15 NOTE — Progress Notes (Signed)
Chief Complaint:   OBESITY Cheyenne Mcdonald is here to discuss her progress with her obesity treatment plan along with follow-up of her obesity related diagnoses. Cheyenne Mcdonald is on the Category 1 Plan with lunch options and states she is following her eating plan approximately 65-70% of the time. Cheyenne Mcdonald states she is not currently exercising.  Today's visit was #: 4 Starting weight: 220 lbs Starting date: 07/27/2021 Today's weight: 217 lbs Today's date: 09/07/2021 Total lbs lost to date: 3 Total lbs lost since last in-office visit: 1  Interim History: Cheyenne Mcdonald is having small successes and making healthier choices. She loves working with Dr. Mallie Mussel, and is learning to make better emotional choices. Her concerns are in not eating everything and skipping meals. Pt gained 7 lbs of muscle mass and lost 8.2 lbs of fat mass.  Subjective:   1. Vitamin D deficiency She is currently taking OTC vitamin D 5000 IU each day. She denies nausea, vomiting or muscle weakness.  2. B12 deficiency Pt is taking OTC B12 500 mcg daily, and it is too early to tell if her energy has improved.  Assessment/Plan:  No orders of the defined types were placed in this encounter.   There are no discontinued medications.   No orders of the defined types were placed in this encounter.    1. Vitamin D deficiency Low Vitamin D level contributes to fatigue and are associated with obesity, breast, and colon cancer. She agrees to continue to take prescription Vitamin D 5,000 IU daily and will follow-up for routine testing of Vitamin D, at least 2-3 times per year to avoid over-replacement.  2. B12 deficiency The diagnosis was reviewed with the patient. Counseling provided today, see below. We will continue to monitor. Orders and follow up as documented in patient record. Continue OTC B12 500 mcg daily.  Counseling The body needs vitamin B12: to make red blood cells; to make DNA; and to help the nerves work properly so they can carry  messages from the brain to the body.  The main causes of vitamin B12 deficiency include dietary deficiency, digestive diseases, pernicious anemia, and having a surgery in which part of the stomach or small intestine is removed.  Certain medicines can make it harder for the body to absorb vitamin B12. These medicines include: heartburn medications; some antibiotics; some medications used to treat diabetes, gout, and high cholesterol.  In some cases, there are no symptoms of this condition. If the condition leads to anemia or nerve damage, various symptoms can occur, such as weakness or fatigue, shortness of breath, and numbness or tingling in your hands and feet.   Treatment:  May include taking vitamin B12 supplements.  Avoid alcohol.  Eat lots of healthy foods that contain vitamin B12: Beef, pork, chicken, [place], and organ meats, such as liver.  Seafood: This includes clams, rainbow trout, salmon, tuna, and haddock. Eggs.  Cereal and dairy products that are fortified: This means that vitamin B12 has been added to the food.   3. Obesity, current BMI 38.4 Cheyenne Mcdonald is currently in the action stage of change. As such, her goal is to continue with weight loss efforts. She has agreed to the Category 1 Plan with lunch options and journal intake.   Start journaling intake.  Exercise goals:  As is  Behavioral modification strategies: increasing lean protein intake and keeping a strict food journal.  Lorianne has agreed to follow-up with our clinic in 2-3 weeks. She was informed of the importance of  frequent follow-up visits to maximize her success with intensive lifestyle modifications for her multiple health conditions.   Objective:   Blood pressure 137/85, pulse 68, temperature 97.9 F (36.6 C), height '5\' 3"'$  (1.6 m), weight 217 lb (98.4 kg), SpO2 98 %. Body mass index is 38.44 kg/m.  General: Cooperative, alert, well developed, in no acute distress. HEENT: Conjunctivae and lids  unremarkable. Cardiovascular: Regular rhythm.  Lungs: Normal work of breathing. Neurologic: No focal deficits.   Lab Results  Component Value Date   CREATININE 1.17 (H) 08/31/2021   BUN 19 08/31/2021   NA 139 08/31/2021   K 4.0 08/31/2021   CL 108 08/31/2021   CO2 26 08/31/2021   Lab Results  Component Value Date   ALT 14 08/31/2021   AST 15 08/31/2021   ALKPHOS 43 08/31/2021   BILITOT 0.4 08/31/2021   Lab Results  Component Value Date   HGBA1C 5.9 (H) 07/27/2021   Lab Results  Component Value Date   INSULIN 16.9 07/27/2021   Lab Results  Component Value Date   TSH 2.200 07/27/2021   Lab Results  Component Value Date   CHOL 152 07/27/2021   HDL 53 07/27/2021   LDLCALC 83 07/27/2021   TRIG 86 07/27/2021   Lab Results  Component Value Date   VD25OH 55.4 07/27/2021   Lab Results  Component Value Date   WBC 3.6 (L) 08/31/2021   HGB 13.2 08/31/2021   HCT 37.5 08/31/2021   MCV 89.9 08/31/2021   PLT 150 08/31/2021     Attestation Statements:   Reviewed by clinician on day of visit: allergies, medications, problem list, medical history, surgical history, family history, social history, and previous encounter notes.  Time spent on visit including pre-visit chart review and post-visit care and charting was 30 minutes.   I, Kathlene November, BS, CMA, am acting as transcriptionist for [employer], DO.  I I have reviewed the above documentation for accuracy and completeness, and I agree with the above. Marjory Sneddon, D.O.  The [place] was signed into law in 2016 which includes the topic of electronic health records.  This provides immediate access to information in MyChart.  This includes consultation notes, operative notes, office notes, lab results and pathology reports.  If you have any questions about what you read please let us know at your next visit so we can discuss your concerns and take corrective action if need be.  We are right  here with you.

## 2021-09-20 ENCOUNTER — Ambulatory Visit
Admission: RE | Admit: 2021-09-20 | Discharge: 2021-09-20 | Disposition: A | Discharge status: Home / Self Care | Payer: Medicare HMO | Source: Ambulatory Visit | Attending: Hematology and Oncology | Admitting: Hematology and Oncology

## 2021-09-20 ENCOUNTER — Telehealth (INDEPENDENT_AMBULATORY_CARE_PROVIDER_SITE_OTHER): Payer: Medicare HMO | Admitting: Psychology

## 2021-09-20 DIAGNOSIS — F5089 Other specified eating disorder: Secondary | ICD-10-CM | POA: Diagnosis not present

## 2021-09-20 DIAGNOSIS — R928 Other abnormal and inconclusive findings on diagnostic imaging of breast: Secondary | ICD-10-CM | POA: Diagnosis not present

## 2021-09-20 DIAGNOSIS — Z853 Personal history of malignant neoplasm of breast: Secondary | ICD-10-CM

## 2021-09-20 NOTE — Progress Notes (Signed)
  Office: [phone]  /  Fax: [phone]    Date: September 20, 2021    Appointment Start Time: 11:02am Duration: 27 minutes Provider: Glennie Isle, Psy.D. Type of Session: Individual Therapy  Location of Patient: Home (private location) Location of Provider: Provider's Home (private office) Type of Contact: Telepsychological Visit via MyChart Video Visit  Session Content: Cheyenne Mcdonald is a 71 y.o. female presenting for a follow-up appointment to address the previously established treatment goal of increasing coping skills.Today's appointment was a telepsychological visit. Cheyenne Mcdonald provided verbal consent for today's telepsychological appointment and she is aware she is responsible for securing confidentiality on her end of the session. Prior to proceeding with today's appointment, Cheyenne Mcdonald's physical location at the time of this appointment was obtained as well a phone number she could be reached at in the event of technical difficulties. Cheyenne Mcdonald and this provider participated in today's telepsychological service.   This provider conducted a brief check-in. Cheyenne Mcdonald acknowledged challenges eating all the recommended protein on her structured meal plan and discussed a few instances of emotional eating behaviors. Reviewed triggers for emotional eating behaviors and engaged Cheyenne Mcdonald in problem solving to help her eat all recommended protein (e.g., having a working plan for meals/snacks, doubling recipes, hard boiling eggs ahead of time). Psychoeducation regarding the hunger and satisfaction scale was also provided. [name] provided verbal consent during today's appointment for this provider to send a handout with the scale via e-mail. Overall, Cheyenne Mcdonald was receptive to today's appointment as evidenced by openness to sharing, responsiveness to feedback, and willingness to implement discussed strategies .  Mental Status Examination:  Appearance: neat Behavior: appropriate to circumstances Mood: neutral Affect: mood  congruent Speech: WNL Eye Contact: appropriate Psychomotor Activity: WNL Gait: unable to assess Thought Process: linear, logical, and goal directed and no evidence or endorsement of suicidal, homicidal, and self-harm ideation, plan and intent  Thought Content/Perception: no hallucinations, delusions, bizarre thinking or behavior endorsed or observed Orientation: AAOx4 Memory/Concentration: memory, attention, language, and fund of knowledge intact  Insight: fair Judgment: fair  Interventions:  Conducted a brief chart review Provided empathic reflections and validation Reviewed content from the previous session Provided positive reinforcement Employed supportive psychotherapy interventions to facilitate reduced distress and to improve coping skills with identified stressors Engaged patient in problem solving Psychoeducation provided regarding the hunger and satisfaction scale  DSM-5 Diagnosis(es): F50.89 Other Specified Feeding or Eating Disorder, Emotional Eating Behaviors  Treatment Goal & Progress: During the initial appointment with this provider, the following treatment goal was established: increase coping skills. Cheyenne Mcdonald has demonstrated progress in her goal as evidenced by increased awareness of hunger patterns and increased awareness of triggers for emotional eating behaviors. [name] also continues to demonstrate willingness to engage in learned skill(s).  Plan: The next appointment is scheduled for 10/05/2021 at 11am, which will be via MyChart Video Visit. The next session will focus on working towards the established treatment goal.

## 2021-09-28 ENCOUNTER — Ambulatory Visit (INDEPENDENT_AMBULATORY_CARE_PROVIDER_SITE_OTHER): Payer: Medicare HMO | Admitting: Adult Health

## 2021-09-28 ENCOUNTER — Encounter (INDEPENDENT_AMBULATORY_CARE_PROVIDER_SITE_OTHER): Payer: Self-pay | Admitting: Adult Health

## 2021-09-28 VITALS — BP 133/83 | HR 74 | Temp 98.2°F | Ht 63.0 in | Wt 216.0 lb

## 2021-09-28 DIAGNOSIS — Z6838 Body mass index (BMI) 38.0-38.9, adult: Secondary | ICD-10-CM | POA: Diagnosis not present

## 2021-09-28 DIAGNOSIS — E669 Obesity, unspecified: Secondary | ICD-10-CM

## 2021-09-28 DIAGNOSIS — R7303 Prediabetes: Secondary | ICD-10-CM | POA: Diagnosis not present

## 2021-09-29 NOTE — Progress Notes (Unsigned)
Chief Complaint:   OBESITY Cheyenne Mcdonald is here to discuss her progress with her obesity treatment plan along with follow-up of her obesity related diagnoses. Cheyenne Mcdonald is on the Category 1 Plan and states she is following her eating plan approximately 75% of the time. Cheyenne Mcdonald states she is water aerobics and walking 45 minutes 4 times per week.  Today's visit was #: 5 Starting weight: 220 lbs Starting date: 07/27/2021 Today's weight: 216 lbs Today's date: 09/28/2021 Total lbs lost to date: 4 lbs Total lbs lost since last in-office visit: 1 lb  Interim History: she has been using a food scale to measure meat protein at lunch and dinner.  Breakfast:  cereal, (high protein) or greek yogurt or cheese toast Lunch:  fruit Dinner:  started vegetable with meat protein.  Subjective:   1. Prediabetes A1c ***  Assessment/Plan:   1. Prediabetes Increase protein, limit carbohydrates and sugar.   2. Obesity, current BMI 38.3 Handout:  100 calorie snack sheet.   Cheyenne Mcdonald is currently in the action stage of change. As such, her goal is to continue with weight loss efforts. She has agreed to the Category 1 Plan.   Exercise goals:  As is.   Behavioral modification strategies: increasing lean protein intake, decreasing simple carbohydrates, increasing water intake, meal planning and cooking strategies, keeping healthy foods in the home, and planning for success.  Cheyenne Mcdonald has agreed to follow-up with our clinic in 2 weeks. She was informed of the importance of frequent follow-up visits to maximize her success with intensive lifestyle modifications for her multiple health conditions.   Objective:   Blood pressure 133/83, pulse 74, temperature 98.2 F (36.8 C), height '5\' 3"'$  (1.6 m), weight 216 lb (98 kg), SpO2 98 %. Body mass index is 38.26 kg/m.  General: Cooperative, alert, well developed, in no acute distress. HEENT: Conjunctivae and lids unremarkable. Cardiovascular: Regular rhythm.  Lungs: Normal  work of breathing. Neurologic: No focal deficits.   Lab Results  Component Value Date   CREATININE 1.17 (H) 08/31/2021   BUN 19 08/31/2021   NA 139 08/31/2021   K 4.0 08/31/2021   CL 108 08/31/2021   CO2 26 08/31/2021   Lab Results  Component Value Date   ALT 14 08/31/2021   AST 15 08/31/2021   ALKPHOS 43 08/31/2021   BILITOT 0.4 08/31/2021   Lab Results  Component Value Date   HGBA1C 5.9 (H) 07/27/2021   Lab Results  Component Value Date   INSULIN 16.9 07/27/2021   Lab Results  Component Value Date   TSH 2.200 07/27/2021   Lab Results  Component Value Date   CHOL 152 07/27/2021   HDL 53 07/27/2021   LDLCALC 83 07/27/2021   TRIG 86 07/27/2021   Lab Results  Component Value Date   VD25OH 55.4 07/27/2021   Lab Results  Component Value Date   WBC 3.6 (L) 08/31/2021   HGB 13.2 08/31/2021   HCT 37.5 08/31/2021   MCV 89.9 08/31/2021   PLT 150 08/31/2021   No results found for: "IRON", "TIBC", "FERRITIN"  Obesity Behavioral Intervention:   Approximately 15 minutes were spent on the discussion below.  ASK: We discussed the diagnosis of obesity with Cheyenne Mcdonald today and Cheyenne Mcdonald agreed to give [place] permission to discuss obesity behavioral modification therapy today.  ASSESS: Cheyenne Mcdonald has the diagnosis of obesity and her BMI today is 38.3. Cheyenne Mcdonald is in the action stage of change.   ADVISE: Cheyenne Mcdonald was educated on the multiple health risks  of obesity as well as the benefit of weight loss to improve her health. She was advised of the need for long term treatment and the importance of lifestyle modifications to improve her current health and to decrease her risk of future health problems.  AGREE: Multiple dietary modification options and treatment options were discussed and Cheyenne Mcdonald agreed to follow the recommendations documented in the above note.  ARRANGE: Cheyenne Mcdonald was educated on the importance of frequent visits to treat obesity as outlined per CMS and USPSTF guidelines and agreed to  schedule her next follow up appointment today.  Attestation Statements:   Reviewed by clinician on day of visit: allergies, medications, problem list, medical history, surgical history, family history, social history, and previous encounter notes.  I, Davy Pique, RMA, am acting as Location manager for Mina Marble, NP.   I have reviewed the above documentation for accuracy and completeness, and I agree with the above. -  ***

## 2021-09-30 DIAGNOSIS — N952 Postmenopausal atrophic vaginitis: Secondary | ICD-10-CM | POA: Diagnosis not present

## 2021-09-30 DIAGNOSIS — Z79810 Long term (current) use of selective estrogen receptor modulators (SERMs): Secondary | ICD-10-CM | POA: Diagnosis not present

## 2021-09-30 DIAGNOSIS — Z853 Personal history of malignant neoplasm of breast: Secondary | ICD-10-CM | POA: Diagnosis not present

## 2021-09-30 DIAGNOSIS — Z90710 Acquired absence of both cervix and uterus: Secondary | ICD-10-CM | POA: Diagnosis not present

## 2021-09-30 DIAGNOSIS — Z01419 Encounter for gynecological examination (general) (routine) without abnormal findings: Secondary | ICD-10-CM | POA: Diagnosis not present

## 2021-10-05 ENCOUNTER — Telehealth (INDEPENDENT_AMBULATORY_CARE_PROVIDER_SITE_OTHER): Payer: Medicare HMO | Admitting: Psychology

## 2021-10-05 DIAGNOSIS — F5089 Other specified eating disorder: Secondary | ICD-10-CM

## 2021-10-05 NOTE — Progress Notes (Signed)
  Office: [phone]  /  Fax: [phone]    Date: October 05, 2021    Appointment Start Time: 11:08am Duration: 22 minutes Provider: Glennie Isle, Psy.D. Type of Session: Individual Therapy  Location of Patient: Parked in car (safe/private location; address obtained) Location of Provider: Provider's Home (private office) Type of Contact: Telepsychological Visit via MyChart Video Visit  Session Content: This provider called Cheyenne Mcdonald at 11:02am as she did not present for today's appointment. She indicated she was driving home from the pool. This provider explained she would either have to pull over for the appointment due to safety reasons or reschedule. She stated she would pull over. As such, today's appointment was initiated 8 minutes late. Cheyenne Mcdonald is a 70 y.o. female presenting for a follow-up appointment to address the previously established treatment goal of increasing coping skills.Today's appointment was a telepsychological visit. Cheyenne Mcdonald provided verbal consent for today's telepsychological appointment and she is aware she is responsible for securing confidentiality on her end of the session. Prior to proceeding with today's appointment, Cheyenne Mcdonald's physical location at the time of this appointment was obtained as well a phone number she could be reached at in the event of technical difficulties. Cheyenne Mcdonald and this provider participated in today's telepsychological service. Of note, today's appointment was switched to a regular telephone call at 11:11am with Cheyenne Mcdonald's verbal consent due to technical issues.   This provider conducted a brief check-in. Cheyenne Mcdonald stated there are days she is "spot on" her structured meal plan and some days where she "lose[s] it." Further explored and processed. Notably, she described engaging in learned strategies. Cheyenne Mcdonald identified when her routine is interrupted, she experiences challenges eating congruent to her structured meal plan. Reviewed strategies from the last appointment.  Cheyenne Mcdonald was observed engaging in negative self-talk, which was reflected to her. As such, psychoeducation was provided regarding self-compassion and she was engaged in an exercise (how would you respond to a child if they told you they made a mistake or failed). Overall, Cheyenne Mcdonald was receptive to today's appointment as evidenced by openness to sharing, responsiveness to feedback, and willingness to work toward increasing self-compassion.  Mental Status Examination:  Appearance: neat Behavior: appropriate to circumstances Mood: neutral Affect: mood congruent Speech: WNL Eye Contact: appropriate Psychomotor Activity: WNL Gait: unable to assess Thought Process: linear, logical, and goal directed and no evidence or endorsement of suicidal, homicidal, and self-harm ideation, plan and intent  Thought Content/Perception: no hallucinations, delusions, bizarre thinking or behavior endorsed or observed Orientation: AAOx4 Memory/Concentration: memory, attention, language, and fund of knowledge intact  Insight: fair Judgment: fair  Interventions:  Conducted a brief chart review Provided empathic reflections and validation Reviewed content from the previous session Provided positive reinforcement Employed supportive psychotherapy interventions to facilitate reduced distress and to improve coping skills with identified stressors Psychoeducation provided regarding self-compassion Engaged pt in a self-compassion exercise  DSM-5 Diagnosis(es): F50.89 Other Specified Feeding or Eating Disorder, Emotional Eating Behaviors  Treatment Goal & Progress: During the initial appointment with this provider, the following treatment goal was established: increase coping skills. Cheyenne Mcdonald has demonstrated progress in her goal as evidenced by increased awareness of hunger patterns and increased awareness of triggers for emotional eating behaviors. Cheyenne Mcdonald also continues to demonstrate willingness to engage in learned  skill(s).  Plan: The next appointment is scheduled for 10/19/2021 at 11am, which will be via MyChart Video Visit. The next session will focus on working towards the established treatment goal.

## 2021-10-07 DIAGNOSIS — I10 Essential (primary) hypertension: Secondary | ICD-10-CM | POA: Diagnosis not present

## 2021-10-07 DIAGNOSIS — E669 Obesity, unspecified: Secondary | ICD-10-CM | POA: Diagnosis not present

## 2021-10-07 DIAGNOSIS — G4733 Obstructive sleep apnea (adult) (pediatric): Secondary | ICD-10-CM | POA: Diagnosis not present

## 2021-10-07 DIAGNOSIS — N390 Urinary tract infection, site not specified: Secondary | ICD-10-CM | POA: Diagnosis not present

## 2021-10-07 DIAGNOSIS — N183 Chronic kidney disease, stage 3 unspecified: Secondary | ICD-10-CM | POA: Diagnosis not present

## 2021-10-13 ENCOUNTER — Ambulatory Visit (INDEPENDENT_AMBULATORY_CARE_PROVIDER_SITE_OTHER): Payer: Medicare HMO | Admitting: Family Medicine

## 2021-10-13 ENCOUNTER — Encounter (INDEPENDENT_AMBULATORY_CARE_PROVIDER_SITE_OTHER): Payer: Self-pay | Admitting: Family Medicine

## 2021-10-13 VITALS — BP 130/84 | HR 76 | Temp 98.0°F | Ht 63.0 in | Wt 216.0 lb

## 2021-10-13 DIAGNOSIS — Z6838 Body mass index (BMI) 38.0-38.9, adult: Secondary | ICD-10-CM

## 2021-10-13 DIAGNOSIS — E669 Obesity, unspecified: Secondary | ICD-10-CM

## 2021-10-13 DIAGNOSIS — F5089 Other specified eating disorder: Secondary | ICD-10-CM

## 2021-10-19 ENCOUNTER — Telehealth (INDEPENDENT_AMBULATORY_CARE_PROVIDER_SITE_OTHER): Payer: Self-pay | Admitting: Psychology

## 2021-10-19 ENCOUNTER — Telehealth (INDEPENDENT_AMBULATORY_CARE_PROVIDER_SITE_OTHER): Payer: Medicare HMO | Admitting: Psychology

## 2021-10-19 NOTE — Progress Notes (Unsigned)
  Office: [phone]  /  Fax: [phone]    Date: October 19, 2021    Appointment Start Time: *** Duration: *** minutes Provider: Glennie Isle, Psy.D. Type of Session: Individual Therapy  Location of Patient: {gbptloc:23249} (private location) Location of Provider: Provider's Home (private office) Type of Contact: Telepsychological Visit via MyChart Video Visit  Session Content: This provider called Kathe at 11:02am as she did not present for today's appointment. A HIPAA compliant voicemail was left requesting a call back.  As such, today's appointment was initiated *** minutes late.Jimmie is a 71 y.o. female presenting for a follow-up appointment to address the previously established treatment goal of increasing coping skills.Today's appointment was a telepsychological visit. San provided verbal consent for today's telepsychological appointment and she is aware she is responsible for securing confidentiality on her end of the session. Prior to proceeding with today's appointment, Evony's physical location at the time of this appointment was obtained as well a phone number she could be reached at in the event of technical difficulties. Chrisie and this provider participated in today's telepsychological service.   This provider conducted a brief check-in. *** Kelee was receptive to today's appointment as evidenced by openness to sharing, responsiveness to feedback, and {gbreceptiveness:23401}.  Mental Status Examination:  Appearance: {Appearance:22431} Behavior: {Behavior:22445} Mood: {gbmood:21757} Affect: {Affect:22436} Speech: {Speech:22432} Eye Contact: {Eye Contact:22433} Psychomotor Activity: {Motor Activity:22434} Gait: {gbgait:23404} Thought Process: {thought process:22448}  Thought Content/Perception: {disturbances:22451} Orientation: {Orientation:22437} Memory/Concentration: {gbcognition:22449} Insight: {Insight:22446} Judgment: {Insight:22446}  Interventions:  {Interventions  for Progress Notes:23405}  DSM-5 Diagnosis(es): {Diagnoses:22752}  Treatment Goal & Progress: During the initial appointment with this provider, the following treatment goal was established: increase coping skills. Nakesha has demonstrated progress in her goal as evidenced by {gbtxprogress:22839}. Tamar also {gbtxprogress2:22951}.  Plan: The next appointment is scheduled for *** at ***, which will be via MyChart Video Visit. The next session will focus on {Plan for Next Appointment:23400}.

## 2021-10-19 NOTE — Telephone Encounter (Signed)
  Office: [phone]  /  Fax: [phone]  Date of Call: October 19, 2021  Time of Call: 11:02am Provider: Glennie Isle, PsyD  CONTENT: This provider called Cheyenne Mcdonald to check-in as she did not present for today's MyChart Video Visit appointment at 11am. A HIPAA compliant voicemail was left requesting a call back. Of note, this provider stayed on the MyChart Video Visit appointment for 5 minutes prior to signing off per the clinic's grace period policy.    PLAN: This provider will wait for Cheyenne Mcdonald to call back. No further follow-up planned by this provider.

## 2021-10-23 NOTE — Progress Notes (Unsigned)
Chief Complaint:   OBESITY Cheyenne Mcdonald is here to discuss her progress with her obesity treatment plan along with follow-up of her obesity related diagnoses. Cheyenne Mcdonald is on the Category 1 Plan and states she is following her eating plan approximately 75% of the time. Cheyenne Mcdonald states she is doing water aerobics 45-60 minutes 3 times per week.  Today's visit was #: 6 Starting weight: 220 lbs Starting date: 07/27/2021 Today's weight: 216 lbs Today's date: 10/13/2021 Total lbs lost to date: 4 Total lbs lost since last in-office visit: 0  Interim History: Pt is working with Dr. Mallie Mussel on emotional eating and it is going well. She realizes she doesn't put herself first at all and doesn't make herself a priority. Pt needs to plan her meals better. She is doing 3-4 days a week of water aerobics, which she loves.  Subjective:   1. Other disorder of eating, emotional eating Cheyenne Mcdonald realizes she snacks when stressed and busy taking care of others.  Assessment/Plan:  No orders of the defined types were placed in this encounter.   There are no discontinued medications.   No orders of the defined types were placed in this encounter.    1. Other disorder of eating, emotional eating Behavior modification techniques were discussed today to help Cheyenne Mcdonald deal with her emotional/non-hunger eating behaviors.  Orders and follow up as documented in patient record.  Discussion with pt on emotional eating strategies and her motivation for wanting to lose weight. She will continue with Dr. Mallie Mussel to develop coping mechanisms. Follow prudent nutritional plan and don't skip on foods/proteins. Counseling done.   2. Obesity, current BMI 38.3 Cheyenne Mcdonald is currently in the action stage of change. As such, her goal is to continue with weight loss efforts. She has agreed to the Category 1 Plan, but pt desires to journal intake on [employer] for accountability.   Meal plan daily. Walk for 30 minutes 3 days a week. I showed pt how  to use [employer]itness[employer] today.  Exercise goals:  As is  Behavioral modification strategies: increasing lean protein intake, decreasing simple carbohydrates, and planning for success.  Cheyenne Mcdonald has agreed to follow-up with our clinic in 3 weeks. She was informed of the importance of frequent follow-up visits to maximize her success with intensive lifestyle modifications for her multiple health conditions.   Objective:   Blood pressure 130/84, pulse 76, temperature 98 F (36.7 C), height '5\' 3"'$  (1.6 m), weight 216 lb (98 kg), SpO2 98 %. Body mass index is 38.26 kg/m.  General: Cooperative, alert, well developed, in no acute distress. HEENT: Conjunctivae and lids unremarkable. Cardiovascular: Regular rhythm.  Lungs: Normal work of breathing. Neurologic: No focal deficits.   Lab Results  Component Value Date   CREATININE 1.17 (H) 08/31/2021   BUN 19 08/31/2021   NA 139 08/31/2021   K 4.0 08/31/2021   CL 108 08/31/2021   CO2 26 08/31/2021   Lab Results  Component Value Date   ALT 14 08/31/2021   AST 15 08/31/2021   ALKPHOS 43 08/31/2021   BILITOT 0.4 08/31/2021   Lab Results  Component Value Date   HGBA1C 5.9 (H) 07/27/2021   Lab Results  Component Value Date   INSULIN 16.9 07/27/2021   Lab Results  Component Value Date   TSH 2.200 07/27/2021   Lab Results  Component Value Date   CHOL 152 07/27/2021   HDL 53 07/27/2021   LDLCALC 83 07/27/2021   TRIG 86 07/27/2021   Lab Results  Component Value Date   VD25OH 55.4 07/27/2021   Lab Results  Component Value Date   WBC 3.6 (L) 08/31/2021   HGB 13.2 08/31/2021   HCT 37.5 08/31/2021   MCV 89.9 08/31/2021   PLT 150 08/31/2021   Attestation Statements:   Reviewed by clinician on day of visit: allergies, medications, problem list, medical history, surgical history, family history, social history, and previous encounter notes.  Time spent on visit including pre-visit chart review and post-visit care and charting was 30  minutes.   I, Kathlene November, BS, CMA, am acting as transcriptionist for [employer], DO.   I have reviewed the above documentation for accuracy and completeness, and I agree with the above. Marjory Sneddon, D.O.  The [place] was signed into law in 2016 which includes the topic of electronic health records.  This provides immediate access to information in MyChart.  This includes consultation notes, operative notes, office notes, lab results and pathology reports.  If you have any questions about what you read please let us know at your next visit so we can discuss your concerns and take corrective action if need be.  We are right here with you.

## 2021-10-27 ENCOUNTER — Ambulatory Visit (INDEPENDENT_AMBULATORY_CARE_PROVIDER_SITE_OTHER): Payer: Medicare HMO | Admitting: Adult Health

## 2021-11-01 ENCOUNTER — Telehealth (INDEPENDENT_AMBULATORY_CARE_PROVIDER_SITE_OTHER): Payer: Medicare HMO | Admitting: Psychology

## 2021-11-01 DIAGNOSIS — F5089 Other specified eating disorder: Secondary | ICD-10-CM | POA: Diagnosis not present

## 2021-11-01 NOTE — Progress Notes (Signed)
  Office: [phone]  /  Fax: [phone]    Date: November 01, 2021    Appointment Start Time: 10:00am Duration: 28 minutes Provider: Glennie Isle, Psy.D. Type of Session: Individual Therapy  Location of Patient: Home (private location) Location of Provider: Provider's Home (private office) Type of Contact: Telepsychological Visit via MyChart Video Visit  Session Content: Cheyenne Mcdonald is a 71 y.o. female presenting for a follow-up appointment to address the previously established treatment goal of increasing coping skills.Today's appointment was a telepsychological visit. Cheyenne Mcdonald provided verbal consent for today's telepsychological appointment and she is aware she is responsible for securing confidentiality on her end of the session. Prior to proceeding with today's appointment, Cheyenne Mcdonald's physical location at the time of this appointment was obtained as well a phone number she could be reached at in the event of technical difficulties. Cheyenne Mcdonald and this provider participated in today's telepsychological service.   This provider conducted a brief check-in. Cheyenne Mcdonald shared she is busy, noting she is engaged in various breast cancer awareness month activities. Despite being busy, she described making better choices and engaging in portion control, noting a reduction in emotional eating behaviors. Positive reinforcement was provided. Session focused further on self-compassion as she described "beating" herself up due to missing appointments after a recent trip to [place]. Further explored and processed. Cheyenne Mcdonald and this provider discussed checking in with the body and asking what can be done in the moment to comfort and/or care for herself (e.g., What do I need right now?). This provider also discussed the impact of the aforementioned on overall well-being and eating habits. Overall, Cheyenne Mcdonald was receptive to today's appointment as evidenced by openness to sharing, responsiveness to feedback, and willingness to work toward  increasing self-compassion.  Mental Status Examination:  Appearance: neat Behavior: appropriate to circumstances Mood: neutral Affect: mood congruent Speech: WNL Eye Contact: appropriate Psychomotor Activity: WNL Gait: unable to assess Thought Process: linear, logical, and goal directed and no evidence or endorsement of suicidal, homicidal, and self-harm ideation, plan and intent  Thought Content/Perception: no hallucinations, delusions, bizarre thinking or behavior endorsed or observed Orientation: AAOx4 Memory/Concentration: memory, attention, language, and fund of knowledge intact  Insight: good Judgment: good   Interventions:  Conducted a brief chart review Provided empathic reflections and validation Reviewed content from the previous session Provided positive reinforcement Employed supportive psychotherapy interventions to facilitate reduced distress and to improve coping skills with identified stressors Psychoeducation provided regarding self-compassion  DSM-5 Diagnosis(es): F50.89 Other Specified Feeding or Eating Disorder, Emotional Eating Behaviors  Treatment Goal & Progress: During the initial appointment with this provider, the following treatment goal was established: increase coping skills. Cheyenne Mcdonald has demonstrated progress in her goal as evidenced by increased awareness of hunger patterns, increased awareness of triggers for emotional eating behaviors, and reduction in emotional eating behaviors . Cheyenne Mcdonald also continues to demonstrate willingness to engage in learned skill(s).  Plan: The next appointment is scheduled for 11/ 06/2021 at 10am, which will be via MyChart Video Visit. The next session will focus on working towards the established treatment goal.

## 2021-11-03 ENCOUNTER — Encounter (INDEPENDENT_AMBULATORY_CARE_PROVIDER_SITE_OTHER): Payer: Self-pay | Admitting: Family Medicine

## 2021-11-03 ENCOUNTER — Ambulatory Visit (INDEPENDENT_AMBULATORY_CARE_PROVIDER_SITE_OTHER): Payer: Medicare HMO | Admitting: Family Medicine

## 2021-11-03 VITALS — BP 128/82 | HR 73 | Temp 98.2°F | Ht 63.0 in | Wt 214.0 lb

## 2021-11-03 DIAGNOSIS — Z6838 Body mass index (BMI) 38.0-38.9, adult: Secondary | ICD-10-CM | POA: Diagnosis not present

## 2021-11-03 DIAGNOSIS — E669 Obesity, unspecified: Secondary | ICD-10-CM

## 2021-11-03 DIAGNOSIS — E559 Vitamin D deficiency, unspecified: Secondary | ICD-10-CM | POA: Diagnosis not present

## 2021-11-03 DIAGNOSIS — E538 Deficiency of other specified B group vitamins: Secondary | ICD-10-CM

## 2021-11-15 ENCOUNTER — Telehealth (INDEPENDENT_AMBULATORY_CARE_PROVIDER_SITE_OTHER): Payer: Medicare HMO | Admitting: Psychology

## 2021-11-15 DIAGNOSIS — F5089 Other specified eating disorder: Secondary | ICD-10-CM | POA: Diagnosis not present

## 2021-11-15 NOTE — Progress Notes (Signed)
  Office: [phone]  /  Fax: [phone]    Date: November 15, 2021    Appointment Start Time: 10:06am Duration: 24 minutes Provider: Glennie Isle, Psy.D. Type of Session: Individual Therapy  Location of Patient: Home (private location) Location of Provider: Provider's Home (private office) Type of Contact: Telepsychological Visit via MyChart Video Visit  Session Content: This provider called Cheyenne Mcdonald at 10:02am as she did not present for today's appointment. She stated she was trying to join the appointment. Assistance was provided. As such, today's appointment was initiated 6 minutes late. Cheyenne Mcdonald is a 71 y.o. female presenting for a follow-up appointment to address the previously established treatment goal of increasing coping skills.Today's appointment was a telepsychological visit. Cheyenne Mcdonald provided verbal consent for today's telepsychological appointment and she is aware she is responsible for securing confidentiality on her end of the session. Prior to proceeding with today's appointment, Cheyenne Mcdonald's physical location at the time of this appointment was obtained as well a phone number she could be reached at in the event of technical difficulties. Cheyenne Mcdonald and this provider participated in today's telepsychological service.   This provider conducted a brief check-in. [name] shared, "I think the eating is going well. I can almost feel myself losing [weight]." She explained "meal prep" is very important for her. Cheyenne Mcdonald also described a reduction in emotional eating behaviors. Cheyenne Mcdonald of today's appointment focused further on self-compassion. This provider and [name] explored the story her mind tells her about her weight loss/eating habits that is stuck on repeat. Cheyenne Mcdonald of the appointment focused on re-writing a more compassionate version. She was observed writing. Furthermore, termination planning was discussed. Cheyenne Mcdonald was receptive to a follow-up appointment in 3-4 weeks and an additional follow-up/termination  appointment in 3-4 weeks after that. Overall, Cheyenne Mcdonald was receptive to today's appointment as evidenced by openness to sharing, responsiveness to feedback, and willingness to work toward increasing self-compassion.  Mental Status Examination:  Appearance: neat Behavior: appropriate to circumstances Mood: neutral Affect: mood congruent Speech: WNL Eye Contact: appropriate Psychomotor Activity: WNL Gait: unable to assess Thought Process: linear, logical, and goal directed and no evidence or endorsement of suicidal, homicidal, and self-harm ideation, plan and intent  Thought Content/Perception: no hallucinations, delusions, bizarre thinking or behavior endorsed or observed Orientation: AAOx4 Memory/Concentration: memory, attention, language, and fund of knowledge intact  Insight: good Judgment: good  Interventions:  Conducted a brief chart review Provided empathic reflections and validation Reviewed content from the previous session Provided positive reinforcement Employed supportive psychotherapy interventions to facilitate reduced distress and to improve coping skills with identified stressors Engaged pt in a self-compassion exercise Engaged pt in termination planning  DSM-5 Diagnosis(es): F50.89 Other Specified Feeding or Eating Disorder, Emotional Eating Behaviors  Treatment Goal & Progress: During the initial appointment with this provider, the following treatment goal was established: increase coping skills. Cheyenne Mcdonald has demonstrated progress in her goal as evidenced by increased awareness of hunger patterns, increased awareness of triggers for emotional eating behaviors, and reduction in emotional eating behaviors . Cheyenne Mcdonald also continues to demonstrate willingness to engage in learned skill(s).  Plan: The next appointment is scheduled for 12/06/2021 at 10am, which will be via MyChart Video Visit. The next session will focus on working towards the established treatment goal.

## 2021-11-16 DIAGNOSIS — N183 Chronic kidney disease, stage 3 unspecified: Secondary | ICD-10-CM | POA: Diagnosis not present

## 2021-11-16 DIAGNOSIS — I10 Essential (primary) hypertension: Secondary | ICD-10-CM | POA: Diagnosis not present

## 2021-11-16 NOTE — Progress Notes (Signed)
Chief Complaint:   OBESITY Cheyenne Mcdonald is here to discuss her progress with her obesity treatment plan along with follow-up of her obesity related diagnoses. Cheyenne Mcdonald is on the Category 1 Plan and states she is following her eating plan approximately 70-75% of the time. Cheyenne Mcdonald states she is doing water aerobics for 45 minutes 3 times per week.  Today's visit was #: 7 Starting weight: 220 lbs Starting date: 07/27/2021 Today's weight: 214 lbs Today's date: 11/03/2021 Total lbs lost to date: 6 Total lbs lost since last in-office visit: 2  Interim History: Patient states she feels good about the last couple of weeks.  She is doing better with food prep and food planning.  She is weighing her protein, and she has lost 12 pounds of fat mass and gained 10 pounds in muscle mass.  Subjective:   1. Vitamin D deficiency She is currently taking OTC vitamin D 5,000 IU each day. She denies nausea, vomiting or muscle weakness.  2. B12 deficiency Patient is taking B12 500 mcg, and she notes improving energy level.  She denies issues or concerns.  Assessment/Plan:  No orders of the defined types were placed in this encounter.   There are no discontinued medications.   No orders of the defined types were placed in this encounter.    1. Vitamin D deficiency Low Vitamin D level contributes to fatigue and are associated with obesity, breast, and colon cancer. She will continue OTC Vitamin D 5,000 IU every week and will follow-up for routine testing of Vitamin D, at least 2-3 times per year to avoid over-replacement.  2. B12 deficiency Patient will continue her B12 supplementation and her prudent nutritional plan.  We will continue to monitor periodically as needed.  3. Obesity, current BMI 39 Cheyenne Mcdonald is currently in the action stage of change. As such, her goal is to continue with weight loss efforts. She has agreed to the Category 1 Plan with breakfast options or keeping a food journal and adhering to  recommended goals of 1000-1100 calories and 80 grams of protein.   Exercise goals: As is.   Behavioral modification strategies: increasing lean protein intake, increasing water intake, meal planning and cooking strategies, and planning for success.  Cheyenne Mcdonald has agreed to follow-up with our clinic in 3 weeks. She was informed of the importance of frequent follow-up visits to maximize her success with intensive lifestyle modifications for her multiple health conditions.   Objective:   Blood pressure 128/82, pulse 73, temperature 98.2 F (36.8 C), height '5\' 3"'$  (1.6 m), weight 214 lb (97.1 kg), SpO2 99 %. Body mass index is 37.91 kg/m.  General: Cooperative, alert, well developed, in no acute distress. HEENT: Conjunctivae and lids unremarkable. Cardiovascular: Regular rhythm.  Lungs: Normal work of breathing. Neurologic: No focal deficits.   Lab Results  Component Value Date   CREATININE 1.17 (H) 08/31/2021   BUN 19 08/31/2021   NA 139 08/31/2021   K 4.0 08/31/2021   CL 108 08/31/2021   CO2 26 08/31/2021   Lab Results  Component Value Date   ALT 14 08/31/2021   AST 15 08/31/2021   ALKPHOS 43 08/31/2021   BILITOT 0.4 08/31/2021   Lab Results  Component Value Date   HGBA1C 5.9 (H) 07/27/2021   Lab Results  Component Value Date   INSULIN 16.9 07/27/2021   Lab Results  Component Value Date   TSH 2.200 07/27/2021   Lab Results  Component Value Date   CHOL 152 07/27/2021  HDL 53 07/27/2021   LDLCALC 83 07/27/2021   TRIG 86 07/27/2021   Lab Results  Component Value Date   VD25OH 55.4 07/27/2021   Lab Results  Component Value Date   WBC 3.6 (L) 08/31/2021   HGB 13.2 08/31/2021   HCT 37.5 08/31/2021   MCV 89.9 08/31/2021   PLT 150 08/31/2021   No results found for: "IRON", "TIBC", "FERRITIN"  Attestation Statements:   Reviewed by clinician on day of visit: allergies, medications, problem list, medical history, surgical history, family history, social history,  and previous encounter notes.  Time spent on visit including pre-visit chart review and post-visit care and charting was 30 minutes.   Wilhemena Durie, am acting as transcriptionist for [employer], DO.   I have reviewed the above documentation for accuracy and completeness, and I agree with the above. Marjory Sneddon, D.O.  The [place] was signed into law in 2016 which includes the topic of electronic health records.  This provides immediate access to information in MyChart.  This includes consultation notes, operative notes, office notes, lab results and pathology reports.  If you have any questions about what you read please let us know at your next visit so we can discuss your concerns and take corrective action if need be.  We are right here with you.

## 2021-11-17 DIAGNOSIS — E119 Type 2 diabetes mellitus without complications: Secondary | ICD-10-CM | POA: Diagnosis not present

## 2021-11-17 DIAGNOSIS — N183 Chronic kidney disease, stage 3 unspecified: Secondary | ICD-10-CM | POA: Diagnosis not present

## 2021-11-17 DIAGNOSIS — N390 Urinary tract infection, site not specified: Secondary | ICD-10-CM | POA: Diagnosis not present

## 2021-11-17 DIAGNOSIS — G4733 Obstructive sleep apnea (adult) (pediatric): Secondary | ICD-10-CM | POA: Diagnosis not present

## 2021-11-17 DIAGNOSIS — I10 Essential (primary) hypertension: Secondary | ICD-10-CM | POA: Diagnosis not present

## 2021-11-24 ENCOUNTER — Encounter (INDEPENDENT_AMBULATORY_CARE_PROVIDER_SITE_OTHER): Payer: Self-pay | Admitting: Family Medicine

## 2021-11-24 ENCOUNTER — Ambulatory Visit (INDEPENDENT_AMBULATORY_CARE_PROVIDER_SITE_OTHER): Payer: Medicare HMO | Admitting: Family Medicine

## 2021-11-24 VITALS — BP 118/78 | HR 75 | Temp 97.8°F | Ht 63.0 in | Wt 209.0 lb

## 2021-11-24 DIAGNOSIS — E538 Deficiency of other specified B group vitamins: Secondary | ICD-10-CM | POA: Diagnosis not present

## 2021-11-24 DIAGNOSIS — E559 Vitamin D deficiency, unspecified: Secondary | ICD-10-CM

## 2021-11-24 DIAGNOSIS — E669 Obesity, unspecified: Secondary | ICD-10-CM | POA: Diagnosis not present

## 2021-11-24 DIAGNOSIS — Z6837 Body mass index (BMI) 37.0-37.9, adult: Secondary | ICD-10-CM | POA: Diagnosis not present

## 2021-12-06 ENCOUNTER — Telehealth (INDEPENDENT_AMBULATORY_CARE_PROVIDER_SITE_OTHER): Payer: Medicare HMO | Admitting: Psychology

## 2021-12-06 DIAGNOSIS — F5089 Other specified eating disorder: Secondary | ICD-10-CM

## 2021-12-06 NOTE — Progress Notes (Signed)
Chief Complaint:   OBESITY Cheyenne Mcdonald is here to discuss her progress with her obesity treatment plan along with follow-up of her obesity related diagnoses. Cheyenne Mcdonald is on the Category 1 Plan and keeping a food journal and adhering to recommended goals of 1000-1100 calories and 80 protein and states she is following her eating plan approximately 85% of the time. Cheyenne Mcdonald states she is not exercising.   Today's visit was #: 8 Starting weight: 220 lbs Starting date: 07/27/2021 Today's weight: 209 lbs Today's date: 11/24/2021 Total lbs lost to date: 11 lbs Total lbs lost since last in-office visit: 5 lbs  Interim History: She has lost almost pure fat mass since starting with [place].  She is doing great.  She views it as a lifestyle change, "not a diet".  She denies any issues.   Subjective:   1. Vitamin D deficiency She is currently taking OTC vitamin D 5,000 IU  each day. She denies nausea, vomiting or muscle weakness.  2. B12 deficiency She is on B12 OTC 500 mcg daily.  Not sure she feels any more energy because of it.   Assessment/Plan:  No orders of the defined types were placed in this encounter.   There are no discontinued medications.   No orders of the defined types were placed in this encounter.    1. Vitamin D deficiency Continue Vitamin D supplement.  Recheck labs at next office visit.  Low Vitamin D level contributes to fatigue and are associated with obesity, breast, and colon cancer. She agrees to continue to take prescription Vitamin D '@50'$ ,000 IU every week and will follow-up for routine testing of Vitamin D, at least 2-3 times per year to avoid over-replacement.  2. B12 deficiency Recheck B12 next office visit.   3. Obesity, current BMI 37.1 Strategies discussed with patient.  Handouts given.   Cheyenne Mcdonald is currently in the action stage of change. As such, her goal is to continue with weight loss efforts. She has agreed to the Category 1 Plan and keeping a food journal and  adhering to recommended goals of 1000-1100 calories and 80+ protein.   Exercise goals: All adults should avoid inactivity. Some physical activity is better than none, and adults who participate in any amount of physical activity gain some health benefits. May start if she can walk  Behavioral modification strategies: increasing lean protein intake, meal planning and cooking strategies, and holiday eating strategies .  Cheyenne Mcdonald has agreed to follow-up with our clinic in 2 weeks. She was informed of the importance of frequent follow-up visits to maximize her success with intensive lifestyle modifications for her multiple health conditions.   Objective:   Blood pressure 118/78, pulse 75, temperature 97.8 F (36.6 C), height '5\' 3"'$  (1.6 m), weight 209 lb (94.8 kg), SpO2 97 %. Body mass index is 37.02 kg/m.  General: Cooperative, alert, well developed, in no acute distress. HEENT: Conjunctivae and lids unremarkable. Cardiovascular: Regular rhythm.  Lungs: Normal work of breathing. Neurologic: No focal deficits.   Lab Results  Component Value Date   CREATININE 1.17 (H) 08/31/2021   BUN 19 08/31/2021   NA 139 08/31/2021   K 4.0 08/31/2021   CL 108 08/31/2021   CO2 26 08/31/2021   Lab Results  Component Value Date   ALT 14 08/31/2021   AST 15 08/31/2021   ALKPHOS 43 08/31/2021   BILITOT 0.4 08/31/2021   Lab Results  Component Value Date   HGBA1C 5.9 (H) 07/27/2021   Lab Results  Component Value Date   INSULIN 16.9 07/27/2021   Lab Results  Component Value Date   TSH 2.200 07/27/2021   Lab Results  Component Value Date   CHOL 152 07/27/2021   HDL 53 07/27/2021   LDLCALC 83 07/27/2021   TRIG 86 07/27/2021   Lab Results  Component Value Date   VD25OH 55.4 07/27/2021   Lab Results  Component Value Date   WBC 3.6 (L) 08/31/2021   HGB 13.2 08/31/2021   HCT 37.5 08/31/2021   MCV 89.9 08/31/2021   PLT 150 08/31/2021   No results found for: "IRON", "TIBC",  "FERRITIN"  Attestation Statements:   Reviewed by clinician on day of visit: allergies, medications, problem list, medical history, surgical history, family history, social history, and previous encounter notes.  Time spent on visit including pre-visit chart review and post-visit care and charting was 30 minutes.   I, Davy Pique, RMA, am acting as Location manager for [employer], DO.   I have reviewed the above documentation for accuracy and completeness, and I agree with the above. Marjory Sneddon, D.O.  The [place] was signed into law in 2016 which includes the topic of electronic health records.  This provides immediate access to information in MyChart.  This includes consultation notes, operative notes, office notes, lab results and pathology reports.  If you have any questions about what you read please let us know at your next visit so we can discuss your concerns and take corrective action if need be.  We are right here with you.

## 2021-12-06 NOTE — Progress Notes (Signed)
  Office: [phone]  /  Fax: [phone]    Date: December 06, 2021    Appointment Start Time: 10:01am Duration: 32 minutes Provider: Glennie Isle, Psy.D. Type of Session: Individual Therapy  Location of Patient: Home (private location) Location of Provider: Provider's Home (private office) Type of Contact: Telepsychological Visit via MyChart Video Visit  Session Content: Cheyenne Mcdonald is a 71 y.o. female presenting for a follow-up appointment to address the previously established treatment goal of increasing coping skills.Today's appointment was a telepsychological visit. Darleny provided verbal consent for today's telepsychological appointment and she is aware she is responsible for securing confidentiality on her end of the session. Prior to proceeding with today's appointment, Cheyenne Mcdonald's physical location at the time of this appointment was obtained as well a phone number she could be reached at in the event of technical difficulties. Cheyenne Mcdonald and this provider participated in today's telepsychological service. Of note, today's appointment was switched to a regular telephone call at 10:23am with Cheyenne Mcdonald's verbal consent due to technical issues.   This provider conducted a brief check-in. Cheyenne Mcdonald stated she implemented some of the strategies shared by Dr. Raliegh Scarlet for Thanksgiving. She continues to make better choices and engage in portion control, noting a continued reduction in emotional eating behaviors. Positive reinforcement was provided. Given upcoming holiday celebrations, this provider and Cheyenne Mcdonald discussed what went well during Thanksgiving and what was challenging to help her prepare for upcoming celebrations. She was encouraged to make notes for future reference; she was observed writing. Overall, Cheyenne Mcdonald was receptive to today's appointment as evidenced by openness to sharing, responsiveness to feedback, and willingness to continue engaging in learned skills.  Mental Status Examination:  Appearance:  neat Behavior: appropriate to circumstances Mood: neutral Affect: mood congruent Speech: WNL Eye Contact: appropriate Psychomotor Activity: WNL Gait: unable to assess Thought Process: linear, logical, and goal directed and no evidence or endorsement of suicidal, homicidal, and self-harm ideation, plan and intent  Thought Content/Perception: no hallucinations, delusions, bizarre thinking or behavior endorsed or observed Orientation: AAOx4 Memory/Concentration: memory, attention, language, and fund of knowledge intact  Insight: good Judgment: good  Interventions:  Conducted a brief chart review Provided empathic reflections and validation Provided positive reinforcement Employed supportive psychotherapy interventions to facilitate reduced distress and to improve coping skills with identified stressors Engaged patient in problem solving  DSM-5 Diagnosis(es): F50.89 Other Specified Feeding or Eating Disorder, Emotional Eating Behaviors  Treatment Goal & Progress: During the initial appointment with this provider, the following treatment goal was established: increase coping skills. Cheyenne Mcdonald has demonstrated progress in her goal as evidenced by increased awareness of hunger patterns, increased awareness of triggers for emotional eating behaviors, and reduction in emotional eating behaviors . Cheyenne Mcdonald also continues to demonstrate willingness to engage in learned skill(s).  Plan: The next appointment is scheduled for 01/04/2022 at 10am, which will be via MyChart Video Visit. The next session will focus on working towards the established treatment goal and termination.

## 2021-12-08 ENCOUNTER — Ambulatory Visit (INDEPENDENT_AMBULATORY_CARE_PROVIDER_SITE_OTHER): Payer: Medicare HMO | Admitting: Family Medicine

## 2021-12-08 ENCOUNTER — Encounter (INDEPENDENT_AMBULATORY_CARE_PROVIDER_SITE_OTHER): Payer: Self-pay | Admitting: Family Medicine

## 2021-12-08 VITALS — BP 108/58 | HR 86 | Temp 98.6°F | Ht 63.0 in | Wt 212.0 lb

## 2021-12-08 DIAGNOSIS — Z6837 Body mass index (BMI) 37.0-37.9, adult: Secondary | ICD-10-CM

## 2021-12-08 DIAGNOSIS — E669 Obesity, unspecified: Secondary | ICD-10-CM

## 2021-12-08 DIAGNOSIS — E559 Vitamin D deficiency, unspecified: Secondary | ICD-10-CM | POA: Diagnosis not present

## 2021-12-08 DIAGNOSIS — R7303 Prediabetes: Secondary | ICD-10-CM | POA: Diagnosis not present

## 2021-12-08 DIAGNOSIS — E538 Deficiency of other specified B group vitamins: Secondary | ICD-10-CM | POA: Insufficient documentation

## 2021-12-09 LAB — CMP14+EGFR
ALT: 10 IU/L (ref 0–32)
AST: 17 IU/L (ref 0–40)
Albumin/Globulin Ratio: 1.9 (ref 1.2–2.2)
Albumin: 4.1 g/dL (ref 3.8–4.8)
Alkaline Phosphatase: 54 IU/L (ref 44–121)
BUN/Creatinine Ratio: 19 (ref 12–28)
BUN: 21 mg/dL (ref 8–27)
Bilirubin Total: 0.2 mg/dL (ref 0.0–1.2)
CO2: 22 mmol/L (ref 20–29)
Calcium: 9.4 mg/dL (ref 8.7–10.3)
Chloride: 106 mmol/L (ref 96–106)
Creatinine, Ser: 1.09 mg/dL — ABNORMAL HIGH (ref 0.57–1.00)
Globulin, Total: 2.2 g/dL (ref 1.5–4.5)
Glucose: 93 mg/dL (ref 70–99)
Potassium: 4.1 mmol/L (ref 3.5–5.2)
Sodium: 141 mmol/L (ref 134–144)
Total Protein: 6.3 g/dL (ref 6.0–8.5)
eGFR: 54 mL/min/{1.73_m2} — ABNORMAL LOW (ref >59)

## 2021-12-09 LAB — VITAMIN D 25 HYDROXY (VIT D DEFICIENCY, FRACTURES): Vit D, 25-Hydroxy: 67.3 ng/mL (ref 30.0–100.0)

## 2021-12-09 LAB — LIPID PANEL WITH LDL/HDL RATIO
Cholesterol, Total: 135 mg/dL (ref 100–199)
HDL: 49 mg/dL (ref >39)
LDL Chol Calc (NIH): 73 mg/dL (ref 0–99)
LDL/HDL Ratio: 1.5 ratio (ref 0.0–3.2)
Triglycerides: 62 mg/dL (ref 0–149)
VLDL Cholesterol Cal: 13 mg/dL (ref 5–40)

## 2021-12-09 LAB — HEMOGLOBIN A1C
Est. average glucose Bld gHb Est-mCnc: 126 mg/dL
Hgb A1c MFr Bld: 6 % — ABNORMAL HIGH (ref 4.8–5.6)

## 2021-12-09 LAB — INSULIN, RANDOM: INSULIN: 17.6 u[IU]/mL (ref 2.6–24.9)

## 2021-12-09 LAB — VITAMIN B12: Vitamin B-12: 860 pg/mL (ref 232–1245)

## 2021-12-20 NOTE — Progress Notes (Unsigned)
Chief Complaint:   OBESITY Cheyenne Mcdonald is here to discuss her progress with her obesity treatment plan along with follow-up of her obesity related diagnoses. Cheyenne Mcdonald is on the Category 1 Plan and keeping a food journal and adhering to recommended goals of 1000-1100 calories and 80+ grams of protein daily and states she is following her eating plan approximately 75% of the time. Cheyenne Mcdonald states she is doing 0 minutes 0 times per week.  Today's visit was #: 9 Starting weight: 220 lbs Starting date: 07/27/2021 Today's weight: 212 lbs Today's date: 12/08/2021 Total lbs lost to date: 8 Total lbs lost since last in-office visit: 0  Interim History: Cheyenne Mcdonald did some celebration eating over Thanksgiving. She especially struggled with eating leftovers. She is making strategies and will have increased temptations.   Subjective:   1. B12 deficiency Cheyenne Mcdonald's last B12 was below goal, and she is working on increasing B12 rich foods.   2. Vitamin D deficiency Cheyenne Mcdonald is on Vitamin D, and she is due for labs.   3. Prediabetes Cheyenne Mcdonald is working on her diet but she is struggling a bit more recently over the holidays.   Assessment/Plan:   1. B12 deficiency We will check labs today. The diagnosis was reviewed with the patient. We will continue to monitor. Orders and follow up as documented in patient record.  - Vitamin B12  2. Vitamin D deficiency We will check labs today, and Cheyenne Mcdonald will follow-up for routine testing of Vitamin D, at least 2-3 times per year to avoid over-replacement.  - VITAMIN D 25 Hydroxy (Vit-D Deficiency, Fractures)  3. Prediabetes We will check labs today. Cheyenne Mcdonald will continue to work on weight loss, exercise, and decreasing simple carbohydrates to help decrease the risk of diabetes.   - CMP14+EGFR - Lipid Panel With LDL/HDL Ratio - Insulin, random - Hemoglobin A1c  4. Obesity, Current BMI 37.6 Cheyenne Mcdonald is currently in the action stage of change. As such, her goal is to continue with weight  loss efforts. She has agreed to keeping a food journal and adhering to recommended goals of 1000-1100 calories and 80+ grams of protein daily.   Behavioral modification strategies: increasing lean protein intake.  Cheyenne Mcdonald has agreed to follow-up with our clinic in 3 weeks. She was informed of the importance of frequent follow-up visits to maximize her success with intensive lifestyle modifications for her multiple health conditions.   Cheyenne Mcdonald was informed we would discuss her lab results at her next visit unless there is a critical issue that needs to be addressed sooner. Cheyenne Mcdonald agreed to keep her next visit at the agreed upon time to discuss these results.  Objective:   Blood pressure (!) 108/58, pulse 86, temperature 98.6 F (37 C), height _0  (1.6 m), weight 212 lb (96.2 kg), SpO2 100 %. Body mass index is 37.55 kg/m.  General: Cooperative, alert, well developed, in no acute distress. HEENT: Conjunctivae and lids unremarkable. Cardiovascular: Regular rhythm.  Lungs: Normal work of breathing. Neurologic: No focal deficits.   Lab Results  Component Value Date   CREATININE 1.09 (H) 12/08/2021   BUN 21 12/08/2021   NA 141 12/08/2021   K 4.1 12/08/2021   CL 106 12/08/2021   CO2 22 12/08/2021   Lab Results  Component Value Date   ALT 10 12/08/2021   AST 17 12/08/2021   ALKPHOS 54 12/08/2021   BILITOT 0.2 12/08/2021   Lab Results  Component Value Date   HGBA1C 6.0 (H) 12/08/2021   HGBA1C  5.9 (H) 07/27/2021   Lab Results  Component Value Date   INSULIN 17.6 12/08/2021   INSULIN 16.9 07/27/2021   Lab Results  Component Value Date   TSH 2.200 07/27/2021   Lab Results  Component Value Date   CHOL 135 12/08/2021   HDL 49 12/08/2021   LDLCALC 73 12/08/2021   TRIG 62 12/08/2021   Lab Results  Component Value Date   VD25OH 67.3 12/08/2021   VD25OH 55.4 07/27/2021   Lab Results  Component Value Date   WBC 3.6 (L) 08/31/2021   HGB 13.2 08/31/2021   HCT 37.5 08/31/2021    MCV 89.9 08/31/2021   PLT 150 08/31/2021   No results found for: "IRON", "TIBC", "FERRITIN"  Attestation Statements:   Reviewed by clinician on day of visit: allergies, medications, problem list, medical history, surgical history, family history, social history, and previous encounter notes.  I have personally spent 40 minutes total time today in preparation, patient care, and documentation for this visit, including the following: review of clinical lab tests; review of medical tests/procedures/services.   I, Trixie Dredge, am acting as transcriptionist for Dennard Nip, MD.  I have reviewed the above documentation for accuracy and completeness, and I agree with the above. -  Dennard Nip, MD

## 2021-12-29 ENCOUNTER — Ambulatory Visit (INDEPENDENT_AMBULATORY_CARE_PROVIDER_SITE_OTHER): Payer: Medicare HMO | Admitting: Family Medicine

## 2022-01-04 ENCOUNTER — Telehealth (INDEPENDENT_AMBULATORY_CARE_PROVIDER_SITE_OTHER): Payer: Medicare HMO | Admitting: Psychology

## 2022-01-06 DIAGNOSIS — D0510 Intraductal carcinoma in situ of unspecified breast: Secondary | ICD-10-CM | POA: Diagnosis not present

## 2022-01-06 DIAGNOSIS — E782 Mixed hyperlipidemia: Secondary | ICD-10-CM | POA: Diagnosis not present

## 2022-01-06 DIAGNOSIS — E559 Vitamin D deficiency, unspecified: Secondary | ICD-10-CM | POA: Diagnosis not present

## 2022-01-06 DIAGNOSIS — E669 Obesity, unspecified: Secondary | ICD-10-CM | POA: Diagnosis not present

## 2022-01-06 DIAGNOSIS — N182 Chronic kidney disease, stage 2 (mild): Secondary | ICD-10-CM | POA: Diagnosis not present

## 2022-01-06 DIAGNOSIS — R600 Localized edema: Secondary | ICD-10-CM | POA: Diagnosis not present

## 2022-01-06 DIAGNOSIS — R7303 Prediabetes: Secondary | ICD-10-CM | POA: Diagnosis not present

## 2022-01-06 DIAGNOSIS — G4733 Obstructive sleep apnea (adult) (pediatric): Secondary | ICD-10-CM | POA: Diagnosis not present

## 2022-01-06 DIAGNOSIS — M1711 Unilateral primary osteoarthritis, right knee: Secondary | ICD-10-CM | POA: Diagnosis not present

## 2022-01-11 ENCOUNTER — Telehealth (INDEPENDENT_AMBULATORY_CARE_PROVIDER_SITE_OTHER): Payer: Medicare HMO | Admitting: Psychology

## 2022-01-11 DIAGNOSIS — F4320 Adjustment disorder, unspecified: Secondary | ICD-10-CM

## 2022-01-11 DIAGNOSIS — F5089 Other specified eating disorder: Secondary | ICD-10-CM

## 2022-01-11 NOTE — Progress Notes (Signed)
  Office: [phone]  /  Fax: [phone]    Date: January 11, 2022    Appointment Start Time: 10:02am Duration: 27 minutes Provider: Glennie Isle, Psy.D. Type of Session: Individual Therapy  Location of Patient: Home (private location) Location of Provider: Provider's Home (private office) Type of Contact: Telepsychological Visit via MyChart Video Visit  Session Content: Cheyenne Mcdonald is a 72 y.o. female presenting for a follow-up appointment to address the previously established treatment goal of increasing coping skills.Today's appointment was a telepsychological visit. Cheyenne Mcdonald provided verbal consent for today's telepsychological appointment and she is aware she is responsible for securing confidentiality on her end of the session. Prior to proceeding with today's appointment, Cheyenne Mcdonald's physical location at the time of this appointment was obtained as well a phone number she could be reached at in the event of technical difficulties. Cheyenne Mcdonald and this provider participated in today's telepsychological service.   This provider conducted a brief check-in. Cheyenne Mcdonald acknowledged deviations from her structured meal plan during the holidays. She reported she learned unpleasant news regarding her adult granddaughter, which impacted her eating habits. Associated thoughts and feelings were briefly processed. This provider recommended longer-term therapeutic services. Cheyenne Mcdonald provided verbal consent for this provider to place a referral to address ongoing stressors. Cheyenne Mcdonald of today's appointment focused on eating habits. She acknowledged she has "not journaled since Christmas." Further explored and processed. She was engaged in problem solving to help her get back on track. Notably, she described organization as a strength; therefore, this provider explored with Cheyenne Mcdonald what organization strategies can be utilized to help her reach her goal(s) with the clinic. Overall, Cheyenne Mcdonald was receptive to today's appointment as evidenced by  openness to sharing, responsiveness to feedback, and willingness to implement discussed strategies .  Mental Status Examination:  Appearance: neat Behavior: appropriate to circumstances Mood: neutral Affect: mood congruent Speech: WNL Eye Contact: appropriate Psychomotor Activity: WNL Gait: unable to assess Thought Process: linear, logical, and goal directed and no evidence or endorsement of suicidal, homicidal, and self-harm ideation, plan and intent  Thought Content/Perception: no hallucinations, delusions, bizarre thinking or behavior endorsed or observed Orientation: AAOx4 Memory/Concentration: memory, attention, language, and fund of knowledge intact  Insight: fair Judgment: fair  Interventions:  Conducted a brief chart review Provided empathic reflections and validation Reviewed content from the previous session Employed supportive psychotherapy interventions to facilitate reduced distress and to improve coping skills with identified stressors Employed motivational interviewing skills to assess patient's willingness/desire to adhere to recommended medical treatments and assignments Engaged patient in problem solving Recommended/discussed options for longer-term therapeutic services  DSM-5 Diagnosis(es): F50.89 Other Specified Feeding or Eating Disorder, Emotional Eating Behaviors and F43.20 Adjustment Disorder, Unspecified   Treatment Goal & Progress: During the initial appointment with this provider, the following treatment goal was established: increase coping skills. Cheyenne Mcdonald has demonstrated progress in her goal as evidenced by increased awareness of hunger patterns, increased awareness of triggers for emotional eating behaviors, and reduction in emotional eating behaviors . Cheyenne Mcdonald also continues to demonstrate willingness to engage in learned skill(s).  Plan: Due to recent deviations, a follow-up appointment was recommended. The next appointment is scheduled for 01/25/2022 at 10am,  which will be via MyChart Video Visit. The next session will focus on working towards the established treatment goal. Additionally, she provided verbal consent for this provider to place a referral with [facility] and for this provider to email additional referral options.

## 2022-01-17 ENCOUNTER — Ambulatory Visit: Payer: Medicare HMO | Attending: Surgery

## 2022-01-17 VITALS — Wt 219.0 lb

## 2022-01-17 DIAGNOSIS — Z483 Aftercare following surgery for neoplasm: Secondary | ICD-10-CM | POA: Insufficient documentation

## 2022-01-17 NOTE — Therapy (Signed)
OUTPATIENT PHYSICAL THERAPY [facility] SCREENING NOTE   Patient Name: Cheyenne Mcdonald MRN: [id number] DOB:[birth year], 72 y.o., female Today's Date: 01/17/2022  PCP: Cheyenne Sailors, PA REFERRING PROVIDER: [name], MD   PT End of Session - 01/17/22 0933     Visit Number 13   # unchanged due to screen only   PT Start Time 0932    PT Stop Time 0937    PT Time Calculation (min) 5 min    Activity Tolerance Patient tolerated treatment well    Behavior During Therapy [name] for tasks assessed/performed             Past Medical History:  Diagnosis Date   Anemia    Arthritis    gout big toe   Breast cancer ([place])    left breast IDC, right breast DCIS   History of radiation therapy 07/20/2020   bilateral breasts 06/01/2020-07/20/2020  Dr Gery Pray   Hypertension    Kidney problem    Obesity    Personal history of chemotherapy    2022 bilat lumpectomies w/rad w/chemo   Personal history of radiation therapy    2022 bilat lumpectomies w/rad w/chemo   Prediabetes    Sleep apnea    does not use CPAP   Vitamin D deficiency    Past Surgical History:  Procedure Laterality Date   ABDOMINAL HYSTERECTOMY     BREAST LUMPECTOMY Bilateral    2022 bilat lumpectomies w/rad w/chemo   BREAST LUMPECTOMY WITH RADIOACTIVE SEED AND SENTINEL LYMPH NODE BIOPSY Left 04/16/2020   Procedure: LEFT BREAST LUMPECTOMY WITH RADIOACTIVE SEED AND LEFT AXILLARY SENTINEL LYMPH NODE BIOPSY;  Surgeon: Cheyenne Keens, MD;  Location: [facility];  Service: General;  Laterality: Left;   BREAST LUMPECTOMY WITH RADIOACTIVE SEED LOCALIZATION Right 04/16/2020   Procedure: RIGHT BREAST LUMPECTOMY WITH RADIOACTIVE SEED LOCALIZATION;  Surgeon: Cheyenne Keens, MD;  Location: [facility];  Service: General;  Laterality: Right;   IR IMAGING GUIDED PORT INSERTION  10/18/2019   PORT-A-CATH REMOVAL Right 09/02/2020   Procedure: REMOVAL PORT-A-CATH;  Surgeon: Cheyenne Keens, MD;   Location: [facility];  Service: General;  Laterality: Right;   Patient Active Problem List   Diagnosis Date Noted   B12 deficiency 12/08/2021   Class 2 severe obesity with serious comorbidity and body mass index (BMI) of 39.0 to 39.9 in adult ([place]) 12/08/2021   Vitamin D deficiency 07/27/2021   Primary hypertension 07/27/2021   Prediabetes 07/27/2021   Ductal carcinoma in situ (DCIS) of right breast 05/06/2020   Genetic testing 11/04/2019   Port-A-Cath in place 10/22/2019   Malignant neoplasm of upper-outer quadrant of left breast in female, estrogen receptor negative ([place]) 10/03/2019   Unspecified arthropathy, ankle and foot 09/24/2009   HALLUX RIGIDUS 09/24/2009    REFERRING DIAG: left breast cancer at risk for lymphedema  THERAPY DIAG:  Aftercare following surgery for neoplasm  PERTINENT HISTORY: Patient was diagnosed on 09/30/2019 with 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 It measures 9 mm and 1.4 cm both located in the upper outer quadrant. Ki67 is 90%; Lt lumpectomy and SLNB and Rt DCIS lumpectomy 04/16/2020.   PRECAUTIONS: left UE Lymphedema risk, None  SUBJECTIVE: Pt returns for her 6 month L-Dex screen.   PAIN:  Are you having pain? No  SOZO SCREENING: Patient was assessed today using the SOZO machine to determine the lymphedema index score. This was compared to her baseline score. It was  determined that she is within the recommended range when compared to her baseline and no further action is needed at this time. She will continue SOZO screenings. These are done every 3 months for 2 years post operatively followed by every 6 months for 2 years, and then annually.   L-DEX FLOWSHEETS - 01/17/22 0900       L-DEX LYMPHEDEMA SCREENING   Measurement Type Unilateral    L-DEX MEASUREMENT EXTREMITY Upper Extremity    POSITION  Standing    DOMINANT SIDE Left    At Risk Side Right    BASELINE SCORE  (UNILATERAL) 3.3    L-DEX SCORE (UNILATERAL) 6.3    VALUE CHANGE (UNILAT) 3              Cheyenne Mcdonald, PTA 01/17/2022, 9:36 AM

## 2022-01-18 ENCOUNTER — Ambulatory Visit (INDEPENDENT_AMBULATORY_CARE_PROVIDER_SITE_OTHER): Payer: Medicare HMO | Admitting: Family Medicine

## 2022-01-20 ENCOUNTER — Encounter (INDEPENDENT_AMBULATORY_CARE_PROVIDER_SITE_OTHER): Payer: Self-pay | Admitting: Family Medicine

## 2022-01-20 ENCOUNTER — Ambulatory Visit (INDEPENDENT_AMBULATORY_CARE_PROVIDER_SITE_OTHER): Payer: Medicare HMO | Admitting: Family Medicine

## 2022-01-20 VITALS — BP 125/79 | HR 73 | Temp 98.0°F | Ht 63.0 in | Wt 212.6 lb

## 2022-01-20 DIAGNOSIS — R7303 Prediabetes: Secondary | ICD-10-CM | POA: Diagnosis not present

## 2022-01-20 DIAGNOSIS — E559 Vitamin D deficiency, unspecified: Secondary | ICD-10-CM

## 2022-01-20 DIAGNOSIS — E538 Deficiency of other specified B group vitamins: Secondary | ICD-10-CM

## 2022-01-20 DIAGNOSIS — Z6837 Body mass index (BMI) 37.0-37.9, adult: Secondary | ICD-10-CM | POA: Diagnosis not present

## 2022-01-20 DIAGNOSIS — E669 Obesity, unspecified: Secondary | ICD-10-CM | POA: Diagnosis not present

## 2022-01-25 ENCOUNTER — Telehealth (INDEPENDENT_AMBULATORY_CARE_PROVIDER_SITE_OTHER): Payer: Medicare HMO | Admitting: Psychology

## 2022-01-25 DIAGNOSIS — F4320 Adjustment disorder, unspecified: Secondary | ICD-10-CM | POA: Diagnosis not present

## 2022-01-25 DIAGNOSIS — F5089 Other specified eating disorder: Secondary | ICD-10-CM | POA: Diagnosis not present

## 2022-01-25 NOTE — Progress Notes (Signed)
  Office: [phone]  /  Fax: [phone]    Date: January 25, 2022    Appointment Start Time: 10:00am Duration: 29 minutes Provider: Glennie Isle, Psy.D. Type of Session: Individual Therapy  Location of Patient: Home (private location) Location of Provider: Provider's Home (private office) Type of Contact: Telepsychological Visit via MyChart Video Visit  Session Content: Denell is a 72 y.o. female presenting for a follow-up appointment to address the previously established treatment goal of increasing coping skills.Today's appointment was a telepsychological visit. Soriah provided verbal consent for today's telepsychological appointment and she is aware she is responsible for securing confidentiality on her end of the session. Prior to proceeding with today's appointment, Yuliza's physical location at the time of this appointment was obtained as well a phone number she could be reached at in the event of technical difficulties. [place] and this provider participated in today's telepsychological service.   This provider conducted a brief check-in. Tamisha shared about her appointment with Dr. Raliegh Scarlet, noting she maintained her weight during the holidays. She described an improvement with her eating habits; however, discussed stress due to her friend undergoing medical treatments and worry about church friends. Associated thoughts and feelings were briefly processed. Reviewed learned skills. Overall, Wilda was receptive to today's appointment as evidenced by openness to sharing, responsiveness to feedback, and willingness to implement discussed strategies .  Mental Status Examination:  Appearance: neat Behavior: appropriate to circumstances Mood: neutral Affect: mood congruent Speech: WNL Eye Contact: appropriate Psychomotor Activity: WNL Gait: unable to assess Thought Process: linear, logical, and goal directed and no evidence or endorsement of suicidal, homicidal, and self-harm ideation, plan and  intent  Thought Content/Perception: no hallucinations, delusions, bizarre thinking or behavior endorsed or observed Orientation: AAOx4 Memory/Concentration: memory, attention, language, and fund of knowledge intact  Insight: good Judgment: good  Interventions:  Conducted a brief chart review Provided empathic reflections and validation Provided positive reinforcement Employed supportive psychotherapy interventions to facilitate reduced distress and to improve coping skills with identified stressors Reviewed learned skills  DSM-5 Diagnosis(es):  F50.89 Other Specified Feeding or Eating Disorder, Emotional Eating Behaviors and F43.20 Adjustment Disorder, Unspecified   Treatment Goal & Progress: During the initial appointment with this provider, the following treatment goal was established: increase coping skills. [name] demonstrated progress in her goal as evidenced by increased awareness of hunger patterns, increased awareness of triggers for emotional eating behaviors, and reduction in emotional eating behaviors . Tricha also continues to demonstrate willingness to engage in learned skill(s).  Plan: As previously planned, today was Primrose's last appointment with this provider. She acknowledged understanding that she may request a follow-up appointment with this provider in the future as long as she is still established with the clinic. Ainslie will be initiating therapeutic services with [facility] on 03/01/2022. No further follow-up planned by this provider.

## 2022-01-26 ENCOUNTER — Ambulatory Visit (INDEPENDENT_AMBULATORY_CARE_PROVIDER_SITE_OTHER): Payer: Medicare HMO | Admitting: Family Medicine

## 2022-02-01 DIAGNOSIS — G4733 Obstructive sleep apnea (adult) (pediatric): Secondary | ICD-10-CM | POA: Diagnosis not present

## 2022-02-01 DIAGNOSIS — E559 Vitamin D deficiency, unspecified: Secondary | ICD-10-CM | POA: Diagnosis not present

## 2022-02-01 DIAGNOSIS — M1711 Unilateral primary osteoarthritis, right knee: Secondary | ICD-10-CM | POA: Diagnosis not present

## 2022-02-01 DIAGNOSIS — E782 Mixed hyperlipidemia: Secondary | ICD-10-CM | POA: Diagnosis not present

## 2022-02-01 DIAGNOSIS — R7303 Prediabetes: Secondary | ICD-10-CM | POA: Diagnosis not present

## 2022-02-01 DIAGNOSIS — R600 Localized edema: Secondary | ICD-10-CM | POA: Diagnosis not present

## 2022-02-01 DIAGNOSIS — E669 Obesity, unspecified: Secondary | ICD-10-CM | POA: Diagnosis not present

## 2022-02-01 DIAGNOSIS — N182 Chronic kidney disease, stage 2 (mild): Secondary | ICD-10-CM | POA: Diagnosis not present

## 2022-02-01 DIAGNOSIS — D0510 Intraductal carcinoma in situ of unspecified breast: Secondary | ICD-10-CM | POA: Diagnosis not present

## 2022-02-06 NOTE — Progress Notes (Unsigned)
Chief Complaint:   OBESITY Cheyenne Mcdonald is here to discuss her progress with her obesity treatment plan along with follow-up of her obesity related diagnoses. Cheyenne Mcdonald is on keeping a food journal and adhering to recommended goals of 1000-1100 calories and 80+ grams protein and states she is following her eating plan approximately 50% of the time. Cheyenne Mcdonald states she is walking 30 minutes 1 times per week.  Today's visit was #: 10 Starting weight: 220 lbs Starting date: 07/27/2021 Today's weight: 212 lbs Today's date: 01/20/2022 Total lbs lost to date: 8 Total lbs lost since last in-office visit: 0  Interim History: Cheyenne Mcdonald did well over the holidays and has been weight neutral since 11/29. Biometrics show increase in muscle mass and decrease in fat mass. Education provided. Here to review labs drawn last OV.  Subjective:   1. Prediabetes Discussed labs with patient today. Fasting insulin and A1c are elevated. Pt declines medications and wants to focus on diet, exercise, and weight loss.  2. Vitamin D deficiency Discussed labs with patient today. Cheyenne Mcdonald's Vitamin D level is between 50-60 and at goal.  3. B12 deficiency Discussed labs with patient today. Cheyenne Mcdonald's B12 level went from 328 to 860. She is on OTC B12 supplement (amount unknown).  Assessment/Plan:  No orders of the defined types were placed in this encounter.   Medications Discontinued During This Encounter  Medication Reason   prochlorperazine (COMPAZINE) 10 MG tablet      No orders of the defined types were placed in this encounter.    1. Prediabetes A1c went from 5.9 to 6.0 now and fasting insulin worse from 16.9 to 17.6.  Follow prudent nutritional plan, weight loss, and increase exercise.   2. Vitamin D deficiency Vitamin D level at goal. Continue OTC Vit D3 5,000 IU. Bone density scan every 2 years.  3. B12 deficiency B12 at goal. Pt is to continue current supplementation at 500 mcg and continue prudent nutritional  plan.  4. Obesity, Current BMI 37.7 Cheyenne Mcdonald is currently in the action stage of change. As such, her goal is to continue with weight loss efforts. She has agreed to the Category 1 Plan and keeping a food journal and adhering to recommended goals of 1000-1100 calories and 80+ grams protein.   Meal prep/plan on Sundays.  Exercise goals:  Walk 5 days a week for 30 minutes (M, W, F), plus water aerobics 2 days a week.  Behavioral modification strategies: meal planning and cooking strategies and planning for success.  Cheyenne Mcdonald has agreed to follow-up with our clinic in 4 weeks. She was informed of the importance of frequent follow-up visits to maximize her success with intensive lifestyle modifications for her multiple health conditions.   Objective:   Blood pressure 125/79, pulse 73, temperature 98 F (36.7 C), height '5\' 3"'$  (1.6 m), weight 212 lb 9.6 oz (96.4 kg), SpO2 100 %. Body mass index is 37.66 kg/m.  General: Cooperative, alert, well developed, in no acute distress. HEENT: Conjunctivae and lids unremarkable. Cardiovascular: Regular rhythm.  Lungs: Normal work of breathing. Neurologic: No focal deficits.   Lab Results  Component Value Date   CREATININE 1.09 (H) 12/08/2021   BUN 21 12/08/2021   NA 141 12/08/2021   K 4.1 12/08/2021   CL 106 12/08/2021   CO2 22 12/08/2021   Lab Results  Component Value Date   ALT 10 12/08/2021   AST 17 12/08/2021   ALKPHOS 54 12/08/2021   BILITOT 0.2 12/08/2021   Lab Results  Component Value Date   HGBA1C 6.0 (H) 12/08/2021   HGBA1C 5.9 (H) 07/27/2021   Lab Results  Component Value Date   INSULIN 17.6 12/08/2021   INSULIN 16.9 07/27/2021   Lab Results  Component Value Date   TSH 2.200 07/27/2021   Lab Results  Component Value Date   CHOL 135 12/08/2021   HDL 49 12/08/2021   LDLCALC 73 12/08/2021   TRIG 62 12/08/2021   Lab Results  Component Value Date   VD25OH 67.3 12/08/2021   VD25OH 55.4 07/27/2021   Lab Results   Component Value Date   WBC 3.6 (L) 08/31/2021   HGB 13.2 08/31/2021   HCT 37.5 08/31/2021   MCV 89.9 08/31/2021   PLT 150 08/31/2021    Attestation Statements:   Reviewed by clinician on day of visit: allergies, medications, problem list, medical history, surgical history, family history, social history, and previous encounter notes.  Time spent on visit including pre-visit chart review and post-visit care and charting was 30 minutes.   I, Kathlene November, BS, CMA, am acting as transcriptionist for [employer], DO.   I have reviewed the above documentation for accuracy and completeness, and I agree with the above. Cheyenne Mcdonald, D.O.  The [place] was signed into law in 2016 which includes the topic of electronic health records.  This provides immediate access to information in MyChart.  This includes consultation notes, operative notes, office notes, lab results and pathology reports.  If you have any questions about what you read please let us know at your next visit so we can discuss your concerns and take corrective action if need be.  We are right here with you.

## 2022-02-10 ENCOUNTER — Encounter (INDEPENDENT_AMBULATORY_CARE_PROVIDER_SITE_OTHER): Payer: Self-pay | Admitting: Family Medicine

## 2022-02-10 ENCOUNTER — Ambulatory Visit (INDEPENDENT_AMBULATORY_CARE_PROVIDER_SITE_OTHER): Payer: Medicare HMO | Admitting: Family Medicine

## 2022-02-10 ENCOUNTER — Telehealth: Payer: Self-pay | Admitting: Hematology and Oncology

## 2022-02-10 VITALS — BP 138/78 | HR 89 | Temp 98.1°F | Ht 63.0 in | Wt 214.2 lb

## 2022-02-10 DIAGNOSIS — F4389 Other reactions to severe stress: Secondary | ICD-10-CM

## 2022-02-10 DIAGNOSIS — F4320 Adjustment disorder, unspecified: Secondary | ICD-10-CM

## 2022-02-10 DIAGNOSIS — Z6837 Body mass index (BMI) 37.0-37.9, adult: Secondary | ICD-10-CM | POA: Insufficient documentation

## 2022-02-10 NOTE — Telephone Encounter (Signed)
Contacted patient to scheduled appointments. Patient is aware of appointments that are scheduled.   

## 2022-02-18 ENCOUNTER — Telehealth: Payer: Self-pay

## 2022-02-18 NOTE — Patient Outreach (Signed)
  Care Coordination   Initial Visit Note   02/18/2022 Name: Cheyenne Mcdonald MRN: [id number] DOB: 12-06-1950  Cheyenne Mcdonald is a 72 y.o. year old female who sees Trey Sailors, [place] for primary care. I spoke with  Cheyenne Mcdonald by phone today.  What matters to the patients health and wellness today?  Ms. Cecere reports she is doing well. She denies any disease management, care coordination or resource needs at this time.    Goals Addressed             This Visit's Progress    COMPLETED: Care Coordination Activities       Care Coordination Interventions: Discussed care coordination program Encouraged to contact RNCM if care coordination needs in the future. RNCM provided contact number.       SDOH assessments and interventions completed:  Yes  SDOH Interventions Today    Flowsheet Row Most Recent Value  SDOH Interventions   Food Insecurity Interventions Intervention Not Indicated  Housing Interventions Intervention Not Indicated  Transportation Interventions Intervention Not Indicated      Care Coordination Interventions:  Yes, provided   Follow up plan: No further intervention required.   Encounter Outcome:  Pt. Visit Completed   Thea Silversmith, RN, MSN, BSN, [place] Coordinator [phone]

## 2022-02-21 DIAGNOSIS — F4320 Adjustment disorder, unspecified: Secondary | ICD-10-CM | POA: Insufficient documentation

## 2022-02-28 ENCOUNTER — Ambulatory Visit (INDEPENDENT_AMBULATORY_CARE_PROVIDER_SITE_OTHER): Payer: Medicare HMO | Admitting: Psychology

## 2022-02-28 DIAGNOSIS — F4322 Adjustment disorder with anxiety: Secondary | ICD-10-CM | POA: Diagnosis not present

## 2022-02-28 NOTE — Progress Notes (Signed)
[facility] Counselor Initial Adult Exam  Name: Cheyenne Mcdonald Date: 02/28/2022 MRN: [id number] DOB: Jun 17, 1950 PCP: Trey Sailors, [place]  Time spent: 11:00am-11:50am    50 minutes  Guardian/Payee:  Crista Curb requested: No   Reason for Visit /Presenting Problem: Pt present for face-to-face initial assessment via video Webex.  Pt consents to telehealth video session due to COVID 19 pandemic. Location of pt: home Location of therapist: home office.  Pt is 72 yo and has 2 children and 4 grandchildren.    Pt recently learned that her grand daughter is lesbian and is considering being Trans.  Pt's grand daughter states she has never felt like a girl.  Pt worries about her grand daughter and is having trouble accepting her choices.  Pt's grand daughter lives in [place] and comes home 2-3 times a year.   Pt has 3 grandsons and this is her only grand daughter and pt does not think she can ever refer to her as a female.  Pt's religious beliefs do not support her grand daughter's decisions.  Pt states her biggest fear is sharing it with others.  Pt also worries about her grand daughter's health and safety.   Pt is worried about her husband who is showing signs of cognitive decline.  Pt was hoping to travel in her retirement but her husband does not go and do things much which is disappointing to her.   Pt is a 2 year breast cancer survivor.   Pt has a 60 yo mother who she looks out for.  Pt tends to cope by emotionally eating and would like to learn better coping skills.    Mental Status Exam: Appearance:   Casual     Behavior:  Appropriate  Motor:  Normal  Speech/Language:   Normal Rate  Affect:  Appropriate  Mood:  normal  Thought process:  normal  Thought content:    WNL  Sensory/Perceptual disturbances:    WNL  Orientation:  oriented to person, place, time/date, and situation  Attention:  Good  Concentration:  Good  Memory:  WNL  Fund of knowledge:   Good   Insight:    Good  Judgment:   Good  Impulse Control:  Good     Reported Symptoms:  worrying , nervous at times.   Risk Assessment: Danger to Self:  No Self-injurious Behavior: No Danger to Others: No Duty to Warn:no Physical Aggression / Violence:No  Access to Firearms a concern: No  Gang Involvement:No  Patient / guardian was educated about steps to take if suicide or homicide risk level increases between visits: n/a While future psychiatric events cannot be accurately predicted, the patient does not currently require acute inpatient psychiatric care and does not currently meet [facility] involuntary commitment criteria.  Substance Abuse History: Current substance abuse: No     Past Psychiatric History:   No previous psychological problems have been observed Outpatient Providers:this is pt's first time in therapy.  History of Psych Hospitalization: No  Psychological Testing:  n/a    Abuse History:  Victim of: No.,  n/a    Report needed: No. Victim of Neglect:No. Perpetrator of  n/a   Witness / Exposure to Domestic Violence: No   Protective Services Involvement: No  Witness to [employer] Violence:  No   Family History:  Family History  Problem Relation Age of Onset   High Cholesterol Mother    Breast cancer Cousin 78  maternal cousin; bilateral     Living situation: the patient lives with her husband.  Pt grew up with parents and 2 younger brothers.  Both parents were teachers.  Family history of mental health issues:  none noted. No childhood abuse.    Sexual Orientation: Straight  Relationship Status: married for 50 years.  Name of spouse / other:[name] If a parent, number of children / ages:pt has 2 adult children.    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   Religion/Sprituality/World View: Protestant  Any cultural differences that may affect / interfere with treatment:  not applicable   Recreation/Hobbies: reading, BSF  Stressors: Other: concerns about her grand daughter and husband.      Strengths: Supportive Relationships, Church, Spirituality, Hopefulness, Self Advocate, and Able to Communicate Effectively  Barriers:  none   Legal History: Pending legal issue / charges: The patient has no significant history of legal issues. History of legal issue / charges:  n/a  Medical History/Surgical History: reviewed Past Medical History:  Diagnosis Date   Anemia    Arthritis    gout big toe   Breast cancer ([place])    left breast IDC, right breast DCIS   History of radiation therapy 07/20/2020   bilateral breasts 06/01/2020-07/20/2020  Dr Gery Pray   Hypertension    Kidney problem    Obesity    Personal history of chemotherapy    2022 bilat lumpectomies w/rad w/chemo   Personal history of radiation therapy    2022 bilat lumpectomies w/rad w/chemo   Prediabetes    Sleep apnea    does not use CPAP   Vitamin D deficiency     Past Surgical History:  Procedure Laterality Date   ABDOMINAL HYSTERECTOMY     BREAST LUMPECTOMY Bilateral    2022 bilat lumpectomies w/rad w/chemo   BREAST LUMPECTOMY WITH RADIOACTIVE SEED AND SENTINEL LYMPH NODE BIOPSY Left 04/16/2020   Procedure: LEFT BREAST LUMPECTOMY WITH RADIOACTIVE SEED AND LEFT AXILLARY SENTINEL LYMPH NODE BIOPSY;  Surgeon: [name], MD;  Location: [place] [facility];  Service: General;  Laterality: Left;   BREAST LUMPECTOMY WITH RADIOACTIVE SEED LOCALIZATION Right 04/16/2020   Procedure: RIGHT BREAST LUMPECTOMY WITH RADIOACTIVE SEED LOCALIZATION;  Surgeon: [name], MD;  Location: [place];  Service: General;  Laterality: Right;   IR IMAGING GUIDED PORT INSERTION  10/18/2019   PORT-A-CATH REMOVAL Right 09/02/2020   Procedure:  REMOVAL PORT-A-CATH;  Surgeon: [name], MD;  Location: [place];  Service: General;  Laterality: Right;    Medications: Current Outpatient Medications  Medication Sig Dispense Refill   allopurinol (ZYLOPRIM) 100 MG tablet Take 100 mg by mouth daily.     Cholecalciferol (VITAMIN D-3) 125 MCG (5000 UT) TABS 1 cap(s)     cyanocobalamin 500 MCG TABS 300-574mg qd     tamoxifen (NOLVADEX) 20 MG tablet Take 1 tablet (20 mg total) by mouth daily. 90 tablet 3   No current facility-administered medications for this visit.    Allergies  Allergen Reactions   Shellfish Allergy Itching    Diagnoses:  F43.22  Plan of Care: Recommend ongoing therapy.  Pt participated in setting treatment goals.  Pt states her goals are to have a safe place to talk and to work on acceptance and to improve coping skills.  Plan to meet every two weeks.   Treatment Plan  (target date: 03/01/2023) Client Abilities/Strengths  Pt is bright, engaging and motivated for therapy.  Client Treatment Preferences  Individual therapy.  Client Statement of Needs  Improve coping skills.  Symptoms  Excessive and/or unrealistic worry that is difficult to control occurring more days than not for at least 6 months about a number of events or activities.  Problems Addressed  Anxiety Goals 1. Enhance ability to effectively cope with the full variety of life's worries and anxieties. 2. Learn and implement coping skills that result in a reduction of anxiety and worry, and improved daily functioning. Objective Learn to accept limitations in life and commit to tolerating, rather than avoiding, unpleasant emotions while accomplishing meaningful goals. Target Date: 2023-03-01 Frequency: Biweekly Progress: 10 Modality: individual Related Interventions 1. Use techniques from Acceptance and Commitment Therapy to help client accept uncomfortable realities such as lack of complete control, imperfections, and  uncertainty and tolerate unpleasant emotions and thoughts in order to accomplish value-consistent goals. Objective Learn and implement problem-solving strategies for realistically addressing worries. Target Date: 2023-03-01 Frequency: Biweekly Progress: 10 Modality: individual Related Interventions 1. Assign the client a homework exercise in which he/she problem-solves a current problem.  review, reinforce success, and provide corrective feedback toward improvement. 2. Teach the client problem-solving strategies involving specifically defining a problem, generating options for addressing it, evaluating the pros and cons of each option, selecting and implementing an optional action, and reevaluating and refining the action. Objective Learn and implement calming skills to reduce overall anxiety and manage anxiety symptoms. Target Date: 2023-03-01 Frequency: Biweekly Progress: 10 Modality: individual Related Interventions 1. Assign the client to read about progressive muscle relaxation and other calming strategies in relevant books or treatment manuals (e.g., Progressive Relaxation Training by Gwynneth Aliment and Dani Gobble; Mastery of Your Anxiety and Worry: Workbook by Beckie Busing). 2. Assign the client homework each session in which he/she practices relaxation exercises daily, gradually applying them progressively from non-anxiety-provoking to anxiety-provoking situations; review and reinforce success while providing corrective feedback toward improvement. 3. Teach the client calming/relaxation skills (e.g., applied relaxation, progressive muscle relaxation, cue controlled relaxation; mindful breathing; biofeedback) and how to discriminate better between relaxation and tension; teach the client how to apply these skills to his/her daily life. 3. Reduce overall frequency, intensity, and duration of the anxiety so that daily functioning is not impaired. 4. Resolve the core conflict that is the source of  anxiety. 5. Stabilize anxiety level while increasing ability to function on a daily basis. Diagnosis :    F43.22 Conditions For Discharge Achievement of treatment goals and objectives.     Proctor Carriker, LCSW

## 2022-03-01 ENCOUNTER — Ambulatory Visit: Payer: Medicare HMO | Admitting: Psychology

## 2022-03-02 NOTE — Progress Notes (Signed)
Cheyenne Mcdonald, D.O.  ABFM, ABOM Specializing in Clinical Bariatric Medicine  Office located at: [address], [place]  [address]     Chief Complaint:   OBESITY Cheyenne Mcdonald is here to discuss her progress with her obesity treatment plan along with follow-up of her obesity related diagnoses. Cheyenne Mcdonald is on keeping a food journal and adhering to recommended goals of 1000-1100 calories and 80+ protein and states she is following her eating plan approximately 65% of the time. Cheyenne Mcdonald states she is not exercising.  Today's visit was #: 52 Starting weight: 220 LBS Starting date: 07/27/2021 Today's weight: 214 LBS Today's date: 02/10/2022 Total lbs lost to date: 6 LBS Total lbs lost since last in-office visit: +2 LBS  Interim History: Patient with more family, personal stressors and has not been able to focus on herself.  She always does for others...recently a church member died and has been helping their family with that etc.  Patient gained 2 LBS and has not journaled.  Subjective:   1. 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   -More stress lately with finding out her granddaughter in [place] is gay, but she also thinks she needs a gender change.  A lot of emotional turmoil for pt -She can skip meals or grab an unhealthy bite out etc.   - Patient declines need for medications and denies depression with stressors of granddaughter and other stressors.   Assessment/Plan:   1. 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   - Dr. Mallie Mussel released patient recently and recommended for pt to obtain a general counselor at this time especially one specializing in LGBTQ issues.    2. BMI 37.0-37.9, adult-current bmi 37.9  3. Morbid obesity (HCC)-start bmi 38.97 Goals for  next OV: 1.  Focus on dinner and following plan for that 1 meal. 2.  Walk 2 days/week and 1 hour for stress management. 3.  Obtain counseling, suggestions given for her pastor.  Cheyenne Mcdonald is currently in the action stage of change. As such, her goal is to continue with weight loss efforts. She has agreed to keeping a food journal and adhering to recommended goals of 1000-1100 calories and 80+ protein.   Exercise goals: All adults should avoid inactivity. Some physical activity is better than none, and adults who participate in any amount of physical activity gain some health benefits.  Behavioral modification strategies: increasing lean protein intake, keeping healthy foods in the home, emotional eating strategies, and planning for success.  Cheyenne Mcdonald has agreed to follow-up with our clinic in 3 weeks. She was informed of the importance of frequent follow-up visits to maximize her success with intensive lifestyle modifications for her multiple health conditions.   Objective:   Blood pressure 138/78, pulse 89, temperature 98.1 F (36.7 C), height '5\' 3"'$  (1.6 m), weight 214 lb 3.2 oz (97.2 kg), SpO2 97 %. Body mass index is 37.94 kg/m. General: Cooperative, alert, well developed, in no acute distress. HEENT: Conjunctivae and lids unremarkable. Cardiovascular: Regular rhythm.  Lungs: Normal work of breathing. Neurologic: No focal deficits.   Lab Results  Component Value Date   CREATININE 1.09 (H) 12/08/2021   BUN 21 12/08/2021   NA 141 12/08/2021   K 4.1 12/08/2021   CL 106 12/08/2021   CO2  22 12/08/2021   Lab Results  Component Value Date   ALT 10 12/08/2021   AST 17 12/08/2021   ALKPHOS 54 12/08/2021   BILITOT 0.2 12/08/2021   Lab Results  Component Value Date   HGBA1C 6.0 (H) 12/08/2021   HGBA1C 5.9 (H) 07/27/2021   Lab Results  Component Value Date   INSULIN 17.6 12/08/2021   INSULIN 16.9 07/27/2021   Lab Results  Component Value Date   TSH 2.200 07/27/2021   Lab Results   Component Value Date   CHOL 135 12/08/2021   HDL 49 12/08/2021   LDLCALC 73 12/08/2021   TRIG 62 12/08/2021   Lab Results  Component Value Date   VD25OH 67.3 12/08/2021   VD25OH 55.4 07/27/2021   Lab Results  Component Value Date   WBC 3.6 (L) 08/31/2021   HGB 13.2 08/31/2021   HCT 37.5 08/31/2021   MCV 89.9 08/31/2021   PLT 150 08/31/2021   No results found for: "IRON", "TIBC", "FERRITIN"  Attestation Statements:   Reviewed by clinician on day of visit: allergies, medications, problem list, medical history, surgical history, family history, social history, and previous encounter notes.  Time spent on visit including pre-visit chart review and post-visit care and charting was 30 minutes.  I, Davy Pique, RMA, am acting as Location manager for [employer], DO.  I have reviewed the above documentation for completeness, and I agree with the above. Cheyenne Mcdonald, D.O.  The [place] was signed into law in 2016 which includes the topic of electronic health records.  This provides immediate access to information in MyChart.  This includes consultation notes, operative notes, office notes, lab results and pathology reports.  If you have any questions about what you read please let us know at your next visit so we can discuss your concerns and take corrective action if need be.  We are right here with you.

## 2022-03-03 ENCOUNTER — Other Ambulatory Visit: Payer: Self-pay

## 2022-03-03 ENCOUNTER — Inpatient Hospital Stay: Payer: Medicare HMO

## 2022-03-03 ENCOUNTER — Ambulatory Visit: Payer: Medicare HMO | Admitting: Hematology and Oncology

## 2022-03-03 ENCOUNTER — Inpatient Hospital Stay: Payer: Medicare HMO | Attending: Hematology and Oncology | Admitting: Hematology and Oncology

## 2022-03-03 VITALS — BP 128/75 | HR 99 | Temp 97.7°F | Resp 16 | Ht 63.0 in | Wt 218.7 lb

## 2022-03-03 DIAGNOSIS — Z9221 Personal history of antineoplastic chemotherapy: Secondary | ICD-10-CM | POA: Insufficient documentation

## 2022-03-03 DIAGNOSIS — Z923 Personal history of irradiation: Secondary | ICD-10-CM | POA: Diagnosis not present

## 2022-03-03 DIAGNOSIS — C50412 Malignant neoplasm of upper-outer quadrant of left female breast: Secondary | ICD-10-CM

## 2022-03-03 DIAGNOSIS — Z79810 Long term (current) use of selective estrogen receptor modulators (SERMs): Secondary | ICD-10-CM | POA: Insufficient documentation

## 2022-03-03 DIAGNOSIS — D0511 Intraductal carcinoma in situ of right breast: Secondary | ICD-10-CM | POA: Insufficient documentation

## 2022-03-03 DIAGNOSIS — Z171 Estrogen receptor negative status [ER-]: Secondary | ICD-10-CM

## 2022-03-03 LAB — CBC WITH DIFFERENTIAL/PLATELET
Abs Immature Granulocytes: 0.01 10*3/uL (ref 0.00–0.07)
Basophils Absolute: 0 10*3/uL (ref 0.0–0.1)
Basophils Relative: 1 %
Eosinophils Absolute: 0.1 10*3/uL (ref 0.0–0.5)
Eosinophils Relative: 2 %
HCT: 38.7 % (ref 36.0–46.0)
Hemoglobin: 13.4 g/dL (ref 12.0–15.0)
Immature Granulocytes: 0 %
Lymphocytes Relative: 25 %
Lymphs Abs: 1.1 10*3/uL (ref 0.7–4.0)
MCH: 31.9 pg (ref 26.0–34.0)
MCHC: 34.6 g/dL (ref 30.0–36.0)
MCV: 92.1 fL (ref 80.0–100.0)
Monocytes Absolute: 0.5 10*3/uL (ref 0.1–1.0)
Monocytes Relative: 12 %
Neutro Abs: 2.7 10*3/uL (ref 1.7–7.7)
Neutrophils Relative %: 60 %
Platelets: 159 10*3/uL (ref 150–400)
RBC: 4.2 MIL/uL (ref 3.87–5.11)
RDW: 12.5 % (ref 11.5–15.5)
WBC: 4.4 10*3/uL (ref 4.0–10.5)
nRBC: 0 % (ref 0.0–0.2)

## 2022-03-03 LAB — COMPREHENSIVE METABOLIC PANEL
ALT: 13 U/L (ref 0–44)
AST: 16 U/L (ref 15–41)
Albumin: 4.1 g/dL (ref 3.5–5.0)
Alkaline Phosphatase: 50 U/L (ref 38–126)
Anion gap: 6 (ref 5–15)
BUN: 28 mg/dL — ABNORMAL HIGH (ref 8–23)
CO2: 26 mmol/L (ref 22–32)
Calcium: 9.4 mg/dL (ref 8.9–10.3)
Chloride: 107 mmol/L (ref 98–111)
Creatinine, Ser: 1.23 mg/dL — ABNORMAL HIGH (ref 0.44–1.00)
GFR, Estimated: 47 mL/min — ABNORMAL LOW (ref >60)
Glucose, Bld: 96 mg/dL (ref 70–99)
Potassium: 3.9 mmol/L (ref 3.5–5.1)
Sodium: 139 mmol/L (ref 135–145)
Total Bilirubin: 0.4 mg/dL (ref 0.3–1.2)
Total Protein: 6.9 g/dL (ref 6.5–8.1)

## 2022-03-03 NOTE — Progress Notes (Signed)
[facility]  [phone]) [phone] Fax:([phone]) [phone]    ID: Cheyenne Mcdonald DOB: [birth year]  MR#: [id number]  [id number]  Patient Care Team: Trey Sailors, PA as PCP - General (Physician Assistant) Servando Salina, MD as Consulting Physician (Obstetrics and Gynecology) Mauro Kaufmann, RN as Oncology Nurse Navigator Rockwell Germany, RN as Oncology Nurse Navigator [name], MD as Consulting Physician (General Surgery) Gery Pray, MD as Consulting Physician (Radiation Oncology) Dimas Aguas, MD as Consulting Physician (Nephrology) Benay Pike, MD as Consulting Physician (Hematology and Oncology) Benay Pike, MD   CHIEF COMPLAINT: 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   She is doing amazingly well.  She says she doesn't feel like she never had cancer She denies any complaints at all. She had some good questions about MRI breast screening. Rest of the pertinent 10 point ROS reviewed and negative  COVID 19 VACCINATION STATUS: Status post [place] x2 with booster October 2021; also had COVID July 2022   HISTORY OF CURRENT ILLNESS: From the original intake note:  Cheyenne Mcdonald presented for her routine mammography with a palpable left breast lump. She underwent bilateral diagnostic mammography with tomography and left breast ultrasonography at [facility] on 09/13/2019 showing: breast density category B; two adjacent masses in left breast at 1 o'clock, spanning approximately 3.3 cm; one indeterminate left axillary lymph node with 0.4 cm cortical bulge; no evidence right breast malignancy.  Accordingly on 09/27/2019 she proceeded to biopsy of the left breast areas in question. The pathology from this procedure ([id number]) showed: invasive ductal carcinoma,  grade 3, present in both of the adjacent masses. Prognostic indicators significant for: estrogen receptor, 0% negative and progesterone receptor, 0% negative. Proliferation marker Ki67 at 90%. HER2 equivocal by immunohistochemistry (2+), but negative by fluorescent in situ hybridization with a signals ratio 2.2 and number per cell 3.3. Additional analysis of more cells showed a signals ratio 2.05 and number per cell 3.13.  The biopsied lymph node was negative for carcinoma.  This was felt to be concordant  The patient's subsequent history is as detailed below.   PAST MEDICAL HISTORY: Past Medical History:  Diagnosis Date   Anemia    Arthritis    gout big toe   Breast cancer ([place])    left breast IDC, right breast DCIS   History of radiation therapy 07/20/2020   bilateral breasts 06/01/2020-07/20/2020  Dr Gery Pray   Hypertension    Kidney problem    Obesity    Personal history of chemotherapy    2022 bilat lumpectomies w/rad w/chemo   Personal history of radiation therapy    2022 bilat lumpectomies w/rad w/chemo   Prediabetes    Sleep apnea    does not use CPAP   Vitamin D deficiency   heart murmur (since childhood)   PAST SURGICAL HISTORY: Past Surgical History:  Procedure Laterality Date   ABDOMINAL HYSTERECTOMY     BREAST LUMPECTOMY Bilateral    2022 bilat lumpectomies w/rad w/chemo   BREAST LUMPECTOMY WITH RADIOACTIVE SEED AND SENTINEL LYMPH NODE BIOPSY Left 04/16/2020   Procedure: LEFT BREAST LUMPECTOMY WITH RADIOACTIVE SEED AND LEFT AXILLARY SENTINEL LYMPH NODE BIOPSY;  Surgeon: [name], MD;  Location: [facility];  Service: General;  Laterality: Left;   BREAST LUMPECTOMY WITH RADIOACTIVE SEED LOCALIZATION Right 04/16/2020   Procedure: RIGHT  BREAST LUMPECTOMY WITH RADIOACTIVE SEED LOCALIZATION;  Surgeon: [name], MD;  Location: [facility];  Service: General;  Laterality: Right;   IR IMAGING GUIDED PORT INSERTION   10/18/2019   PORT-A-CATH REMOVAL Right 09/02/2020   Procedure: REMOVAL PORT-A-CATH;  Surgeon: [name], MD;  Location: [facility];  Service: General;  Laterality: Right;    FAMILY HISTORY: Family History  Problem Relation Age of Onset   High Cholesterol Mother    Breast cancer Cousin 38       maternal cousin; bilateral    Her father died at age 96, cause of death unknown. Her mother is age 36 as of 09/2019. Tinsleigh has two brothers (and no sisters). She reports one cousin with breast cancer at age 5.   GYNECOLOGIC HISTORY:  No LMP recorded. Patient has had a hysterectomy. Menarche: 72 years old Age at first live birth: 72 years old [place] P 2 LMP 1990 Contraceptive: used for maybe 20 years HRT: never used  Hysterectomy? Yes, 1990 BSO? 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 Husband Cheyenne Mcdonald is retired Nature conservation officer and then retired Civil Service fast streamer.  At home is just the 2 of them. Daughter Cheyenne Mcdonald, age 24, works in [place] entry in [employer]. Son Cheyenne Mcdonald, age 61, has a college degree but works for [employer] in [employer]. Reshonda has four grandchildren. She attends [employer].    ADVANCED DIRECTIVES: In place   HEALTH MAINTENANCE: Social History   Tobacco Use   Smoking status: Never   Smokeless tobacco: Never  Substance Use Topics   Alcohol use: Never   Drug use: Never     Colonoscopy: 2018 (Dr. Collene Mares)  PAP: approx. 2013  Bone density: 2016, "normal"   Allergies  Allergen Reactions   Shellfish Allergy Itching    Current Outpatient Medications  Medication Sig Dispense Refill   allopurinol (ZYLOPRIM) 100 MG tablet Take 100 mg by mouth daily.     Cholecalciferol (VITAMIN D-3) 125 MCG (5000 UT) TABS 1 cap(s)     cyanocobalamin 500 MCG TABS 300-580mg qd     tamoxifen (NOLVADEX) 20 MG tablet Take 1 tablet (20 mg total) by mouth daily. 90 tablet 3   No current facility-administered  medications for this visit.    OBJECTIVE: African American woman who appears younger than stated age  V16   03/03/22 0956  BP: 128/75  Pulse: 99  Resp: 16  Temp: 97.7 F (36.5 C)  SpO2: 99%      Body mass index is 38.74 kg/m.   Wt Readings from Last 3 Encounters:  03/03/22 218 lb 11.2 oz (99.2 kg)  02/10/22 214 lb 3.2 oz (97.2 kg)  01/20/22 212 lb 9.6 oz (96.4 kg)     ECOG FS:1 - Symptomatic but completely ambulatory  Physical Exam Constitutional:      General: She is not in acute distress.    Appearance: Normal appearance.  HENT:     Head: Normocephalic and atraumatic.  Chest:     Comments: Bilateral breast status post lumpectomies.  Postsurgical changes, otherwise no palpable masses or regional adenopathy. Musculoskeletal:     Cervical back: Normal range of motion and neck supple. No rigidity.  Skin:    General: Skin is warm and dry.  Neurological:     Mental Status: She is alert.       LAB RESULTS:  CMP     Component Value Date/Time  NA 141 12/08/2021 0906   K 4.1 12/08/2021 0906   CL 106 12/08/2021 0906   CO2 22 12/08/2021 0906   GLUCOSE 93 12/08/2021 0906   GLUCOSE 117 (H) 08/31/2021 1427   BUN 21 12/08/2021 0906   CREATININE 1.09 (H) 12/08/2021 0906   CREATININE 1.17 (H) 08/31/2021 1427   CALCIUM 9.4 12/08/2021 0906   PROT 6.3 12/08/2021 0906   ALBUMIN 4.1 12/08/2021 0906   AST 17 12/08/2021 0906   AST 15 08/31/2021 1427   ALT 10 12/08/2021 0906   ALT 14 08/31/2021 1427   ALKPHOS 54 12/08/2021 0906   BILITOT 0.2 12/08/2021 0906   BILITOT 0.4 08/31/2021 1427   GFRNONAA 50 (L) 08/31/2021 1427   GFRAA 58 (L) 10/09/2019 0806    No results found for: "TOTALPROTELP", "ALBUMINELP", "A1GS", "A2GS", "BETS", "BETA2SER", "GAMS", "MSPIKE", "SPEI"  Lab Results  Component Value Date   WBC 3.6 (L) 08/31/2021   NEUTROABS 1.8 08/31/2021   HGB 13.2 08/31/2021   HCT 37.5 08/31/2021   MCV 89.9 08/31/2021   PLT 150 08/31/2021    No results found  for: "LABCA2"  No components found for: "[id number]"  No results for input(s): "INR" in the last 168 hours.  No results found for: "LABCA2"  No results found for: "[id number]"  No results found for: "CAN125"  No results found for: "CAN153"  No results found for: "CA2729"  No components found for: "HGQUANT"  No results found for: "CEA1", "CEA" / No results found for: "CEA1", "CEA"   No results found for: "AFPTUMOR"  No results found for: "CHROMOGRNA"  No results found for: "KPAFRELGTCHN", "LAMBDASER", "KAPLAMBRATIO" (kappa/lambda light chains)  No results found for: "HGBA", "HGBA2QUANT", "HGBFQUANT", "HGBSQUAN" (Hemoglobinopathy evaluation)   No results found for: "LDH"  No results found for: "IRON", "TIBC", "IRONPCTSAT" (Iron and TIBC)  No results found for: "FERRITIN"  Urinalysis No results found for: "COLORURINE", "APPEARANCEUR", "LABSPEC", "PHURINE", "GLUCOSEU", "HGBUR", "BILIRUBINUR", "KETONESUR", "PROTEINUR", "UROBILINOGEN", "NITRITE", "LEUKOCYTESUR"   STUDIES: No results found.   ELIGIBLE FOR AVAILABLE RESEARCH PROTOCOL: AET  ASSESSMENT: 72 y.o. [place] woman status post left breast upper outer quadrant biopsy 09/27/2019 for a clinically T1-T2 N0, stage IA- IIA invasive ductal carcinoma, grade 3, triple negative, with an MIB-1 of 90%.  (1) genetics testing 10/30/2019 through the [facility] Common Hereditary Cancers Panel found no deleterious mutations in APC, ATM, AXIN2, BARD1, BMPR1A, BRCA1, BRCA2, BRIP1, CDH1, CDK4, CDKN2A (p14ARF), CDKN2A (p16INK4a), CHEK2, CTNNA1, DICER1, EPCAM (Deletion/duplication testing only), GREM1 (promoter region deletion/duplication testing only), KIT, MEN1, MLH1, MSH2, MSH3, MSH6, MUTYH, NBN, NF1, NHTL1, PALB2, PDGFRA, PMS2, POLD1, POLE, PTEN, RAD50, RAD51C, RAD51D, RNF43, SDHB, SDHC, SDHD, SMAD4, SMARCA4. STK11, TP53, TSC1, TSC2, and VHL.  The following genes were evaluated for sequence changes only: SDHA and HOXB13 c.251G>A variant  only.  LEFT BREAST: (2) neoadjuvant chemotherapy consisting of doxorubicin and cyclophosphamide in dose dense fashion x4 starting 10/22/2019, completed 12/03/2019,followed by weekly carboplatin and paclitaxel x12 started 12/17/2019, last dose 02/25/2020  (a) echo 10/17/2019 shows an ejection fraction in the 60-65% range  (b) final 2 doses of carboplatinum/paclitaxel omitted with grade 1 neuropathy  (3) status post left lumpectomy and sentinel lymph node sampling 04/16/2020 for a ypT0 ypN0, complete pathologic response  (a) a total of 3 left axillary lymph nodes were removed  RIGHT BREAST: (4) multicentric biopsy x2 on the right (contralateral) breast 02/27/2020 shows ductal carcinoma in situ, estrogen and progesterone receptor strongly positive  (5) right lumpectomy 04/16/2020 shows a residual 1.4 cm of ductal  carcinoma in situ, grade 1, with close but negative margins  (6) adjuvant radiation to both breasts 06/01/2020 through 07/20/2020 Site Technique Total Dose (Gy) Dose per Fx (Gy) Completed Fx Beam Energies  Breast, Left: Breast_Lt 3D 50.4/50.4 1.8 28/28 10X  Breast, Left: Breast_Lt_Bst 3D 10/10 2 5/5 6X, 10X  Breast, Right: Breast_Rt 3D 50.4/50.4 1.8 28/28 10X  Breast, Right: Breast_Rt_Bst 3D 12/12 2 6/6 6X, 10X   (7) tamoxifen started 09/02/2020   PLAN:  Ms. Porcayo is here for follow-up on tamoxifen.  She is tolerating this very well without any major side effects. Physical examination today with no concerns for recurrence, bilateral breast status postsurgical changes. She is due for mammogram in September 20, 2021. We have discussed that although MRIs can help detect breast cancer early, breast cancer recurrence can be a systemic and hence MRI breast may not really help detect metastatic breast cancer sooner.  She was however encouraged to contact us with any new symptoms that warrant further attention.  She expressed understanding of all the recommendations.  She will continue  with tamoxifen and return to clinic in 6 months or sooner as needed. She was encouraged to contact us with any new questions or concerns.   She otherwise will continue with exercise, now is part of the weight loss clinic and she is very motivated to lose weight.  Total time spent: 30 minutes  *Total Encounter Time as defined by the Centers for Medicare and Medicaid Services includes, in addition to the face-to-face time of a patient visit (documented in the note above) non-face-to-face time: obtaining and reviewing outside history, ordering and reviewing medications, tests or procedures, care coordination (communications with other health care professionals or caregivers) and documentation in the medical record.

## 2022-03-07 ENCOUNTER — Ambulatory Visit (INDEPENDENT_AMBULATORY_CARE_PROVIDER_SITE_OTHER): Payer: Medicare HMO | Admitting: Family Medicine

## 2022-03-09 ENCOUNTER — Ambulatory Visit: Payer: Medicare HMO | Admitting: Podiatry

## 2022-03-09 ENCOUNTER — Ambulatory Visit (INDEPENDENT_AMBULATORY_CARE_PROVIDER_SITE_OTHER): Payer: Medicare HMO

## 2022-03-09 DIAGNOSIS — M205X2 Other deformities of toe(s) (acquired), left foot: Secondary | ICD-10-CM

## 2022-03-09 DIAGNOSIS — M79671 Pain in right foot: Secondary | ICD-10-CM | POA: Diagnosis not present

## 2022-03-09 DIAGNOSIS — M205X1 Other deformities of toe(s) (acquired), right foot: Secondary | ICD-10-CM | POA: Diagnosis not present

## 2022-03-09 DIAGNOSIS — M79672 Pain in left foot: Secondary | ICD-10-CM

## 2022-03-09 NOTE — Progress Notes (Signed)
Chief Complaint  Patient presents with   Foot Pain    Patient came in today for bilateral foot pain and swelling started month ago, patient was seen by PCP and stated it was Arthritis, X-rays done today     HPI: 71 y.o. female presenting today as a new patient referral from her PCP for evaluation of bilateral great toe joint arthritis.  Patient states that she was diagnosed arthritis of the bilateral great toe joints by her PCP.  She has only intermittent occasional pain depending on shoe gear.  Denies a history of injury.  Presents for further treatment evaluation  Past Medical History:  Diagnosis Date   Anemia    Arthritis    gout big toe   Breast cancer ([place])    left breast IDC, right breast DCIS   History of radiation therapy 07/20/2020   bilateral breasts 06/01/2020-07/20/2020  Dr Gery Pray   Hypertension    Kidney problem    Obesity    Personal history of chemotherapy    2022 bilat lumpectomies w/rad w/chemo   Personal history of radiation therapy    2022 bilat lumpectomies w/rad w/chemo   Prediabetes    Sleep apnea    does not use CPAP   Vitamin D deficiency     Past Surgical History:  Procedure Laterality Date   ABDOMINAL HYSTERECTOMY     BREAST LUMPECTOMY Bilateral    2022 bilat lumpectomies w/rad w/chemo   BREAST LUMPECTOMY WITH RADIOACTIVE SEED AND SENTINEL LYMPH NODE BIOPSY Left 04/16/2020   Procedure: LEFT BREAST LUMPECTOMY WITH RADIOACTIVE SEED AND LEFT AXILLARY SENTINEL LYMPH NODE BIOPSY;  Surgeon: [name], MD;  Location: [facility];  Service: General;  Laterality: Left;   BREAST LUMPECTOMY WITH RADIOACTIVE SEED LOCALIZATION Right 04/16/2020   Procedure: RIGHT BREAST LUMPECTOMY WITH RADIOACTIVE SEED LOCALIZATION;  Surgeon: [name], MD;  Location: [facility];  Service: General;  Laterality: Right;   IR IMAGING GUIDED PORT INSERTION  10/18/2019   PORT-A-CATH REMOVAL Right 09/02/2020   Procedure: REMOVAL  PORT-A-CATH;  Surgeon: [name], MD;  Location: [facility];  Service: General;  Laterality: Right;    Allergies  Allergen Reactions   Shellfish Allergy Itching     Physical Exam: General: The patient is alert and oriented x3 in no acute distress.  Dermatology: Skin is warm, dry and supple bilateral lower extremities. Negative for open lesions or macerations.  Vascular: Palpable pedal pulses bilaterally. Capillary refill within normal limits.  Minimal edema noted bilateral lower extremities Neurological: Light touch and protective threshold grossly intact  Musculoskeletal Exam: Enlargement of the first MTP noted secondary to the periarticular spurring around the joint.  Limited range of motion as well.  There is no appreciable pain with palpation or range of motion  Radiographic Exam B/L feet 03/09/2022:  Normal osseous mineralization.  Degenerative changes noted to the first MTP with para-articular spurring consistent with hallux limitus  Assessment: 1.  Hallux limitus/DJD bilateral great toe joints -Patient evaluated.  X-rays reviewed -Currently the patient only is symptomatic occasionally depending on shoe gear.  Recommend good supportive shoes and sneakers.  Advised against wearing heels. -Advised against going barefoot -Pursue conservative treatment for now.  We did discuss the possibility of joint arthroplasty with implant as a possibility down the road but currently she is minimally symptomatic so we will continue conservative care and observe for now -Return to clinic as needed   Edrick Kins, [place]  Dr. Edrick Kins, DPM    [address], [place] [address]                Office [phone]  Fax [phone]

## 2022-03-10 ENCOUNTER — Other Ambulatory Visit: Payer: Self-pay | Admitting: Podiatry

## 2022-03-10 ENCOUNTER — Ambulatory Visit (INDEPENDENT_AMBULATORY_CARE_PROVIDER_SITE_OTHER): Payer: Medicare HMO | Admitting: Psychology

## 2022-03-10 DIAGNOSIS — H04123 Dry eye syndrome of bilateral lacrimal glands: Secondary | ICD-10-CM | POA: Diagnosis not present

## 2022-03-10 DIAGNOSIS — H40023 Open angle with borderline findings, high risk, bilateral: Secondary | ICD-10-CM | POA: Diagnosis not present

## 2022-03-10 DIAGNOSIS — F4322 Adjustment disorder with anxiety: Secondary | ICD-10-CM

## 2022-03-10 DIAGNOSIS — M205X1 Other deformities of toe(s) (acquired), right foot: Secondary | ICD-10-CM

## 2022-03-10 DIAGNOSIS — M205X2 Other deformities of toe(s) (acquired), left foot: Secondary | ICD-10-CM

## 2022-03-10 DIAGNOSIS — M79671 Pain in right foot: Secondary | ICD-10-CM

## 2022-03-10 DIAGNOSIS — H43813 Vitreous degeneration, bilateral: Secondary | ICD-10-CM | POA: Diagnosis not present

## 2022-03-10 DIAGNOSIS — Z961 Presence of intraocular lens: Secondary | ICD-10-CM | POA: Diagnosis not present

## 2022-03-10 NOTE — Progress Notes (Signed)
[facility] Counselor/Therapist Progress Note  Patient ID: REEVA LUTTS, MRN: [id number],    Date: 03/10/2022  Time Spent: 11:00am-11:55am   55 minutes   Treatment Type: Individual Therapy  Reported Symptoms: worrying.  Mental Status Exam: Appearance:  Casual     Behavior: Appropriate  Motor: Normal  Speech/Language:  Normal Rate  Affect: Appropriate  Mood: normal  Thought process: normal  Thought content:   WNL  Sensory/Perceptual disturbances:   WNL  Orientation: oriented to person, place, time/date, and situation  Attention: Good  Concentration: Good  Memory: WNL  Fund of knowledge:  Good  Insight:   Good  Judgment:  Good  Impulse Control: Good   Risk Assessment: Danger to Self:  No Self-injurious Behavior: No Danger to Others: No Duty to Warn:no Physical Aggression / Violence:No  Access to Firearms a concern: No  Gang Involvement:No   Subjective: Pt present for face-to-face individual therapy via video Webex.  Pt consents to telehealth video session due to COVID 19 pandemic. Location of pt: home Location of therapist: home office.  Pt talked about working on acceptance.  She defines acceptance as "being ok with things that are other people's choices".   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   Addressed the issues and helped pt process her feelings. Vilma Prader is planning to come home at [place].   Pt is not comfortable with [name] dressing as a female and going to pt's church yet.  Pt is worried about what other people will think.  Addressed pt's worries and concerns. Pt has not talked to her husband about Vilma Prader but plans to do that soon.   Worked on self care strategies.   Provided supportive therapy.     Interventions: Cognitive Behavioral Therapy and Insight-Oriented  Diagnosis: F43.22  Plan: Plan of Care: Recommend ongoing therapy.  Pt participated in setting treatment goals.  Pt states her goals are to have a  safe place to talk and to work on acceptance and to improve coping skills.   Plan to meet every two weeks.   Treatment Plan  (target date: 03/01/2023) Client Abilities/Strengths  Pt is bright, engaging and motivated for therapy.  Client Treatment Preferences  Individual therapy.  Client Statement of Needs  Improve coping skills.  Symptoms  Excessive and/or unrealistic worry that is difficult to control occurring more days than not for at least 6 months about a number of events or activities.  Problems Addressed  Anxiety Goals 1. Enhance ability to effectively cope with the full variety of life's worries and anxieties. 2. Learn and implement coping skills that result in a reduction of anxiety and worry, and improved daily functioning. Objective Learn to accept limitations in life and commit to tolerating, rather than avoiding, unpleasant emotions while accomplishing meaningful goals. Target Date: 2023-03-01 Frequency: Biweekly Progress: 10 Modality: individual Related Interventions 1. Use techniques from Acceptance and Commitment Therapy to help client accept uncomfortable realities such as lack of complete control, imperfections, and uncertainty and tolerate unpleasant emotions and thoughts in order to accomplish value-consistent goals. Objective Learn and implement problem-solving strategies for realistically addressing worries. Target Date: 2023-03-01 Frequency: Biweekly Progress: 10 Modality: individual Related Interventions 1. Assign the client a homework exercise in which he/she problem-solves a current problem.  review, reinforce success, and provide corrective feedback toward improvement. 2. Teach the client problem-solving strategies involving specifically defining a problem, generating options for addressing it, evaluating the pros and cons of each option, selecting and  implementing an optional action, and reevaluating and refining the action. Objective Learn and implement  calming skills to reduce overall anxiety and manage anxiety symptoms. Target Date: 2023-03-01 Frequency: Biweekly Progress: 10 Modality: individual Related Interventions 1. Assign the client to read about progressive muscle relaxation and other calming strategies in relevant books or treatment manuals (e.g., Progressive Relaxation Training by Gwynneth Aliment and Dani Gobble; Mastery of Your Anxiety and Worry: Workbook by Beckie Busing). 2. Assign the client homework each session in which he/she practices relaxation exercises daily, gradually applying them progressively from non-anxiety-provoking to anxiety-provoking situations; review and reinforce success while providing corrective feedback toward improvement. 3. Teach the client calming/relaxation skills (e.g., applied relaxation, progressive muscle relaxation, cue controlled relaxation; mindful breathing; biofeedback) and how to discriminate better between relaxation and tension; teach the client how to apply these skills to his/her daily life. 3. Reduce overall frequency, intensity, and duration of the anxiety so that daily functioning is not impaired. 4. Resolve the core conflict that is the source of anxiety. 5. Stabilize anxiety level while increasing ability to function on a daily basis. Diagnosis :    F43.22 Conditions For Discharge Achievement of treatment goals and objectives.  Loney Domingo, LCSW

## 2022-03-14 ENCOUNTER — Ambulatory Visit (INDEPENDENT_AMBULATORY_CARE_PROVIDER_SITE_OTHER): Payer: Medicare HMO | Admitting: Family Medicine

## 2022-03-15 ENCOUNTER — Ambulatory Visit: Payer: Medicare HMO | Admitting: Psychology

## 2022-03-29 DIAGNOSIS — G4733 Obstructive sleep apnea (adult) (pediatric): Secondary | ICD-10-CM | POA: Diagnosis not present

## 2022-03-29 DIAGNOSIS — E559 Vitamin D deficiency, unspecified: Secondary | ICD-10-CM | POA: Diagnosis not present

## 2022-03-29 DIAGNOSIS — N182 Chronic kidney disease, stage 2 (mild): Secondary | ICD-10-CM | POA: Diagnosis not present

## 2022-03-29 DIAGNOSIS — R7303 Prediabetes: Secondary | ICD-10-CM | POA: Diagnosis not present

## 2022-03-29 DIAGNOSIS — E782 Mixed hyperlipidemia: Secondary | ICD-10-CM | POA: Diagnosis not present

## 2022-03-29 DIAGNOSIS — E669 Obesity, unspecified: Secondary | ICD-10-CM | POA: Diagnosis not present

## 2022-03-29 DIAGNOSIS — D0510 Intraductal carcinoma in situ of unspecified breast: Secondary | ICD-10-CM | POA: Diagnosis not present

## 2022-03-29 DIAGNOSIS — M1711 Unilateral primary osteoarthritis, right knee: Secondary | ICD-10-CM | POA: Diagnosis not present

## 2022-03-30 NOTE — Progress Notes (Signed)
04/01/22- 40 yoF never smoker for sleep evaluation with concern of OSA Medical problem list includes HTN, Arthropathy, hx R Breast Cancer L, Obesity,  Epworth score-8 Body weight today- Had 2 sleep studies in past. Used CPAP until recalled during Covid. Husband tells her she snores.  She slept better with less daytime tiredness when she had CPAP. Dozes off readily if reading and has passenger in car.  No sleep medications and little caffeine. History of nasal fracture as a child but no ENT surgery.  Denies lung disease. Denies heart disease other than longstanding cardiac murmur.  In past was treated for high blood pressure. Now working with [facility] trying to get her weight down.  Apparently she weighed a lot more at some time in the past. She asks about [employer], which we discussed.  Prior to Admission medications   Medication Sig Start Date End Date Taking? Authorizing Provider  allopurinol (ZYLOPRIM) 100 MG tablet Take 100 mg by mouth daily.   Yes [provider]  Cholecalciferol (VITAMIN D-3) 125 MCG (5000 UT) TABS 1 cap(s)   Yes [provider]  cyanocobalamin 500 MCG TABS 300-546mcg qd 08/10/21  Yes Opalski, Neoma Laming, DO  tamoxifen (NOLVADEX) 20 MG tablet Take 1 tablet (20 mg total) by mouth daily. 09/14/21  Yes Benay Pike, MD   Past Medical History:  Diagnosis Date   Anemia    Arthritis    gout big toe   Breast cancer ([place])    left breast IDC, right breast DCIS   History of radiation therapy 07/20/2020   bilateral breasts 06/01/2020-07/20/2020  Dr Gery Pray   Hypertension    Kidney problem    Obesity    Personal history of chemotherapy    2022 bilat lumpectomies w/rad w/chemo   Personal history of radiation therapy    2022 bilat lumpectomies w/rad w/chemo   Prediabetes    Sleep apnea    does not use CPAP   Vitamin D deficiency    Past Surgical History:  Procedure Laterality Date   ABDOMINAL HYSTERECTOMY     BREAST LUMPECTOMY  Bilateral    2022 bilat lumpectomies w/rad w/chemo   BREAST LUMPECTOMY WITH RADIOACTIVE SEED AND SENTINEL LYMPH NODE BIOPSY Left 04/16/2020   Procedure: LEFT BREAST LUMPECTOMY WITH RADIOACTIVE SEED AND LEFT AXILLARY SENTINEL LYMPH NODE BIOPSY;  Surgeon: [name], MD;  Location: [facility];  Service: General;  Laterality: Left;   BREAST LUMPECTOMY WITH RADIOACTIVE SEED LOCALIZATION Right 04/16/2020   Procedure: RIGHT BREAST LUMPECTOMY WITH RADIOACTIVE SEED LOCALIZATION;  Surgeon: [name], MD;  Location: [facility];  Service: General;  Laterality: Right;   IR IMAGING GUIDED PORT INSERTION  10/18/2019   PORT-A-CATH REMOVAL Right 09/02/2020   Procedure: REMOVAL PORT-A-CATH;  Surgeon: [name], MD;  Location: [facility];  Service: General;  Laterality: Right;   Family History  Problem Relation Age of Onset   High Cholesterol Mother    Breast cancer Cousin 60       maternal cousin; bilateral    Social History   Socioeconomic History   Marital status: Married    Spouse name: Not on file   Number of children: Not on file   Years of education: Not on file   Highest education level: Not on file  Occupational History   Not on file  Tobacco Use   Smoking status: Never   Smokeless tobacco: Never  Substance and Sexual Activity   Alcohol use: Never  Drug use: Never   Sexual activity: Yes    Birth control/protection: Surgical  Other Topics Concern   Not on file  Social History Narrative   Not on file   Social Determinants of Health   Financial Resource Strain: Not on file  Food Insecurity: No Food Insecurity (02/18/2022)   Hunger Vital Sign    Worried About Running Out of Food in the Last Year: Never true    Ran Out of Food in the Last Year: Never true  Transportation Needs: No Transportation Needs (02/18/2022)   PRAPARE - Hydrologist (Medical): No    Lack of Transportation  (Non-Medical): No  Physical Activity: Not on file  Stress: Not on file  Social Connections: Not on file  Intimate Partner Violence: Not on file   ROS-see HPI   + = positive Constitutional:    weight loss, night sweats, fevers, chills, fatigue, lassitude. HEENT:    headaches, difficulty swallowing, tooth/dental problems, sore throat,       sneezing, itching, ear ache, nasal congestion, post nasal drip, snoring CV:    chest pain, orthopnea, PND, swelling in lower extremities, anasarca,                                   dizziness, palpitations Resp:   shortness of breath with exertion or at rest.                productive cough,   non-productive cough, coughing up of blood.              change in color of mucus.  wheezing.   Skin:    rash or lesions. GI:  No-   heartburn, indigestion, abdominal pain, nausea, vomiting, diarrhea,                 change in bowel habits, loss of appetite GU: dysuria, change in color of urine, no urgency or frequency.   flank pain. MS:   joint pain, stiffness, decreased range of motion, back pain. Neuro-     nothing unusual Psych:  change in mood or affect.  depression or anxiety.   memory loss.  OBJ- Physical Exam General- Alert, Oriented, Affect-appropriate, Distress- none acute, +obese Skin- rash-none, lesions- none, excoriation- none Lymphadenopathy- none Head- atraumatic            Eyes- Gross vision intact, PERRLA, conjunctivae and secretions clear            Ears- Hearing, canals-normal            Nose- Clear, no-Septal dev, mucus, polyps, erosion, perforation             Throat- Mallampati II , mucosa clear , drainage- none, tonsils- atrophic Neck- flexible , trachea midline, no stridor , thyroid nl, carotid no bruit Chest - symmetrical excursion , unlabored           Heart/CV- RRR ,  murmur+faint S , no gallop  , no rub, nl s1 s2                           - JVD- none , edema- none, stasis changes- none, varices- none           Lung- clear to P&A,  wheeze- none, cough- none , dullness-none, rub- none           Chest wall-  Abd-  Br/ Gen/ Rectal- Not done, not indicated Extrem- cyanosis- none, clubbing, none, atrophy- none, strength- nl Neuro- grossly intact to observation

## 2022-04-01 ENCOUNTER — Encounter: Payer: Self-pay | Admitting: Internal Medicine

## 2022-04-01 ENCOUNTER — Ambulatory Visit: Payer: Medicare HMO | Admitting: Internal Medicine

## 2022-04-01 VITALS — BP 122/70 | HR 76 | Ht 63.5 in | Wt 224.2 lb

## 2022-04-01 DIAGNOSIS — G4733 Obstructive sleep apnea (adult) (pediatric): Secondary | ICD-10-CM | POA: Diagnosis not present

## 2022-04-01 NOTE — Assessment & Plan Note (Signed)
Currently working with [facility] on long-term weight loss strategy.

## 2022-04-01 NOTE — Assessment & Plan Note (Signed)
She continues working with [facility].  Recognizes importance of normalizing body weight.

## 2022-04-01 NOTE — Assessment & Plan Note (Signed)
History of moderate obstructive sleep apnea by her report.  Slept better with less daytime sleepiness and less snoring when she wore CPAP.  We discussed treatment options.  She has some interest in [place]. Plan-update sleep study

## 2022-04-01 NOTE — Patient Instructions (Signed)
Order- Split night sleep study   dx OSA  Please call yus about 2 weeks after your test for results and recommendations

## 2022-04-05 ENCOUNTER — Ambulatory Visit (INDEPENDENT_AMBULATORY_CARE_PROVIDER_SITE_OTHER): Payer: Medicare HMO | Admitting: Psychology

## 2022-04-05 DIAGNOSIS — F4322 Adjustment disorder with anxiety: Secondary | ICD-10-CM

## 2022-04-05 NOTE — Progress Notes (Signed)
[facility] Counselor/Therapist Progress Note  Patient ID: Cheyenne Mcdonald, MRN: [id number],    Date: 04/05/2022  Time Spent: 11:00am-11:55am   55 minutes   Treatment Type: Individual Therapy  Reported Symptoms: worrying.  Mental Status Exam: Appearance:  Casual     Behavior: Appropriate  Motor: Normal  Speech/Language:  Normal Rate  Affect: Appropriate  Mood: normal  Thought process: normal  Thought content:   WNL  Sensory/Perceptual disturbances:   WNL  Orientation: oriented to person, place, time/date, and situation  Attention: Good  Concentration: Good  Memory: WNL  Fund of knowledge:  Good  Insight:   Good  Judgment:  Good  Impulse Control: Good   Risk Assessment: Danger to Self:  No Self-injurious Behavior: No Danger to Others: No Duty to Warn:no Physical Aggression / Violence:No  Access to Firearms a concern: No  Gang Involvement:No   Subjective: Pt present for face-to-face individual therapy via video Webex.  Pt consents to telehealth video session due to COVID 19 pandemic. Location of pt: home Location of therapist: home office.  Pt talked about being very busy.  She likes being busy.  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  Pt is also working on the [employer] for [employer].  Pt wrote her life story for her obituary.   She is thinking about writing her memoires.   Pt talked about Vilma Prader coming to visit for Easter.  She will help with the Easter pageant.  Pt is looking forward to spending time with her. Pt is still not comfortable with [name] dressing as a female and going to pt's church yet.  Pt is worried about what other people will think.  Addressed pt's worries and concerns. Pt talked about working on acceptance.  She defines acceptance as "being ok with things that are other people's choices".   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   Addressed the issues and helped pt process her feelings. Pt  has not yet talked to her husband about Vilma Prader but plans to do that soon.   Worked on self care strategies.   Provided supportive therapy.     Interventions: Cognitive Behavioral Therapy and Insight-Oriented  Diagnosis: F43.22  Plan: Plan of Care: Recommend ongoing therapy.  Pt participated in setting treatment goals.  Pt states her goals are to have a safe place to talk and to work on acceptance and to improve coping skills.   Plan to meet every two weeks.   Treatment Plan  (target date: 03/01/2023) Client Abilities/Strengths  Pt is bright, engaging and motivated for therapy.  Client Treatment Preferences  Individual therapy.  Client Statement of Needs  Improve coping skills.  Symptoms  Excessive and/or unrealistic worry that is difficult to control occurring more days than not for at least 6 months about a number of events or activities.  Problems Addressed  Anxiety Goals 1. Enhance ability to effectively cope with the full variety of life's worries and anxieties. 2. Learn and implement coping skills that result in a reduction of anxiety and worry, and improved daily functioning. Objective Learn to accept limitations in life and commit to tolerating, rather than avoiding, unpleasant emotions while accomplishing meaningful goals. Target Date: 2023-03-01 Frequency: Biweekly Progress: 10 Modality: individual Related Interventions 1. Use techniques from Acceptance and Commitment Therapy to help client accept uncomfortable realities such as lack of complete control, imperfections, and uncertainty and tolerate unpleasant emotions and thoughts in order to accomplish value-consistent  goals. Objective Learn and implement problem-solving strategies for realistically addressing worries. Target Date: 2023-03-01 Frequency: Biweekly Progress: 10 Modality: individual Related Interventions 1. Assign the client a homework exercise in which he/she problem-solves a current problem.  review,  reinforce success, and provide corrective feedback toward improvement. 2. Teach the client problem-solving strategies involving specifically defining a problem, generating options for addressing it, evaluating the pros and cons of each option, selecting and implementing an optional action, and reevaluating and refining the action. Objective Learn and implement calming skills to reduce overall anxiety and manage anxiety symptoms. Target Date: 2023-03-01 Frequency: Biweekly Progress: 10 Modality: individual Related Interventions 1. Assign the client to read about progressive muscle relaxation and other calming strategies in relevant books or treatment manuals (e.g., Progressive Relaxation Training by Gwynneth Aliment and Dani Gobble; Mastery of Your Anxiety and Worry: Workbook by Beckie Busing). 2. Assign the client homework each session in which he/she practices relaxation exercises daily, gradually applying them progressively from non-anxiety-provoking to anxiety-provoking situations; review and reinforce success while providing corrective feedback toward improvement. 3. Teach the client calming/relaxation skills (e.g., applied relaxation, progressive muscle relaxation, cue controlled relaxation; mindful breathing; biofeedback) and how to discriminate better between relaxation and tension; teach the client how to apply these skills to his/her daily life. 3. Reduce overall frequency, intensity, and duration of the anxiety so that daily functioning is not impaired. 4. Resolve the core conflict that is the source of anxiety. 5. Stabilize anxiety level while increasing ability to function on a daily basis. Diagnosis :    F43.22 Conditions For Discharge Achievement of treatment goals and objectives.  [name], LCSW

## 2022-04-12 ENCOUNTER — Encounter (INDEPENDENT_AMBULATORY_CARE_PROVIDER_SITE_OTHER): Payer: Self-pay | Admitting: Family Medicine

## 2022-04-12 ENCOUNTER — Ambulatory Visit (INDEPENDENT_AMBULATORY_CARE_PROVIDER_SITE_OTHER): Payer: Medicare HMO | Admitting: Family Medicine

## 2022-04-12 VITALS — BP 137/79 | HR 62 | Temp 97.8°F | Ht 63.0 in | Wt 213.8 lb

## 2022-04-12 DIAGNOSIS — F4389 Other reactions to severe stress: Secondary | ICD-10-CM | POA: Diagnosis not present

## 2022-04-12 DIAGNOSIS — Z6837 Body mass index (BMI) 37.0-37.9, adult: Secondary | ICD-10-CM | POA: Diagnosis not present

## 2022-04-12 NOTE — Progress Notes (Signed)
[name], D.O.  ABFM, ABOM Specializing in Clinical Bariatric Medicine  Office located at: [address] [place], [place]  [address]     Assessment and Plan:    Adjustment reaction to stress, with emotional eating Assessment: Condition is improving, but not optimized.  Since the last office visit, she has been going to a counselor every 3 weeks. She also goes to church regularly. She endorses that these activities have been improving her stress. However, recently she states that she has been worried about her husband possibly having depression.   Plan: Patient will continue with seeing counselor and going to church.  Continue her prudent nutritional plan and continue to advance exercise and cardiovascular fitness as tolerated in the future.    TREATMENT PLAN FOR OBESITY: BMI 37.0-37.9, adult-current bmi 37.9 Morbid obesity (HCC)-start bmi 38.97/date 07/27/21 Assessment: Condition is Improving, but not optimized.. Biometric data collected today, was reviewed with patient.  Fat mass has decreased by 0.6lb. Muscle mass has increased by .2lb. Total body water has decreased by 3.2lb.   Plan: Continue with the Category 1 meal plan and  keeping a food journal and adhering to recommended goals of 1000-1100 calories and 80+ protein. I emphasized that not skipping meals helps to increase metabolism.  I also gave her different ideas for seasoning foods and preparing lean proteins.    Behavioral Intervention Additional resources provided today: recipe handout, category 1 meal plan information Evidence-based interventions for health behavior change were utilized today including the discussion of self monitoring techniques, problem-solving barriers and SMART goal setting techniques.   Regarding patient's less desirable eating habits and patterns, we employed the technique of small changes.  Pt will specifically work on: meal prepping and planning on Sunday and Wednesday   for next  visit.    Recommended Physical Activity Goals She has agreed to Continue current level of physical activity   FOLLOW UP: No follow-ups on file. She was informed of the importance of frequent follow up visits to maximize her success with intensive lifestyle modifications for her multiple health conditions.       Subjective:   Chief complaint: Obesity Cheyenne Mcdonald is here to discuss her progress with her obesity treatment plan. She is on the the Category 1 Plan and keeping a food journal and adhering to recommended goals of 1000-1100 calories and 80+ protein and states she is following her eating plan approximately 50% of the time. She states she is walking 30 minutes 1 days per week.  Interval History:  [name] is here for a follow up office visit. Since the last office visit she been going to a counselor every 3 weeks. She endorses that she has been following breakfast and lunch mostly well, however eating enough protein at dinner has been difficult for her.   We reviewed her meal plan and all questions were answered. Patient's food recall appears to be accurate and consistent with what is on plan when she is following it. When eating on plan, her hunger and cravings are well controlled.      Review of Systems:  Pertinent positives were addressed with patient today.     Weight Summary and Biometrics   Weight Lost Since Last Visit: 1lb  No data recorded   Vitals Temp: 97.8 F (36.6 C) BP: 137/79 Pulse Rate: 62 SpO2: 100 %   Anthropometric Measurements Height:  (1.6 m) Weight: 213 lb 12.8 oz (97 kg) BMI (Calculated): 37.88 Weight at Last Visit: 214lb  Weight Lost Since Last Visit: 1lb Starting Weight: 220lb Total Weight Loss (lbs): 7 lb (3.175 kg) Peak Weight: 240lb   Body Composition  Body Fat %: 41.4 % Fat Mass (lbs): 88.6 lbs Muscle Mass (lbs): 118.8 lbs Total Body Water (lbs): 81.6 lbs Visceral Fat Rating : 14   Other Clinical Data Fasting:  no Labs: no Today's Visit #: 12 Starting Date: 07/27/21    Objective:   PHYSICAL EXAM:  Blood pressure 137/79, pulse 62, temperature 97.8 F (36.6 C), height 5\' 3"  (1.6 m), weight 213 lb 12.8 oz (97 kg), SpO2 100 %. Body mass index is 37.87 kg/m.  General: Well Developed, well nourished, and in no acute distress.  HEENT: Normocephalic, atraumatic Skin: Warm and dry, cap RF less 2 sec, good turgor Chest:  Normal excursion, shape, no gross abn Respiratory: speaking in full sentences, no conversational dyspnea NeuroM-Sk: Ambulates w/o assistance, moves * 4 Psych: A and O *3, insight good, mood-full  DIAGNOSTIC DATA REVIEWED:  BMET    Component Value Date/Time   NA 139 03/03/2022 1026   NA 141 12/08/2021 0906   K 3.9 03/03/2022 1026   CL 107 03/03/2022 1026   CO2 26 03/03/2022 1026   GLUCOSE 96 03/03/2022 1026   BUN 28 (H) 03/03/2022 1026   BUN 21 12/08/2021 0906   CREATININE 1.23 (H) 03/03/2022 1026   CREATININE 1.17 (H) 08/31/2021 1427   CALCIUM 9.4 03/03/2022 1026   GFRNONAA 47 (L) 03/03/2022 1026   GFRNONAA 50 (L) 08/31/2021 1427   GFRAA 58 (L) 10/09/2019 0806   Lab Results  Component Value Date   HGBA1C 6.0 (H) 12/08/2021   HGBA1C 5.9 (H) 07/27/2021   Lab Results  Component Value Date   INSULIN 17.6 12/08/2021   INSULIN 16.9 07/27/2021   Lab Results  Component Value Date   TSH 2.200 07/27/2021   CBC    Component Value Date/Time   WBC 4.4 03/03/2022 1026   RBC 4.20 03/03/2022 1026   HGB 13.4 03/03/2022 1026   HGB 13.2 08/31/2021 1427   HGB 12.9 07/27/2021 1021   HCT 38.7 03/03/2022 1026   HCT 37.3 07/27/2021 1021   PLT 159 03/03/2022 1026   PLT 150 08/31/2021 1427   PLT 139 (L) 07/27/2021 1021   MCV 92.1 03/03/2022 1026   MCV 93 07/27/2021 1021   MCH 31.9 03/03/2022 1026   MCHC 34.6 03/03/2022 1026   RDW 12.5 03/03/2022 1026   RDW 12.5 07/27/2021 1021   Iron Studies No results found for: "IRON", "TIBC", "FERRITIN", "IRONPCTSAT" Lipid  Panel     Component Value Date/Time   CHOL 135 12/08/2021 0906   TRIG 62 12/08/2021 0906   HDL 49 12/08/2021 0906   LDLCALC 73 12/08/2021 0906   Hepatic Function Panel     Component Value Date/Time   PROT 6.9 03/03/2022 1026   PROT 6.3 12/08/2021 0906   ALBUMIN 4.1 03/03/2022 1026   ALBUMIN 4.1 12/08/2021 0906   AST 16 03/03/2022 1026   AST 15 08/31/2021 1427   ALT 13 03/03/2022 1026   ALT 14 08/31/2021 1427   ALKPHOS 50 03/03/2022 1026   BILITOT 0.4 03/03/2022 1026   BILITOT 0.2 12/08/2021 0906   BILITOT 0.4 08/31/2021 1427      Component Value Date/Time   TSH 2.200 07/27/2021 1021   Nutritional Lab Results  Component Value Date   VD25OH 67.3 12/08/2021   VD25OH 55.4 07/27/2021    Attestations:   Reviewed by clinician on day of visit:  allergies, medications, problem list, medical history, surgical history, family history, social history, and previous encounter notes.   Patient was in the office today and time spent on visit including pre-visit chart review and post-visit care/coordination of care and electronic medical record documentation was 30 minutes. 50% of the time was in face to face counseling of this patient's medical condition(s) and providing education on treatment options to include the first-line treatment of diet and lifestyle modification.   I,Special Puri,acting as a Neurosurgeonscribe for [employer] & McLennanDeborah Laymon Stockert, DO.,have documented all relevant documentation on the behalf of Thomasene LotDeborah Abhishek Levesque, DO,as directed by  Thomasene Loteborah Margie Brink, DO while in the presence of Thomasene Loteborah Airyonna Franklyn, DO.   I, Thomasene Loteborah Paitlyn Mcclatchey, DO, have reviewed all documentation for this visit. The documentation on 04/12/22 for the exam, diagnosis, procedures, and orders are all accurate and complete.

## 2022-04-24 DIAGNOSIS — W4904XA Ring or other jewelry causing external constriction, initial encounter: Secondary | ICD-10-CM | POA: Diagnosis not present

## 2022-04-24 DIAGNOSIS — S61205A Unspecified open wound of left ring finger without damage to nail, initial encounter: Secondary | ICD-10-CM | POA: Diagnosis not present

## 2022-04-27 DIAGNOSIS — R7303 Prediabetes: Secondary | ICD-10-CM | POA: Diagnosis not present

## 2022-04-27 DIAGNOSIS — M1711 Unilateral primary osteoarthritis, right knee: Secondary | ICD-10-CM | POA: Diagnosis not present

## 2022-04-27 DIAGNOSIS — Z0000 Encounter for general adult medical examination without abnormal findings: Secondary | ICD-10-CM | POA: Diagnosis not present

## 2022-04-27 DIAGNOSIS — E669 Obesity, unspecified: Secondary | ICD-10-CM | POA: Diagnosis not present

## 2022-04-27 DIAGNOSIS — D0510 Intraductal carcinoma in situ of unspecified breast: Secondary | ICD-10-CM | POA: Diagnosis not present

## 2022-04-27 DIAGNOSIS — N182 Chronic kidney disease, stage 2 (mild): Secondary | ICD-10-CM | POA: Diagnosis not present

## 2022-04-27 DIAGNOSIS — E782 Mixed hyperlipidemia: Secondary | ICD-10-CM | POA: Diagnosis not present

## 2022-04-27 DIAGNOSIS — G4733 Obstructive sleep apnea (adult) (pediatric): Secondary | ICD-10-CM | POA: Diagnosis not present

## 2022-04-27 DIAGNOSIS — E559 Vitamin D deficiency, unspecified: Secondary | ICD-10-CM | POA: Diagnosis not present

## 2022-04-28 ENCOUNTER — Ambulatory Visit (INDEPENDENT_AMBULATORY_CARE_PROVIDER_SITE_OTHER): Payer: Medicare HMO | Admitting: Family Medicine

## 2022-04-28 ENCOUNTER — Encounter (INDEPENDENT_AMBULATORY_CARE_PROVIDER_SITE_OTHER): Payer: Self-pay | Admitting: Family Medicine

## 2022-04-28 VITALS — BP 133/83 | HR 60 | Temp 98.0°F | Ht 63.0 in | Wt 212.6 lb

## 2022-04-28 DIAGNOSIS — E559 Vitamin D deficiency, unspecified: Secondary | ICD-10-CM

## 2022-04-28 DIAGNOSIS — F4389 Other reactions to severe stress: Secondary | ICD-10-CM | POA: Diagnosis not present

## 2022-04-28 DIAGNOSIS — R7303 Prediabetes: Secondary | ICD-10-CM

## 2022-04-28 DIAGNOSIS — R7989 Other specified abnormal findings of blood chemistry: Secondary | ICD-10-CM

## 2022-04-28 DIAGNOSIS — N183 Chronic kidney disease, stage 3 unspecified: Secondary | ICD-10-CM | POA: Diagnosis not present

## 2022-04-28 DIAGNOSIS — Z6837 Body mass index (BMI) 37.0-37.9, adult: Secondary | ICD-10-CM | POA: Diagnosis not present

## 2022-04-28 DIAGNOSIS — N1831 Chronic kidney disease, stage 3a: Secondary | ICD-10-CM

## 2022-04-28 NOTE — Progress Notes (Signed)
[name], D.O.  ABFM, ABOM Specializing in Clinical Bariatric Medicine  Office located at: [address] [place], [place]  [address]     Assessment and Plan:   Orders Placed This Encounter  Procedures   Basic metabolic panel   VITAMIN D 25 Hydroxy (Vit-D Deficiency, Fractures)   Hemoglobin A1c    Check A1C today and vitamin D and BMP     Elevated Serum creatinine - Stage III CKD Assessment: Condition is Not at goal.. Labs were reviewed.  Lab Results  Component Value Date   CREATININE 1.23 (H) 03/03/2022   CREATININE 1.09 (H) 12/08/2021   CREATININE 1.17 (H) 08/31/2021   Plan: Increase exercise and water intake. Counseling done on elevated serum crt and ways to prevent further damage.  Avoid nephrotoxic substances.  I rec she f/up with PCP as scheduled or sooner if Crt rises even more on labs today   Pre-diabetes  Assessment: Condition is Not at goal.. Labs were reviewed.  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  - Stressed importance of dietary and lifestyle modifications to result in weight loss as first line txmnt - In addition, we discussed the risks and benefits of various medication options which can help [place] in the management of this disease process as well as with weight loss.  Will consider starting one of these meds in future as we will focus on prudent nutritional plan at this time.  - Continue to decrease simple carbs/ sugars; increase fiber and proteins -> follow her meal plan.   - Explained role of simple carbs and insulin levels on hunger and cravings - Handouts provided at pt's request after education provided.  All concerns/questions addressed.   - Anticipatory guidance given.   - Kenyotta will continue to work on weight  loss, exercise, via their meal plan we devised to help decrease the risk of progressing to diabetes.  - We will recheck A1c and fasting insulin level in approximately 3 months from last check, or as deemed appropriate.   Vitamin D Deficieny  Assessment: Condition is Improving, but not optimized.. Labs were reviewed.  Lab Results  Component Value Date   VD25OH 67.3 12/08/2021   VD25OH 55.4 07/27/2021    Plan:Continue taking Cholecalciferol 5000IU qd. Will check vit D today - I discussed the importance of vitamin D to the patient's health and well-being as well as to their ability to lose weight.  - I reviewed possible symptoms of low Vitamin D:  low energy, depressed mood, muscle aches, joint aches, osteoporosis etc. with patient - It has been show that administration of vitamin D supplementation leads to improved satiety and a decrease in inflammatory markers.  Hence, low Vitamin D levels may be linked to an increased risk of cardiovascular events and even increased risk of cancers- such as colon and breast. - Informed patient again, that this may be a lifelong thing, and she was encouraged to continue to take the medicine until told otherwise.    - weight loss will likely improve availability of vitamin D, thus encouraged Analese to continue with meal plan and their weight loss efforts to further improve this condition.  Thus, we will need to monitor levels regularly (every 3-4 mo  on average) to keep levels within normal limits and prevent over supplementation. - pt's questions and concerns regarding this condition addressed.    Adjustment reaction to stress, with emotional eating Assessment: Condition is Improving, but not optimized..   Plan:Patient will continue with seeing counselor and going to church and bible study. Continue her prudent nutritional plan and continue to advance exercise and cardiovascular fitness as tolerated in the future. She will also continue seeing her therapist every  3 weeks.   TREATMENT PLAN FOR OBESITY: BMI 37.0-37.9, adult-current bmi 37.9 Morbid obesity (HCC)-start bmi 38.97/date 07/27/21 Assessment: Condition is improving. Biometric data collected today, was reviewed with patient.  Fat mass has decreased by 1.4lb. Muscle mass has increased by 0.2lb. Total body water has decreased by 1.4lb.   Plan: Continue with the Category 1 meal plan and  keeping a food journal and adhering to recommended goals of 1000-1100 calories and 80+ protein. Advised to use weight watchers cooking recipes.  Navada is currently in the action stage of change. As such, her goal is to continue weight management plan. Twanna will work on [employer] habits and try their best to follow the Continue the Category 1 Plan and keeping a food journal and adhering to recommended goals of [phone] calories and 80+ protein  best they can.   Behavioral Intervention Additional resources provided today: slow cooker meal options handout Evidence-based interventions for health behavior change were utilized today including the discussion of self monitoring techniques, problem-solving barriers and SMART goal setting techniques.   Regarding patient's less desirable eating habits and patterns, we employed the technique of small changes.  Pt will specifically work on: continue with meal prep on Sunday and Wednesday and exercise 30 minutes for 5 days a week for next visit.    Recommended Physical Activity Goals Aldea has been advised to work up to 150 minutes of moderate intensity aerobic activity a week and strengthening exercises 2-3 times per week for cardiovascular health, weight loss maintenance and preservation of muscle mass.  She has agreed to Continue current level of physical activity  and Think about ways to increase physical activity  FOLLOW UP: Return in about 3 weeks (around 05/19/2022).  She was informed of the importance of frequent follow up visits to maximize her success with intensive  lifestyle modifications for her multiple health conditions.    Subjective:   Chief complaint: Obesity Aralynn is here to discuss her progress with her obesity treatment plan. She is on the the Category 1 Plan and keeping a food journal and adhering to recommended goals of [phone] calories and 80+ protein and states she is following her eating plan approximately 75% of the time. She states she is exercising walking and water aerobics 45 minutes 2-3 days per week.  Interval History:  AVANTHIKA DEHNERT is here for a follow up office visit. Since last office visit she states that  She reports seeing her therapist every 3 weeks. She states that she as been going to her church counselor and bible study. She states that she's been making more mindful food choices when eating out. She reports that she has been meal prepping like we discussed last ov. She states she will be partaking in a sleep study soon.   We reviewed her meal plan and all questions were answered. Patient's food recall appears to be accurate and consistent with what is on plan when she is following it. When eating on plan, her hunger and cravings are well controlled.  Pharmacotherapy for weight loss: She  currently taking meds for medical weight loss.  Denies side effects.    Review of Systems:  Pertinent positives were addressed with patient today.   Weight Summary and Biometrics   Weight Lost Since Last Visit: 1lb  Weight Gained Since Last Visit: 0   Vitals Temp: 98 F (36.7 C) BP: 133/83 Pulse Rate: 60 SpO2: 97 %   Anthropometric Measurements Height: 5\' 3"  (1.6 m) Weight: 212 lb 9.6 oz (96.4 kg) BMI (Calculated): 37.67 Weight at Last Visit: 213lb Weight Lost Since Last Visit: 1lb Weight Gained Since Last Visit: 0 Starting Weight: 220lb Total Weight Loss (lbs): 8 lb (3.629 kg) Peak Weight: 240lb   Body Composition  Body Fat %: 41 % Fat Mass (lbs): 87.2 lbs Muscle Mass (lbs): 119 lbs Total Body Water  (lbs): 80.2 lbs Visceral Fat Rating : 14   Other Clinical Data Fasting: no Labs: no Today's Visit #: 13 Starting Date: 07/27/21     Objective:   PHYSICAL EXAM: Blood pressure 133/83, pulse 60, temperature 98 F (36.7 C), height 5\' 3"  (1.6 m), weight 212 lb 9.6 oz (96.4 kg), SpO2 97 %. Body mass index is 37.66 kg/m.  General: Well Developed, well nourished, and in no acute distress.  HEENT: Normocephalic, atraumatic Skin: Warm and dry, cap RF less 2 sec, good turgor Chest:  Normal excursion, shape, no gross abn Respiratory: speaking in full sentences, no conversational dyspnea NeuroM-Sk: Ambulates w/o assistance, moves * 4 Psych: A and O *3, insight good, mood-full  DIAGNOSTIC DATA REVIEWED:  BMET    Component Value Date/Time   NA 139 03/03/2022 1026   NA 141 12/08/2021 0906   K 3.9 03/03/2022 1026   CL 107 03/03/2022 1026   CO2 26 03/03/2022 1026   GLUCOSE 96 03/03/2022 1026   BUN 28 (H) 03/03/2022 1026   BUN 21 12/08/2021 0906   CREATININE 1.23 (H) 03/03/2022 1026   CREATININE 1.17 (H) 08/31/2021 1427   CALCIUM 9.4 03/03/2022 1026   GFRNONAA 47 (L) 03/03/2022 1026   GFRNONAA 50 (L) 08/31/2021 1427   GFRAA 58 (L) 10/09/2019 0806   Lab Results  Component Value Date   HGBA1C 6.0 (H) 12/08/2021   HGBA1C 5.9 (H) 07/27/2021   Lab Results  Component Value Date   INSULIN 17.6 12/08/2021   INSULIN 16.9 07/27/2021   Lab Results  Component Value Date   TSH 2.200 07/27/2021   CBC    Component Value Date/Time   WBC 4.4 03/03/2022 1026   RBC 4.20 03/03/2022 1026   HGB 13.4 03/03/2022 1026   HGB 13.2 08/31/2021 1427   HGB 12.9 07/27/2021 1021   HCT 38.7 03/03/2022 1026   HCT 37.3 07/27/2021 1021   PLT 159 03/03/2022 1026   PLT 150 08/31/2021 1427   PLT 139 (L) 07/27/2021 1021   MCV 92.1 03/03/2022 1026   MCV 93 07/27/2021 1021   MCH 31.9 03/03/2022 1026   MCHC 34.6 03/03/2022 1026   RDW 12.5 03/03/2022 1026   RDW 12.5 07/27/2021 1021   Iron  Studies No results found for: "IRON", "TIBC", "FERRITIN", "IRONPCTSAT" Lipid Panel     Component Value Date/Time   CHOL 135 12/08/2021 0906   TRIG 62 12/08/2021 0906   HDL 49 12/08/2021 0906   LDLCALC 73 12/08/2021 0906   Hepatic Function Panel     Component Value Date/Time   PROT 6.9 03/03/2022 1026   PROT 6.3 12/08/2021 0906   ALBUMIN 4.1 03/03/2022 1026  ALBUMIN 4.1 12/08/2021 0906   AST 16 03/03/2022 1026   AST 15 08/31/2021 1427   ALT 13 03/03/2022 1026   ALT 14 08/31/2021 1427   ALKPHOS 50 03/03/2022 1026   BILITOT 0.4 03/03/2022 1026   BILITOT 0.2 12/08/2021 0906   BILITOT 0.4 08/31/2021 1427      Component Value Date/Time   TSH 2.200 07/27/2021 1021   Nutritional Lab Results  Component Value Date   VD25OH 67.3 12/08/2021   VD25OH 55.4 07/27/2021    Attestations:   Reviewed by clinician on day of visit: allergies, medications, problem list, medical history, surgical history, family history, social history, and previous encounter notes.  I,Safa M Kadhim,acting as a scribe for [employer], DO.,have documented all relevant documentation on the behalf of Thomasene Lot, DO,as directed by  Thomasene Lot, DO while in the presence of Thomasene Lot, DO.   I, Thomasene Lot, DO, have reviewed all documentation for this visit. The documentation on 04/28/22 for the exam, diagnosis, procedures, and orders are all accurate and complete.

## 2022-04-29 LAB — BASIC METABOLIC PANEL
BUN/Creatinine Ratio: 25 (ref 12–28)
BUN: 29 mg/dL — ABNORMAL HIGH (ref 8–27)
CO2: 21 mmol/L (ref 20–29)
Calcium: 9.9 mg/dL (ref 8.7–10.3)
Chloride: 106 mmol/L (ref 96–106)
Creatinine, Ser: 1.16 mg/dL — ABNORMAL HIGH (ref 0.57–1.00)
Glucose: 94 mg/dL (ref 70–99)
Potassium: 4.1 mmol/L (ref 3.5–5.2)
Sodium: 141 mmol/L (ref 134–144)
eGFR: 50 mL/min/{1.73_m2} — ABNORMAL LOW (ref >59)

## 2022-04-29 LAB — HEMOGLOBIN A1C
Est. average glucose Bld gHb Est-mCnc: 128 mg/dL
Hgb A1c MFr Bld: 6.1 % — ABNORMAL HIGH (ref 4.8–5.6)

## 2022-04-29 LAB — VITAMIN D 25 HYDROXY (VIT D DEFICIENCY, FRACTURES): Vit D, 25-Hydroxy: 62.7 ng/mL (ref 30.0–100.0)

## 2022-05-03 ENCOUNTER — Ambulatory Visit (INDEPENDENT_AMBULATORY_CARE_PROVIDER_SITE_OTHER): Payer: Medicare HMO | Admitting: Psychology

## 2022-05-03 DIAGNOSIS — F4322 Adjustment disorder with anxiety: Secondary | ICD-10-CM | POA: Diagnosis not present

## 2022-05-03 NOTE — Progress Notes (Signed)
[facility] Counselor/Therapist Progress Note  Patient ID: Cheyenne Mcdonald, MRN: [id number],    Date: 05/03/2022  Time Spent: 11:00am-11:55am   55 minutes   Treatment Type: Individual Therapy  Reported Symptoms: worrying.  Mental Status Exam: Appearance:  Casual     Behavior: Appropriate  Motor: Normal  Speech/Language:  Normal Rate  Affect: Appropriate  Mood: normal  Thought process: normal  Thought content:   WNL  Sensory/Perceptual disturbances:   WNL  Orientation: oriented to person, place, time/date, and situation  Attention: Good  Concentration: Good  Memory: WNL  Fund of knowledge:  Good  Insight:   Good  Judgment:  Good  Impulse Control: Good   Risk Assessment: Danger to Self:  No Self-injurious Behavior: No Danger to Others: No Duty to Warn:no Physical Aggression / Violence:No  Access to Firearms a concern: No  Gang Involvement:No   Subjective: Pt present for face-to-face individual therapy via video.  Pt consents to telehealth video session due to COVID 19 pandemic. Location of pt: home Location of therapist: home office.  Pt talked about health issues.  She fell a couple of weeks ago and hurt her hand.  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  She is frustrated that she can not go to the pool or take her walks until things heal.   Pt's grand daughter was home for Easter and it was a great visit.   They had a talk and pt's granddaughter asked if she can bring her girlfriend home for Christmas.  Pt did not answer her yet bc she has to think about it and process things.  Pt is concerned her grand daughter may want to make her LGBTQ issues more public.   Pt is not sure she is ready for that.  Pt talked about working on acceptance.  She defines acceptance as "being ok with things that are other people's choices".   She is continuing to work on Physicist, medical of her granddaughter [name] as a trans female.   Addressed the issues and helped pt process her feelings. Pt  talked about being a cancer survivor.   Since her caner she has a greater purpose in life.   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  She had a challenging incident with a student that pt shared about.  Helped pt process the incident and her feelings.  Pt talked about her husband.  He still does not go anywhere.  Pt thinks he is in the beginning stages of dementia and makes some bad decisions at times.  He recently bought an $85,000.00 boat.  Pt did not agree with his decision to buy the boat.  He has not used the boat in the 6 months he has had it.   Pt wants to travel but her husband does not want to go anywhere.   Addressed how frustrating this is for pt.  Pt plans to travel some with her friends and family.  Pt would like to go to [place] since she speaks [place].   Worked on self care strategies.   Provided supportive therapy.     Interventions: Cognitive Behavioral Therapy and Insight-Oriented  Diagnosis: F43.22  Plan: Plan of Care: Recommend ongoing therapy.  Pt participated in setting treatment goals.  Pt states her goals are to have a safe place to talk and to work on acceptance and to improve coping skills.   Plan to meet every two weeks.   Treatment Plan  (target date: 03/01/2023) Client  Abilities/Strengths  Pt is bright, engaging and motivated for therapy.  Client Treatment Preferences  Individual therapy.  Client Statement of Needs  Improve coping skills.  Symptoms  Excessive and/or unrealistic worry that is difficult to control occurring more days than not for at least 6 months about a number of events or activities.  Problems Addressed  Anxiety Goals 1. Enhance ability to effectively cope with the full variety of life's worries and anxieties. 2. Learn and implement coping skills that result in a reduction of anxiety and worry, and improved daily functioning. Objective Learn to accept limitations in life and commit to tolerating, rather than avoiding, unpleasant  emotions while accomplishing meaningful goals. Target Date: 2023-03-01 Frequency: Biweekly Progress: 10 Modality: individual Related Interventions 1. Use techniques from Acceptance and Commitment Therapy to help client accept uncomfortable realities such as lack of complete control, imperfections, and uncertainty and tolerate unpleasant emotions and thoughts in order to accomplish value-consistent goals. Objective Learn and implement problem-solving strategies for realistically addressing worries. Target Date: 2023-03-01 Frequency: Biweekly Progress: 10 Modality: individual Related Interventions 1. Assign the client a homework exercise in which he/she problem-solves a current problem.  review, reinforce success, and provide corrective feedback toward improvement. 2. Teach the client problem-solving strategies involving specifically defining a problem, generating options for addressing it, evaluating the pros and cons of each option, selecting and implementing an optional action, and reevaluating and refining the action. Objective Learn and implement calming skills to reduce overall anxiety and manage anxiety symptoms. Target Date: 2023-03-01 Frequency: Biweekly Progress: 10 Modality: individual Related Interventions 1. Assign the client to read about progressive muscle relaxation and other calming strategies in relevant books or treatment manuals (e.g., Progressive Relaxation Training by Robb Matar and Alen Blew; Mastery of Your Anxiety and Worry: Workbook by Earlie Counts). 2. Assign the client homework each session in which he/she practices relaxation exercises daily, gradually applying them progressively from non-anxiety-provoking to anxiety-provoking situations; review and reinforce success while providing corrective feedback toward improvement. 3. Teach the client calming/relaxation skills (e.g., applied relaxation, progressive muscle relaxation, cue controlled relaxation; mindful  breathing; biofeedback) and how to discriminate better between relaxation and tension; teach the client how to apply these skills to his/her daily life. 3. Reduce overall frequency, intensity, and duration of the anxiety so that daily functioning is not impaired. 4. Resolve the core conflict that is the source of anxiety. 5. Stabilize anxiety level while increasing ability to function on a daily basis. Diagnosis :    F43.22 Conditions For Discharge Achievement of treatment goals and objectives.  Leronda Lewers, LCSW

## 2022-05-04 DIAGNOSIS — M109 Gout, unspecified: Secondary | ICD-10-CM | POA: Diagnosis not present

## 2022-05-11 ENCOUNTER — Ambulatory Visit (INDEPENDENT_AMBULATORY_CARE_PROVIDER_SITE_OTHER): Payer: Medicare HMO | Admitting: Family Medicine

## 2022-05-11 ENCOUNTER — Encounter (INDEPENDENT_AMBULATORY_CARE_PROVIDER_SITE_OTHER): Payer: Self-pay | Admitting: Family Medicine

## 2022-05-11 VITALS — BP 136/67 | HR 76 | Temp 97.9°F | Ht 63.0 in | Wt 215.0 lb

## 2022-05-11 DIAGNOSIS — N1831 Chronic kidney disease, stage 3a: Secondary | ICD-10-CM | POA: Diagnosis not present

## 2022-05-11 DIAGNOSIS — E559 Vitamin D deficiency, unspecified: Secondary | ICD-10-CM | POA: Diagnosis not present

## 2022-05-11 DIAGNOSIS — R7303 Prediabetes: Secondary | ICD-10-CM | POA: Diagnosis not present

## 2022-05-11 DIAGNOSIS — Z6837 Body mass index (BMI) 37.0-37.9, adult: Secondary | ICD-10-CM

## 2022-05-11 DIAGNOSIS — Z6838 Body mass index (BMI) 38.0-38.9, adult: Secondary | ICD-10-CM | POA: Diagnosis not present

## 2022-05-11 NOTE — Progress Notes (Signed)
Cheyenne Mcdonald, D.O.  ABFM, ABOM Specializing in Clinical Bariatric Medicine  Office located at: [address] [place], [place]  [address]     Assessment and Plan:   Prediabetes Assessment: Condition is not optimized, worsening.  Labs were reviewed with pt today and education provided on them.   Lab Results  Component Value Date   HGBA1C 6.1 (H) 04/28/2022   HGBA1C 6.0 (H) 12/08/2021   HGBA1C 5.9 (H) 07/27/2021   INSULIN 17.6 12/08/2021   INSULIN 16.9 07/27/2021  - Her A1c levels have increased from 6.0 on 12/08/2021 to 6.1 on 04/28/2022.  - Patient is not on any prediabetic medication currently. This is diet controlled.  - Her hunger and cravings are controlled when eating on the plan.   Plan: - Continue to decrease simple carbs/ sugars; increase fiber and proteins -> follow her meal plan.   - Cheyenne Mcdonald will continue to work on weight loss, exercise, via their meal plan we devised to help decrease the risk of progressing to diabetes.  - We will recheck A1c and fasting insulin level in approximately 3 months from last check, or as deemed appropriate.     Stage 3a chronic kidney disease (HCC) - elevated serum crt Assessment: Condition is not optimized but stable.  Lab Results  Component Value Date   CREATININE 1.16 (H) 04/28/2022   BUN 29 (H) 04/28/2022   NA 141 04/28/2022   K 4.1 04/28/2022   CL 106 04/28/2022   CO2 21 04/28/2022      Component Value Date/Time   PROT 6.9 03/03/2022 1026   PROT 6.3 12/08/2021 0906   ALBUMIN 4.1 03/03/2022 1026   ALBUMIN 4.1 12/08/2021 0906   AST 16 03/03/2022 1026   AST 15 08/31/2021 1427   ALT 13 03/03/2022 1026   ALT 14 08/31/2021 1427   ALKPHOS 50 03/03/2022 1026   BILITOT 0.4 03/03/2022 1026   BILITOT 0.2 12/08/2021 0906   BILITOT 0.4 08/31/2021 1427  - Labs were reviewed with pt today and education provided on them and how the foods patient eats may influence these findings. All questions were answered about them.   Discussed how her labs indicate that her Kidney's are not filtering optimally with a low GFR and elevated creatinine. She is also likely dehydrated/ not drinking enough water. Patient states she drinks 2 large cups of water a day only, but does not know the exact amount.   Plan: - Continue her prudent nutritional plan that is low in simple carbohydrates, saturated fats and trans fats to goal of 5-10% weight loss to achieve significant health benefits.  Pt encouraged to continually advance exercise and cardiovascular fitness as tolerated throughout weight loss journey.  -  Unless pre-existing renal or cardiopulmonary conditions exist which patient was told to limit their fluid intake by another provider, I recommended roughly one half of their weight in pounds, to be the approximate ounces of non-caloric, non-caffeinated beverages they should drink per day; including more if they are engaging in exercise.- - I recommened the patient to also avoid Advil, Alieve, and Nephrotoxic substance to prevent kidney damage.    Vitamin D deficiency Assessment: Condition is stable/ at goal. Labs were reviewed with pt today and education provided on them and how the foods patient eats may influence these findings. All questions were answered about them. Lab Results  Component Value Date   VD25OH 62.7 04/28/2022   VD25OH 67.3 12/08/2021   VD25OH 55.4 07/27/2021  - Patient has been  compliant with OTC Vitamin D3 5,000 UT daily. Denies any side effects.  Plan: - Continue with med as prescribed.  - weight loss will likely improve availability of vitamin D, thus encouraged Cheyenne Mcdonald to continue with meal plan and their weight loss efforts to further improve this condition.  - I again reiterated the importance of vitamin D (as well as calcium) to their health and wellbeing and how low Vitamin D levels may be linked to an increased risk of cardiovascular events and even increased risk of cancers- such as colon and breast.   - ideal vitamin D levels reviewed with patient  - weight loss will likely improve availability of vitamin D, thus encouraged Cheyenne Mcdonald to continue with meal plan and their weight loss efforts to further improve this condition.  Thus, we will need to monitor levels regularly (every 3-4 mo on average) to keep levels within normal limits and prevent over supplementation. - pt's questions and concerns regarding this condition addressed.   TREATMENT PLAN FOR OBESITY: BMI 37.0-37.9, adult-current bmi 38.09 Morbid obesity (HCC)-start bmi 38.97/date 07/27/21 Assessment: Condition is not optimized. Biometric data collected today, was reviewed with patient.  Fat mass has increased by 5 lb. Muscle mass has decreased by 2 lb. Total body water has increased by 2.4 lb.   Plan:  Cheyenne Mcdonald is currently in the action stage of change. As such, her goal is to continue weight management plan. Cheyenne Mcdonald will work on [employer] habits and continue the Category 1 meal plan and begin journaling her intake ([phone] calories and 80+ grams of protein). - I emphasized the importance of journaling for mindful eating.   Behavioral Intervention Additional resources provided today:  food journaling log Evidence-based interventions for health behavior change were utilized today including the discussion of self monitoring techniques, problem-solving barriers and SMART goal setting techniques.   Regarding patient's less desirable eating habits and patterns, we employed the technique of small changes.  Pt will specifically work on: journaling all of her food intake ([phone] calories and 80+ grams of protein) and bring food log for next visit.    Recommended Physical Activity Goals Cheyenne Mcdonald has been advised to work up to 150 minutes of moderate intensity aerobic activity a week and strengthening exercises 2-3 times per week for cardiovascular health, weight loss maintenance and preservation of muscle mass.  She has agreed to Continue  current level of physical activity   FOLLOW UP: Return in about 3 weeks (around 06/01/2022). She was informed of the importance of frequent follow up visits to maximize her success with intensive lifestyle modifications for her multiple health conditions.   Subjective:   Chief complaint: Obesity Cheyenne Mcdonald is here to discuss her progress with her obesity treatment plan. She is on the the Category 1 Plan and states she is following her eating plan approximately 65-70% of the time. She states she is exercising (walking and gardening)   30 minutes 2 days per week.  Interval History:  Cheyenne Mcdonald is here for a follow up office visit.  Since last office visit she: - Endorses walking up with pain in her foot after exercising. I showed the patient sandals and shoes with good padding.  - She has not been journaling her intake and having more her Spaghetti recently. - She recently went to a cook out.    Pharmacotherapy for weight loss:   Review of Systems:  Pertinent positives were addressed with patient today.  Weight Summary and Biometrics   Weight Lost Since Last Visit:  0  Weight Gained Since Last Visit: 3 lb    Vitals Temp: 97.9 F (36.6 C) BP: 136/67 Pulse Rate: 76 SpO2: 96 %   Anthropometric Measurements Height: 5\' 3"  (1.6 m) Weight: 215 lb (97.5 kg) BMI (Calculated): 38.09 Weight at Last Visit: 212 lb Weight Lost Since Last Visit: 0 Weight Gained Since Last Visit: 3 lb Starting Weight: 220 lb Total Weight Loss (lbs): 5 lb (2.268 kg) Peak Weight: 240 lb   Body Composition  Body Fat %: 42.8 % Fat Mass (lbs): 92.2 lbs Muscle Mass (lbs): 117 lbs Total Body Water (lbs): 82.6 lbs Visceral Fat Rating : 15   Other Clinical Data Fasting: No Labs: No Today's Visit #: 14 Starting Date: 07/27/21   Objective:   PHYSICAL EXAM: Blood pressure 136/67, pulse 76, temperature 97.9 F (36.6 C), height 5\' 3"  (1.6 m), weight 215 lb (97.5 kg), SpO2 96 %. Body mass index is  38.09 kg/m.  General: Well Developed, well nourished, and in no acute distress.  HEENT: Normocephalic, atraumatic Skin: Warm and dry, cap RF less 2 sec, good turgor Chest:  Normal excursion, shape, no gross abn Respiratory: speaking in full sentences, no conversational dyspnea NeuroM-Sk: Ambulates w/o assistance, moves * 4 Psych: A and O *3, insight good, mood-full  DIAGNOSTIC DATA REVIEWED:  BMET    Component Value Date/Time   NA 141 04/28/2022 0913   K 4.1 04/28/2022 0913   CL 106 04/28/2022 0913   CO2 21 04/28/2022 0913   GLUCOSE 94 04/28/2022 0913   GLUCOSE 96 03/03/2022 1026   BUN 29 (H) 04/28/2022 0913   CREATININE 1.16 (H) 04/28/2022 0913   CREATININE 1.17 (H) 08/31/2021 1427   CALCIUM 9.9 04/28/2022 0913   GFRNONAA 47 (L) 03/03/2022 1026   GFRNONAA 50 (L) 08/31/2021 1427   GFRAA 58 (L) 10/09/2019 0806   Lab Results  Component Value Date   HGBA1C 6.1 (H) 04/28/2022   HGBA1C 5.9 (H) 07/27/2021   Lab Results  Component Value Date   INSULIN 17.6 12/08/2021   INSULIN 16.9 07/27/2021   Lab Results  Component Value Date   TSH 2.200 07/27/2021   CBC    Component Value Date/Time   WBC 4.4 03/03/2022 1026   RBC 4.20 03/03/2022 1026   HGB 13.4 03/03/2022 1026   HGB 13.2 08/31/2021 1427   HGB 12.9 07/27/2021 1021   HCT 38.7 03/03/2022 1026   HCT 37.3 07/27/2021 1021   PLT 159 03/03/2022 1026   PLT 150 08/31/2021 1427   PLT 139 (L) 07/27/2021 1021   MCV 92.1 03/03/2022 1026   MCV 93 07/27/2021 1021   MCH 31.9 03/03/2022 1026   MCHC 34.6 03/03/2022 1026   RDW 12.5 03/03/2022 1026   RDW 12.5 07/27/2021 1021   Iron Studies No results found for: "IRON", "TIBC", "FERRITIN", "IRONPCTSAT" Lipid Panel     Component Value Date/Time   CHOL 135 12/08/2021 0906   TRIG 62 12/08/2021 0906   HDL 49 12/08/2021 0906   LDLCALC 73 12/08/2021 0906   Hepatic Function Panel     Component Value Date/Time   PROT 6.9 03/03/2022 1026   PROT 6.3 12/08/2021 0906    ALBUMIN 4.1 03/03/2022 1026   ALBUMIN 4.1 12/08/2021 0906   AST 16 03/03/2022 1026   AST 15 08/31/2021 1427   ALT 13 03/03/2022 1026   ALT 14 08/31/2021 1427   ALKPHOS 50 03/03/2022 1026   BILITOT 0.4 03/03/2022 1026   BILITOT 0.2 12/08/2021 0906   BILITOT 0.4  08/31/2021 1427      Component Value Date/Time   TSH 2.200 07/27/2021 1021   Nutritional Lab Results  Component Value Date   VD25OH 62.7 04/28/2022   VD25OH 67.3 12/08/2021   VD25OH 55.4 07/27/2021    Attestations:   Reviewed by clinician on day of visit: allergies, medications, problem list, medical history, surgical history, family history, social history, and previous encounter notes.    I,Special Puri,acting as a Neurosurgeon for [employer], DO.,have documented all relevant documentation on the behalf of Thomasene Lot, DO,as directed by  Thomasene Lot, DO while in the presence of Thomasene Lot, DO.   I, Thomasene Lot, DO, have reviewed all documentation for this visit. The documentation on 05/11/22 for the exam, diagnosis, procedures, and orders are all accurate and complete.

## 2022-05-18 DIAGNOSIS — N182 Chronic kidney disease, stage 2 (mild): Secondary | ICD-10-CM | POA: Diagnosis not present

## 2022-05-18 DIAGNOSIS — E669 Obesity, unspecified: Secondary | ICD-10-CM | POA: Diagnosis not present

## 2022-05-18 DIAGNOSIS — N390 Urinary tract infection, site not specified: Secondary | ICD-10-CM | POA: Diagnosis not present

## 2022-05-18 DIAGNOSIS — G4733 Obstructive sleep apnea (adult) (pediatric): Secondary | ICD-10-CM | POA: Diagnosis not present

## 2022-05-18 DIAGNOSIS — I10 Essential (primary) hypertension: Secondary | ICD-10-CM | POA: Diagnosis not present

## 2022-05-31 ENCOUNTER — Ambulatory Visit (INDEPENDENT_AMBULATORY_CARE_PROVIDER_SITE_OTHER): Payer: Medicare HMO | Admitting: Psychology

## 2022-05-31 DIAGNOSIS — F4322 Adjustment disorder with anxiety: Secondary | ICD-10-CM

## 2022-05-31 NOTE — Progress Notes (Signed)
[facility] Counselor/Therapist Progress Note  Patient ID: Cheyenne Mcdonald, MRN: [id number],    Date: 05/31/2022  Time Spent: 9:00am-9:45am   45 minutes   Treatment Type: Individual Therapy  Reported Symptoms: worrying.  Mental Status Exam: Appearance:  Casual     Behavior: Appropriate  Motor: Normal  Speech/Language:  Normal Rate  Affect: Appropriate  Mood: normal  Thought process: normal  Thought content:   WNL  Sensory/Perceptual disturbances:   WNL  Orientation: oriented to person, place, time/date, and situation  Attention: Good  Concentration: Good  Memory: WNL  Fund of knowledge:  Good  Insight:   Good  Judgment:  Good  Impulse Control: Good   Risk Assessment: Danger to Self:  No Self-injurious Behavior: No Danger to Others: No Duty to Warn:no Physical Aggression / Violence:No  Access to Firearms a concern: No  Gang Involvement:No   Subjective: Pt present for face-to-face individual therapy via video.  Pt consents to telehealth video session due to COVID 19 pandemic. Location of pt: home Location of therapist: home office.  Pt talked about being very busy.  She has been helping a friend with her play.  She has been working in her yard.  Pt states working in her yard brings her joy.   Pt talked about her health.   She is trying to lose weight.  She is going to [employer] [facility] and really likes the program.  Pt has a goal to lose 40 lbs.   Pt has scheduled her trip to [place] for next year. She is looking forward to it and is enjoying planning for the trip.  Pt talked about working on acceptance.  She defines acceptance as "being ok with things that are other people's choices".   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   Addressed the issues and helped pt process her feelings. Worked on self care strategies.   Provided supportive therapy.     Interventions: Cognitive Behavioral Therapy and  Insight-Oriented  Diagnosis: F43.22  Plan of Care: Recommend ongoing therapy.  Pt participated in setting treatment goals.  Pt states her goals are to have a safe place to talk and to work on acceptance and to improve coping skills.   Plan to meet every two weeks.   Treatment Plan  (target date: 03/01/2023) Client Abilities/Strengths  Pt is bright, engaging and motivated for therapy.  Client Treatment Preferences  Individual therapy.  Client Statement of Needs  Improve coping skills.  Symptoms  Excessive and/or unrealistic worry that is difficult to control occurring more days than not for at least 6 months about a number of events or activities.  Problems Addressed  Anxiety Goals 1. Enhance ability to effectively cope with the full variety of life's worries and anxieties. 2. Learn and implement coping skills that result in a reduction of anxiety and worry, and improved daily functioning. Objective Learn to accept limitations in life and commit to tolerating, rather than avoiding, unpleasant emotions while accomplishing meaningful goals. Target Date: 2023-03-01 Frequency: Biweekly Progress: 10 Modality: individual Related Interventions 1. Use techniques from Acceptance and Commitment Therapy to help client accept uncomfortable realities such as lack of complete control, imperfections, and uncertainty and tolerate unpleasant emotions and thoughts in order to accomplish value-consistent goals. Objective Learn and implement problem-solving strategies for realistically addressing worries. Target Date: 2023-03-01 Frequency: Biweekly Progress: 10 Modality: individual Related Interventions 1. Assign the client a homework exercise in which he/she  problem-solves a current problem.  review, reinforce success, and provide corrective feedback toward improvement. 2. Teach the client problem-solving strategies involving specifically defining a problem, generating options for addressing it,  evaluating the pros and cons of each option, selecting and implementing an optional action, and reevaluating and refining the action. Objective Learn and implement calming skills to reduce overall anxiety and manage anxiety symptoms. Target Date: 2023-03-01 Frequency: Biweekly Progress: 10 Modality: individual Related Interventions 1. Assign the client to read about progressive muscle relaxation and other calming strategies in relevant books or treatment manuals (e.g., Progressive Relaxation Training by Robb Matar and Alen Blew; Mastery of Your Anxiety and Worry: Workbook by Earlie Counts). 2. Assign the client homework each session in which he/she practices relaxation exercises daily, gradually applying them progressively from non-anxiety-provoking to anxiety-provoking situations; review and reinforce success while providing corrective feedback toward improvement. 3. Teach the client calming/relaxation skills (e.g., applied relaxation, progressive muscle relaxation, cue controlled relaxation; mindful breathing; biofeedback) and how to discriminate better between relaxation and tension; teach the client how to apply these skills to his/her daily life. 3. Reduce overall frequency, intensity, and duration of the anxiety so that daily functioning is not impaired. 4. Resolve the core conflict that is the source of anxiety. 5. Stabilize anxiety level while increasing ability to function on a daily basis. Diagnosis :    F43.22 Conditions For Discharge Achievement of treatment goals and objectives.  Cheyenne Ow, LCSW

## 2022-06-02 ENCOUNTER — Ambulatory Visit (INDEPENDENT_AMBULATORY_CARE_PROVIDER_SITE_OTHER): Payer: Medicare HMO | Admitting: Family Medicine

## 2022-06-02 ENCOUNTER — Encounter (INDEPENDENT_AMBULATORY_CARE_PROVIDER_SITE_OTHER): Payer: Self-pay | Admitting: Family Medicine

## 2022-06-02 ENCOUNTER — Ambulatory Visit: Payer: Medicare HMO | Admitting: Internal Medicine

## 2022-06-02 VITALS — BP 121/72 | HR 70 | Temp 98.3°F | Ht 63.0 in | Wt 211.0 lb

## 2022-06-02 DIAGNOSIS — I10 Essential (primary) hypertension: Secondary | ICD-10-CM | POA: Diagnosis not present

## 2022-06-02 DIAGNOSIS — R7303 Prediabetes: Secondary | ICD-10-CM | POA: Diagnosis not present

## 2022-06-02 DIAGNOSIS — Z6837 Body mass index (BMI) 37.0-37.9, adult: Secondary | ICD-10-CM | POA: Diagnosis not present

## 2022-06-02 NOTE — Progress Notes (Signed)
[name], D.O.  ABFM, ABOM Specializing in Clinical Bariatric Medicine  Office located at: [address] [place], [place]  [address]     Assessment and Plan:    Prediabetes Assessment: Condition is not optimized. Lab Results  Component Value Date   HGBA1C 6.1 (H) 04/28/2022   HGBA1C 6.0 (H) 12/08/2021   HGBA1C 5.9 (H) 07/27/2021   INSULIN 17.6 12/08/2021   INSULIN 16.9 07/27/2021  - This condition is diet controlled. Patient is not on any prediabetic medication.  - Cravings and hunger are controlled when eating on plan.   Plan: -  Continue her prudent nutritional plan that is low in simple carbohydrates, saturated fats and trans fats to goal of 5-10% weight loss to achieve significant health benefits.  Pt encouraged to continually advance exercise and cardiovascular fitness as tolerated throughout weight loss journey.    Essential hypertension Assessment: Condition is stable. Last 3 blood pressure readings in our office are as follows: BP Readings from Last 3 Encounters:  06/02/22 121/72  05/11/22 136/67  04/28/22 133/83  - Her blood pressure is stable today. No concerns - She is currently not on any antihypertensive medication.   Plan: - Continue with Prudent nutritional plan and low sodium diet, advance exercise as tolerated.   - Ambulatory blood pressure monitoring encouraged.  Reminded patient that if they ever feel poorly in any way, to check their blood pressure and pulse as well. - We will continue to monitor symptoms as they relate to her weight loss journey.    TREATMENT PLAN FOR OBESITY: BMI 37.0-37.9, adult-current bmi 37.39 Morbid obesity (HCC)-start bmi 38.97/date 07/27/21 Assessment:  Cheyenne Mcdonald is here to discuss her progress with her obesity treatment plan along with follow-up of her obesity related diagnoses. See Medical Weight Management Flowsheet for complete bioelectrical impedance results.  Condition is improving. Biometric  data collected today, was reviewed with patient.   Since last office visit on 05/11/22 patient's Fat mass has decreased by 5.8 lb. Muscle mass has increased by 1.8 lb. Total body water has increased by 2 lb.  Counseling done on how various foods will affect these numbers and how to maximize success  Total lbs lost to date: - 14 Total weight loss percentage to date: 6.22   Plan:  - Continue journaling her intake of [phone] calories and 80+ grams of protein daily with the Category 1 meal plan as a guide.   Behavioral Intervention Additional resources provided today: patient declined Evidence-based interventions for health behavior change were utilized today including the discussion of self monitoring techniques, problem-solving barriers and SMART goal setting techniques.   Regarding patient's less desirable eating habits and patterns, we employed the technique of small changes.  Pt will specifically work on: continuing journaling, walking 60 minutes, 3 days a week, and losing 3 lbs for next visit.    Recommended Physical Activity Goals  Cheyenne Mcdonald has been advised to slowly work up to 150 minutes of moderate intensity aerobic activity a week and strengthening exercises 2-3 times per week for cardiovascular health, weight loss maintenance and preservation of muscle mass.   She has agreed to Think about ways to increase daily physical activity and overcoming barriers to exercise  FOLLOW UP: Return in about 3 weeks (around 06/23/2022). She was informed of the importance of frequent follow up visits to maximize her success with intensive lifestyle modifications for her multiple health conditions.   Subjective:   Chief complaint: Obesity Cheyenne Mcdonald is here to discuss  her progress with her obesity treatment plan. She is on the the Category 1 Plan and keeping a food journal and adhering to recommended goals of [phone] calories and 80+ protein and states she is following her eating plan approximately 70% of  the time. She states she is active in the garden 30 minutes 2 days per week.  Interval History:  Cheyenne Mcdonald is here for a follow up office visit.     Since last office visit:   - From journaling she learned that she is "nailing breakfast" - Overall her journaling values indicate that she is mostly achieving her calorie and protein goals.  - Endorses eating higher caloric foods over the weekend.  - When eating on plan, her hunger and cravings are controlled.  Review of Systems:  Pertinent positives were addressed with patient today.  Weight Summary and Biometrics   Weight Lost Since Last Visit: 4lb  Weight Gained Since Last Visit: 0    Vitals Temp: 98.3 F (36.8 C) BP: 121/72 Pulse Rate: 70 SpO2: 98 %   Anthropometric Measurements Height: 5\' 3"  (1.6 m) Weight: 211 lb (95.7 kg) BMI (Calculated): 37.39 Weight at Last Visit: 215lb Weight Lost Since Last Visit: 4lb Weight Gained Since Last Visit: 0 Starting Weight: 225lb Total Weight Loss (lbs): 9 lb (4.082 kg) Peak Weight: 240lb   Body Composition  Body Fat %: 40.8 % Fat Mass (lbs): 86.4 lbs Muscle Mass (lbs): 118.8 lbs Total Body Water (lbs): 84.6 lbs Visceral Fat Rating : 14   Other Clinical Data Fasting: No Labs: No Today's Visit #: 15 Starting Date: 07/27/21   Objective:   PHYSICAL EXAM: Blood pressure 121/72, pulse 70, temperature 98.3 F (36.8 C), height 5\' 3"  (1.6 m), weight 211 lb (95.7 kg), SpO2 98 %. Body mass index is 37.38 kg/m.  General: Well Developed, well nourished, and in no acute distress.  HEENT: Normocephalic, atraumatic Skin: Warm and dry, cap RF less 2 sec, good turgor Chest:  Normal excursion, shape, no gross abn Respiratory: speaking in full sentences, no conversational dyspnea NeuroM-Sk: Ambulates w/o assistance, moves * 4 Psych: A and O *3, insight good, mood-full  DIAGNOSTIC DATA REVIEWED:  BMET    Component Value Date/Time   NA 141 04/28/2022 0913   K 4.1  04/28/2022 0913   CL 106 04/28/2022 0913   CO2 21 04/28/2022 0913   GLUCOSE 94 04/28/2022 0913   GLUCOSE 96 03/03/2022 1026   BUN 29 (H) 04/28/2022 0913   CREATININE 1.16 (H) 04/28/2022 0913   CREATININE 1.17 (H) 08/31/2021 1427   CALCIUM 9.9 04/28/2022 0913   GFRNONAA 47 (L) 03/03/2022 1026   GFRNONAA 50 (L) 08/31/2021 1427   GFRAA 58 (L) 10/09/2019 0806   Lab Results  Component Value Date   HGBA1C 6.1 (H) 04/28/2022   HGBA1C 5.9 (H) 07/27/2021   Lab Results  Component Value Date   INSULIN 17.6 12/08/2021   INSULIN 16.9 07/27/2021   Lab Results  Component Value Date   TSH 2.200 07/27/2021   CBC    Component Value Date/Time   WBC 4.4 03/03/2022 1026   RBC 4.20 03/03/2022 1026   HGB 13.4 03/03/2022 1026   HGB 13.2 08/31/2021 1427   HGB 12.9 07/27/2021 1021   HCT 38.7 03/03/2022 1026   HCT 37.3 07/27/2021 1021   PLT 159 03/03/2022 1026   PLT 150 08/31/2021 1427   PLT 139 (L) 07/27/2021 1021   MCV 92.1 03/03/2022 1026   MCV 93 07/27/2021 1021  MCH 31.9 03/03/2022 1026   MCHC 34.6 03/03/2022 1026   RDW 12.5 03/03/2022 1026   RDW 12.5 07/27/2021 1021   Iron Studies No results found for: "IRON", "TIBC", "FERRITIN", "IRONPCTSAT" Lipid Panel     Component Value Date/Time   CHOL 135 12/08/2021 0906   TRIG 62 12/08/2021 0906   HDL 49 12/08/2021 0906   LDLCALC 73 12/08/2021 0906   Hepatic Function Panel     Component Value Date/Time   PROT 6.9 03/03/2022 1026   PROT 6.3 12/08/2021 0906   ALBUMIN 4.1 03/03/2022 1026   ALBUMIN 4.1 12/08/2021 0906   AST 16 03/03/2022 1026   AST 15 08/31/2021 1427   ALT 13 03/03/2022 1026   ALT 14 08/31/2021 1427   ALKPHOS 50 03/03/2022 1026   BILITOT 0.4 03/03/2022 1026   BILITOT 0.2 12/08/2021 0906   BILITOT 0.4 08/31/2021 1427      Component Value Date/Time   TSH 2.200 07/27/2021 1021   Nutritional Lab Results  Component Value Date   VD25OH 62.7 04/28/2022   VD25OH 67.3 12/08/2021   VD25OH 55.4 07/27/2021     Attestations:   Reviewed by clinician on day of visit: allergies, medications, problem list, medical history, surgical history, family history, social history, and previous encounter notes.  Patient was in the office today and time spent on visit including pre-visit chart review and post-visit care/coordination of care and electronic medical record documentation was 30 minutes. 50% of that time was in face to face counseling of this patient's medical condition(s) and providing education on treatment options to always include the first-line treatment of diet and lifestyle modification.    I,Special Puri,acting as a Neurosurgeon for [employer], DO.,have documented all relevant documentation on the behalf of Thomasene Lot, DO,as directed by  Thomasene Lot, DO while in the presence of Thomasene Lot, DO.   I, Thomasene Lot, DO, have reviewed all documentation for this visit. The documentation on 06/02/22 for the exam, diagnosis, procedures, and orders are all accurate and complete.

## 2022-06-08 DIAGNOSIS — G4733 Obstructive sleep apnea (adult) (pediatric): Secondary | ICD-10-CM | POA: Diagnosis not present

## 2022-06-08 DIAGNOSIS — E559 Vitamin D deficiency, unspecified: Secondary | ICD-10-CM | POA: Diagnosis not present

## 2022-06-08 DIAGNOSIS — E782 Mixed hyperlipidemia: Secondary | ICD-10-CM | POA: Diagnosis not present

## 2022-06-08 DIAGNOSIS — E669 Obesity, unspecified: Secondary | ICD-10-CM | POA: Diagnosis not present

## 2022-06-08 DIAGNOSIS — D0510 Intraductal carcinoma in situ of unspecified breast: Secondary | ICD-10-CM | POA: Diagnosis not present

## 2022-06-08 DIAGNOSIS — N182 Chronic kidney disease, stage 2 (mild): Secondary | ICD-10-CM | POA: Diagnosis not present

## 2022-06-08 DIAGNOSIS — M1711 Unilateral primary osteoarthritis, right knee: Secondary | ICD-10-CM | POA: Diagnosis not present

## 2022-06-08 DIAGNOSIS — R7303 Prediabetes: Secondary | ICD-10-CM | POA: Diagnosis not present

## 2022-06-14 ENCOUNTER — Ambulatory Visit (HOSPITAL_BASED_OUTPATIENT_CLINIC_OR_DEPARTMENT_OTHER): Payer: Medicare HMO | Attending: Internal Medicine | Admitting: Internal Medicine

## 2022-06-14 DIAGNOSIS — G4733 Obstructive sleep apnea (adult) (pediatric): Secondary | ICD-10-CM | POA: Insufficient documentation

## 2022-06-20 ENCOUNTER — Ambulatory Visit (INDEPENDENT_AMBULATORY_CARE_PROVIDER_SITE_OTHER): Payer: Medicare HMO | Admitting: Family Medicine

## 2022-06-20 ENCOUNTER — Encounter (INDEPENDENT_AMBULATORY_CARE_PROVIDER_SITE_OTHER): Payer: Self-pay | Admitting: Family Medicine

## 2022-06-20 VITALS — BP 102/74 | HR 77 | Temp 97.9°F | Ht 63.5 in | Wt 208.0 lb

## 2022-06-20 DIAGNOSIS — N1831 Chronic kidney disease, stage 3a: Secondary | ICD-10-CM | POA: Diagnosis not present

## 2022-06-20 DIAGNOSIS — E538 Deficiency of other specified B group vitamins: Secondary | ICD-10-CM | POA: Diagnosis not present

## 2022-06-20 DIAGNOSIS — R7303 Prediabetes: Secondary | ICD-10-CM | POA: Diagnosis not present

## 2022-06-20 DIAGNOSIS — Z6837 Body mass index (BMI) 37.0-37.9, adult: Secondary | ICD-10-CM

## 2022-06-20 DIAGNOSIS — Z6836 Body mass index (BMI) 36.0-36.9, adult: Secondary | ICD-10-CM | POA: Diagnosis not present

## 2022-06-20 NOTE — Progress Notes (Signed)
Cheyenne Mcdonald, D.O.  ABFM, ABOM Specializing in Clinical Bariatric Medicine  Office located at: [address] [place], [place]  [address]     Assessment and Plan:   Stage 3a Chronic Kidney Disease (HCC) Assessment: Her blood pressure is stable today. No concerns. She is not taking any blood pressure medicines currently.   Last 3 blood pressure readings in our office are as follows: BP Readings from Last 3 Encounters:  06/20/22 102/74  06/02/22 121/72  05/11/22 136/67   Plan: Again reminded pt to properly hydrate (100+ ounces of water daily), exercise for increased blood flow, and avoid nephrotoxic substances to manage her Stage 3a CKD. Continue with Prudent nutritional plan and low sodium diet, advance exercise as tolerated.  We will continue to monitor symptoms as they relate to the her weight loss journey.    Prediabetes Assessment: Condition is diet controlled and worsening. Pt endorses that her cravings are controlled, however notes having hunger in the mornings when she does not eat enough protein the night before.   Lab Results  Component Value Date   HGBA1C 6.1 (H) 04/28/2022   HGBA1C 6.0 (H) 12/08/2021   HGBA1C 5.9 (H) 07/27/2021   INSULIN 17.6 12/08/2021   INSULIN 16.9 07/27/2021    Plan :Cheyenne Mcdonald will continue to work on weight loss, exercise, via their meal plan we devised to help decrease the risk of progressing to diabetes. We will recheck A1c and fasting insulin level in approximately 3 months from last check, or as deemed appropriate.     B12 deficiency Assessment: Condition is at goal. She has been compliant and tolerant with OTC Vitamin B12 300-500 mcg daily. Denies any adverse effects.   Lab Results  Component Value Date   VITAMINB12 860 12/08/2021   Plan: Continue with OTC supplement.Continue their prudent nutritional plan and focus on b12 rich foods such as lean red meats; poultry; eggs; seafood; beans, peas, and lentils; nuts and seeds; and soy  products. We will continue to monitor as deemed clinically necessary.   TREATMENT PLAN FOR OBESITY: BMI 37.0-37.9, adult-current bmi 36.26 Morbid Obesity (HCC)-start bmi 38.97/date 07/27/21 Assessment:  Cheyenne Mcdonald is here to discuss her progress with her obesity treatment plan along with follow-up of her obesity related diagnoses. See Medical Weight Management Flowsheet for complete bioelectrical impedance results.  Condition is improving, but not optimized. Biometric data collected today, was reviewed with patient.   Since last office visit on 06/02/22 patient's  Muscle mass has decreased by 1.6 lb. Fat mass has decreased by 1.2 lb. Total body water has decreased by 5.2 lb.  Counseling done on how various foods will affect these numbers and how to maximize success  Total lbs lost to date: 17  Total weight loss percentage to date: 7.56    Plan:  - Continue with the Category 1 Plan and/or keeping a food journal and adhering to recommended goals of [phone] calories and 80+ protein  Behavioral Intervention Additional resources provided today: patient declined Evidence-based interventions for health behavior change were utilized today including the discussion of self monitoring techniques, problem-solving barriers and SMART goal setting techniques.   Regarding patient's less desirable eating habits and patterns, we employed the technique of small changes.  Pt will specifically work on: walking 60 minutes, 3 days a week for next visit.    Recommended Physical Activity Goals  Cheyenne Mcdonald has been advised to slowly work up to 150 minutes of moderate intensity aerobic activity a week and strengthening exercises  2-3 times per week for cardiovascular health, weight loss maintenance and preservation of muscle mass.   She has agreed to Continue current level of physical activity   FOLLOW UP: Return in about 4 weeks (around 07/18/2022). She was informed of the importance of frequent follow up visits to  maximize her success with intensive lifestyle modifications for her multiple health conditions.   Subjective:   Chief complaint: Obesity Cheyenne Mcdonald is here to discuss her progress with her obesity treatment plan. She is on the the Category 1 Plan and/or keeping a food journal and adhering to recommended goals of [phone] calories and 80+ protein and states she is following her eating plan approximately 85% of the time. She states she is walking 45 minutes 1 days per week.  Interval History:  Cheyenne Mcdonald is here for a follow up office visit.     Since last office visit:    - Cheyenne Mcdonald feels that her clothes are feeling slightly looser.   - Reports eating ~ 80 grams of protein daily.   - Ate of plan on a few celebratory occasions like graduations.   - She has been following the Category 1 meal plan more than journaling.   - She has an upcoming vacation to [place].   Review of Systems:  Pertinent positives were addressed with patient today.  Weight Summary and Biometrics   Weight Lost Since Last Visit: 3lb  Weight Gained Since Last Visit: 0    Vitals Temp: 97.9 F (36.6 C) BP: 102/74 Pulse Rate: 77 SpO2: 96 %   Anthropometric Measurements Height: 5' 3.5" (1.613 m) Weight: 208 lb (94.3 kg) BMI (Calculated): 36.26 Weight at Last Visit: 211lb Weight Lost Since Last Visit: 3lb Weight Gained Since Last Visit: 0 Starting Weight: 225lb Total Weight Loss (lbs): 7 lb (3.175 kg) Peak Weight: 240lb   Body Composition  Body Fat %: 40.8 % Fat Mass (lbs): 85.2 lbs Muscle Mass (lbs): 117.2 lbs Total Body Water (lbs): 79.4 lbs Visceral Fat Rating : 14   No data recorded   Objective:   PHYSICAL EXAM: Blood pressure 102/74, pulse 77, temperature 97.9 F (36.6 C), height 5' 3.5" (1.613 m), weight 208 lb (94.3 kg), SpO2 96 %. Body mass index is 36.27 kg/m.  General: Well Developed, well nourished, and in no acute distress.  HEENT: Normocephalic, atraumatic Skin: Warm and  dry, cap RF less 2 sec, good turgor Chest:  Normal excursion, shape, no gross abn Respiratory: speaking in full sentences, no conversational dyspnea NeuroM-Sk: Ambulates w/o assistance, moves * 4 Psych: A and O *3, insight good, mood-full  DIAGNOSTIC DATA REVIEWED:  BMET    Component Value Date/Time   NA 141 04/28/2022 0913   K 4.1 04/28/2022 0913   CL 106 04/28/2022 0913   CO2 21 04/28/2022 0913   GLUCOSE 94 04/28/2022 0913   GLUCOSE 96 03/03/2022 1026   BUN 29 (H) 04/28/2022 0913   CREATININE 1.16 (H) 04/28/2022 0913   CREATININE 1.17 (H) 08/31/2021 1427   CALCIUM 9.9 04/28/2022 0913   GFRNONAA 47 (L) 03/03/2022 1026   GFRNONAA 50 (L) 08/31/2021 1427   GFRAA 58 (L) 10/09/2019 0806   Lab Results  Component Value Date   HGBA1C 6.1 (H) 04/28/2022   HGBA1C 5.9 (H) 07/27/2021   Lab Results  Component Value Date   INSULIN 17.6 12/08/2021   INSULIN 16.9 07/27/2021   Lab Results  Component Value Date   TSH 2.200 07/27/2021   CBC    Component Value Date/Time  WBC 4.4 03/03/2022 1026   RBC 4.20 03/03/2022 1026   HGB 13.4 03/03/2022 1026   HGB 13.2 08/31/2021 1427   HGB 12.9 07/27/2021 1021   HCT 38.7 03/03/2022 1026   HCT 37.3 07/27/2021 1021   PLT 159 03/03/2022 1026   PLT 150 08/31/2021 1427   PLT 139 (L) 07/27/2021 1021   MCV 92.1 03/03/2022 1026   MCV 93 07/27/2021 1021   MCH 31.9 03/03/2022 1026   MCHC 34.6 03/03/2022 1026   RDW 12.5 03/03/2022 1026   RDW 12.5 07/27/2021 1021   Iron Studies No results found for: "IRON", "TIBC", "FERRITIN", "IRONPCTSAT" Lipid Panel     Component Value Date/Time   CHOL 135 12/08/2021 0906   TRIG 62 12/08/2021 0906   HDL 49 12/08/2021 0906   LDLCALC 73 12/08/2021 0906   Hepatic Function Panel     Component Value Date/Time   PROT 6.9 03/03/2022 1026   PROT 6.3 12/08/2021 0906   ALBUMIN 4.1 03/03/2022 1026   ALBUMIN 4.1 12/08/2021 0906   AST 16 03/03/2022 1026   AST 15 08/31/2021 1427   ALT 13 03/03/2022 1026    ALT 14 08/31/2021 1427   ALKPHOS 50 03/03/2022 1026   BILITOT 0.4 03/03/2022 1026   BILITOT 0.2 12/08/2021 0906   BILITOT 0.4 08/31/2021 1427      Component Value Date/Time   TSH 2.200 07/27/2021 1021   Nutritional Lab Results  Component Value Date   VD25OH 62.7 04/28/2022   VD25OH 67.3 12/08/2021   VD25OH 55.4 07/27/2021    Attestations:   Reviewed by clinician on day of visit: allergies, medications, problem list, medical history, surgical history, family history, social history, and previous encounter notes.  I,Special Puri,acting as a Neurosurgeon for [employer], DO.,have documented all relevant documentation on the behalf of Thomasene Lot, DO,as directed by  Thomasene Lot, DO while in the presence of Thomasene Lot, DO.   I, Thomasene Lot, DO, have reviewed all documentation for this visit. The documentation on 06/20/22 for the exam, diagnosis, procedures, and orders are all accurate and complete.

## 2022-06-26 DIAGNOSIS — G4733 Obstructive sleep apnea (adult) (pediatric): Secondary | ICD-10-CM

## 2022-06-26 NOTE — Procedures (Signed)
    Patient Name: Cheyenne Mcdonald, Cheyenne Mcdonald Date: 06/14/2022 Gender: Female D.O.B: May 19, 1950 Age (years): 72 Referring Provider: Jetty Duhamel MD, ABSM Height (inches): 64 Interpreting Physician: Jetty Duhamel MD, ABSM Weight (lbs): 210 RPSGT: Armen Pickup BMI: 37 MRN: [id number] Neck Size: 15.00  CLINICAL INFORMATION Sleep Study Type: NPSG Indication for sleep study: OSA Epworth Sleepiness Score: 7  SLEEP STUDY TECHNIQUE As per the AASM Manual for the Scoring of Sleep and Associated Events v2.3 (April 2016) with a hypopnea requiring 4% desaturations.  The channels recorded and monitored were frontal, central and occipital EEG, electrooculogram (EOG), submentalis EMG (chin), nasal and oral airflow, thoracic and abdominal wall motion, anterior tibialis EMG, snore microphone, electrocardiogram, and pulse oximetry.  MEDICATIONS Medications self-administered by patient taken the night of the study : none reported  SLEEP ARCHITECTURE The study was initiated at 10:25:27 PM and ended at 4:37:57 AM.  Sleep onset time was 10.9 minutes and the sleep efficiency was 67.0%. The total sleep time was 249.6 minutes.  Stage REM latency was 72.5 minutes.  The patient spent 2.0% of the night in stage N1 sleep, 69.6% in stage N2 sleep, 0.2% in stage N3 and 28.3% in REM.  Alpha intrusion was absent.  Supine sleep was 1.80%.  RESPIRATORY PARAMETERS The overall apnea/hypopnea index (AHI) was 7.7 per hour. There were 16 total apneas, including 16 obstructive, 0 central and 0 mixed apneas. There were 16 hypopneas and 8 RERAs.  The AHI during Stage REM sleep was 24.7 per hour.  AHI while supine was 0.0 per hour.  The mean oxygen saturation was 94.2%. The minimum SpO2 during sleep was 81.0%.  moderate snoring was noted during this study.  CARDIAC DATA The 2 lead EKG demonstrated sinus rhythm. The mean heart rate was 68.0 beats per minute. Other EKG findings include: None. LEG MOVEMENT DATA The  total PLMS were 0 with a resulting PLMS index of 0.0. Associated arousal with leg movement index was 1.4 .  IMPRESSIONS - Mild obstructive sleep apnea occurred during this study (AHI = 7.7/h). - Mild oxygen desaturation was noted during this study (Min O2 = 81.0%, Mean 94.2%). - The patient snored with moderate snoring volume. - No cardiac abnormalities were noted during this study. - Frequent limb movements (187), but rarely caused arousal or awakening (total 6, 1.4/ hr).  DIAGNOSIS - Obstructive Sleep Apnea (G47.33)  RECOMMENDATIONS - Conservative management of OSA may include observation, weight loss and sleep position off back. Other options including CPAP or a fitted oral appliance would be based on clinical judgment. - Avoid alcohol, sedatives and other CNS depressants that may worsen sleep apnea and disrupt normal sleep architecture. - Sleep hygiene should be reviewed to assess factors that may improve sleep quality. - Weight management and regular exercise should be initiated or continued if appropriate.  [Electronically signed] 06/26/2022 11:06 AM  Jetty Duhamel MD, ABSM Diplomate, American Board of Sleep Medicine NPI: [id number]                         Jetty Duhamel Diplomate, American Board of Sleep Medicine  ELECTRONICALLY SIGNED ON:  06/26/2022, 10:59 AM [facility] PH: [phone]   FX: [phone] ACCREDITED BY THE AMERICAN ACADEMY OF SLEEP MEDICINE

## 2022-07-04 ENCOUNTER — Ambulatory Visit (INDEPENDENT_AMBULATORY_CARE_PROVIDER_SITE_OTHER): Payer: Medicare HMO | Admitting: Psychology

## 2022-07-04 DIAGNOSIS — K640 First degree hemorrhoids: Secondary | ICD-10-CM | POA: Diagnosis not present

## 2022-07-04 DIAGNOSIS — Z1211 Encounter for screening for malignant neoplasm of colon: Secondary | ICD-10-CM | POA: Diagnosis not present

## 2022-07-04 DIAGNOSIS — F4322 Adjustment disorder with anxiety: Secondary | ICD-10-CM | POA: Diagnosis not present

## 2022-07-04 DIAGNOSIS — K648 Other hemorrhoids: Secondary | ICD-10-CM | POA: Diagnosis not present

## 2022-07-04 DIAGNOSIS — K5730 Diverticulosis of large intestine without perforation or abscess without bleeding: Secondary | ICD-10-CM | POA: Diagnosis not present

## 2022-07-04 NOTE — Progress Notes (Signed)
[facility] Counselor/Therapist Progress Note  Patient ID: EILIYAH REH, MRN: [id number],    Date: 07/04/2022  Time Spent: 3:00pm-3:45pm    45 minutes   Treatment Type: Individual Therapy  Reported Symptoms: worrying.  Mental Status Exam: Appearance:  Casual     Behavior: Appropriate  Motor: Normal  Speech/Language:  Normal Rate  Affect: Appropriate  Mood: normal  Thought process: normal  Thought content:   WNL  Sensory/Perceptual disturbances:   WNL  Orientation: oriented to person, place, time/date, and situation  Attention: Good  Concentration: Good  Memory: WNL  Fund of knowledge:  Good  Insight:   Good  Judgment:  Good  Impulse Control: Good   Risk Assessment: Danger to Self:  No Self-injurious Behavior: No Danger to Others: No Duty to Warn:no Physical Aggression / Violence:No  Access to Firearms a concern: No  Gang Involvement:No   Subjective: Pt present for face-to-face individual therapy via video.  Pt consents to telehealth video session and is aware of limitations of virtual sessions. Location of pt: home Location of therapist: home office.  Pt talked about being busy.  She applied for her passport to prepare for her trip to [place] that she is going to go on next year.  Pt has started taking a [place] class.   Pt talked about keeping busy as a distraction and way to cope with life's stressors.    Worked on additional healthy coping skills.   Pt talked about her health.   She had a colonoscopy today for routine screening.  The results were fine.  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   Addressed the issues and helped pt process her feelings. Worked on self care strategies.   Provided supportive therapy.     Interventions: Cognitive Behavioral Therapy and Insight-Oriented  Diagnosis: F43.22  Plan of Care: Recommend ongoing therapy.  Pt participated in setting treatment goals.  Pt states her  goals are to have a safe place to talk and to work on acceptance and to improve coping skills.   Plan to meet every two weeks.   Treatment Plan  (target date: 03/01/2023) Client Abilities/Strengths  Pt is bright, engaging and motivated for therapy.  Client Treatment Preferences  Individual therapy.  Client Statement of Needs  Improve coping skills.  Symptoms  Excessive and/or unrealistic worry that is difficult to control occurring more days than not for at least 6 months about a number of events or activities.  Problems Addressed  Anxiety Goals 1. Enhance ability to effectively cope with the full variety of life's worries and anxieties. 2. Learn and implement coping skills that result in a reduction of anxiety and worry, and improved daily functioning. Objective Learn to accept limitations in life and commit to tolerating, rather than avoiding, unpleasant emotions while accomplishing meaningful goals. Target Date: 2023-03-01 Frequency: Biweekly Progress: 10 Modality: individual Related Interventions 1. Use techniques from Acceptance and Commitment Therapy to help client accept uncomfortable realities such as lack of complete control, imperfections, and uncertainty and tolerate unpleasant emotions and thoughts in order to accomplish value-consistent goals. Objective Learn and implement problem-solving strategies for realistically addressing worries. Target Date: 2023-03-01 Frequency: Biweekly Progress: 10 Modality: individual Related Interventions 1. Assign the client a homework exercise in which he/she problem-solves a current problem.  review, reinforce success, and provide corrective feedback toward improvement. 2. Teach the client problem-solving strategies involving specifically defining a problem, generating options for addressing it, evaluating the pros  and cons of each option, selecting and implementing an optional action, and reevaluating and refining the action. Objective Learn  and implement calming skills to reduce overall anxiety and manage anxiety symptoms. Target Date: 2023-03-01 Frequency: Biweekly Progress: 10 Modality: individual Related Interventions 1. Assign the client to read about progressive muscle relaxation and other calming strategies in relevant books or treatment manuals (e.g., Progressive Relaxation Training by Robb Matar and Alen Blew; Mastery of Your Anxiety and Worry: Workbook by Earlie Counts). 2. Assign the client homework each session in which he/she practices relaxation exercises daily, gradually applying them progressively from non-anxiety-provoking to anxiety-provoking situations; review and reinforce success while providing corrective feedback toward improvement. 3. Teach the client calming/relaxation skills (e.g., applied relaxation, progressive muscle relaxation, cue controlled relaxation; mindful breathing; biofeedback) and how to discriminate better between relaxation and tension; teach the client how to apply these skills to his/her daily life. 3. Reduce overall frequency, intensity, and duration of the anxiety so that daily functioning is not impaired. 4. Resolve the core conflict that is the source of anxiety. 5. Stabilize anxiety level while increasing ability to function on a daily basis. Diagnosis :    F43.22 Conditions For Discharge Achievement of treatment goals and objectives.  Demarius Archila, LCSW

## 2022-07-11 ENCOUNTER — Ambulatory Visit: Payer: Medicare HMO | Admitting: Internal Medicine

## 2022-07-18 ENCOUNTER — Ambulatory Visit (INDEPENDENT_AMBULATORY_CARE_PROVIDER_SITE_OTHER): Payer: Medicare HMO | Admitting: Family Medicine

## 2022-07-18 ENCOUNTER — Encounter (INDEPENDENT_AMBULATORY_CARE_PROVIDER_SITE_OTHER): Payer: Self-pay | Admitting: Family Medicine

## 2022-07-18 ENCOUNTER — Ambulatory Visit: Payer: Medicare HMO | Attending: Surgery

## 2022-07-18 VITALS — Wt 210.1 lb

## 2022-07-18 DIAGNOSIS — Z6836 Body mass index (BMI) 36.0-36.9, adult: Secondary | ICD-10-CM

## 2022-07-18 DIAGNOSIS — I10 Essential (primary) hypertension: Secondary | ICD-10-CM | POA: Diagnosis not present

## 2022-07-18 DIAGNOSIS — R7303 Prediabetes: Secondary | ICD-10-CM

## 2022-07-18 DIAGNOSIS — Z483 Aftercare following surgery for neoplasm: Secondary | ICD-10-CM | POA: Insufficient documentation

## 2022-07-18 MED ORDER — VITAMIN D-3 125 MCG (5000 UT) PO TABS: ORAL_TABLET | ORAL | Status: DC

## 2022-07-18 NOTE — Progress Notes (Signed)
Cheyenne Mcdonald, D.O.  ABFM, ABOM Specializing in Clinical Bariatric Medicine  Office located at: [address] [place], [place]  [address]     Assessment and Plan:   Medications Discontinued During This Encounter  Medication Reason   allopurinol (ZYLOPRIM) 100 MG tablet Discontinued by provider   Cholecalciferol (VITAMIN D-3) 125 MCG (5000 UT) TABS Reorder     Check fasting labs next OV and come 30 minutes prior for repeat IC.    Prediabetes Assessment: Condition is not optimized and is diet/exercise controlled. Pt endorses that her cravings are mostly controlled, however notes having hunger in the mornings when it is time to eat.   Lab Results  Component Value Date   HGBA1C 6.1 (H) 04/28/2022   HGBA1C 6.0 (H) 12/08/2021   HGBA1C 5.9 (H) 07/27/2021   INSULIN 17.6 12/08/2021   INSULIN 16.9 07/27/2021    Plan: Continue her prudent nutritional plan that is low in simple carbohydrates, saturated fats and trans fats to goal of 5-10% weight loss to achieve significant health benefits.  Pt encouraged to continually advance exercise and cardiovascular fitness as tolerated throughout weight loss journey.  We will recheck A1c and fasting insulin level in approximately 3 months from last check, or as deemed appropriate.    Essential hypertension Assessment: Her blood pressure is stable. No concerns in this regard today. She is not taking any antihypertensive medication.   Last 3 blood pressure readings in our office are as follows: BP Readings from Last 3 Encounters:  07/18/22 125/79  06/20/22 102/74  06/02/22 121/72   Plan Continue with Prudent nutritional plan and low sodium diet, advance exercise as tolerated.   Ambulatory blood pressure monitoring encouraged.  Reminded patient that if they ever feel poorly in any way, to check their blood pressure and pulse as well.We will continue to monitor closely alongside PCP/ specialists.  Pt reminded to also f/up with those  individuals as instructed by them. We will continue to monitor symptoms as they relate to the her weight loss journey.    TREATMENT PLAN FOR OBESITY: BMI 36.0-36.9,adult-current BMI 36.09 Morbid obesity (HCC)-start bmi 38.97/date 07/27/21 Assessment: Cheyenne Mcdonald is here to discuss her progress with her obesity treatment plan along with follow-up of her obesity related diagnoses. See Medical Weight Management Flowsheet for complete bioelectrical impedance results.  Condition is improving, but not optimized. Biometric data collected today, was reviewed with patient.   Since last office visit on 06/20/22 patient's  Muscle mass has increased by 5.6 lb. Fat mass has decreased by 7.4 lb. Total body water has increased by 6 lb.  Counseling done on how various foods will affect these numbers and how to maximize success  Total lbs lost to date: 19 Total weight loss percentage to date: 8.44   Plan: Continue with the Category 1 Plan and keeping a food journal and adhering to recommended goals of [phone] calories and 80+ protein.  - I again encouraged pt to journal her intake more consistently in order to increase mindfulness and awareness about her eating habits.   Behavioral Intervention Additional resources provided today: Food journaling log handout, [employer] Recipe handout Evidence-based interventions for health behavior change were utilized today including the discussion of self monitoring techniques, problem-solving barriers and SMART goal setting techniques.   Regarding patient's less desirable eating habits and patterns, we employed the technique of small changes.  Pt will specifically work on: journaling more consistently and continuing with walking regiment  for next visit.  Recommended Physical Activity Goals  Cheyenne Mcdonald has been advised to slowly work up to 150 minutes of moderate intensity aerobic activity a week and strengthening exercises 2-3 times per week for cardiovascular  health, weight loss maintenance and preservation of muscle mass.   She has agreed to Continue current level of physical activity   FOLLOW UP: Return in about 3 weeks (around 08/08/2022). She was informed of the importance of frequent follow up visits to maximize her success with intensive lifestyle modifications for her multiple health conditions.  Subjective:   Chief complaint: Obesity Cheyenne Mcdonald is here to discuss her progress with her obesity treatment plan. She is on the the Category 1 Plan and keeping a food journal and adhering to recommended goals of [phone] calories and 80+ protein and states she is following her eating plan approximately 85% of the time. She states she is walking 60 minutes 5 days per week.  Interval History:  Cheyenne Mcdonald is here for a follow up office visit. Since last OV, Cheyenne Mcdonald has been doing well. She endorses sometimes journaling her intake because she typically eats the same foods. When she journals, she reports getting 100+ grams of protein. She notes that her cravings are mostly controlled, but feels hungry when she wakes up.   Review of Systems:  Pertinent positives were addressed with patient today.  Reviewed by clinician on day of visit: allergies, medications, problem list, medical history, surgical history, family history, social history, and previous encounter notes.  Weight Summary and Biometrics   Weight Lost Since Last Visit: 1lb  Weight Gained Since Last Visit: 0lb    Vitals Temp: 98.6 F (37 C) BP: 125/79 Pulse Rate: 62 SpO2: 99 %   Anthropometric Measurements Height: 5' 3.5" (1.613 m) Weight: 207 lb (93.9 kg) BMI (Calculated): 36.09 Weight at Last Visit: 208lb Weight Lost Since Last Visit: 1lb Weight Gained Since Last Visit: 0lb Starting Weight: 225lb Total Weight Loss (lbs): 6 lb (2.722 kg) Peak Weight: 240lb   Body Composition  Body Fat %: 37.5 % Fat Mass (lbs): 77.8 lbs Muscle Mass (lbs): 122.8 lbs Total Body Water (lbs):  85.4 lbs Visceral Fat Rating : 13   Other Clinical Data Fasting: no Labs: yes Today's Visit #: 17 Starting Date: 07/27/21   Objective:   PHYSICAL EXAM: Blood pressure 125/79, pulse 62, temperature 98.6 F (37 C), height 5' 3.5" (1.613 m), weight 207 lb (93.9 kg), SpO2 99 %. Body mass index is 36.09 kg/m.  General: Well Developed, well nourished, and in no acute distress.  HEENT: Normocephalic, atraumatic Skin: Warm and dry, cap RF less 2 sec, good turgor Chest:  Normal excursion, shape, no gross abn Respiratory: speaking in full sentences, no conversational dyspnea NeuroM-Sk: Ambulates w/o assistance, moves * 4 Psych: A and O *3, insight good, mood-full  DIAGNOSTIC DATA REVIEWED:  BMET    Component Value Date/Time   NA 141 04/28/2022 0913   K 4.1 04/28/2022 0913   CL 106 04/28/2022 0913   CO2 21 04/28/2022 0913   GLUCOSE 94 04/28/2022 0913   GLUCOSE 96 03/03/2022 1026   BUN 29 (H) 04/28/2022 0913   CREATININE 1.16 (H) 04/28/2022 0913   CREATININE 1.17 (H) 08/31/2021 1427   CALCIUM 9.9 04/28/2022 0913   GFRNONAA 47 (L) 03/03/2022 1026   GFRNONAA 50 (L) 08/31/2021 1427   GFRAA 58 (L) 10/09/2019 0806   Lab Results  Component Value Date   HGBA1C 6.1 (H) 04/28/2022   HGBA1C 5.9 (H) 07/27/2021   Lab Results  Component Value Date   INSULIN 17.6 12/08/2021   INSULIN 16.9 07/27/2021   Lab Results  Component Value Date   TSH 2.200 07/27/2021   CBC    Component Value Date/Time   WBC 4.4 03/03/2022 1026   RBC 4.20 03/03/2022 1026   HGB 13.4 03/03/2022 1026   HGB 13.2 08/31/2021 1427   HGB 12.9 07/27/2021 1021   HCT 38.7 03/03/2022 1026   HCT 37.3 07/27/2021 1021   PLT 159 03/03/2022 1026   PLT 150 08/31/2021 1427   PLT 139 (L) 07/27/2021 1021   MCV 92.1 03/03/2022 1026   MCV 93 07/27/2021 1021   MCH 31.9 03/03/2022 1026   MCHC 34.6 03/03/2022 1026   RDW 12.5 03/03/2022 1026   RDW 12.5 07/27/2021 1021   Iron Studies No results found for: "IRON",  "TIBC", "FERRITIN", "IRONPCTSAT" Lipid Panel     Component Value Date/Time   CHOL 135 12/08/2021 0906   TRIG 62 12/08/2021 0906   HDL 49 12/08/2021 0906   LDLCALC 73 12/08/2021 0906   Hepatic Function Panel     Component Value Date/Time   PROT 6.9 03/03/2022 1026   PROT 6.3 12/08/2021 0906   ALBUMIN 4.1 03/03/2022 1026   ALBUMIN 4.1 12/08/2021 0906   AST 16 03/03/2022 1026   AST 15 08/31/2021 1427   ALT 13 03/03/2022 1026   ALT 14 08/31/2021 1427   ALKPHOS 50 03/03/2022 1026   BILITOT 0.4 03/03/2022 1026   BILITOT 0.2 12/08/2021 0906   BILITOT 0.4 08/31/2021 1427      Component Value Date/Time   TSH 2.200 07/27/2021 1021   Nutritional Lab Results  Component Value Date   VD25OH 62.7 04/28/2022   VD25OH 67.3 12/08/2021   VD25OH 55.4 07/27/2021    Attestations:   I, Special Puri, acting as a Stage manager for [employer], DO., have compiled all relevant documentation for today's office visit on behalf of Thomasene Lot, DO, while in the presence of [employer], DO.  I have reviewed the above documentation for accuracy and completeness, and I agree with the above. Cheyenne Mcdonald, D.O.  The 21st Century Cures Act was signed into law in 2016 which includes the topic of electronic health records.  This provides immediate access to information in MyChart.  This includes consultation notes, operative notes, office notes, lab results and pathology reports.  If you have any questions about what you read please let us know at your next visit so we can discuss your concerns and take corrective action if need be.  We are right here with you.

## 2022-07-18 NOTE — Therapy (Signed)
OUTPATIENT PHYSICAL THERAPY SOZO SCREENING NOTE   Patient Name: Cheyenne Mcdonald MRN: [id number] DOB:11/29/1950, 72 y.o., female Today's Date: 07/18/2022  PCP: Norm Salt, PA REFERRING PROVIDER: Abigail Miyamoto, MD   PT End of Session - 07/18/22 [phone]     Visit Number 13   # unchanged due to screen only   PT Start Time 0922    PT Stop Time 0926    PT Time Calculation (min) 4 min    Activity Tolerance Patient tolerated treatment well    Behavior During Therapy [facility] for tasks assessed/performed             Past Medical History:  Diagnosis Date   Anemia    Arthritis    gout big toe   Breast cancer (HCC)    left breast IDC, right breast DCIS   History of radiation therapy 07/20/2020   bilateral breasts 06/01/2020-07/20/2020  Dr Antony Blackbird   Hypertension    Kidney problem    Obesity    Personal history of chemotherapy    2022 bilat lumpectomies w/rad w/chemo   Personal history of radiation therapy    2022 bilat lumpectomies w/rad w/chemo   Prediabetes    Sleep apnea    does not use CPAP   Vitamin D deficiency    Past Surgical History:  Procedure Laterality Date   ABDOMINAL HYSTERECTOMY     BREAST LUMPECTOMY Bilateral    2022 bilat lumpectomies w/rad w/chemo   BREAST LUMPECTOMY WITH RADIOACTIVE SEED AND SENTINEL LYMPH NODE BIOPSY Left 04/16/2020   Procedure: LEFT BREAST LUMPECTOMY WITH RADIOACTIVE SEED AND LEFT AXILLARY SENTINEL LYMPH NODE BIOPSY;  Surgeon: Abigail Miyamoto, MD;  Location: [facility];  Service: General;  Laterality: Left;   BREAST LUMPECTOMY WITH RADIOACTIVE SEED LOCALIZATION Right 04/16/2020   Procedure: RIGHT BREAST LUMPECTOMY WITH RADIOACTIVE SEED LOCALIZATION;  Surgeon: Abigail Miyamoto, MD;  Location: [facility];  Service: General;  Laterality: Right;   IR IMAGING GUIDED PORT INSERTION  10/18/2019   PORT-A-CATH REMOVAL Right 09/02/2020   Procedure: REMOVAL PORT-A-CATH;  Surgeon: Abigail Miyamoto, MD;   Location: [facility];  Service: General;  Laterality: Right;   Patient Active Problem List   Diagnosis Date Noted   Stage 3a chronic kidney disease (HCC) - elevated serum crt 05/11/2022   Elevated serum creatinine 04/28/2022   Adjustment reaction to stress, with emotional eating 04/12/2022   OSA (obstructive sleep apnea) 04/01/2022   Adjustment disorder 02/21/2022   BMI 37.0-37.9, adult-current bmi 37.9 02/10/2022   Morbid obesity (HCC)-start bmi 38.97 02/10/2022   B12 deficiency 12/08/2021   Class 2 severe obesity with serious comorbidity and body mass index (BMI) of 39.0 to 39.9 in adult [facility]) 12/08/2021   Vitamin D deficiency 07/27/2021   Primary hypertension 07/27/2021   Prediabetes 07/27/2021   Ductal carcinoma in situ (DCIS) of right breast 05/06/2020   Genetic testing 11/04/2019   Port-A-Cath in place 10/22/2019   Malignant neoplasm of upper-outer quadrant of left breast in female, estrogen receptor negative (HCC) 10/03/2019   Unspecified arthropathy, ankle and foot 09/24/2009   HALLUX RIGIDUS 09/24/2009    REFERRING DIAG: left breast cancer at risk for lymphedema  THERAPY DIAG:  Aftercare following surgery for neoplasm  PERTINENT HISTORY: Patient was diagnosed on 09/30/2019 with 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 It measures 9 mm and 1.4 cm both located in the upper outer quadrant. Ki67 is 90%; Lt lumpectomy and SLNB and  Rt DCIS lumpectomy 04/16/2020.   PRECAUTIONS: left UE Lymphedema risk, None  SUBJECTIVE: Pt returns for her 6 month L-Dex screen. "I've been working out and have been feeling really good."  PAIN:  Are you having pain? No  SOZO SCREENING: Patient was assessed today using the SOZO machine to determine the lymphedema index score. This was compared to her baseline score. It was determined that she is within the recommended range when compared to her baseline and no further action is  needed at this time. She will continue SOZO screenings. These are done every 3 months for 2 years post operatively followed by every 6 months for 2 years, and then annually.     L-DEX FLOWSHEETS - 07/18/22 0900       L-DEX LYMPHEDEMA SCREENING   Measurement Type Unilateral    L-DEX MEASUREMENT EXTREMITY Upper Extremity    POSITION  Standing    DOMINANT SIDE Left    At Risk Side Right    BASELINE SCORE (UNILATERAL) 3.3    L-DEX SCORE (UNILATERAL) 4.3    VALUE CHANGE (UNILAT) 1               Hermenia Bers, PTA 07/18/2022, 9:27 AM

## 2022-08-03 NOTE — Progress Notes (Signed)
04/01/22- 71 yoF never smoker for sleep evaluation with concern of OSA Medical problem list includes HTN, Arthropathy, hx R Breast Cancer L, Obesity,  Epworth score-8 Body weight today- Had 2 sleep studies in past. Used CPAP until recalled during Covid. Husband tells her she snores.  She slept better with less daytime tiredness when she had CPAP. Dozes off readily if reading and has passenger in car.  No sleep medications and little caffeine. History of nasal fracture as a child but no ENT surgery.  Denies lung disease. Denies heart disease other than longstanding cardiac murmur.  In past was treated for high blood pressure. Now working with [facility] trying to get her weight down.  Apparently she weighed a lot more at some time in the past. She asks about [employer], which we discussed.  08/04/22-  71 yoF never smoker followed for OSA, complicated by  HTN, Arthropathy, hx R Breast Cancer L, Obesity,  NPSG 06/14/22- AHI 7.7/ hr, desaturation to 81%, body weight 210 lbs Body weight today- For treatment decision She is ready to work on her weight as part of conservative management for her very mild OSA. Other interventions can be chosen later if weight loss is insufficient or ineffective.  ROS-see HPI   + = positive Constitutional:    weight loss, night sweats, fevers, chills, fatigue, lassitude. HEENT:    headaches, difficulty swallowing, tooth/dental problems, sore throat,       sneezing, itching, ear ache, nasal congestion, post nasal drip, snoring CV:    chest pain, orthopnea, PND, swelling in lower extremities, anasarca,                                   dizziness, palpitations Resp:   shortness of breath with exertion or at rest.                productive cough,   non-productive cough, coughing up of blood.              change in color of mucus.  wheezing.   Skin:    rash or lesions. GI:  No-   heartburn, indigestion, abdominal pain, nausea, vomiting, diarrhea,                  change in bowel habits, loss of appetite GU: dysuria, change in color of urine, no urgency or frequency.   flank pain. MS:   joint pain, stiffness, decreased range of motion, back pain. Neuro-     nothing unusual Psych:  change in mood or affect.  depression or anxiety.   memory loss.  OBJ- Physical Exam General- Alert, Oriented, Affect-appropriate, Distress- none acute, +obese Skin- rash-none, lesions- none, excoriation- none Lymphadenopathy- none Head- atraumatic            Eyes- Gross vision intact, PERRLA, conjunctivae and secretions clear            Ears- Hearing, canals-normal            Nose- Clear, no-Septal dev, mucus, polyps, erosion, perforation             Throat- Mallampati II , mucosa clear , drainage- none, tonsils- atrophic Neck- flexible , trachea midline, no stridor , thyroid nl, carotid no bruit Chest - symmetrical excursion , unlabored           Heart/CV- RRR ,  murmur+faint S , no gallop  , no rub, nl s1  s2                           - JVD- none , edema- none, stasis changes- none, varices- none           Lung- clear to P&A, wheeze- none, cough- none , dullness-none, rub- none           Chest wall-  Abd-  Br/ Gen/ Rectal- Not done, not indicated Extrem- cyanosis- none, clubbing, none, atrophy- none, strength- nl Neuro- grossly intact to observation

## 2022-08-04 ENCOUNTER — Ambulatory Visit: Payer: Medicare HMO | Admitting: Internal Medicine

## 2022-08-04 ENCOUNTER — Encounter: Payer: Self-pay | Admitting: Internal Medicine

## 2022-08-04 VITALS — BP 118/76 | HR 68 | Ht 63.5 in | Wt 214.8 lb

## 2022-08-04 DIAGNOSIS — G4733 Obstructive sleep apnea (adult) (pediatric): Secondary | ICD-10-CM | POA: Diagnosis not present

## 2022-08-04 NOTE — Patient Instructions (Addendum)
Keep wokring on your weight. As discussed, we can get back together in 6 months and see if you want to do anything more for your mild sleep apnea.

## 2022-08-08 ENCOUNTER — Ambulatory Visit (INDEPENDENT_AMBULATORY_CARE_PROVIDER_SITE_OTHER): Payer: Medicare HMO | Admitting: Family Medicine

## 2022-08-08 ENCOUNTER — Encounter (INDEPENDENT_AMBULATORY_CARE_PROVIDER_SITE_OTHER): Payer: Self-pay | Admitting: Family Medicine

## 2022-08-08 ENCOUNTER — Ambulatory Visit (INDEPENDENT_AMBULATORY_CARE_PROVIDER_SITE_OTHER): Payer: Medicare HMO | Admitting: Psychology

## 2022-08-08 VITALS — BP 125/78 | HR 84 | Temp 98.3°F | Ht 63.5 in | Wt 208.0 lb

## 2022-08-08 DIAGNOSIS — R7303 Prediabetes: Secondary | ICD-10-CM | POA: Diagnosis not present

## 2022-08-08 DIAGNOSIS — E559 Vitamin D deficiency, unspecified: Secondary | ICD-10-CM | POA: Diagnosis not present

## 2022-08-08 DIAGNOSIS — E538 Deficiency of other specified B group vitamins: Secondary | ICD-10-CM | POA: Diagnosis not present

## 2022-08-08 DIAGNOSIS — F4322 Adjustment disorder with anxiety: Secondary | ICD-10-CM

## 2022-08-08 DIAGNOSIS — R0602 Shortness of breath: Secondary | ICD-10-CM

## 2022-08-08 DIAGNOSIS — Z6836 Body mass index (BMI) 36.0-36.9, adult: Secondary | ICD-10-CM

## 2022-08-08 DIAGNOSIS — I10 Essential (primary) hypertension: Secondary | ICD-10-CM

## 2022-08-08 NOTE — Progress Notes (Signed)
Cheyenne Mcdonald, D.O.  ABFM, ABOM Specializing in Clinical Bariatric Medicine  Office located at: [address] [place], [place]  [address]     Assessment and Plan:  Recheck labs next OV.  Orders Placed This Encounter  Procedures   Comprehensive metabolic panel   VITAMIN D 25 Hydroxy (Vit-D Deficiency, Fractures)   Hemoglobin A1c   Vitamin B12   Insulin, random   CBC with Differential/Platelet    Prediabetes Assessment: Condition is Not optimized. This condition diet/exercise controlled. She notes that her hunger and cravings are mainly controlled. She endorses not having cravings as long as it is not "in the house for her to think about".  Lab Results  Component Value Date   HGBA1C 6.1 (H) 04/28/2022   HGBA1C 6.0 (H) 12/08/2021   HGBA1C 5.9 (H) 07/27/2021   INSULIN 17.6 12/08/2021   INSULIN 16.9 07/27/2021    Plan: - Cheyenne Mcdonald will continue to work on weight loss, exercise, via their meal plan we devised to help decrease the risk of progressing to diabetes.   - We will recheck A1c and fasting insulin level at her next OV.   - Stressed importance of dietary and lifestyle modifications to result in weight loss as first line txmnt  - Continue to decrease simple carbs/ sugars; increase fiber and proteins -> follow her meal plan.    - Anticipatory guidance given.    - Cheyenne Mcdonald will continue to work on weight loss, exercise, via their meal plan we devised to help decrease the risk of progressing to diabetes. We will recheck A1c and fasting insulin level in approximately 3 months from last check, or as deemed appropriate.    SOB (shortness of breath) on exertion Assessment: Condition is Improving, but not optimized. Her REE a year ago was 1296 and improved to 1397 Pt walks with 3lbs weights when walking. Her VO2 is 203, and calculated BMR 1692.   Plan: - I reviewed these findings with the pt and what her implications are for the meal plan.   - We also discussed various ways  we can improved her REE as well.   - I advised that she lift weights separately as walking with weights may cause a shoulder injury. I advised that she add a additional 60 minutes or weight training twice a week.  - We discussed and demonstrated various weightlifting exercises.   - There will be no change in her meal plan, calories and protein intake.    Essential hypertension- Diet Controlled Assessment: Condition is Controlled. This is diet/exercise controlled and she is currently not taking any antihypertensive medication. She notes her BP being under control.  Last 3 blood pressure readings in our office are as follows: BP Readings from Last 3 Encounters:  08/08/22 125/78  08/04/22 118/76  07/18/22 125/79   Plan: - BP is 125/78 at goal today.   - Lifestyle changes such as following our low salt, heart healthy meal plan and engaging in a regular exercise program discussed and to avoid buying foods that are: processed, frozen, or prepackaged to avoid excess salt.  - Ambulatory blood pressure monitoring encouraged.    - We will continue to monitor possible symptoms closely alongside PCP/ specialists.    Vitamin D deficiency Assessment: Condition is At goal. Her vitamin D level is within optimal range. This is well maintained with OTC vitamin D supplements.  Lab Results  Component Value Date   VD25OH 62.7 04/28/2022   VD25OH 67.3 12/08/2021   VD25OH  55.4 07/27/2021   Plan: - Continue with Cholecalciferol 5,000lU daily.   - weight loss will likely improve availability of vitamin D, thus encouraged Signa to continue with meal plan and their weight loss efforts to further improve this condition.  Thus, we will need to monitor levels regularly (every 3-4 mo on average) to keep levels within normal limits and prevent over supplementation.   B12 nutritional deficiency Assessment: Condition is At goal. This is well maintained by OTC vitamin B12 supplement. She is tolerating this well  without significant difficulty and denies any adverse side effects.  Lab Results  Component Value Date   VITAMINB12 860 12/08/2021   Plan: - Continue OTC Vitamin B12 300-500 mcg daily.   - Continue to adhere to our prudent prescribed meal plan and focus on eating vitamin B rich foods such as  lean red meats; poultry; eggs; seafood; beans, peas, and lentils; nuts and seeds; and soy products.    TREATMENT PLAN FOR OBESITY: BMI 36.0-36.9,adult-current BMI 36.09 Morbid obesity (HCC)-start bmi 38.97/date 07/27/21 Assessment:  Cheyenne Mcdonald is here to discuss her progress with her obesity treatment plan along with follow-up of her obesity related diagnoses. See Medical Weight Management Flowsheet for complete bioelectrical impedance results.  Condition is not optimized. Biometric data collected today, was reviewed with patient.   Since last office visit on 07/18/2022 patient's  Muscle mass has decreased by 5lb. Fat mass has increased by 6.2lb. Total body water has decreased by 4.8lb.  Counseling done on how various foods will affect these numbers and how to maximize success  Total lbs lost to date: 17 Total weight loss percentage to date: 7.56%   Plan: - Continue to follow her Category 1 Plan and journaling following the recommended goals of [phone] calories and 80+ protein more consistently.    Behavioral Intervention Additional resources provided today:  food journaling log Evidence-based interventions for health behavior change were utilized today including the discussion of self monitoring techniques, problem-solving barriers and SMART goal setting techniques.   Regarding patient's less desirable eating habits and patterns, we employed the technique of small changes.  Pt will specifically work on: Document her calorie and protein intake consistently and bring it in next OV for next visit.    Recommended Physical Activity Goals  Cheyenne Mcdonald has been advised to slowly work up to 150 minutes  of moderate intensity aerobic activity a week and strengthening exercises 2-3 times per week for cardiovascular health, weight loss maintenance and preservation of muscle mass.   She has agreed to Continue current level of physical activity   FOLLOW UP: Return in about 3 weeks (around 08/29/2022). She was informed of the importance of frequent follow up visits to maximize her success with intensive lifestyle modifications for her multiple health conditions. Cheyenne Mcdonald is aware that we will review all of her lab results at our next visit together in person.  She is aware that if anything is critical/ life threatening with the results, we will be contacting her via MyChart or by my CMA will be calling them prior to the office visit to discuss acute management.   Subjective:   Chief complaint: Obesity Cheyenne Mcdonald is here to discuss her progress with her obesity treatment plan. She is on the the Category 1 Plan and keeping a food journal and adhering to recommended goals of [phone] calories and 80+ protein and states she is following her eating plan approximately 50% of the time. She states she is walking 60 minutes 2  days per week.  Interval History:  Cheyenne Mcdonald is here for a follow up office visit.     Since last OV she has been well. The week of July 8th she was doing online classes which threw her off schedule. She endorses not exercising or walking as much. She did write down her daily goals such as 10,000 steps per day, 1000 calories per day, 100g+ of protein per day and to drink 100oz of water per day. Pt endorses "knowing all of the things she needs to do but needs hep executing it". She does note getting her 100g of protein. She has started skipping cereal during breakfast and replaces it with protein and lean meats.    Review of Systems:  Pertinent positives were addressed with patient today.  Reviewed by clinician on day of visit: allergies, medications, problem list, medical history,  surgical history, family history, social history, and previous encounter notes.  Weight Summary and Biometrics   Weight Lost Since Last Visit: 0lvb  Weight Gained Since Last Visit: 1lb    Vitals Temp: 98.3 F (36.8 C) BP: 125/78 Pulse Rate: 84 SpO2: 100 %   Anthropometric Measurements Height: 5' 3.5" (1.613 m) Weight: 208 lb (94.3 kg) BMI (Calculated): 36.26 Weight at Last Visit: 207lb Weight Lost Since Last Visit: 0lvb Weight Gained Since Last Visit: 1lb Starting Weight: 225lb Total Weight Loss (lbs): 17 lb (7.711 kg) Peak Weight: 240lb   Body Composition  Body Fat %: 40.4 % Fat Mass (lbs): 84 lbs Muscle Mass (lbs): 117.8 lbs Total Body Water (lbs): 80.6 lbs Visceral Fat Rating : 13   Other Clinical Data RMR: 1397 Fasting: yes Labs: yes Today's Visit #: 18 Starting Date: 07/27/21   Objective:   PHYSICAL EXAM: Blood pressure 125/78, pulse 84, temperature 98.3 F (36.8 C), height 5' 3.5" (1.613 m), weight 208 lb (94.3 kg), SpO2 100%. Body mass index is 36.27 kg/m.  General: Well Developed, well nourished, and in no acute distress.  HEENT: Normocephalic, atraumatic Skin: Warm and dry, cap RF less 2 sec, good turgor Chest:  Normal excursion, shape, no gross abn Respiratory: speaking in full sentences, no conversational dyspnea NeuroM-Sk: Ambulates w/o assistance, moves * 4 Psych: A and O *3, insight good, mood-full  DIAGNOSTIC DATA REVIEWED:  BMET    Component Value Date/Time   NA 141 04/28/2022 0913   K 4.1 04/28/2022 0913   CL 106 04/28/2022 0913   CO2 21 04/28/2022 0913   GLUCOSE 94 04/28/2022 0913   GLUCOSE 96 03/03/2022 1026   BUN 29 (H) 04/28/2022 0913   CREATININE 1.16 (H) 04/28/2022 0913   CREATININE 1.17 (H) 08/31/2021 1427   CALCIUM 9.9 04/28/2022 0913   GFRNONAA 47 (L) 03/03/2022 1026   GFRNONAA 50 (L) 08/31/2021 1427   GFRAA 58 (L) 10/09/2019 0806   Lab Results  Component Value Date   HGBA1C 6.1 (H) 04/28/2022   HGBA1C 5.9  (H) 07/27/2021   Lab Results  Component Value Date   INSULIN 17.6 12/08/2021   INSULIN 16.9 07/27/2021   Lab Results  Component Value Date   TSH 2.200 07/27/2021   CBC    Component Value Date/Time   WBC 4.4 03/03/2022 1026   RBC 4.20 03/03/2022 1026   HGB 13.4 03/03/2022 1026   HGB 13.2 08/31/2021 1427   HGB 12.9 07/27/2021 1021   HCT 38.7 03/03/2022 1026   HCT 37.3 07/27/2021 1021   PLT 159 03/03/2022 1026   PLT 150 08/31/2021 1427   PLT  139 (L) 07/27/2021 1021   MCV 92.1 03/03/2022 1026   MCV 93 07/27/2021 1021   MCH 31.9 03/03/2022 1026   MCHC 34.6 03/03/2022 1026   RDW 12.5 03/03/2022 1026   RDW 12.5 07/27/2021 1021   Iron Studies No results found for: "IRON", "TIBC", "FERRITIN", "IRONPCTSAT" Lipid Panel     Component Value Date/Time   CHOL 135 12/08/2021 0906   TRIG 62 12/08/2021 0906   HDL 49 12/08/2021 0906   LDLCALC 73 12/08/2021 0906   Hepatic Function Panel     Component Value Date/Time   PROT 6.9 03/03/2022 1026   PROT 6.3 12/08/2021 0906   ALBUMIN 4.1 03/03/2022 1026   ALBUMIN 4.1 12/08/2021 0906   AST 16 03/03/2022 1026   AST 15 08/31/2021 1427   ALT 13 03/03/2022 1026   ALT 14 08/31/2021 1427   ALKPHOS 50 03/03/2022 1026   BILITOT 0.4 03/03/2022 1026   BILITOT 0.2 12/08/2021 0906   BILITOT 0.4 08/31/2021 1427      Component Value Date/Time   TSH 2.200 07/27/2021 1021   Nutritional Lab Results  Component Value Date   VD25OH 62.7 04/28/2022   VD25OH 67.3 12/08/2021   VD25OH 55.4 07/27/2021    Attestations:   This encounter took 50 total minutes of time including any pre-visit and post-visit time spent on this date of service, including taking a thorough history, reviewing any labs and/or imaging, reviewing prior notes, as well as documenting in the electronic health record on the date of service. Over 50% of that time was in direct face-to-face counseling and coordinating care for the patient today  I, Clinical biochemist, acting as a  Stage manager for [employer], DO., have compiled all relevant documentation for today's office visit on behalf of Thomasene Lot, DO, while in the presence of [employer], DO.  I have reviewed the above documentation for accuracy and completeness, and I agree with the above. Cheyenne Mcdonald, D.O.  The 21st Century Cures Act was signed into law in 2016 which includes the topic of electronic health records.  This provides immediate access to information in MyChart.  This includes consultation notes, operative notes, office notes, lab results and pathology reports.  If you have any questions about what you read please let us know at your next visit so we can discuss your concerns and take corrective action if need be.  We are right here with you.

## 2022-08-08 NOTE — Progress Notes (Signed)
[facility] Counselor/Therapist Progress Note  Patient ID: Cheyenne Mcdonald, MRN: [id number],    Date: 08/08/2022  Time Spent: 3:00pm-3:55pm    55 minutes   Treatment Type: Individual Therapy  Reported Symptoms: worrying.  Mental Status Exam: Appearance:  Casual     Behavior: Appropriate  Motor: Normal  Speech/Language:  Normal Rate  Affect: Appropriate  Mood: normal  Thought process: normal  Thought content:   WNL  Sensory/Perceptual disturbances:   WNL  Orientation: oriented to person, place, time/date, and situation  Attention: Good  Concentration: Good  Memory: WNL  Fund of knowledge:  Good  Insight:   Good  Judgment:  Good  Impulse Control: Good   Risk Assessment: Danger to Self:  No Self-injurious Behavior: No Danger to Others: No Duty to Warn:no Physical Aggression / Violence:No  Access to Firearms a concern: No  Gang Involvement:No   Subjective: Pt present for face-to-face individual therapy via video.  Pt consents to telehealth video session and is aware of limitations of virtual sessions. Location of pt: home Location of therapist: home office.  Pt talked about being busy.  She completed a [place] class online.  She got a B in the class and feels she learned a lot.   Pt talked about her weight loss goals.  She is still struggling with losing weight.   Pt is on a 1,000 calorie diet.   She plans to walk daily.  She does not follow through every day with the diet.   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   Addressed the issues and helped pt process her feelings.   Pt had a talk with Cheyenne Mcdonald about her plans to transition as a trans female.  Cheyenne Mcdonald is only going to take a hormone gel that she can discontinue at any time.  Pt is relieved that Cheyenne Mcdonald will not be doing anything irreversable.  Pt talked about her relationship with her husband.  He continues to not want to go anywhere or do anything but pt is doing  things on her own.   She is planning her trip to [place].  Pt has 5 trips planned this next year including her trip to [place].  Pt is looking forward to the trips but wishes her husband would participate.  They will be married 51 years in August.   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 Pt talked about her relationship with her mother who is 21 years old.  Her mother wants pt to spend more time with her and wants pt to take over her finances.  Pt states her mother is very independent.   Worked on self care strategies.   Provided supportive therapy.     Interventions: Cognitive Behavioral Therapy and Insight-Oriented  Diagnosis: F43.22  Plan of Care: Recommend ongoing therapy.  Pt participated in setting treatment goals.  Pt states her goals are to have a safe place to talk and to work on acceptance and to improve coping skills.   Plan to meet every two weeks.   Treatment Plan  (target date: 03/01/2023) Client Abilities/Strengths  Pt is bright, engaging and motivated for therapy.  Client Treatment Preferences  Individual therapy.  Client Statement of Needs  Improve coping skills.  Symptoms  Excessive and/or unrealistic worry that is difficult to control occurring more days than not for at least 6 months about a number of events or activities.  Problems Addressed  Anxiety  Goals 1. Enhance ability to effectively cope with the full variety of life's worries and anxieties. 2. Learn and implement coping skills that result in a reduction of anxiety and worry, and improved daily functioning. Objective Learn to accept limitations in life and commit to tolerating, rather than avoiding, unpleasant emotions while accomplishing meaningful goals. Target Date: 2023-03-01 Frequency: Biweekly Progress: 10 Modality: individual Related Interventions 1. Use techniques from Acceptance and Commitment Therapy to help client accept uncomfortable realities such as lack  of complete control, imperfections, and uncertainty and tolerate unpleasant emotions and thoughts in order to accomplish value-consistent goals. Objective Learn and implement problem-solving strategies for realistically addressing worries. Target Date: 2023-03-01 Frequency: Biweekly Progress: 10 Modality: individual Related Interventions 1. Assign the client a homework exercise in which he/she problem-solves a current problem.  review, reinforce success, and provide corrective feedback toward improvement. 2. Teach the client problem-solving strategies involving specifically defining a problem, generating options for addressing it, evaluating the pros and cons of each option, selecting and implementing an optional action, and reevaluating and refining the action. Objective Learn and implement calming skills to reduce overall anxiety and manage anxiety symptoms. Target Date: 2023-03-01 Frequency: Biweekly Progress: 10 Modality: individual Related Interventions 1. Assign the client to read about progressive muscle relaxation and other calming strategies in relevant books or treatment manuals (e.g., Progressive Relaxation Training by Robb Matar and Alen Blew; Mastery of Your Anxiety and Worry: Workbook by Earlie Counts). 2. Assign the client homework each session in which he/she practices relaxation exercises daily, gradually applying them progressively from non-anxiety-provoking to anxiety-provoking situations; review and reinforce success while providing corrective feedback toward improvement. 3. Teach the client calming/relaxation skills (e.g., applied relaxation, progressive muscle relaxation, cue controlled relaxation; mindful breathing; biofeedback) and how to discriminate better between relaxation and tension; teach the client how to apply these skills to his/her daily life. 3. Reduce overall frequency, intensity, and duration of the anxiety so that daily functioning is not impaired. 4. Resolve  the core conflict that is the source of anxiety. 5. Stabilize anxiety level while increasing ability to function on a daily basis. Diagnosis :    F43.22 Conditions For Discharge Achievement of treatment goals and objectives.  Samanthamarie Ezzell, LCSW

## 2022-08-26 ENCOUNTER — Encounter: Payer: Self-pay | Admitting: Internal Medicine

## 2022-08-26 NOTE — Assessment & Plan Note (Signed)
Very mild OSA. She chooses to work on weight and sleep off back for now. We will reassess on return in 6 months.

## 2022-08-26 NOTE — Assessment & Plan Note (Signed)
BMI today 37. She is prepared to work on her weight.

## 2022-08-29 ENCOUNTER — Ambulatory Visit (INDEPENDENT_AMBULATORY_CARE_PROVIDER_SITE_OTHER): Payer: Medicare HMO | Admitting: Family Medicine

## 2022-08-29 ENCOUNTER — Encounter (INDEPENDENT_AMBULATORY_CARE_PROVIDER_SITE_OTHER): Payer: Self-pay | Admitting: Family Medicine

## 2022-08-29 VITALS — BP 124/78 | HR 77 | Temp 97.8°F | Ht 63.5 in | Wt 204.0 lb

## 2022-08-29 DIAGNOSIS — E538 Deficiency of other specified B group vitamins: Secondary | ICD-10-CM

## 2022-08-29 DIAGNOSIS — Z6836 Body mass index (BMI) 36.0-36.9, adult: Secondary | ICD-10-CM

## 2022-08-29 DIAGNOSIS — E559 Vitamin D deficiency, unspecified: Secondary | ICD-10-CM

## 2022-08-29 DIAGNOSIS — N1831 Chronic kidney disease, stage 3a: Secondary | ICD-10-CM | POA: Diagnosis not present

## 2022-08-29 DIAGNOSIS — R7303 Prediabetes: Secondary | ICD-10-CM | POA: Diagnosis not present

## 2022-08-29 DIAGNOSIS — Z6835 Body mass index (BMI) 35.0-35.9, adult: Secondary | ICD-10-CM

## 2022-08-29 NOTE — Progress Notes (Signed)
Cheyenne Mcdonald, D.O.  ABFM, ABOM Specializing in Clinical Bariatric Medicine  Office located at: [address] [place], [place]  [address]     Assessment and Plan:    Vitamin D deficiency Assessment: Condition is Controlled.. She has been tolerant and complaint with taking OTC vitamin D3 supplements. Her levels are well controlled with oral OTC Cholecalciferol 5,000lU daily.  Lab Results  Component Value Date   VD25OH 66.8 08/08/2022   VD25OH 62.7 04/28/2022   VD25OH 67.3 12/08/2021   Plan: - Continue Cholecalciferol daily at its current dose. She denies need for refill.   - We will continue to monitor pt levels regularly every 3-4 mo on average to keep levels within normal limits and prevent over supplementation. Will adjust treatment as deemed clinically necessary.    Prediabetes Assessment: Condition is Not at goal. Her A1c improved from 6.1 on 04/28/2022 to 5.7 on 08/08/2022. This is diet/exercise controlled. She endorses that her hunger and cravings are well controlled when completely following the prescribed nutritional meal plan. She has decreased her carb intake.  Lab Results  Component Value Date   HGBA1C 5.7 (H) 08/08/2022   HGBA1C 6.1 (H) 04/28/2022   HGBA1C 6.0 (H) 12/08/2021   INSULIN 18.7 08/08/2022   INSULIN 17.6 12/08/2021   INSULIN 16.9 07/27/2021   Lab Results  Component Value Date   WBC 4.1 08/08/2022   HGB 12.9 08/08/2022   HCT 39.0 08/08/2022   MCV 94 08/08/2022   PLT 151 08/08/2022   Plan: -  Continue to follow the prescribed nutritional plan and exercise as advised.   - Anticipatory guidance given.    - We will recheck A1c and fasting insulin level in approximately 3 months from last check, or as deemed appropriate.    Stage 3a chronic kidney disease (HCC) Assessment: Pt see's an nephrologist once a year. Her nephrologist found no known cause to her elevated level and states that this is due to age. She was advised to increase her  water intake and exercise.  Lab Results  Component Value Date   CREATININE 1.12 (H) 08/08/2022   BUN 20 08/08/2022   NA 141 08/08/2022   K 4.1 08/08/2022   CL 104 08/08/2022   CO2 23 08/08/2022      Component Value Date/Time   PROT 6.5 08/08/2022 0955   ALBUMIN 4.2 08/08/2022 0955   AST 17 08/08/2022 0955   AST 15 08/31/2021 1427   ALT 16 08/08/2022 0955   ALT 14 08/31/2021 1427   ALKPHOS 51 08/08/2022 0955   BILITOT 0.2 08/08/2022 0955   BILITOT 0.4 08/31/2021 1427    Plan:- Continue to increase water intake.   - Unless pre-existing renal or cardiopulmonary conditions exist which patient was told to limit their fluid intake by another provider, I recommended roughly one half of their weight in pounds, to be the approximate ounces of non-caloric, non-caffeinated beverages they should drink per day; including more if they are engaging in exercise.  - Continue to regularly meet with her nephrologist.    B12 nutritional deficiency Assessment: Condition is Controlled.Marland Kitchen  Her B12 levels are well controlled. She has been compliant and tolerant with taking OTC b12 supplements. Her B12 level went from 860 to 839.  Lab Results  Component Value Date   VITAMINB12 839 08/08/2022   Lab Results  Component Value Date    VITAMINB12 860 12/08/2021   Plan: - Continue to take OTC Cyanocobalamin at current dose and continue to eat foods  rich in vitamin B.    TREATMENT PLAN FOR OBESITY: BMI 36.0-36.9,adult-current BMI 35.57 Morbid obesity (HCC)-start bmi 38.97/date 07/27/21 Assessment:  Cheyenne Mcdonald is here to discuss her progress with her obesity treatment plan along with follow-up of her obesity related diagnoses. See Medical Weight Management Flowsheet for complete bioelectrical impedance results.  Condition is docourse: improving but not optimized. Biometric data collected today, was reviewed with patient.   Since last office visit on 08/08/2022 patient's  Muscle mass has increased  by 1.6lb. Fat mass has decreased by 5.4lb. Total body water has increased by 2.8lb.  Counseling done on how various foods will affect these numbers and how to maximize success.   Total lbs lost to date: 21lbs Total weight loss percentage to date: 9.33%    Plan:   - Continue to adhere to the prescribed Category 1 meal plan and journaling following the recommended goals of [id number] calories and 80+ protein.   Behavioral Intervention Additional resources provided today: Food journaling plan information and Band exercises handout Evidence-based interventions for health behavior change were utilized today including the discussion of self monitoring techniques, problem-solving barriers and SMART goal setting techniques.   Regarding patient's less desirable eating habits and patterns, we employed the technique of small changes.  Pt will specifically work on: Continue to journal her intake and increase her water intake for next visit.     She has agreed to Continue current level of physical activity    FOLLOW UP: Return in about 1 month (around 09/29/2022).  She was informed of the importance of frequent follow up visits to maximize her success with intensive lifestyle modifications for her multiple health conditions.  Subjective:   Chief complaint: Obesity Cheyenne Mcdonald is here to discuss her progress with her obesity treatment plan. She is on the the Category 1 Plan and keeping a food journal and adhering to recommended goals of [id number] calories and 80+ protein and states she is following her eating plan approximately 75% of the time. She states she is walking 75 minutes 5 days per week.  Interval History:  Cheyenne Mcdonald is here for a follow up office visit.     Since last OV she has been doing well. She focused on journaling more consistently. She has been more aware with her food intake and has decided to stop eating sharp cheddar cheese as it takes up too much calories. She does note that she  struggles with hitting her calorie goals every day and doesn't keep track of her water intake. She averages 1000-1200 calories daily and 80+g of protein almost every day.    Review of Systems:  Pertinent positives were addressed with patient today.  Reviewed by clinician on day of visit: allergies, medications, problem list, medical history, surgical history, family history, social history, and previous encounter notes.  Weight Summary and Biometrics   Weight Lost Since Last Visit: 4lb  Weight Gained Since Last Visit: 0lb   Vitals Temp: 97.8 F (36.6 C) BP: 124/78 Pulse Rate: 77 SpO2: 94 %   Anthropometric Measurements Height: 5' 3.5" (1.613 m) Weight: 204 lb (92.5 kg) BMI (Calculated): 35.57 Weight at Last Visit: 208lb Weight Lost Since Last Visit: 4lb Weight Gained Since Last Visit: 0lb Starting Weight: 225lb Total Weight Loss (lbs): 21 lb (9.526 kg) Peak Weight: 240lb   Body Composition  Body Fat %: 38.5 % Fat Mass (lbs): 78.6 lbs Muscle Mass (lbs): 119.4 lbs Total Body Water (lbs): 83.4 lbs Visceral Fat Rating : 13  Other Clinical Data Fasting: no Labs: no Today's Visit #: 19 Starting Date: 07/27/21   Objective:   PHYSICAL EXAM: Blood pressure 124/78, pulse 77, temperature 97.8 F (36.6 C), height 5' 3.5" (1.613 m), weight 204 lb (92.5 kg), SpO2 94%. Body mass index is 35.57 kg/m.  General: Well Developed, well nourished, and in no acute distress.  HEENT: Normocephalic, atraumatic Skin: Warm and dry, cap RF less 2 sec, good turgor Chest:  Normal excursion, shape, no gross abn Respiratory: speaking in full sentences, no conversational dyspnea NeuroM-Sk: Ambulates w/o assistance, moves * 4 Psych: A and O *3, insight good, mood-full  DIAGNOSTIC DATA REVIEWED:  BMET    Component Value Date/Time   NA 141 08/08/2022 0955   K 4.1 08/08/2022 0955   CL 104 08/08/2022 0955   CO2 23 08/08/2022 0955   GLUCOSE 92 08/08/2022 0955   GLUCOSE 96  03/03/2022 1026   BUN 20 08/08/2022 0955   CREATININE 1.12 (H) 08/08/2022 0955   CREATININE 1.17 (H) 08/31/2021 1427   CALCIUM 9.3 08/08/2022 0955   GFRNONAA 47 (L) 03/03/2022 1026   GFRNONAA 50 (L) 08/31/2021 1427   GFRAA 58 (L) 10/09/2019 0806   Lab Results  Component Value Date   HGBA1C 5.7 (H) 08/08/2022   HGBA1C 5.9 (H) 07/27/2021   Lab Results  Component Value Date   INSULIN 18.7 08/08/2022   INSULIN 16.9 07/27/2021   Lab Results  Component Value Date   TSH 2.200 07/27/2021   CBC    Component Value Date/Time   WBC 4.1 08/08/2022 0955   WBC 4.4 03/03/2022 1026   RBC 4.15 08/08/2022 0955   RBC 4.20 03/03/2022 1026   HGB 12.9 08/08/2022 0955   HCT 39.0 08/08/2022 0955   PLT 151 08/08/2022 0955   MCV 94 08/08/2022 0955   MCH 31.1 08/08/2022 0955   MCH 31.9 03/03/2022 1026   MCHC 33.1 08/08/2022 0955   MCHC 34.6 03/03/2022 1026   RDW 12.7 08/08/2022 0955   Iron Studies No results found for: "IRON", "TIBC", "FERRITIN", "IRONPCTSAT" Lipid Panel     Component Value Date/Time   CHOL 135 12/08/2021 0906   TRIG 62 12/08/2021 0906   HDL 49 12/08/2021 0906   LDLCALC 73 12/08/2021 0906   Hepatic Function Panel     Component Value Date/Time   PROT 6.5 08/08/2022 0955   ALBUMIN 4.2 08/08/2022 0955   AST 17 08/08/2022 0955   AST 15 08/31/2021 1427   ALT 16 08/08/2022 0955   ALT 14 08/31/2021 1427   ALKPHOS 51 08/08/2022 0955   BILITOT 0.2 08/08/2022 0955   BILITOT 0.4 08/31/2021 1427      Component Value Date/Time   TSH 2.200 07/27/2021 1021   Nutritional Lab Results  Component Value Date   VD25OH 66.8 08/08/2022   VD25OH 62.7 04/28/2022   VD25OH 67.3 12/08/2021    Attestations:   I, Clinical biochemist, acting as a Stage manager for [employer], DO., have compiled all relevant documentation for today's office visit on behalf of Thomasene Lot, DO, while in the presence of [employer], DO.  I have reviewed the above documentation for accuracy  and completeness, and I agree with the above. Cheyenne Mcdonald, D.O.  The 21st Century Cures Act was signed into law in 2016 which includes the topic of electronic health records.  This provides immediate access to information in MyChart.  This includes consultation notes, operative notes, office notes, lab results and pathology reports.  If you have any  questions about what you read please let us know at your next visit so we can discuss your concerns and take corrective action if need be.  We are right here with you.

## 2022-09-01 ENCOUNTER — Inpatient Hospital Stay: Payer: Medicare HMO | Attending: Hematology and Oncology | Admitting: Hematology and Oncology

## 2022-09-01 VITALS — BP 115/65 | HR 79 | Temp 97.2°F | Resp 18 | Ht 63.5 in | Wt 205.9 lb

## 2022-09-01 DIAGNOSIS — D0511 Intraductal carcinoma in situ of right breast: Secondary | ICD-10-CM | POA: Insufficient documentation

## 2022-09-01 DIAGNOSIS — Z171 Estrogen receptor negative status [ER-]: Secondary | ICD-10-CM | POA: Insufficient documentation

## 2022-09-01 DIAGNOSIS — Z79810 Long term (current) use of selective estrogen receptor modulators (SERMs): Secondary | ICD-10-CM | POA: Diagnosis not present

## 2022-09-01 DIAGNOSIS — C50412 Malignant neoplasm of upper-outer quadrant of left female breast: Secondary | ICD-10-CM | POA: Insufficient documentation

## 2022-09-01 DIAGNOSIS — Z923 Personal history of irradiation: Secondary | ICD-10-CM | POA: Insufficient documentation

## 2022-09-01 DIAGNOSIS — Z9221 Personal history of antineoplastic chemotherapy: Secondary | ICD-10-CM | POA: Insufficient documentation

## 2022-09-01 NOTE — Progress Notes (Signed)
[facility]  [phone] [phone]) [phone]    ID: Cheyenne Mcdonald DOB: 04-16-1950  MR#: [id number]  GEX#:[id number]  Patient Care Team: Cheyenne Salt, PA as PCP - General (Physician Assistant) Cheyenne Better, MD as Consulting Physician (Obstetrics and Gynecology) Cheyenne Proud, RN as Oncology Nurse Navigator Cheyenne Angelica, RN as Oncology Nurse Navigator Cheyenne Miyamoto, MD as Consulting Physician (General Surgery) Cheyenne Blackbird, MD as Consulting Physician (Radiation Oncology) Cheyenne Sober, MD as Consulting Physician (Nephrology) Cheyenne Moulds, MD as Consulting Physician (Hematology and Oncology) Cheyenne Moulds, MD   CHIEF COMPLAINT: 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   She is doing amazingly well.  She says she doesn't feel like she never had cancer. She is working with weight loss clinic, lost 15 lbs in 1 yr. She is learning [place] and is hoping to live in [place] next fall for about 2 months. She denies any complaints at all.  Rest of the pertinent 10 point ROS reviewed and neg   COVID 19 VACCINATION STATUS: Status post Pfizer x2 with booster October 2021; also had COVID July 2022   HISTORY OF CURRENT ILLNESS: From the original intake note:  Cheyenne Mcdonald presented for her routine mammography with a palpable left breast lump. She underwent bilateral diagnostic mammography with tomography and left breast ultrasonography at [facility] on 09/13/2019 showing: breast density category B; two adjacent masses in left breast at 1 o'clock, spanning approximately 3.3 cm; one indeterminate left axillary lymph node with 0.4 cm cortical bulge; no evidence right breast malignancy.  Accordingly on 09/27/2019 she proceeded to biopsy of the left breast areas in  question. The pathology from this procedure ([id number]) showed: invasive ductal carcinoma, grade 3, present in both of the adjacent masses. Prognostic indicators significant for: estrogen receptor, 0% negative and progesterone receptor, 0% negative. Proliferation marker Ki67 at 90%. HER2 equivocal by immunohistochemistry (2+), but negative by fluorescent in situ hybridization with a signals ratio 2.2 and number per cell 3.3. Additional analysis of more cells showed a signals ratio 2.05 and number per cell 3.13.  The biopsied lymph node was negative for carcinoma.  This was felt to be concordant  The patient's subsequent history is as detailed below.   PAST MEDICAL HISTORY: Past Medical History:  Diagnosis Date   Anemia    Arthritis    gout big toe   Breast cancer (HCC)    left breast IDC, right breast DCIS   History of radiation therapy 07/20/2020   bilateral breasts 06/01/2020-07/20/2020  Dr Cheyenne Mcdonald   Hypertension    Kidney problem    Obesity    Personal history of chemotherapy    2022 bilat lumpectomies w/rad w/chemo   Personal history of radiation therapy    2022 bilat lumpectomies w/rad w/chemo   Prediabetes    Sleep apnea    does not use CPAP   Vitamin D deficiency   heart murmur (since childhood)   PAST SURGICAL HISTORY: Past Surgical History:  Procedure Laterality Date   ABDOMINAL HYSTERECTOMY     BREAST LUMPECTOMY Bilateral    2022 bilat lumpectomies w/rad w/chemo   BREAST LUMPECTOMY WITH RADIOACTIVE SEED AND SENTINEL LYMPH NODE BIOPSY Left 04/16/2020   Procedure: LEFT BREAST LUMPECTOMY WITH RADIOACTIVE SEED AND LEFT AXILLARY SENTINEL LYMPH NODE BIOPSY;  Surgeon: Cheyenne Miyamoto, MD;  Location: [facility];  Service: General;  Laterality: Left;   BREAST LUMPECTOMY WITH RADIOACTIVE SEED LOCALIZATION Right 04/16/2020   Procedure: RIGHT BREAST LUMPECTOMY WITH RADIOACTIVE SEED LOCALIZATION;  Surgeon: Cheyenne Miyamoto, MD;  Location: [facility];  Service: General;  Laterality: Right;   IR IMAGING GUIDED PORT INSERTION  10/18/2019   PORT-A-CATH REMOVAL Right 09/02/2020   Procedure: REMOVAL PORT-A-CATH;  Surgeon: Cheyenne Miyamoto, MD;  Location: [facility];  Service: General;  Laterality: Right;    FAMILY HISTORY: Family History  Problem Relation Age of Onset   High Cholesterol Mother    Breast cancer Cousin 71       maternal cousin; bilateral    Her father died at age 54, cause of death unknown. Her mother is age 76 as of 09/2019. Cheyenne Mcdonald has two brothers (and no sisters). She reports one cousin with breast cancer at age 36.   GYNECOLOGIC HISTORY:  No LMP recorded. Patient has had a hysterectomy. Menarche: 72 years old Age at first live birth: 72 years old GX P 2 LMP 1990 Contraceptive: used for maybe 20 years HRT: never used  Hysterectomy? Yes, 1990 BSO? 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 Husband Cheyenne Mcdonald is retired Hotel manager and then retired Therapist, occupational.  At home is just the 2 of them. Daughter Cheyenne Mcdonald, age 31, works in data entry in [employer]. Son Cheyenne Mcdonald, age 46, has a college degree but works for [employer] in [employer]. Cheyenne Mcdonald has four grandchildren. She attends [employer] [address]    ADVANCED DIRECTIVES: In place   HEALTH MAINTENANCE: Social History   Tobacco Use   Smoking status: Never   Smokeless tobacco: Never  Substance Use Topics   Alcohol use: Never   Drug use: Never     Colonoscopy: 2018 (Dr. Loreta Mcdonald)  PAP: approx. 2013  Bone density: 2016, "normal"   Allergies  Allergen Reactions   Shellfish Allergy Itching    Current Outpatient Medications  Medication Sig Dispense Refill   Cholecalciferol (VITAMIN D-3) 125 MCG (5000 UT) TABS 5,000 IU daily 30 tablet    cyanocobalamin 500 MCG TABS 300-581mcg qd     tamoxifen (NOLVADEX) 20 MG tablet Take 1 tablet (20 mg total) by mouth daily. 90 tablet 3   No current  facility-administered medications for this visit.    OBJECTIVE: African American woman who appears younger than stated age  There were no vitals filed for this visit.     There is no height or weight on file to calculate BMI.   Wt Readings from Last 3 Encounters:  08/29/22 204 lb (92.5 kg)  08/08/22 208 lb (94.3 kg)  08/04/22 214 lb 12.8 oz (97.4 kg)     ECOG FS:1 - Symptomatic but completely ambulatory  Physical Exam Constitutional:      General: She is not in acute distress.    Appearance: Normal appearance.  HENT:     Head: Normocephalic and atraumatic.  Chest:     Comments: Bilateral breast status post lumpectomies.  Postsurgical changes, otherwise no palpable masses or regional adenopathy. Musculoskeletal:     Cervical back: Normal range of motion and neck supple. No rigidity.  Skin:    General: Skin is warm and dry.  Neurological:     Mental Status: She is alert.       LAB RESULTS:  CMP     Component Value Date/Time   NA 141 08/08/2022 0955  K 4.1 08/08/2022 0955   CL 104 08/08/2022 0955   CO2 23 08/08/2022 0955   GLUCOSE 92 08/08/2022 0955   GLUCOSE 96 03/03/2022 1026   BUN 20 08/08/2022 0955   CREATININE 1.12 (H) 08/08/2022 0955   CREATININE 1.17 (H) 08/31/2021 1427   CALCIUM 9.3 08/08/2022 0955   PROT 6.5 08/08/2022 0955   ALBUMIN 4.2 08/08/2022 0955   AST 17 08/08/2022 0955   AST 15 08/31/2021 1427   ALT 16 08/08/2022 0955   ALT 14 08/31/2021 1427   ALKPHOS 51 08/08/2022 0955   BILITOT 0.2 08/08/2022 0955   BILITOT 0.4 08/31/2021 1427   GFRNONAA 47 (L) 03/03/2022 1026   GFRNONAA 50 (L) 08/31/2021 1427   GFRAA 58 (L) 10/09/2019 0806    No results found for: "TOTALPROTELP", "ALBUMINELP", "A1GS", "A2GS", "BETS", "BETA2SER", "GAMS", "MSPIKE", "SPEI"  Lab Results  Component Value Date   WBC 4.1 08/08/2022   NEUTROABS 2.4 08/08/2022   HGB 12.9 08/08/2022   HCT 39.0 08/08/2022   MCV 94 08/08/2022   PLT 151 08/08/2022    No results found  for: "LABCA2"  No components found for: "LOVFIE332"  No results for input(s): "INR" in the last 168 hours.  No results found for: "LABCA2"  No results found for: "RJJ884"  No results found for: "CAN125"  No results found for: "CAN153"  No results found for: "CA2729"  No components found for: "HGQUANT"  No results found for: "CEA1", "CEA" / No results found for: "CEA1", "CEA"   No results found for: "AFPTUMOR"  No results found for: "CHROMOGRNA"  No results found for: "KPAFRELGTCHN", "LAMBDASER", "KAPLAMBRATIO" (kappa/lambda light chains)  No results found for: "HGBA", "HGBA2QUANT", "HGBFQUANT", "HGBSQUAN" (Hemoglobinopathy evaluation)   No results found for: "LDH"  No results found for: "IRON", "TIBC", "IRONPCTSAT" (Iron and TIBC)  No results found for: "FERRITIN"  Urinalysis No results found for: "COLORURINE", "APPEARANCEUR", "LABSPEC", "PHURINE", "GLUCOSEU", "HGBUR", "BILIRUBINUR", "KETONESUR", "PROTEINUR", "UROBILINOGEN", "NITRITE", "LEUKOCYTESUR"   STUDIES: No results found.   ELIGIBLE FOR AVAILABLE RESEARCH PROTOCOL: AET  ASSESSMENT: 72 y.o. [place] woman status post left breast upper outer quadrant biopsy 09/27/2019 for a clinically T1-T2 N0, stage IA- IIA invasive ductal carcinoma, grade 3, triple negative, with an MIB-1 of 90%.  (1) genetics testing 10/30/2019 through the [facility] Common Hereditary Cancers Panel found no deleterious mutations in APC, ATM, AXIN2, BARD1, BMPR1A, BRCA1, BRCA2, BRIP1, CDH1, CDK4, CDKN2A (p14ARF), CDKN2A (p16INK4a), CHEK2, CTNNA1, DICER1, EPCAM (Deletion/duplication testing only), GREM1 (promoter region deletion/duplication testing only), KIT, MEN1, MLH1, MSH2, MSH3, MSH6, MUTYH, NBN, NF1, NHTL1, PALB2, PDGFRA, PMS2, POLD1, POLE, PTEN, RAD50, RAD51C, RAD51D, RNF43, SDHB, SDHC, SDHD, SMAD4, SMARCA4. STK11, TP53, TSC1, TSC2, and VHL.  The following genes were evaluated for sequence changes only: SDHA and HOXB13 c.251G>A variant  only.  LEFT BREAST: (2) neoadjuvant chemotherapy consisting of doxorubicin and cyclophosphamide in dose dense fashion x4 starting 10/22/2019, completed 12/03/2019,followed by weekly carboplatin and paclitaxel x12 started 12/17/2019, last dose 02/25/2020  (a) echo 10/17/2019 shows an ejection fraction in the 60-65% range  (b) final 2 doses of carboplatinum/paclitaxel omitted with grade 1 neuropathy  (3) status post left lumpectomy and sentinel lymph node sampling 04/16/2020 for a ypT0 ypN0, complete pathologic response  (a) a total of 3 left axillary lymph nodes were removed  RIGHT BREAST: (4) multicentric biopsy x2 on the right (contralateral) breast 02/27/2020 shows ductal carcinoma in situ, estrogen and progesterone receptor strongly positive  (5) right lumpectomy 04/16/2020 shows a residual 1.4 cm of ductal carcinoma  in situ, grade 1, with close but negative margins  (6) adjuvant radiation to both breasts 06/01/2020 through 07/20/2020 Site Technique Total Dose (Gy) Dose per Fx (Gy) Completed Fx Beam Energies  Breast, Left: Breast_Lt 3D 50.4/50.4 1.8 28/28 10X  Breast, Left: Breast_Lt_Bst 3D 10/10 2 5/5 6X, 10X  Breast, Right: Breast_Rt 3D 50.4/50.4 1.8 28/28 10X  Breast, Right: Breast_Rt_Bst 3D 12/12 2 6/6 6X, 10X   (7) tamoxifen started 09/02/2020   PLAN:  [name] is here for follow-up on tamoxifen.  She is tolerating this very well without any major side effects. She absolutely denies any adverse effects at all.  She tells me that she feels so well as if she never had cancer.  She is very positive spirited, has some great plans for next year, wants to spend a couple months in [place] and is now taking [place] language at [employer].  She was also a [place] major while in college. She is also exercising regularly, eating healthy, has lost about 15 pounds in the past year and is very satisfied by it. No concern physical exam, next mammogram scheduled.  Encouraged her healthy lifestyle and  weight loss.  She was strongly encouraged to call us even when she is in [place] with any new questions or concerns.  Total time spent: 30 minutes  *Total Encounter Time as defined by the Centers for Medicare and Medicaid Services includes, in addition to the face-to-face time of a patient visit (documented in the note above) non-face-to-face time: obtaining and reviewing outside history, ordering and reviewing medications, tests or procedures, care coordination (communications with other health care professionals or caregivers) and documentation in the medical record.

## 2022-09-09 ENCOUNTER — Ambulatory Visit: Payer: Medicare HMO | Admitting: Psychology

## 2022-09-09 DIAGNOSIS — F4322 Adjustment disorder with anxiety: Secondary | ICD-10-CM | POA: Diagnosis not present

## 2022-09-09 DIAGNOSIS — H04123 Dry eye syndrome of bilateral lacrimal glands: Secondary | ICD-10-CM | POA: Diagnosis not present

## 2022-09-09 DIAGNOSIS — H40023 Open angle with borderline findings, high risk, bilateral: Secondary | ICD-10-CM | POA: Diagnosis not present

## 2022-09-09 NOTE — Progress Notes (Signed)
Cheyenne Mcdonald/Therapist Progress Note  Patient ID: Cheyenne Mcdonald, MRN: [id number],    Date: 09/09/2022  Time Spent: 10:05am-10:55am    50 minutes   Treatment Type: Individual Therapy  Reported Symptoms: worrying.  Mental Status Exam: Appearance:  Casual     Behavior: Appropriate  Motor: Normal  Speech/Language:  Normal Rate  Affect: Appropriate  Mood: normal  Thought process: normal  Thought content:   WNL  Sensory/Perceptual disturbances:   WNL  Orientation: oriented to person, place, time/date, and situation  Attention: Good  Concentration: Good  Memory: WNL  Fund of knowledge:  Good  Insight:   Good  Judgment:  Good  Impulse Control: Good   Risk Assessment: Danger to Self:  No Self-injurious Behavior: No Danger to Others: No Duty to Warn:no Physical Aggression / Violence:No  Access to Firearms a concern: No  Gang Involvement:No   Subjective: Pt present for face-to-face individual therapy via video.  Pt consents to telehealth video session and is aware of limitations and benefits of virtual sessions. Location of pt: home Location of therapist: home office.  Pt talked about her granddaughter Cheyenne Mcdonald who was recently laid off from her job.  Pt is worried about Cheyenne Mcdonald.  Addressed pt's concerns and worked on worry management.  Pt talked about her relationship with her husband.  He continues to not want to go anywhere or do anything but pt is doing things on her own.  Pt is preparing for her trip to [place] next year and states she is experiencing joy in the preparation and anticipation of the trip.  She is taking as in person [place] class at [facility] that she is enjoying.   Pt talked about concern about a friend at church.  His wife is dying of cancer.   Pt talked about her weight loss efforts.  She is tracking her food and exercising.  She continues to go to appointments at [facility].   Pt is working on keeping a positive  mindset. Worked on additional self care strategies.   Provided supportive therapy.     Interventions: Cognitive Behavioral Therapy and Insight-Oriented  Diagnosis: F43.22  Plan of Care: Recommend ongoing therapy.  Pt participated in setting treatment goals.  Pt states her goals are to have a safe place to talk and to work on acceptance and to improve coping skills.   Plan to meet every two weeks.   Treatment Plan  (target date: 03/01/2023) Client Abilities/Strengths  Pt is bright, engaging and motivated for therapy.  Client Treatment Preferences  Individual therapy.  Client Statement of Needs  Improve coping skills.  Symptoms  Excessive and/or unrealistic worry that is difficult to control occurring more days than not for at least 6 months about a number of events or activities.  Problems Addressed  Anxiety Goals 1. Enhance ability to effectively cope with the full variety of life's worries and anxieties. 2. Learn and implement coping skills that result in a reduction of anxiety and worry, and improved daily functioning. Objective Learn to accept limitations in life and commit to tolerating, rather than avoiding, unpleasant emotions while accomplishing meaningful goals. Target Date: 2023-03-01 Frequency: Biweekly Progress: 10 Modality: individual Related Interventions 1. Use techniques from Acceptance and Commitment Therapy to help client accept uncomfortable realities such as lack of complete control, imperfections, and uncertainty and tolerate unpleasant emotions and thoughts in order to accomplish value-consistent goals. Objective Learn and implement problem-solving strategies for realistically addressing worries. Target Date: 2023-03-01 Frequency: Biweekly  Progress: 10 Modality: individual Related Interventions 1. Assign the client a homework exercise in which he/she problem-solves a current problem.  review, reinforce success, and provide corrective feedback toward  improvement. 2. Teach the client problem-solving strategies involving specifically defining a problem, generating options for addressing it, evaluating the pros and cons of each option, selecting and implementing an optional action, and reevaluating and refining the action. Objective Learn and implement calming skills to reduce overall anxiety and manage anxiety symptoms. Target Date: 2023-03-01 Frequency: Biweekly Progress: 10 Modality: individual Related Interventions 1. Assign the client to read about progressive muscle relaxation and other calming strategies in relevant books or treatment manuals (e.g., Progressive Relaxation Training by Robb Matar and Alen Blew; Mastery of Your Anxiety and Worry: Workbook by Earlie Counts). 2. Assign the client homework each session in which he/she practices relaxation exercises daily, gradually applying them progressively from non-anxiety-provoking to anxiety-provoking situations; review and reinforce success while providing corrective feedback toward improvement. 3. Teach the client calming/relaxation skills (e.g., applied relaxation, progressive muscle relaxation, cue controlled relaxation; mindful breathing; biofeedback) and how to discriminate better between relaxation and tension; teach the client how to apply these skills to his/her daily life. 3. Reduce overall frequency, intensity, and duration of the anxiety so that daily functioning is not impaired. 4. Resolve the core conflict that is the source of anxiety. 5. Stabilize anxiety level while increasing ability to function on a daily basis. Diagnosis :    F43.22 Conditions For Discharge Achievement of treatment goals and objectives.  Cheyenne Mcmanaway, LCSW

## 2022-09-13 DIAGNOSIS — Z1152 Encounter for screening for COVID-19: Secondary | ICD-10-CM | POA: Diagnosis not present

## 2022-09-14 DIAGNOSIS — D0510 Intraductal carcinoma in situ of unspecified breast: Secondary | ICD-10-CM | POA: Diagnosis not present

## 2022-09-14 DIAGNOSIS — J0100 Acute maxillary sinusitis, unspecified: Secondary | ICD-10-CM | POA: Diagnosis not present

## 2022-09-14 DIAGNOSIS — G4733 Obstructive sleep apnea (adult) (pediatric): Secondary | ICD-10-CM | POA: Diagnosis not present

## 2022-09-14 DIAGNOSIS — E559 Vitamin D deficiency, unspecified: Secondary | ICD-10-CM | POA: Diagnosis not present

## 2022-09-14 DIAGNOSIS — R7303 Prediabetes: Secondary | ICD-10-CM | POA: Diagnosis not present

## 2022-09-14 DIAGNOSIS — E782 Mixed hyperlipidemia: Secondary | ICD-10-CM | POA: Diagnosis not present

## 2022-09-14 DIAGNOSIS — E669 Obesity, unspecified: Secondary | ICD-10-CM | POA: Diagnosis not present

## 2022-09-14 DIAGNOSIS — M1711 Unilateral primary osteoarthritis, right knee: Secondary | ICD-10-CM | POA: Diagnosis not present

## 2022-09-14 DIAGNOSIS — N182 Chronic kidney disease, stage 2 (mild): Secondary | ICD-10-CM | POA: Diagnosis not present

## 2022-09-26 ENCOUNTER — Encounter (INDEPENDENT_AMBULATORY_CARE_PROVIDER_SITE_OTHER): Payer: Self-pay | Admitting: Family Medicine

## 2022-09-26 ENCOUNTER — Ambulatory Visit (INDEPENDENT_AMBULATORY_CARE_PROVIDER_SITE_OTHER): Payer: Medicare HMO | Admitting: Family Medicine

## 2022-09-26 ENCOUNTER — Ambulatory Visit
Admission: RE | Admit: 2022-09-26 | Discharge: 2022-09-26 | Disposition: A | Discharge status: Home / Self Care | Payer: Medicare HMO | Source: Ambulatory Visit | Attending: Hematology and Oncology

## 2022-09-26 VITALS — BP 94/60 | HR 68 | Temp 97.8°F | Ht 63.5 in | Wt 201.0 lb

## 2022-09-26 DIAGNOSIS — R7303 Prediabetes: Secondary | ICD-10-CM

## 2022-09-26 DIAGNOSIS — Z6836 Body mass index (BMI) 36.0-36.9, adult: Secondary | ICD-10-CM

## 2022-09-26 DIAGNOSIS — C50412 Malignant neoplasm of upper-outer quadrant of left female breast: Secondary | ICD-10-CM | POA: Diagnosis not present

## 2022-09-26 DIAGNOSIS — Z171 Estrogen receptor negative status [ER-]: Secondary | ICD-10-CM | POA: Diagnosis not present

## 2022-09-26 DIAGNOSIS — E538 Deficiency of other specified B group vitamins: Secondary | ICD-10-CM | POA: Diagnosis not present

## 2022-09-26 DIAGNOSIS — Z6835 Body mass index (BMI) 35.0-35.9, adult: Secondary | ICD-10-CM | POA: Diagnosis not present

## 2022-09-26 DIAGNOSIS — E559 Vitamin D deficiency, unspecified: Secondary | ICD-10-CM | POA: Diagnosis not present

## 2022-09-26 DIAGNOSIS — I10 Essential (primary) hypertension: Secondary | ICD-10-CM | POA: Diagnosis not present

## 2022-09-26 DIAGNOSIS — R7989 Other specified abnormal findings of blood chemistry: Secondary | ICD-10-CM | POA: Diagnosis not present

## 2022-09-26 DIAGNOSIS — Z9013 Acquired absence of bilateral breasts and nipples: Secondary | ICD-10-CM | POA: Diagnosis not present

## 2022-09-26 DIAGNOSIS — Z853 Personal history of malignant neoplasm of breast: Secondary | ICD-10-CM | POA: Diagnosis not present

## 2022-09-26 NOTE — Progress Notes (Signed)
[name], D.O.  ABFM, ABOM Specializing in Clinical Bariatric Medicine  Office located at: [address] [place], [place]  [address]     Assessment and Plan:   Prediabetes Assessment: Condition is Not at goal.. This is diet/exercise controlled. She endorses having controlled hunger and cravings.  Lab Results  Component Value Date   HGBA1C 5.7 (H) 08/08/2022   HGBA1C 6.1 (H) 04/28/2022   HGBA1C 6.0 (H) 12/08/2021   INSULIN 18.7 08/08/2022   INSULIN 17.6 12/08/2021   INSULIN 16.9 07/27/2021   Plan: - Continue her prudent nutritional plan that is low in simple carbohydrates, saturated fats and trans fats to goal of 5-10% weight loss to achieve significant health benefits.  Pt encouraged to continually advance exercise and cardiovascular fitness as tolerated throughout weight loss journey.  - Anticipatory guidance given.     Vitamin D deficiency Assessment: Condition is Controlled.. Her vitamin D level is within the recommended goal. She continues OTC Cholecalciferol supplements without difficulty. She denies any adverse side effects.  Lab Results  Component Value Date   VD25OH 66.8 08/08/2022   VD25OH 62.7 04/28/2022   VD25OH 67.3 12/08/2021   Plan: - Continue OTC vitamin D oral supplement 5,000lU daily and she was encouraged to continue to take the medicine until told otherwise.     - We will need to monitor levels regularly and recheck in November to keep levels within normal limits and prevent over supplementation.   B12 nutritional deficiency Assessment: Condition is At goal.. This is controlled with OTC Cyanocobalamin supplement.  Lab Results  Component Value Date   VITAMINB12 839 08/08/2022   Plan: - Continue with OTC vitamin B12.  - Eat vitamin B12 rich foods such as dark leafy greens, lean red meat, legumes, etc.   TREATMENT PLAN FOR OBESITY: BMI 36.0-36.9,adult-current BMI 35.04 Morbid obesity (HCC)-start bmi 38.97/date 07/27/21 Assessment:   Cheyenne Mcdonald is here to discuss her progress with her obesity treatment plan along with follow-up of her obesity related diagnoses. See Medical Weight Management Flowsheet for complete bioelectrical impedance results.  Condition is docourse: improving. Biometric data collected today, was reviewed with patient.   Since last office visit on 0819/2024 patient's  Muscle mass has decreased by 0.2lb. Fat mass has decreased by 3.2lb. Total body water has increased by 0.4lb.  Counseling done on how various foods will affect these numbers and how to maximize success  Total lbs lost to date: 24 Total weight loss percentage to date: 10.67%  Plan: -  Continue to follow the Category 1 meal plan and journaling with the goals of [phone] calories and 80+ protein.   Behavioral Intervention Evidence-based interventions for health behavior change were utilized today including the discussion of self monitoring techniques, problem-solving barriers and SMART goal setting techniques.   Regarding patient's less desirable eating habits and patterns, we employed the technique of small changes.  Pt will specifically work on: Contiue exercise, increase water intake, and eat her proteins for next visit.    She has agreed to Continue current level of physical activity   FOLLOW UP: Return in about 4 weeks.  She was informed of the importance of frequent follow up visits to maximize her success with intensive lifestyle modifications for her multiple health conditions.    Subjective:   Chief complaint: Obesity Cheyenne Mcdonald is here to discuss her progress with her obesity treatment plan. She is on the the Category 1 Plan and keeping a food journal and adhering to recommended goals of  [phone] calories and 80+ protein and states she is following her eating plan approximately 85-90% of the time. She states she is walking 60 minutes for the past 20 days.   Interval History:  Cheyenne Mcdonald is here for a follow up office visit.      Since last office visit:  She has been well and has not weighed herself since last OV. She endorses hitting her protein goals and only missing 2 days. She has walked almost every day in the past month. She endorses drinking more water and drinks about 70oz of water daily. She continues to journal her food intake but forgot her log today.    Review of Systems:  Pertinent positives were addressed with patient today.  Reviewed by clinician on day of visit: allergies, medications, problem list, medical history, surgical history, family history, social history, and previous encounter notes.  Weight Summary and Biometrics   Weight Lost Since Last Visit: 3 lb  Weight Gained Since Last Visit: 0   Vitals Temp: 97.8 F (36.6 C) BP: 94/60 Pulse Rate: 68 SpO2: 100 %   Anthropometric Measurements Height: 5' 3.5" (1.613 m) Weight: 201 lb (91.2 kg) BMI (Calculated): 35.04 Weight at Last Visit: 204 lb Weight Lost Since Last Visit: 3 lb Weight Gained Since Last Visit: 0 Starting Weight: 225 lb Total Weight Loss (lbs): 24 lb (10.9 kg) Peak Weight: 240 lb   Body Composition  Body Fat %: 37.5 % Fat Mass (lbs): 75.4 lbs Muscle Mass (lbs): 119.2 lbs Total Body Water (lbs): 83.8 lbs Visceral Fat Rating : 12   Other Clinical Data Fasting: yes Labs: no Today's Visit #: 20 Starting Date: 07/27/21     Objective:   PHYSICAL EXAM: Blood pressure 94/60, pulse 68, temperature 97.8 F (36.6 C), height 5' 3.5" (1.613 m), weight 201 lb (91.2 kg), SpO2 100%. Body mass index is 35.05 kg/m.  General: Well Developed, well nourished, and in no acute distress.  HEENT: Normocephalic, atraumatic Skin: Warm and dry, cap RF less 2 sec, good turgor Chest:  Normal excursion, shape, no gross abn Respiratory: speaking in full sentences, no conversational dyspnea NeuroM-Sk: Ambulates w/o assistance, moves * 4 Psych: A and O *3, insight good, mood-full  DIAGNOSTIC DATA REVIEWED:  BMET     Component Value Date/Time   NA 141 08/08/2022 0955   K 4.1 08/08/2022 0955   CL 104 08/08/2022 0955   CO2 23 08/08/2022 0955   GLUCOSE 92 08/08/2022 0955   GLUCOSE 96 03/03/2022 1026   BUN 20 08/08/2022 0955   CREATININE 1.12 (H) 08/08/2022 0955   CREATININE 1.17 (H) 08/31/2021 1427   CALCIUM 9.3 08/08/2022 0955   GFRNONAA 47 (L) 03/03/2022 1026   GFRNONAA 50 (L) 08/31/2021 1427   GFRAA 58 (L) 10/09/2019 0806   Lab Results  Component Value Date   HGBA1C 5.7 (H) 08/08/2022   HGBA1C 5.9 (H) 07/27/2021   Lab Results  Component Value Date   INSULIN 18.7 08/08/2022   INSULIN 16.9 07/27/2021   Lab Results  Component Value Date   TSH 2.200 07/27/2021   CBC    Component Value Date/Time   WBC 4.1 08/08/2022 0955   WBC 4.4 03/03/2022 1026   RBC 4.15 08/08/2022 0955   RBC 4.20 03/03/2022 1026   HGB 12.9 08/08/2022 0955   HCT 39.0 08/08/2022 0955   PLT 151 08/08/2022 0955   MCV 94 08/08/2022 0955   MCH 31.1 08/08/2022 0955   MCH 31.9 03/03/2022 1026   MCHC  33.1 08/08/2022 0955   MCHC 34.6 03/03/2022 1026   RDW 12.7 08/08/2022 0955   Iron Studies No results found for: "IRON", "TIBC", "FERRITIN", "IRONPCTSAT" Lipid Panel     Component Value Date/Time   CHOL 135 12/08/2021 0906   TRIG 62 12/08/2021 0906   HDL 49 12/08/2021 0906   LDLCALC 73 12/08/2021 0906   Hepatic Function Panel     Component Value Date/Time   PROT 6.5 08/08/2022 0955   ALBUMIN 4.2 08/08/2022 0955   AST 17 08/08/2022 0955   AST 15 08/31/2021 1427   ALT 16 08/08/2022 0955   ALT 14 08/31/2021 1427   ALKPHOS 51 08/08/2022 0955   BILITOT 0.2 08/08/2022 0955   BILITOT 0.4 08/31/2021 1427      Component Value Date/Time   TSH 2.200 07/27/2021 1021   Nutritional Lab Results  Component Value Date   VD25OH 66.8 08/08/2022   VD25OH 62.7 04/28/2022   VD25OH 67.3 12/08/2021    Attestations:   I, Clinical biochemist, acting as a Stage manager for [employer], DO., have compiled all relevant  documentation for today's office visit on behalf of Thomasene Lot, DO, while in the presence of [employer], DO.  I have reviewed the above documentation for accuracy and completeness, and I agree with the above. [name], D.O.  The 21st Century Cures Act was signed into law in 2016 which includes the topic of electronic health records.  This provides immediate access to information in MyChart.  This includes consultation notes, operative notes, office notes, lab results and pathology reports.  If you have any questions about what you read please let us know at your next visit so we can discuss your concerns and take corrective action if need be.  We are right here with you.

## 2022-09-27 ENCOUNTER — Other Ambulatory Visit: Payer: Self-pay | Admitting: Hematology and Oncology

## 2022-09-27 NOTE — Progress Notes (Signed)
Called her back regarding her question about BCI, no answer Requested a call back.  Dashan Chizmar

## 2022-09-28 ENCOUNTER — Telehealth: Payer: Self-pay

## 2022-09-28 NOTE — Telephone Encounter (Signed)
Pt had questions regarding breast cancer risk assessment and if she is eligible. Upon discussing this with Dr. Al Pimple, this RN let patient know that we don't do it until she is close to 5 yrs of anti estrogen therapy, which is her Tamoxifen. This RN informed patient that it can be further discussed at her next appointment in August of next year, or Dr. Al Pimple can call her back. 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 This RN states that if she would like for Dr. Al Pimple to call back with more information, she would be happy to.  Pt states that she is "attending [facility]" in a couple of weeks and will decide if she wants to further discuss the assessment upon her return. This RN verbalized to please give [place] a call back if she has anything she needs anything before then. Pt verbalized understanding.

## 2022-10-05 DIAGNOSIS — Z9189 Other specified personal risk factors, not elsewhere classified: Secondary | ICD-10-CM | POA: Diagnosis not present

## 2022-10-05 DIAGNOSIS — Z853 Personal history of malignant neoplasm of breast: Secondary | ICD-10-CM | POA: Diagnosis not present

## 2022-10-05 DIAGNOSIS — N952 Postmenopausal atrophic vaginitis: Secondary | ICD-10-CM | POA: Diagnosis not present

## 2022-10-05 DIAGNOSIS — Z90710 Acquired absence of both cervix and uterus: Secondary | ICD-10-CM | POA: Diagnosis not present

## 2022-10-05 DIAGNOSIS — Z79810 Long term (current) use of selective estrogen receptor modulators (SERMs): Secondary | ICD-10-CM | POA: Diagnosis not present

## 2022-10-07 ENCOUNTER — Ambulatory Visit: Payer: Medicare HMO | Admitting: Psychology

## 2022-10-24 ENCOUNTER — Ambulatory Visit (INDEPENDENT_AMBULATORY_CARE_PROVIDER_SITE_OTHER): Payer: Medicare HMO | Admitting: Family Medicine

## 2022-10-24 ENCOUNTER — Encounter (INDEPENDENT_AMBULATORY_CARE_PROVIDER_SITE_OTHER): Payer: Self-pay | Admitting: Family Medicine

## 2022-10-24 VITALS — BP 114/71 | HR 68 | Temp 98.0°F | Ht 63.5 in | Wt 203.0 lb

## 2022-10-24 DIAGNOSIS — Z6835 Body mass index (BMI) 35.0-35.9, adult: Secondary | ICD-10-CM

## 2022-10-24 DIAGNOSIS — E559 Vitamin D deficiency, unspecified: Secondary | ICD-10-CM | POA: Diagnosis not present

## 2022-10-24 DIAGNOSIS — R7303 Prediabetes: Secondary | ICD-10-CM

## 2022-10-24 DIAGNOSIS — N1831 Chronic kidney disease, stage 3a: Secondary | ICD-10-CM | POA: Diagnosis not present

## 2022-10-24 DIAGNOSIS — Z6836 Body mass index (BMI) 36.0-36.9, adult: Secondary | ICD-10-CM

## 2022-10-24 NOTE — Progress Notes (Signed)
Cheyenne Mcdonald, D.O.  ABFM, ABOM Specializing in Clinical Bariatric Medicine  Office located at: [address] [place], [place]  [address]     Assessment and Plan:  Recheck labs next OV FI, A1c, Vit D, CMP  Vitamin D deficiency Assessment: Condition is Controlled.. Pt endorses being consistent with taking OTC Cholecalciferol. She tolerates this well and denies any adverse side effects.   Lab Results  Component Value Date   VD25OH 66.8 08/08/2022   VD25OH 62.7 04/28/2022   VD25OH 67.3 12/08/2021   Plan:- Continue with OTC Cholecalciferol at current dose as directed.   - Repeat bone density scan.   - Informed patient this may be a lifelong thing, and she was encouraged to continue to take the medicine until told otherwise.     - weight loss will likely improve availability of vitamin D, thus encouraged Cheyenne Mcdonald to continue with meal plan and their weight loss efforts to further improve this condition.  Thus, we will need to monitor levels regularly (every 3-4 mo on average) to keep levels within normal limits and prevent over supplementation.   Prediabetes Assessment: Condition is Not at goal.. This is diet/exercise controlled. She endorses her hunger and cravings being mainly controlled especially when following the prescribed meal plan although eating off plan some while on vacation.  Lab Results  Component Value Date   HGBA1C 5.7 (H) 08/08/2022   HGBA1C 6.1 (H) 04/28/2022   HGBA1C 6.0 (H) 12/08/2021   INSULIN 18.7 08/08/2022   INSULIN 17.6 12/08/2021   INSULIN 16.9 07/27/2021    Plan: - Intensive lifestyle modification including diet, exercise and weight loss are the first line of treatment for diabetes. We extensively discussed the importance of decreasing simple carbs, increasing proteins to help decrease the risk of progressing to diabetes.   - Drink at least half of your weight in ounces of water per day unless otherwise noted by one of your doctors that you must  restrict water intake.    - We will recheck A1c and fasting insulin level in approximately 3 months from last check, or as deemed appropriate.    Stage 3a chronic kidney disease (HCC) Assessment: Pt endorses not taking medicines that will effect her liver and kidney function. She had a creatinine of 1.12 with a BUN of 18 and a low eGFR of 52 as of 08/08/2022.  Lab Results  Component Value Date   CREATININE 1.12 (H) 08/08/2022   BUN 20 08/08/2022   NA 141 08/08/2022   K 4.1 08/08/2022   CL 104 08/08/2022   CO2 23 08/08/2022      Component Value Date/Time   PROT 6.5 08/08/2022 0955   ALBUMIN 4.2 08/08/2022 0955   AST 17 08/08/2022 0955   AST 15 08/31/2021 1427   ALT 16 08/08/2022 0955   ALT 14 08/31/2021 1427   ALKPHOS 51 08/08/2022 0955   BILITOT 0.2 08/08/2022 0955   BILITOT 0.4 08/31/2021 1427    Plan: - I advised pt to stay hydrated, exercise to increase blood flow, and avoid nephrotoxic medications.    TREATMENT PLAN FOR OBESITY: BMI 36.0-36.9,adult-current BMI 35.39 Morbid obesity (HCC)-start bmi 38.97/date 07/27/21 Assessment:  Cheyenne Mcdonald is here to discuss her progress with her obesity treatment plan along with follow-up of her obesity related diagnoses. See Medical Weight Management Flowsheet for complete bioelectrical impedance results.  Condition is improving but not optimized. Biometric data collected today, was reviewed with patient.   Since last office visit on 09/26/2022  patient's  Muscle mass has increased by 2.6lb. Fat mass is the same. Total body water has increased by 1.2lb.  Counseling done on how various foods will affect these numbers and how to maximize success  Total lbs lost to date: 22 Total weight loss percentage to date: 9.78%   Plan: - Cheyenne Mcdonald will work on healthier eating habits and try to follow the Category 1 Plan and keeping a food journal and adhering to recommended goals of [phone] calories and 80+ protein as best they can.   - I advised  her to drink 1 full bottle of water right when she wakes up, before each meal, and before exercise.   - Pt will check her mychart labs and inform me if she had a Lipid panel done.   Behavioral Intervention Additional resources provided today: patient declined Evidence-based interventions for health behavior change were utilized today including the discussion of self monitoring techniques, problem-solving barriers and SMART goal setting techniques.   Regarding patient's less desirable eating habits and patterns, we employed the technique of small changes.  Pt will specifically work on: Drink 6 bottles of water per day for next visit.    She has agreed to Continue current level of physical activity    FOLLOW UP: Return in about 29 days (around 11/22/2022).  She was informed of the importance of frequent follow up visits to maximize her success with intensive lifestyle modifications for her multiple health conditions.  Subjective:   Chief complaint: Obesity Cheyenne Mcdonald is here to discuss her progress with her obesity treatment plan. She is on the Category 1 Plan and keeping a food journal and adhering to recommended goals of [phone] calories and 80+ protein and states she is following her eating plan approximately 75% of the time. She states she is walking 30 minutes 4 days per week.  Interval History:  Cheyenne Mcdonald is here for a follow up office visit.     Since last office visit:  Since last OV pt endorses being consistent with physical activity. She notes traveling a lot and eating some foods off plan. She also informed me that she is going to [place] in a few. Pt notes not journaling as consistently and notices a difference when not doing it.    We reviewed her meal plan and all questions were answered.   Review of Systems:  Pertinent positives were addressed with patient today.  Reviewed by clinician on day of visit: allergies, medications, problem list, medical history, surgical  history, family history, social history, and previous encounter notes.  Weight Summary and Biometrics   Weight Lost Since Last Visit: 0lb  Weight Gained Since Last Visit: 2lb   Vitals Temp: 98 F (36.7 C) BP: 114/71 Pulse Rate: 68 SpO2: 98 %   Anthropometric Measurements Height: 5' 3.5" (1.613 m) Weight: 203 lb (92.1 kg) BMI (Calculated): 35.39 Weight at Last Visit: 201lb Weight Lost Since Last Visit: 0lb Weight Gained Since Last Visit: 2lb Starting Weight: 225 lb Total Weight Loss (lbs): 22 lb (9.979 kg) Peak Weight: 240 lb   Body Composition  Body Fat %: 37 % Fat Mass (lbs): 75.4 lbs Muscle Mass (lbs): 121.8 lbs Total Body Water (lbs): 85 lbs Visceral Fat Rating : 12   Other Clinical Data Fasting: no Labs: no Today's Visit #: 21 Starting Date: 07/27/21     Objective:   PHYSICAL EXAM: Blood pressure 114/71, pulse 68, temperature 98 F (36.7 C), height 5' 3.5" (1.613 m), weight 203 lb (92.1 kg),  SpO2 98%. Body mass index is 35.4 kg/m.  General: Well Developed, well nourished, and in no acute distress.  HEENT: Normocephalic, atraumatic Skin: Warm and dry, cap RF less 2 sec, good turgor Chest:  Normal excursion, shape, no gross abn Respiratory: speaking in full sentences, no conversational dyspnea NeuroM-Sk: Ambulates w/o assistance, moves * 4 Psych: A and O *3, insight good, mood-full  DIAGNOSTIC DATA REVIEWED:  BMET    Component Value Date/Time   NA 141 08/08/2022 0955   K 4.1 08/08/2022 0955   CL 104 08/08/2022 0955   CO2 23 08/08/2022 0955   GLUCOSE 92 08/08/2022 0955   GLUCOSE 96 03/03/2022 1026   BUN 20 08/08/2022 0955   CREATININE 1.12 (H) 08/08/2022 0955   CREATININE 1.17 (H) 08/31/2021 1427   CALCIUM 9.3 08/08/2022 0955   GFRNONAA 47 (L) 03/03/2022 1026   GFRNONAA 50 (L) 08/31/2021 1427   GFRAA 58 (L) 10/09/2019 0806   Lab Results  Component Value Date   HGBA1C 5.7 (H) 08/08/2022   HGBA1C 5.9 (H) 07/27/2021   Lab Results   Component Value Date   INSULIN 18.7 08/08/2022   INSULIN 16.9 07/27/2021   Lab Results  Component Value Date   TSH 2.200 07/27/2021   CBC    Component Value Date/Time   WBC 4.1 08/08/2022 0955   WBC 4.4 03/03/2022 1026   RBC 4.15 08/08/2022 0955   RBC 4.20 03/03/2022 1026   HGB 12.9 08/08/2022 0955   HCT 39.0 08/08/2022 0955   PLT 151 08/08/2022 0955   MCV 94 08/08/2022 0955   MCH 31.1 08/08/2022 0955   MCH 31.9 03/03/2022 1026   MCHC 33.1 08/08/2022 0955   MCHC 34.6 03/03/2022 1026   RDW 12.7 08/08/2022 0955   Iron Studies No results found for: "IRON", "TIBC", "FERRITIN", "IRONPCTSAT" Lipid Panel     Component Value Date/Time   CHOL 135 12/08/2021 0906   TRIG 62 12/08/2021 0906   HDL 49 12/08/2021 0906   LDLCALC 73 12/08/2021 0906   Hepatic Function Panel     Component Value Date/Time   PROT 6.5 08/08/2022 0955   ALBUMIN 4.2 08/08/2022 0955   AST 17 08/08/2022 0955   AST 15 08/31/2021 1427   ALT 16 08/08/2022 0955   ALT 14 08/31/2021 1427   ALKPHOS 51 08/08/2022 0955   BILITOT 0.2 08/08/2022 0955   BILITOT 0.4 08/31/2021 1427      Component Value Date/Time   TSH 2.200 07/27/2021 1021   Nutritional Lab Results  Component Value Date   VD25OH 66.8 08/08/2022   VD25OH 62.7 04/28/2022   VD25OH 67.3 12/08/2021    Attestations:   I, Clinical biochemist, acting as a Stage manager for [employer], DO., have compiled all relevant documentation for today's office visit on behalf of Thomasene Lot, DO, while in the presence of [employer], DO.  I have reviewed the above documentation for accuracy and completeness, and I agree with the above. Cheyenne Mcdonald, D.O.  The 21st Century Cures Act was signed into law in 2016 which includes the topic of electronic health records.  This provides immediate access to information in MyChart.  This includes consultation notes, operative notes, office notes, lab results and pathology reports.  If you have any  questions about what you read please let us know at your next visit so we can discuss your concerns and take corrective action if need be.  We are right here with you.

## 2022-11-07 ENCOUNTER — Ambulatory Visit: Payer: Medicare HMO | Admitting: Psychology

## 2022-11-22 ENCOUNTER — Ambulatory Visit (INDEPENDENT_AMBULATORY_CARE_PROVIDER_SITE_OTHER): Payer: Medicare HMO | Admitting: Family Medicine

## 2022-11-22 ENCOUNTER — Encounter (INDEPENDENT_AMBULATORY_CARE_PROVIDER_SITE_OTHER): Payer: Self-pay | Admitting: Family Medicine

## 2022-11-22 VITALS — BP 133/79 | HR 64 | Temp 97.9°F | Ht 63.5 in | Wt 202.0 lb

## 2022-11-22 DIAGNOSIS — R7303 Prediabetes: Secondary | ICD-10-CM | POA: Diagnosis not present

## 2022-11-22 DIAGNOSIS — R809 Proteinuria, unspecified: Secondary | ICD-10-CM | POA: Diagnosis not present

## 2022-11-22 DIAGNOSIS — N183 Chronic kidney disease, stage 3 unspecified: Secondary | ICD-10-CM | POA: Diagnosis not present

## 2022-11-22 DIAGNOSIS — I10 Essential (primary) hypertension: Secondary | ICD-10-CM | POA: Diagnosis not present

## 2022-11-22 DIAGNOSIS — N1831 Chronic kidney disease, stage 3a: Secondary | ICD-10-CM

## 2022-11-22 DIAGNOSIS — S50319A Abrasion of unspecified elbow, initial encounter: Secondary | ICD-10-CM | POA: Diagnosis not present

## 2022-11-22 DIAGNOSIS — Z6836 Body mass index (BMI) 36.0-36.9, adult: Secondary | ICD-10-CM

## 2022-11-22 DIAGNOSIS — W19XXXA Unspecified fall, initial encounter: Secondary | ICD-10-CM | POA: Diagnosis not present

## 2022-11-22 DIAGNOSIS — Z6835 Body mass index (BMI) 35.0-35.9, adult: Secondary | ICD-10-CM | POA: Diagnosis not present

## 2022-11-22 DIAGNOSIS — S46911A Strain of unspecified muscle, fascia and tendon at shoulder and upper arm level, right arm, initial encounter: Secondary | ICD-10-CM | POA: Diagnosis not present

## 2022-11-22 NOTE — Progress Notes (Signed)
[name], D.O.  ABFM, ABOM Specializing in Clinical Bariatric Medicine  Office located at: [address] [place], [place]  [address]   Assessment and Plan:   FOR THE DISEASE OF OBESITY:  Since last office visit on 10/24/22 patient's muscle mass has decreased by 8 lbs. Fat mass has increased by 7 lb. Total body water has decreased by 4.2 lb. Counseling done on how various foods will affect these numbers and how to maximize success. Counseling also done on body fat percentage goals.   Total lbs lost to date: 23 lbs  Total weight loss percentage to date: 10.22%    Recommended Dietary Goals Cheyenne Mcdonald is currently in the action stage of change. As such, her goal is to continue weight management plan.  She has agreed to: continue current plan   Behavioral Intervention We discussed the following Behavioral Modification Strategies today: increasing lean protein intake to established goals, decreasing simple carbohydrates , continuing to track and journal calories, healthy eating strategies during holidays, increasing water intake.   Additional resources provided today: Journaling log, holiday favorites recipe.   Evidence-based interventions for health behavior change were utilized today including the discussion of self monitoring techniques, problem-solving barriers and SMART goal setting techniques.   Regarding patient's less desirable eating habits and patterns, we employed the technique of small changes.   Pt will specifically: bring in food log.   Recommended Physical Activity Goals Verdelle has been advised to work up to 150 minutes of moderate intensity aerobic activity a week and strengthening exercises 2-3 times per week for cardiovascular health, weight loss maintenance and preservation of muscle mass.   She has agreed to : walk 10,000 steps, 5+ days a week.   Pharmacotherapy We both agreed to : continue with nutritional and behavioral strategies   FOR ASSOCIATED  CONDITIONS ADDRESSED TODAY:  Prediabetes Assessment & Plan: Most recent Hemoglobin A1c of 5.7 & Fasting Insulin of 18.7 on 08/08/22. No current meds. Diet/exercise controlled. Her hunger and cravings are better controlled when following her reduced calorie nutritional plan.   Continue weight loss therapy- decrease simple carbs/ sugars; increase fiber and protein. Encouraged pt to obtain Lipid panel with PCP at next OV as part of routine screening.   Orders: -     Hemoglobin A1c -     Insulin, random   Stage 3a chronic kidney disease (HCC) Assessment & Plan: Pt will be seeing nephrologist this Thursday and will be going in for blood work today. Thus, we will hold off on rechecking CMP. Told pt to bring hard copy of labs from nephrologist.    BMI 36.0-36.9,adult-current BMI 35.22 Morbid obesity (HCC)-start bmi 38.97/date 07/27/21 Assessment & Plan: See obesity treatment note.    FOLLOW UP: Return 12/15/22. She was informed of the importance of frequent follow up visits to maximize her success with intensive lifestyle modifications for her multiple health conditions.  Cheyenne Mcdonald is aware that we will review all of her lab results at our next visit.  She is aware that if anything is critical/ life threatening with the results, we will be contacting her via MyChart prior to the office visit to discuss management.    Subjective:   Chief complaint: Obesity Cheyenne Mcdonald is here to discuss her progress with her obesity treatment plan. She is on the Category 1 Plan and keeping a food journal and adhering to recommended goals of [phone] calories and 80+ protein and states she is following her eating plan approximately 50% of  the time. She states she is walking 60-75 minutes 2 days per week.  Interval History:  Cheyenne Mcdonald is here for a follow up office visit. Since last OV, Cheyenne Mcdonald is down 1 lb. 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 Reports not being protein focused and eating more carbohydrates and sweets. Exercise wise, pt was walking 10,000 steps, 3-5 days a week prior to Nov 5.   Barriers identified: travel & schedule.   Pharmacotherapy for weight loss: She is currently taking no anti-obesity medication.   Review of Systems:  Pertinent positives were addressed with patient today.  Reviewed by clinician on day of visit: allergies, medications, problem list, medical history, surgical history, family history, social history, and previous encounter notes.  Weight Summary and Biometrics   Weight Lost Since Last Visit: 1lb  Weight Gained Since Last Visit: 0lb   Vitals Temp: 97.9 F (36.6 C) BP: 133/79 Pulse Rate: 64 SpO2: 99 %   Anthropometric Measurements Height: 5' 3.5" (1.613 m) Weight: 202 lb (91.6 kg) BMI (Calculated): 35.22 Weight at Last Visit: 203lb Weight Lost Since Last Visit: 1lb Weight Gained Since Last Visit: 0lb Starting Weight: 225lb Total Weight Loss (lbs): 23 lb (10.4 kg) Peak Weight: 240lb   Body Composition  Body Fat %: 40.8 % Fat Mass (lbs): 82.4 lbs Muscle Mass (lbs): 113.8 lbs Total Body Water (lbs): 80.8 lbs Visceral Fat Rating : 13   Other Clinical Data Fasting: yes Labs: yes Today's Visit #: 22 Starting Date: 07/27/21   Objective:   PHYSICAL EXAM: Blood pressure 133/79, pulse 64, temperature 97.9 F (36.6 C), height 5' 3.5" (1.613 m), weight 202 lb (91.6 kg), SpO2 99%. Body mass index is 35.22 kg/m.  General: she is overweight, cooperative and in no acute distress. PSYCH: Has normal mood, affect and thought process.   HEENT: EOMI, sclerae are anicteric. Lungs: Normal breathing effort, no conversational dyspnea. Extremities: Moves * 4 Neurologic: A and O * 3, good insight  DIAGNOSTIC DATA REVIEWED: BMET    Component Value Date/Time   NA 141 08/08/2022 0955   K 4.1 08/08/2022 0955   CL 104 08/08/2022 0955   CO2 23  08/08/2022 0955   GLUCOSE 92 08/08/2022 0955   GLUCOSE 96 03/03/2022 1026   BUN 20 08/08/2022 0955   CREATININE 1.12 (H) 08/08/2022 0955   CREATININE 1.17 (H) 08/31/2021 1427   CALCIUM 9.3 08/08/2022 0955   GFRNONAA 47 (L) 03/03/2022 1026   GFRNONAA 50 (L) 08/31/2021 1427   GFRAA 58 (L) 10/09/2019 0806   Lab Results  Component Value Date   HGBA1C 5.7 (H) 08/08/2022   HGBA1C 5.9 (H) 07/27/2021   Lab Results  Component Value Date   INSULIN 18.7 08/08/2022   INSULIN 16.9 07/27/2021   Lab Results  Component Value Date   TSH 2.200 07/27/2021   CBC    Component Value Date/Time   WBC 4.1 08/08/2022 0955   WBC 4.4 03/03/2022 1026   RBC 4.15 08/08/2022 0955   RBC 4.20 03/03/2022 1026   HGB 12.9 08/08/2022 0955   HCT 39.0 08/08/2022 0955   PLT 151 08/08/2022 0955   MCV 94 08/08/2022 0955   MCH 31.1 08/08/2022 0955   MCH 31.9 03/03/2022 1026   MCHC 33.1 08/08/2022 0955   MCHC 34.6 03/03/2022 1026   RDW 12.7 08/08/2022 0955   Iron Studies No results found for: "IRON", "TIBC", "FERRITIN", "IRONPCTSAT"  Lipid Panel     Component Value Date/Time   CHOL 135 12/08/2021 0906   TRIG 62 12/08/2021 0906   HDL 49 12/08/2021 0906   LDLCALC 73 12/08/2021 0906   Hepatic Function Panel     Component Value Date/Time   PROT 6.5 08/08/2022 0955   ALBUMIN 4.2 08/08/2022 0955   AST 17 08/08/2022 0955   AST 15 08/31/2021 1427   ALT 16 08/08/2022 0955   ALT 14 08/31/2021 1427   ALKPHOS 51 08/08/2022 0955   BILITOT 0.2 08/08/2022 0955   BILITOT 0.4 08/31/2021 1427      Component Value Date/Time   TSH 2.200 07/27/2021 1021   Nutritional Lab Results  Component Value Date   VD25OH 66.8 08/08/2022   VD25OH 62.7 04/28/2022   VD25OH 67.3 12/08/2021    Attestations:   I, Special Puri, acting as a Stage manager for [employer], DO., have compiled all relevant documentation for today's office visit on behalf of Thomasene Lot, DO, while in the presence of [employer],  DO.  Reviewed by clinician on day of visit: allergies, medications, problem list, medical history, surgical history, family history, social history, and previous encounter notes pertinent to patient's obesity diagnosis.I have spent 40 minutes in the care of the patient today including preparing to see patient (e.g. review and interpretation of tests, old notes ), obtaining and/or reviewing separately obtained history, performing a medically appropriate examination or evaluation, counseling and educating the patient, ordering medications, test or procedures, documenting clinical information in the electronic or other health care record, and independently interpreting results and communicating results to the patient, family, or caregiver   I have reviewed the above documentation for accuracy and completeness, and I agree with the above. [name], D.O.  The 21st Century Cures Act was signed into law in 2016 which includes the topic of electronic health records.  This provides immediate access to information in MyChart.  This includes consultation notes, operative notes, office notes, lab results and pathology reports.  If you have any questions about what you read please let us know at your next visit so we can discuss your concerns and take corrective action if need be.  We are right here with you.

## 2022-11-23 LAB — INSULIN, RANDOM: INSULIN: 14.6 u[IU]/mL (ref 2.6–24.9)

## 2022-11-23 LAB — HEMOGLOBIN A1C
Est. average glucose Bld gHb Est-mCnc: 123 mg/dL
Hgb A1c MFr Bld: 5.9 % — ABNORMAL HIGH (ref 4.8–5.6)

## 2022-11-24 DIAGNOSIS — I10 Essential (primary) hypertension: Secondary | ICD-10-CM | POA: Diagnosis not present

## 2022-11-24 DIAGNOSIS — N183 Chronic kidney disease, stage 3 unspecified: Secondary | ICD-10-CM | POA: Diagnosis not present

## 2022-11-24 DIAGNOSIS — G4733 Obstructive sleep apnea (adult) (pediatric): Secondary | ICD-10-CM | POA: Diagnosis not present

## 2022-11-24 DIAGNOSIS — N390 Urinary tract infection, site not specified: Secondary | ICD-10-CM | POA: Diagnosis not present

## 2022-11-24 DIAGNOSIS — E669 Obesity, unspecified: Secondary | ICD-10-CM | POA: Diagnosis not present

## 2022-11-24 DIAGNOSIS — E790 Hyperuricemia without signs of inflammatory arthritis and tophaceous disease: Secondary | ICD-10-CM | POA: Diagnosis not present

## 2022-12-15 ENCOUNTER — Ambulatory Visit (INDEPENDENT_AMBULATORY_CARE_PROVIDER_SITE_OTHER): Payer: Medicare HMO | Admitting: Family Medicine

## 2022-12-27 DIAGNOSIS — E559 Vitamin D deficiency, unspecified: Secondary | ICD-10-CM | POA: Diagnosis not present

## 2022-12-27 DIAGNOSIS — E669 Obesity, unspecified: Secondary | ICD-10-CM | POA: Diagnosis not present

## 2022-12-27 DIAGNOSIS — N182 Chronic kidney disease, stage 2 (mild): Secondary | ICD-10-CM | POA: Diagnosis not present

## 2022-12-27 DIAGNOSIS — E782 Mixed hyperlipidemia: Secondary | ICD-10-CM | POA: Diagnosis not present

## 2022-12-27 DIAGNOSIS — R7303 Prediabetes: Secondary | ICD-10-CM | POA: Diagnosis not present

## 2022-12-27 DIAGNOSIS — G4733 Obstructive sleep apnea (adult) (pediatric): Secondary | ICD-10-CM | POA: Diagnosis not present

## 2022-12-27 DIAGNOSIS — M1711 Unilateral primary osteoarthritis, right knee: Secondary | ICD-10-CM | POA: Diagnosis not present

## 2022-12-28 ENCOUNTER — Telehealth: Payer: Self-pay

## 2022-12-28 MED ORDER — TAMOXIFEN CITRATE 20 MG PO TABS: 20.0000 mg | ORAL_TABLET | Freq: Every day | ORAL | 3 refills | Status: DC

## 2022-12-28 NOTE — Telephone Encounter (Signed)
Pt called for refill on tamoxifen. Refilled per MD. Pt aware.

## 2023-01-16 ENCOUNTER — Encounter: Payer: Self-pay | Admitting: Oncology

## 2023-01-16 ENCOUNTER — Ambulatory Visit (INDEPENDENT_AMBULATORY_CARE_PROVIDER_SITE_OTHER): Payer: Medicare HMO | Admitting: Family Medicine

## 2023-01-17 DIAGNOSIS — E669 Obesity, unspecified: Secondary | ICD-10-CM | POA: Diagnosis not present

## 2023-01-17 DIAGNOSIS — R7303 Prediabetes: Secondary | ICD-10-CM | POA: Diagnosis not present

## 2023-01-17 DIAGNOSIS — G4733 Obstructive sleep apnea (adult) (pediatric): Secondary | ICD-10-CM | POA: Diagnosis not present

## 2023-01-17 DIAGNOSIS — M1711 Unilateral primary osteoarthritis, right knee: Secondary | ICD-10-CM | POA: Diagnosis not present

## 2023-01-17 DIAGNOSIS — N182 Chronic kidney disease, stage 2 (mild): Secondary | ICD-10-CM | POA: Diagnosis not present

## 2023-01-17 DIAGNOSIS — E559 Vitamin D deficiency, unspecified: Secondary | ICD-10-CM | POA: Diagnosis not present

## 2023-01-17 DIAGNOSIS — E782 Mixed hyperlipidemia: Secondary | ICD-10-CM | POA: Diagnosis not present

## 2023-01-19 ENCOUNTER — Ambulatory Visit (INDEPENDENT_AMBULATORY_CARE_PROVIDER_SITE_OTHER): Payer: Medicare HMO | Admitting: Family Medicine

## 2023-01-23 ENCOUNTER — Encounter: Payer: Self-pay | Admitting: *Deleted

## 2023-01-23 ENCOUNTER — Ambulatory Visit: Payer: Medicare PPO | Attending: Surgery

## 2023-01-23 ENCOUNTER — Ambulatory Visit: Payer: Medicare PPO | Admitting: Psychology

## 2023-01-23 ENCOUNTER — Encounter: Payer: Self-pay | Admitting: Oncology

## 2023-01-23 VITALS — Wt 208.1 lb

## 2023-01-23 DIAGNOSIS — Z483 Aftercare following surgery for neoplasm: Secondary | ICD-10-CM | POA: Insufficient documentation

## 2023-01-23 DIAGNOSIS — Z171 Estrogen receptor negative status [ER-]: Secondary | ICD-10-CM | POA: Insufficient documentation

## 2023-01-23 DIAGNOSIS — M25511 Pain in right shoulder: Secondary | ICD-10-CM

## 2023-01-23 DIAGNOSIS — I890 Lymphedema, not elsewhere classified: Secondary | ICD-10-CM | POA: Insufficient documentation

## 2023-01-23 DIAGNOSIS — F4322 Adjustment disorder with anxiety: Secondary | ICD-10-CM | POA: Diagnosis not present

## 2023-01-23 DIAGNOSIS — C50412 Malignant neoplasm of upper-outer quadrant of left female breast: Secondary | ICD-10-CM | POA: Insufficient documentation

## 2023-01-23 NOTE — Progress Notes (Signed)
 [facility] Counselor/Therapist Progress Note  Patient ID: Cheyenne Mcdonald, MRN: [id number],    Date: 01/23/2023  Time Spent: 4:10pm-5:00pm     50 minutes   Treatment Type: Individual Therapy  Reported Symptoms: worrying.  Mental Status Exam: Appearance:  Casual     Behavior: Appropriate  Motor: Normal  Speech/Language:  Normal Rate  Affect: Appropriate  Mood: normal  Thought process: normal  Thought content:   WNL  Sensory/Perceptual disturbances:   WNL  Orientation: oriented to person, place, time/date, and situation  Attention: Good  Concentration: Good  Memory: WNL  Fund of knowledge:  Good  Insight:   Good  Judgment:  Good  Impulse Control: Good   Risk Assessment: Danger to Self:  No Self-injurious Behavior: No Danger to Others: No Duty to Warn:no Physical Aggression / Violence:No  Access to Firearms a concern: No  Gang Involvement:No   Subjective: Pt present for face-to-face individual therapy via video.  Pt consents to telehealth video session and is aware of limitations and benefits of virtual sessions. Location of pt: home Location of therapist: home office.  Pt talked about her granddaughter Cheyenne Mcdonald taking medication to transition to a female.  Pt is still having difficulties accepting this.   She does however maintain a close relationship with Cheyenne Mcdonald.   Pt is planning a birthday party for her 52 yo mother.   Pt does not want Cheyenne Mcdonald to dress like a female at the party bc it would be difficult for pt's mother to deal with.   Addressed how pt can communicate her request to Cheyenne Mcdonald.   Pt is doing a lot to prepare for her trip to [place] in October.   She has gotten A's in her french classes.  She is planning to take additional classes and will be planning her travel itinerary.    Pt talked about her husband Cheyenne Mcdonald.  He seems to be worse.  He just sits and watches tv 10-12 hours a day.  He does not initiate conversation.  Pt feels frustrated.  Helped pt  process her feelings and concerns about her husband.   Pt talked about her health.  She fell and hurt her shoulder and is getting PT.   She had gout at Christmas.  She stayed home for a few days and took care of herself and watched movies.   Pt is working on keeping a positive mindset. Worked on additional self care strategies.   Provided supportive therapy.     Interventions: Cognitive Behavioral Therapy and Insight-Oriented  Diagnosis: F43.22  Plan of Care: Recommend ongoing therapy.  Pt participated in setting treatment goals.  Pt states her goals are to have a safe place to talk and to work on acceptance and to improve coping skills.   Plan to meet every two weeks.   Treatment Plan  (target date: 03/01/2023) Client Abilities/Strengths  Pt is bright, engaging and motivated for therapy.  Client Treatment Preferences  Individual therapy.  Client Statement of Needs  Improve coping skills.  Symptoms  Excessive and/or unrealistic worry that is difficult to control occurring more days than not for at least 6 months about a number of events or activities.  Problems Addressed  Anxiety Goals 1. Enhance ability to effectively cope with the full variety of life's worries and anxieties. 2. Learn and implement coping skills that result in a reduction of anxiety and worry, and improved daily functioning. Objective Learn to accept limitations in life and commit to tolerating, rather than avoiding, unpleasant  emotions while accomplishing meaningful goals. Target Date: 2023-03-01 Frequency: Biweekly Progress: 10 Modality: individual Related Interventions 1. Use techniques from Acceptance and Commitment Therapy to help client accept uncomfortable realities such as lack of complete control, imperfections, and uncertainty and tolerate unpleasant emotions and thoughts in order to accomplish value-consistent goals. Objective Learn and implement problem-solving strategies for realistically addressing  worries. Target Date: 2023-03-01 Frequency: Biweekly Progress: 10 Modality: individual Related Interventions 1. Assign the client a homework exercise in which he/she problem-solves a current problem.  review, reinforce success, and provide corrective feedback toward improvement. 2. Teach the client problem-solving strategies involving specifically defining a problem, generating options for addressing it, evaluating the pros and cons of each option, selecting and implementing an optional action, and reevaluating and refining the action. Objective Learn and implement calming skills to reduce overall anxiety and manage anxiety symptoms. Target Date: 2023-03-01 Frequency: Biweekly Progress: 10 Modality: individual Related Interventions 1. Assign the client to read about progressive muscle relaxation and other calming strategies in relevant books or treatment manuals (e.g., Progressive Relaxation Training by Thornell and Elmer; Mastery of Your Anxiety and Worry: Workbook by Richarda armin Given). 2. Assign the client homework each session in which he/she practices relaxation exercises daily, gradually applying them progressively from non-anxiety-provoking to anxiety-provoking situations; review and reinforce success while providing corrective feedback toward improvement. 3. Teach the client calming/relaxation skills (e.g., applied relaxation, progressive muscle relaxation, cue controlled relaxation; mindful breathing; biofeedback) and how to discriminate better between relaxation and tension; teach the client how to apply these skills to his/her daily life. 3. Reduce overall frequency, intensity, and duration of the anxiety so that daily functioning is not impaired. 4. Resolve the core conflict that is the source of anxiety. 5. Stabilize anxiety level while increasing ability to function on a daily basis. Diagnosis :    F43.22 Conditions For Discharge Achievement of treatment goals and  objectives.  Cheyenne Iseman, LCSW

## 2023-01-23 NOTE — Therapy (Signed)
 OUTPATIENT PHYSICAL THERAPY SOZO SCREENING NOTE   Patient Name: Cheyenne Mcdonald MRN: [id number] DOB:[birth year], 73 y.o., female Today's Date: 01/23/2023  PCP: Rosalea Rosina SAILOR, PA REFERRING PROVIDER: Vernetta Berg, MD   PT End of Session - 01/23/23 [phone]     Visit Number 13   # unchanged due to screen only   PT Start Time 0915    PT Stop Time 0922    PT Time Calculation (min) 7 min    Activity Tolerance Patient tolerated treatment well    Behavior During Therapy [facility] for tasks assessed/performed             Past Medical History:  Diagnosis Date   Anemia    Arthritis    gout big toe   Breast cancer (HCC)    left breast IDC, right breast DCIS   History of radiation therapy 07/20/2020   bilateral breasts 06/01/2020-07/20/2020  Dr Lynwood Nasuti   Hypertension    Kidney problem    Obesity    Personal history of chemotherapy    2022 bilat lumpectomies w/rad w/chemo   Personal history of radiation therapy    2022 bilat lumpectomies w/rad w/chemo   Prediabetes    Sleep apnea    does not use CPAP   Vitamin D  deficiency    Past Surgical History:  Procedure Laterality Date   ABDOMINAL HYSTERECTOMY     BREAST LUMPECTOMY Bilateral    2022 bilat lumpectomies w/rad w/chemo   BREAST LUMPECTOMY WITH RADIOACTIVE SEED AND SENTINEL LYMPH NODE BIOPSY Left 04/16/2020   Procedure: LEFT BREAST LUMPECTOMY WITH RADIOACTIVE SEED AND LEFT AXILLARY SENTINEL LYMPH NODE BIOPSY;  Surgeon: Vernetta Berg, MD;  Location: [facility];  Service: General;  Laterality: Left;   BREAST LUMPECTOMY WITH RADIOACTIVE SEED LOCALIZATION Right 04/16/2020   Procedure: RIGHT BREAST LUMPECTOMY WITH RADIOACTIVE SEED LOCALIZATION;  Surgeon: Vernetta Berg, MD;  Location: [facility];  Service: General;  Laterality: Right;   IR IMAGING GUIDED PORT INSERTION  10/18/2019   PORT-A-CATH REMOVAL Right 09/02/2020   Procedure: REMOVAL PORT-A-CATH;  Surgeon: Vernetta Berg, MD;   Location: [facility];  Service: General;  Laterality: Right;   Patient Active Problem List   Diagnosis Date Noted   Stage 3a chronic kidney disease (HCC) - elevated serum crt 05/11/2022   Elevated serum creatinine 04/28/2022   Adjustment reaction to stress, with emotional eating 04/12/2022   OSA (obstructive sleep apnea) 04/01/2022   Adjustment disorder 02/21/2022   BMI 37.0-37.9, adult-current bmi 37.9 02/10/2022   Morbid obesity (HCC)-start bmi 38.97 02/10/2022   B12 deficiency 12/08/2021   Class 2 severe obesity with serious comorbidity and body mass index (BMI) of 39.0 to 39.9 in adult [facility]) 12/08/2021   Vitamin D  deficiency 07/27/2021   Primary hypertension 07/27/2021   Prediabetes 07/27/2021   Ductal carcinoma in situ (DCIS) of right breast 05/06/2020   Genetic testing 11/04/2019   Port-A-Cath in place 10/22/2019   Malignant neoplasm of upper-outer quadrant of left breast in female, estrogen receptor negative (HCC) 10/03/2019   Arthropathy of ankle and foot 09/24/2009   HALLUX RIGIDUS 09/24/2009    REFERRING DIAG: left breast cancer at risk for lymphedema  THERAPY DIAG:  Aftercare following surgery for neoplasm  PERTINENT HISTORY: Patient was diagnosed on 09/30/2019 with 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 It measures 9 mm and 1.4 cm both located in the upper outer quadrant. Ki67 is 90%; Lt lumpectomy and SLNB and  Rt DCIS lumpectomy 04/16/2020.   PRECAUTIONS: left UE Lymphedema risk, None  SUBJECTIVE: Pt returns for her 6 month L-Dex screen. I fell about 2 months ago and my Rt shoulder is still sore. It doesn't hurt, I can just tell I can't reach as high as I could before.  PAIN:  Are you having pain? No  SOZO SCREENING: Patient was assessed today using the SOZO machine to determine the lymphedema index score. This was compared to her baseline score. It was determined that she is within the  recommended range when compared to her baseline and no further action is needed at this time. She will continue SOZO screenings. These are done every 3 months for 2 years post operatively followed by every 6 months for 2 years, and then annually.  Patient reported a change in status to PTA which initiated the PTA consulting with a PT. PT determined it would be appropriate to initiate therapy at this time. PT requested a referral from patient's provider.    L-DEX FLOWSHEETS - 01/23/23 0900       L-DEX LYMPHEDEMA SCREENING   Measurement Type Unilateral    L-DEX MEASUREMENT EXTREMITY Upper Extremity    POSITION  Standing    DOMINANT SIDE Left    At Risk Side Right    BASELINE SCORE (UNILATERAL) 3.3    L-DEX SCORE (UNILATERAL) 7.1    VALUE CHANGE (UNILAT) 3.8            P: Cont 6 month L-Dex screens. Eval Rt shoulder since fall.    Aden Berwyn Caldron, PTA 01/23/2023, 9:24 AM

## 2023-02-01 ENCOUNTER — Ambulatory Visit (INDEPENDENT_AMBULATORY_CARE_PROVIDER_SITE_OTHER): Payer: Medicare PPO | Admitting: Family Medicine

## 2023-02-01 ENCOUNTER — Encounter (INDEPENDENT_AMBULATORY_CARE_PROVIDER_SITE_OTHER): Payer: Self-pay | Admitting: Family Medicine

## 2023-02-01 VITALS — BP 125/76 | HR 75 | Temp 98.2°F | Ht 63.5 in | Wt 199.0 lb

## 2023-02-01 DIAGNOSIS — R7303 Prediabetes: Secondary | ICD-10-CM

## 2023-02-01 DIAGNOSIS — A084 Viral intestinal infection, unspecified: Secondary | ICD-10-CM | POA: Diagnosis not present

## 2023-02-01 DIAGNOSIS — Z6836 Body mass index (BMI) 36.0-36.9, adult: Secondary | ICD-10-CM

## 2023-02-01 DIAGNOSIS — Z6835 Body mass index (BMI) 35.0-35.9, adult: Secondary | ICD-10-CM

## 2023-02-01 NOTE — Progress Notes (Signed)
Cheyenne Mcdonald, D.O.  ABFM, ABOM Specializing in Clinical Bariatric Medicine  Office located at: [address] [place], [place]  [address]   Assessment and Plan:   FOR THE DISEASE OF OBESITY: BMI 36.0-36.9,adult-current BMI 35.22 Morbid obesity (HCC)-start bmi 38.97/date 07/27/21 Assessment & Plan: Since last office visit on 11/22/22 patient's muscle mass has increased by 1lb. Fat mass has decreased by 3.8lb. Total body water has decreased by 7.2lb.  Counseling done on how various foods will affect these numbers and how to maximize success  Total lbs lost to date: 26 lbs Total weight loss percentage to date: -11.56%    Recommended Dietary Goals Cheyenne Mcdonald is currently in the action stage of change. As such, her goal is to continue weight management plan.  She has agreed to: continue current plan   Behavioral Intervention We discussed the following today: increasing lean protein intake to established goals, decreasing simple carbohydrates , increasing vegetables, increasing water intake , keeping healthy foods at home, continue to work on maintaining a reduced calorie state, getting the recommended amount of protein, incorporating whole foods, making healthy choices, staying well hydrated and practicing mindfulness when eating., and using GPT or another AI platform for recipe ideas- searching "low calorie, low carb, high protein chicken recipes" etc  Additional resources provided today: Handout on complex carbohydrates and lean sources of protein and non-starchy vegetables (Chat GPT).  Evidence-based interventions for health behavior change were utilized today including the discussion of self monitoring techniques, problem-solving barriers and SMART goal setting techniques.   Regarding patient's less desirable eating habits and patterns, we employed the technique of small changes.   Pt will specifically work on:  Get back into water aerobics at least 3 days per week and walking at  least 2 days per week for next visit.    Recommended Physical Activity Goals Cheyenne Mcdonald has been advised to work up to 150 minutes of moderate intensity aerobic activity a week and strengthening exercises 2-3 times per week for cardiovascular health, weight loss maintenance and preservation of muscle mass.   She has agreed to :  Increase physical activity in their day and reduce sedentary time (increase NEAT). and will start water aerobics with goal of at least 3 days per week and continue walking 2 days per week.    Pharmacotherapy We discussed various medication options to help Cheyenne Mcdonald with her weight loss efforts and we both agreed to : continue with nutritional and behavioral strategies   FOR ASSOCIATED CONDITIONS ADDRESSED TODAY: Viral gastroenteritis Assessment & Plan: Pt endorses GI upset, vomiting, and diarrhea. 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   I recommend Keandrea follow a BRAT diet to help improve her condition. Pt advised to avoid any foods that are fried, spicy, fattening, or dairy products given they may worse her symptoms. Avoid these foods until symptom free for 3-5 days. Drink plenty water and replace electrolytes to avoid dehydration.    Prediabetes Assessment & Plan: Lab Results  Component Value Date   HGBA1C 5.9 (H) 11/22/2022   HGBA1C 5.7 (H) 08/08/2022   HGBA1C 6.1 (H) 04/28/2022   INSULIN 14.6 11/22/2022   INSULIN 18.7 08/08/2022   INSULIN 17.6 12/08/2021   A1C was above goal at 5.9 when last checked on 11/22/22. Not currently on any meds. Diet/exercise approach. Pt walking daily but notes limited activity due to a foot/heel injury.   Follow reduced calorie and  high protein nutritional plan with at least 30 g of protein at each meal. Encouraged pt to eat lean proteins and non-starchy vegetables. Stay well hydrated by drinking at least half of her body weight in ounces of water per day. Increase  exercise by adding water aerobics at least 3 days per week. Reviewed using Chat GPT as a resource to find new recipes. Continue with current regimen, no changes made today.    Follow up:   Return in about 4 weeks (around 03/01/2023). She was informed of the importance of frequent follow up visits to maximize her success with intensive lifestyle modifications for her multiple health conditions.  Subjective:   Chief complaint: Obesity Cheyenne Mcdonald is here to discuss her progress with her obesity treatment plan. She is on the the Category 1 Plan and keeping a food journal and adhering to recommended goals of [phone] calories and 80+ g of protein and states she is following her eating plan approximately 25% of the time. She states she is walking 20 minutes 2 days per week.  Interval History:  [name] is here for a follow up office visit. Since last OV, she so down 3 lbs. Pt reports foot/heel pain that limited her ability to walk for a few days. She later got sick with a GI bug (known exposure form her mother), and was unable to "keep food down". With her GI bug, she has found it difficult to stay on plan. Had grits and little cheese yesterday morning and cheese toast last night. Reports eating roasted vegetables and vomited the food up.   Barriers identified: low volume of physical activity at present  and Foot/heel pain and GI bug .   Pharmacotherapy for weight loss: She is currently taking no anti-obesity medication.   Review of Systems:  Pertinent positives were addressed with patient today.  Reviewed by clinician on day of visit: allergies, medications, problem list, medical history, surgical history, family history, social history, and previous encounter notes.  Weight Summary and Biometrics   Weight Lost Since Last Visit: 3 lb  Weight Gained Since Last Visit: 0   Vitals Temp: 98.2 F (36.8 C) BP: 125/76 Pulse Rate: 75 SpO2: 98 %   Anthropometric Measurements Height: 5' 3.5"  (1.613 m) Weight: 199 lb (90.3 kg) BMI (Calculated): 34.69 Weight at Last Visit: 202 lb Weight Lost Since Last Visit: 3 lb Weight Gained Since Last Visit: 0 Starting Weight: 225 lb Total Weight Loss (lbs): 26 lb (11.8 kg)   Body Composition  Body Fat %: 39.4 % Fat Mass (lbs): 78.6 lbs Muscle Mass (lbs): 114.8 lbs Total Body Water (lbs): 73.6 lbs Visceral Fat Rating : 13   Other Clinical Data Fasting: no Labs: no Today's Visit #: 23 Starting Date: 07/27/21    Objective:   PHYSICAL EXAM: Blood pressure 125/76, pulse 75, temperature 98.2 F (36.8 C), height 5' 3.5" (1.613 m), weight 199 lb (90.3 kg), SpO2 98%. Body mass index is 34.7 kg/m.  General: she is overweight, cooperative and in no acute distress. PSYCH: Has normal mood, affect and thought process.   HEENT: EOMI, sclerae are anicteric. Lungs: Normal breathing effort, no conversational dyspnea. Extremities: Moves * 4 Neurologic: A and O * 3, good insight  DIAGNOSTIC DATA REVIEWED: BMET    Component Value Date/Time   NA 141 08/08/2022 0955   K 4.1 08/08/2022 0955   CL 104 08/08/2022 0955   CO2 23 08/08/2022 0955   GLUCOSE 92 08/08/2022 0955   GLUCOSE 96 03/03/2022  1026   BUN 20 08/08/2022 0955   CREATININE 1.12 (H) 08/08/2022 0955   CREATININE 1.17 (H) 08/31/2021 1427   CALCIUM 9.3 08/08/2022 0955   GFRNONAA 47 (L) 03/03/2022 1026   GFRNONAA 50 (L) 08/31/2021 1427   GFRAA 58 (L) 10/09/2019 0806   Lab Results  Component Value Date   HGBA1C 5.9 (H) 11/22/2022   HGBA1C 5.9 (H) 07/27/2021   Lab Results  Component Value Date   INSULIN 14.6 11/22/2022   INSULIN 16.9 07/27/2021   Lab Results  Component Value Date   TSH 2.200 07/27/2021   CBC    Component Value Date/Time   WBC 4.1 08/08/2022 0955   WBC 4.4 03/03/2022 1026   RBC 4.15 08/08/2022 0955   RBC 4.20 03/03/2022 1026   HGB 12.9 08/08/2022 0955   HCT 39.0 08/08/2022 0955   PLT 151 08/08/2022 0955   MCV 94 08/08/2022 0955   MCH 31.1  08/08/2022 0955   MCH 31.9 03/03/2022 1026   MCHC 33.1 08/08/2022 0955   MCHC 34.6 03/03/2022 1026   RDW 12.7 08/08/2022 0955   Iron Studies No results found for: "IRON", "TIBC", "FERRITIN", "IRONPCTSAT" Lipid Panel     Component Value Date/Time   CHOL 135 12/08/2021 0906   TRIG 62 12/08/2021 0906   HDL 49 12/08/2021 0906   LDLCALC 73 12/08/2021 0906   Hepatic Function Panel     Component Value Date/Time   PROT 6.5 08/08/2022 0955   ALBUMIN 4.2 08/08/2022 0955   AST 17 08/08/2022 0955   AST 15 08/31/2021 1427   ALT 16 08/08/2022 0955   ALT 14 08/31/2021 1427   ALKPHOS 51 08/08/2022 0955   BILITOT 0.2 08/08/2022 0955   BILITOT 0.4 08/31/2021 1427      Component Value Date/Time   TSH 2.200 07/27/2021 1021   Nutritional Lab Results  Component Value Date   VD25OH 66.8 08/08/2022   VD25OH 62.7 04/28/2022   VD25OH 67.3 12/08/2021    Attestations:   I, Isabelle Course, acting as a Stage manager for Thomasene Lot, DO., have compiled all relevant documentation for today's office visit on behalf of Thomasene Lot, DO, while in the presence of [employer], DO.  Reviewed by clinician on day of visit: allergies, medications, problem list, medical history, surgical history, family history, social history, and previous encounter notes pertinent to patient's obesity diagnosis.  I have spent 30 minutes in the care of the patient today including: preparing to see patient (e.g. review and interpretation of tests, old notes ), obtaining and/or reviewing separately obtained history, performing a medically appropriate examination or evaluation, counseling and educating the patient, ordering medications, test or procedures, documenting clinical information in the electronic or other health care record, and independently interpreting results and communicating results to the patient, family, or caregiver   I have reviewed the above documentation for accuracy and completeness, and I  agree with the above. Cheyenne Mcdonald, D.O.  The 21st Century Cures Act was signed into law in 2016 which includes the topic of electronic health records.  This provides immediate access to information in MyChart.  This includes consultation notes, operative notes, office notes, lab results and pathology reports.  If you have any questions about what you read please let us know at your next visit so we can discuss your concerns and take corrective action if need be.  We are right here with you.

## 2023-02-01 NOTE — Therapy (Signed)
OUTPATIENT PHYSICAL THERAPY  UPPER EXTREMITY ONCOLOGY EVALUATION  Patient Name: Cheyenne Mcdonald MRN: [id number] DOB:October 08, 1950, 73 y.o., female Today's Date: 02/02/2023  END OF SESSION:  PT End of Session - 02/02/23 1416     Visit Number 1    Number of Visits 1    Authorization Type needed for more visits    PT Start Time 1000    PT Stop Time 1035    PT Time Calculation (min) 35 min    Activity Tolerance Patient tolerated treatment well    Behavior During Therapy [facility] for tasks assessed/performed             Past Medical History:  Diagnosis Date   Anemia    Arthritis    gout big toe   Breast cancer (HCC)    left breast IDC, right breast DCIS   History of radiation therapy 07/20/2020   bilateral breasts 06/01/2020-07/20/2020  Dr Antony Blackbird   Hypertension    Kidney problem    Obesity    Personal history of chemotherapy    2022 bilat lumpectomies w/rad w/chemo   Personal history of radiation therapy    2022 bilat lumpectomies w/rad w/chemo   Prediabetes    Sleep apnea    does not use CPAP   Vitamin D deficiency    Past Surgical History:  Procedure Laterality Date   ABDOMINAL HYSTERECTOMY     BREAST LUMPECTOMY Bilateral    2022 bilat lumpectomies w/rad w/chemo   BREAST LUMPECTOMY WITH RADIOACTIVE SEED AND SENTINEL LYMPH NODE BIOPSY Left 04/16/2020   Procedure: LEFT BREAST LUMPECTOMY WITH RADIOACTIVE SEED AND LEFT AXILLARY SENTINEL LYMPH NODE BIOPSY;  Surgeon: Abigail Miyamoto, MD;  Location: [facility];  Service: General;  Laterality: Left;   BREAST LUMPECTOMY WITH RADIOACTIVE SEED LOCALIZATION Right 04/16/2020   Procedure: RIGHT BREAST LUMPECTOMY WITH RADIOACTIVE SEED LOCALIZATION;  Surgeon: Abigail Miyamoto, MD;  Location: [facility];  Service: General;  Laterality: Right;   IR IMAGING GUIDED PORT INSERTION  10/18/2019   PORT-A-CATH REMOVAL Right 09/02/2020   Procedure: REMOVAL PORT-A-CATH;  Surgeon: Abigail Miyamoto, MD;  Location:  [facility];  Service: General;  Laterality: Right;   Patient Active Problem List   Diagnosis Date Noted   Stage 3a chronic kidney disease (HCC) - elevated serum crt 05/11/2022   Elevated serum creatinine 04/28/2022   Adjustment reaction to stress, with emotional eating 04/12/2022   OSA (obstructive sleep apnea) 04/01/2022   Adjustment disorder 02/21/2022   BMI 37.0-37.9, adult-current bmi 37.9 02/10/2022   Morbid obesity (HCC)-start bmi 38.97 02/10/2022   B12 deficiency 12/08/2021   Class 2 severe obesity with serious comorbidity and body mass index (BMI) of 39.0 to 39.9 in adult [facility]) 12/08/2021   Vitamin D deficiency 07/27/2021   Primary hypertension 07/27/2021   Prediabetes 07/27/2021   Ductal carcinoma in situ (DCIS) of right breast 05/06/2020   Genetic testing 11/04/2019   Port-A-Cath in place 10/22/2019   Malignant neoplasm of upper-outer quadrant of left breast in female, estrogen receptor negative (HCC) 10/03/2019   Arthropathy of ankle and foot 09/24/2009   HALLUX RIGIDUS 09/24/2009    PCP: Norva Riffle, PA  REFERRING PROVIDER: Dr. Rachel Moulds  REFERRING DIAG:  Diagnosis  M25.511 (ICD-10-CM) - Right shoulder pain, unspecified chronicity   THERAPY DIAG:  Aftercare following surgery for neoplasm  Lymphedema, not elsewhere classified  Malignant neoplasm of upper-outer quadrant of left breast in female, estrogen receptor negative (HCC)  Acute pain of right shoulder  ONSET DATE: 11/11/22  Rationale for Evaluation and Treatment: Rehabilitation  SUBJECTIVE:                                                                                                                                                                                           SUBJECTIVE STATEMENT: Fell around 2 months ago and feels like the shoulder can't reach as high. I landed on my Rt shoulder.  Its like the bone hurts.  At night I feel like a nagging ache.  During the day if I had  to lift up something heavy it would be hard and a bit painful.  I haven't had any swelling.    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 It measures 9 mm and 1.4 cm both located in the upper outer quadrant. Ki67 is 90%; Lt lumpectomy and SLNB and Rt DCIS lumpectomy 04/16/2020. Normal SOZO throughout.   PAIN:  Are you having pain? No . Yes pain only at night - like a bone pain NPRS scale: 4/10 Pain location: upper arm region Pain orientation: Right  PAIN TYPE: aching and dull Pain description: intermittent  Aggravating factors: lifting heavy, at night  Relieving factors: I just don't do anything different   PRECAUTIONS: Rt lymphedema risk   RED FLAGS: None   WEIGHT BEARING RESTRICTIONS: No  FALLS:  Has patient fallen in last 6 months? No  LIVING ENVIRONMENT: Lives with: lives with their spouse  OCCUPATION: Retired  LEISURE: walking, water aerobics   HAND DOMINANCE: right   PRIOR LEVEL OF FUNCTION: Independent  PATIENT GOALS: check out the Rt shoulder    OBJECTIVE: Note: Objective measures were completed at Evaluation unless otherwise noted.  COGNITION: Overall cognitive status: Within functional limits for tasks assessed   PALPATION: No ttp at the Rt shoulder  OBSERVATIONS / OTHER ASSESSMENTS: nothing notable   POSTURE: WNL  UPPER EXTREMITY AROM/PROM:  A/PROM RIGHT   eval   Shoulder extension 50 - pn   Shoulder flexion 145 - painful on lowering / 155   Shoulder abduction 150 - painful up and down / 170  Shoulder internal rotation   Shoulder external rotation 80 / 80    (Blank rows = not tested)  A/PROM LEFT   eval  Shoulder extension 63  Shoulder flexion 145  Shoulder abduction 153  Shoulder internal rotation   Shoulder external rotation 80    (Blank rows = not tested)  CERVICAL AROM: All within normal limits:   UPPER EXTREMITY  STRENGTH: 5/5 Lt,  4+/5 with pain on flexion, ER, .  Did not test abduction due to pain with AROM                                                                                                                            TREATMENT DATE:  02/02/23 Eval performed Discussed night pain, painful arc, and PROM WNL and how we should get an orthopedic MD check on this before continuing with PT visits.   Pt agreeable Wrote a note to Dr. Al Pimple to advise. Pt will look for a call from PT for plan.    PATIENT EDUCATION:  Education details: per today's note Person educated: Patient Education method: Explanation Education comprehension: verbalized understanding  HOME EXERCISE PROGRAM:   ASSESSMENT:  CLINICAL IMPRESSION: Patient is a 73 y.o. female who was seen today for physical therapy evaluation and treatment for shoulder and upper arm pain after a fall around 10-12 weeks ago.  She reports that she landed on the upper arm/shoulder and that it has not really gotten better since then.  She is having mostly night pain and pain with AROM.  Due to signs of a rotator cuff impairment a note was sent to Dr. Al Pimple who states she does not care where she goes to an ortho consult and that she would also recommend this.  Pt was called and she understands the plan .    OBJECTIVE IMPAIRMENTS: decreased ROM and pain.   ACTIVITY LIMITATIONS: carrying and lifting  PARTICIPATION LIMITATIONS: none  PERSONAL FACTORS: Age and Fitness are also affecting patient's functional outcome.   REHAB POTENTIAL: Excellent  CLINICAL DECISION MAKING: Evolving/moderate complexity  EVALUATION COMPLEXITY: Moderate  GOALS: Goals reviewed with patient? Yes  SHORT TERM GOALS: Target date: 02/02/23  Pt will be knowledgeable about how to contact an orthopedic shoulder physician for imaging and recommendations before starting PT Baseline: Goal status: MET   PLAN:  PT FREQUENCY: one time visit  PT DURATION: 1  week  PLANNED INTERVENTIONS: 97164- PT Re-evaluation, 97110-Therapeutic exercises, 97535- Self Care, 27035- Manual therapy, Patient/Family education, Balance training, Joint mobilization, Therapeutic exercises, Therapeutic activity, Neuromuscular re-education, Gait training, and Self Care  PLAN FOR NEXT SESSION: reassess as needed after ortho visit.   Idamae Lusher, PT 02/02/2023, 2:17 PM

## 2023-02-02 ENCOUNTER — Other Ambulatory Visit: Payer: Self-pay

## 2023-02-02 ENCOUNTER — Ambulatory Visit: Payer: Medicare PPO | Admitting: Rehabilitation

## 2023-02-02 ENCOUNTER — Encounter: Payer: Self-pay | Admitting: Rehabilitation

## 2023-02-02 DIAGNOSIS — Z483 Aftercare following surgery for neoplasm: Secondary | ICD-10-CM

## 2023-02-02 DIAGNOSIS — M25511 Pain in right shoulder: Secondary | ICD-10-CM

## 2023-02-02 DIAGNOSIS — I890 Lymphedema, not elsewhere classified: Secondary | ICD-10-CM | POA: Diagnosis not present

## 2023-02-02 DIAGNOSIS — Z171 Estrogen receptor negative status [ER-]: Secondary | ICD-10-CM

## 2023-02-02 DIAGNOSIS — C50412 Malignant neoplasm of upper-outer quadrant of left female breast: Secondary | ICD-10-CM | POA: Diagnosis not present

## 2023-02-05 NOTE — Progress Notes (Unsigned)
HPI F never smoker followed for OSA, complicated by  HTN, Arthropathy, hx R Breast Cancer L, Obesity,  NPSG 06/14/22- AHI 7.7/ hr, desaturation to 81%, body weight 210 lbs  ==============================================================================  08/04/22-  71 yoF never smoker followed for OSA, complicated by  HTN, Arthropathy, hx R Breast Cancer L, Obesity,  NPSG 06/14/22- AHI 7.7/ hr, desaturation to 81%, body weight 210 lbs Body weight today-214 lbs For treatment decision She is ready to work on her weight as part of conservative management for her very mild OSA. Other interventions can be chosen later if weight loss is insufficient or ineffective.  02/07/23- 72 yoF never smoker followed for mild OSA, complicated by  HTN, Arthropathy, hx R Breast Cancer L, Obesity,  Body weight today- 208 lbs [facility] Discussed the use of AI scribe software for clinical note transcription with the patient, who gave verbal consent to proceed.  History of Present Illness   The patient, who is not currently working, reports waking up at night, often around three o'clock, but is able to fall back asleep. She has noticed this pattern for some time, with wake-up times ranging from three seventeen to four twenty one. Despite these disturbances, she is able to sleep a little later in the morning due to her current lack of employment. She has not sought the use of short-acting sleep medicines and typically does not engage in activities such as reading or watching television when she wakes up at night. She expresses some concern about her sleep pattern, comparing it to her ninety-five-year-old mother who sleeps through the night without disturbances. She had chosen to lose weight to address her very mild OSA, and is happy with her progress.  In addition to her sleep disturbances, the patient mentions feeling winded sometimes when going up the stairs, especially if carrying things. She is also working  on losing weight and has lost almost twenty pounds over a little over a year with the [facility] program. She is planning a two-month trip to [place] and is focusing on improving her health before the trip.     ROS-see HPI   + = positive Constitutional:    +weight loss, night sweats, fevers, chills, fatigue, lassitude. HEENT:    headaches, difficulty swallowing, tooth/dental problems, sore throat,       sneezing, itching, ear ache, nasal congestion, post nasal drip, snoring CV:    chest pain, orthopnea, PND, swelling in lower extremities, anasarca,                                   dizziness, palpitations Resp:   +shortness of breath with exertion or at rest.                productive cough,   non-productive cough, coughing up of blood.              change in color of mucus.  wheezing.   Skin:    rash or lesions. GI:  No-   heartburn, indigestion, abdominal pain, nausea, vomiting, diarrhea,                 change in bowel habits, loss of appetite GU: dysuria, change in color of urine, no urgency or frequency.   flank pain. MS:   joint pain, stiffness, decreased range of motion, back pain. Neuro-     nothing unusual Psych:  change in mood or affect.  depression or anxiety.   memory loss.  OBJ- Physical Exam General- Alert, Oriented, Affect-appropriate, Distress- none acute, +overweight Skin- rash-none, lesions- none, excoriation- none Lymphadenopathy- none Head- atraumatic            Eyes- Gross vision intact, PERRLA, conjunctivae and secretions clear            Ears- Hearing, canals-normal            Nose- Clear, no-Septal dev, mucus, polyps, erosion, perforation             Throat- Mallampati II , mucosa clear , drainage- none, tonsils- atrophic Neck- flexible , trachea midline, no stridor , thyroid nl, carotid no bruit Chest - symmetrical excursion , unlabored           Heart/CV- RRR ,  murmur+faint S , no gallop  , no rub, nl s1 s2                           - JVD- none ,  edema- none, stasis changes- none, varices- none           Lung- clear to P&A, wheeze- none, cough- none , dullness-none, rub- none           Chest wall-  Abd-  Br/ Gen/ Rectal- Not done, not indicated Extrem- cyanosis- none, clubbing, none, atrophy- none, strength- nl Neuro- grossly intact to observation  Assessment and Plan    Insomnia Waking up in the middle of the night but able to fall back asleep. 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 Discussed normal sleep patterns and the potential use of short-acting sleep medications if needed. -No changes to current management.  Mild Sleep Apnea Not causing any medical problems or straining the heart. -No changes to current management.  Weight Management Patient has lost almost 20 pounds over the past year with [facility]. -Continue with current weight loss program.  General Health Maintenance -Continue monitoring sleep patterns and weight management. -Return to clinic as needed.

## 2023-02-07 ENCOUNTER — Encounter: Payer: Self-pay | Admitting: Internal Medicine

## 2023-02-07 ENCOUNTER — Ambulatory Visit (INDEPENDENT_AMBULATORY_CARE_PROVIDER_SITE_OTHER): Payer: Medicare PPO | Admitting: Internal Medicine

## 2023-02-07 VITALS — BP 118/72 | HR 68 | Ht 63.5 in | Wt 208.4 lb

## 2023-02-07 DIAGNOSIS — G4700 Insomnia, unspecified: Secondary | ICD-10-CM

## 2023-02-07 DIAGNOSIS — G4733 Obstructive sleep apnea (adult) (pediatric): Secondary | ICD-10-CM | POA: Diagnosis not present

## 2023-02-07 NOTE — Patient Instructions (Signed)
Glad you are doing well.  Enjoy your trip to [place]!

## 2023-02-08 DIAGNOSIS — S42141A Displaced fracture of glenoid cavity of scapula, right shoulder, initial encounter for closed fracture: Secondary | ICD-10-CM | POA: Diagnosis not present

## 2023-03-01 ENCOUNTER — Ambulatory Visit (INDEPENDENT_AMBULATORY_CARE_PROVIDER_SITE_OTHER): Payer: Medicare PPO | Admitting: Family Medicine

## 2023-03-01 ENCOUNTER — Encounter (INDEPENDENT_AMBULATORY_CARE_PROVIDER_SITE_OTHER): Payer: Self-pay | Admitting: Family Medicine

## 2023-03-01 VITALS — BP 113/73 | HR 87 | Temp 98.2°F | Ht 63.5 in | Wt 207.0 lb

## 2023-03-01 DIAGNOSIS — E559 Vitamin D deficiency, unspecified: Secondary | ICD-10-CM | POA: Diagnosis not present

## 2023-03-01 DIAGNOSIS — R7303 Prediabetes: Secondary | ICD-10-CM | POA: Diagnosis not present

## 2023-03-01 DIAGNOSIS — Z6836 Body mass index (BMI) 36.0-36.9, adult: Secondary | ICD-10-CM | POA: Diagnosis not present

## 2023-03-01 DIAGNOSIS — E538 Deficiency of other specified B group vitamins: Secondary | ICD-10-CM | POA: Diagnosis not present

## 2023-03-01 NOTE — Progress Notes (Signed)
Cheyenne Mcdonald, D.O.  ABFM, ABOM Specializing in Clinical Bariatric Medicine  Office located at: [address] [place], [place]  [address]   Assessment and Plan:   Come fasting to next OV for fasting labs (Lipid panel, A1C, Insulin, Mg, BMP, Vit D, and B12)  FOR THE DISEASE OF OBESITY: BMI 36.0-36.9,adult - Current BMI 36.09 Morbid obesity (HCC) - Start BMI 38.97/date 07/27/21 Assessment & Plan: Since last office visit on 02/01/23 patient's muscle mass has not changed. Fat mass has increased by 7.6 lb. Total body water has increased by 6.4lb.  Counseling done on how various foods will affect these numbers and how to maximize success  Total lbs lost to date: 18 lbs Total weight loss percentage to date: -8.0%    Recommended Dietary Goals Cheyenne Mcdonald is currently in the action stage of change. As such, her goal is to continue weight management plan.  She has agreed to: continue current plan - CAT 1 meal plan as a guide and recommit to journaling [phone] calories and 80+ g of protein.    Behavioral Intervention We discussed the following today: increasing lean protein intake to established goals, decreasing simple carbohydrates , work on meal planning and preparation, keeping healthy foods at home, decreasing eating out or consumption of processed foods, and making healthy choices when eating convenient foods, and staying on track while traveling and vacationing  Additional resources provided today: None  Evidence-based interventions for health behavior change were utilized today including the discussion of self monitoring techniques, problem-solving barriers and SMART goal setting techniques.   Regarding patient's less desirable eating habits and patterns, we employed the technique of small changes.   Pt will specifically work on: Increase exercise to at least 45-60 minutes 3 days per week, recommit to journaling daily, and meal prep 2 days a week for next visit.    Recommended  Physical Activity Goals Cheyenne Mcdonald has been advised to work up to 150 minutes of moderate intensity aerobic activity a week and strengthening exercises 2-3 times per week for cardiovascular health, weight loss maintenance and preservation of muscle mass.   She has agreed to :  Think about enjoyable ways to increase daily physical activity and overcoming barriers to exercise, Increase physical activity in their day and reduce sedentary time (increase NEAT)., and Start strengthening exercises with a goal of 2-3 sessions a week    Pharmacotherapy We both agreed to: continue with nutritional and behavioral strategies   FOR ASSOCIATED CONDITIONS ADDRESSED TODAY: Prediabetes Assessment & Plan: Lab Results  Component Value Date   HGBA1C 5.9 (H) 11/22/2022   HGBA1C 5.7 (H) 08/08/2022   HGBA1C 6.1 (H) 04/28/2022   INSULIN 14.6 11/22/2022   INSULIN 18.7 08/08/2022   INSULIN 17.6 12/08/2021    Pt is not on meds currently. Diet/exercise approach. She has recently not been exercising recently and is not journaling consistently. Reports eating off-plan recently. Taking a trip to [place] soon and plans to start exercising again.   Encouraged pt to resume consistent journaling and get back on track with her exercise routine. Work on Automotive engineer protein intake and decreasing simple carb/sugar intake per her nutritional meal plan. Will continue to monitor condition and recheck A1C, insulin, BMP, lipid panel, and Mg at her next OV.  Orders: - Future labs ordered: recheck A1C, Insulin, BMP, Lipid panel, and Magnesium.   B12 nutritional deficiency Assessment & Plan: Lab Results  Component Value Date   VITAMINB12 839 08/08/2022   Pt is on B12 supplementation,  taking 300-500 mcg once daily. Tolerating well with no adverse side effects. Monitoring of condition will be continued and B12 levels will be rechecked at her next OV.  Orders: - Future labs ordered: recheck B12   Vitamin D deficiency Assessment  & Plan: Lab Results  Component Value Date   VD25OH 66.8 08/08/2022   VD25OH 62.7 04/28/2022   VD25OH 67.3 12/08/2021   Most recent Vit D was at goal at 66.8. Pt is on Vit D3 5,000 IUs daily. Tolerating well with no SE reported. Continue with current supplementation regimen. Will continue to monitor condition and recheck vitamin D at her next OV.  Orders: - Future labs ordered: recheck Vitamin D   Follow up:   Return in about 4 weeks (around 03/29/2023). She was informed of the importance of frequent follow up visits to maximize her success with intensive lifestyle modifications for her multiple health conditions.  Subjective:   Chief complaint: Obesity Cheyenne Mcdonald is here to discuss her progress with her obesity treatment plan. She is on the Category 1 Plan and keeping a food journal and adhering to recommended goals of [phone] calories and 80+ g of protein and states she is following her eating plan approximately 50% of the time. She states she is not exercising.  Interval History:  Cheyenne Mcdonald is here for a follow up office visit. Since her last OV on 02/01/23, she had a large party for her mother's 95th birthday and reports off-plan celebration eating. She has not been journaling recently. Last night she went out to eat at [facility] and ate a rib eye steak, broccoli, and a baked potato. She has gotten back into water aerobics but she was not able to stay consistent due to the cold weather. Pt has a trip to [place] planned in about 5-6 months.   Pharmacotherapy for weight loss: She is currently taking no anti-obesity medication.   Review of Systems:  Pertinent positives were addressed with patient today.  Reviewed by clinician on day of visit: allergies, medications, problem list, medical history, surgical history, family history, social history, and previous encounter notes.  Weight Summary and Biometrics   Weight Lost Since Last Visit: 0  Weight Gained Since Last Visit: 8  lb   Vitals Temp: 98.2 F (36.8 C) BP: 113/73 Pulse Rate: 87 SpO2: 99 %   Anthropometric Measurements Height: 5' 3.5" (1.613 m) Weight: 207 lb (93.9 kg) BMI (Calculated): 36.09 Weight at Last Visit: 199 lb Weight Lost Since Last Visit: 0 Weight Gained Since Last Visit: 8 lb Starting Weight: 225 lb Total Weight Loss (lbs): 18 lb (8.165 kg)   Body Composition  Body Fat %: 41.6 % Fat Mass (lbs): 86.2 lbs Muscle Mass (lbs): 114.8 lbs Total Body Water (lbs): 80 lbs Visceral Fat Rating : 14   Other Clinical Data Fasting: No Labs: No Today's Visit #: 24 Starting Date: 07/27/21    Objective:   PHYSICAL EXAM: Blood pressure 113/73, pulse 87, temperature 98.2 F (36.8 C), height 5' 3.5" (1.613 m), weight 207 lb (93.9 kg), SpO2 99%. Body mass index is 36.09 kg/m.  General: she is overweight, cooperative and in no acute distress. PSYCH: Has normal mood, affect and thought process.   HEENT: EOMI, sclerae are anicteric. Lungs: Normal breathing effort, no conversational dyspnea. Extremities: Moves * 4 Neurologic: A and O * 3, good insight  DIAGNOSTIC DATA REVIEWED: BMET    Component Value Date/Time   NA 141 08/08/2022 0955   K 4.1 08/08/2022 0955  CL 104 08/08/2022 0955   CO2 23 08/08/2022 0955   GLUCOSE 92 08/08/2022 0955   GLUCOSE 96 03/03/2022 1026   BUN 20 08/08/2022 0955   CREATININE 1.12 (H) 08/08/2022 0955   CREATININE 1.17 (H) 08/31/2021 1427   CALCIUM 9.3 08/08/2022 0955   GFRNONAA 47 (L) 03/03/2022 1026   GFRNONAA 50 (L) 08/31/2021 1427   GFRAA 58 (L) 10/09/2019 0806   Lab Results  Component Value Date   HGBA1C 5.9 (H) 11/22/2022   HGBA1C 5.9 (H) 07/27/2021   Lab Results  Component Value Date   INSULIN 14.6 11/22/2022   INSULIN 16.9 07/27/2021   Lab Results  Component Value Date   TSH 2.200 07/27/2021   CBC    Component Value Date/Time   WBC 4.1 08/08/2022 0955   WBC 4.4 03/03/2022 1026   RBC 4.15 08/08/2022 0955   RBC 4.20  03/03/2022 1026   HGB 12.9 08/08/2022 0955   HCT 39.0 08/08/2022 0955   PLT 151 08/08/2022 0955   MCV 94 08/08/2022 0955   MCH 31.1 08/08/2022 0955   MCH 31.9 03/03/2022 1026   MCHC 33.1 08/08/2022 0955   MCHC 34.6 03/03/2022 1026   RDW 12.7 08/08/2022 0955   Iron Studies No results found for: "IRON", "TIBC", "FERRITIN", "IRONPCTSAT" Lipid Panel     Component Value Date/Time   CHOL 135 12/08/2021 0906   TRIG 62 12/08/2021 0906   HDL 49 12/08/2021 0906   LDLCALC 73 12/08/2021 0906   Hepatic Function Panel     Component Value Date/Time   PROT 6.5 08/08/2022 0955   ALBUMIN 4.2 08/08/2022 0955   AST 17 08/08/2022 0955   AST 15 08/31/2021 1427   ALT 16 08/08/2022 0955   ALT 14 08/31/2021 1427   ALKPHOS 51 08/08/2022 0955   BILITOT 0.2 08/08/2022 0955   BILITOT 0.4 08/31/2021 1427      Component Value Date/Time   TSH 2.200 07/27/2021 1021   Nutritional Lab Results  Component Value Date   VD25OH 66.8 08/08/2022   VD25OH 62.7 04/28/2022   VD25OH 67.3 12/08/2021    Attestations:   I, Cheyenne Mcdonald, acting as a Stage manager for Thomasene Lot, DO., have compiled all relevant documentation for today's office visit on behalf of Thomasene Lot, DO, while in the presence of [employer]yenne [employer], DO.  Reviewed by clinician on day of visit: allergies, medications, problem list, medical history, surgical history, family history, social history, and previous encounter notes pertinent to patient's obesity diagnosis.  I have reviewed the above documentation for accuracy and completeness, and I agree with the above. Cheyenne Mcdonald, D.O.  The 21st Century Cures Act was signed into law in 2016 which includes the topic of electronic health records.  This provides immediate access to information in MyChart.  This includes consultation notes, operative notes, office notes, lab results and pathology reports.  If you have any questions about what you read please let us know at your next  visit so we can discuss your concerns and take corrective action if need be.  We are right here with you.

## 2023-03-15 DIAGNOSIS — H04123 Dry eye syndrome of bilateral lacrimal glands: Secondary | ICD-10-CM | POA: Diagnosis not present

## 2023-03-15 DIAGNOSIS — H43813 Vitreous degeneration, bilateral: Secondary | ICD-10-CM | POA: Diagnosis not present

## 2023-03-15 DIAGNOSIS — H40023 Open angle with borderline findings, high risk, bilateral: Secondary | ICD-10-CM | POA: Diagnosis not present

## 2023-03-24 ENCOUNTER — Ambulatory Visit: Payer: Medicare PPO | Admitting: Psychology

## 2023-03-24 DIAGNOSIS — F4322 Adjustment disorder with anxiety: Secondary | ICD-10-CM | POA: Diagnosis not present

## 2023-03-24 NOTE — Progress Notes (Signed)
 [facility] Counselor Initial Adult Exam  Name: Cheyenne Mcdonald Date: 03/24/2023 MRN: [id number] DOB: 1950/08/01 PCP: Norm Salt, PA  Time spent: 10:00am-10:50am    50 minutes  Guardian/Payee:  Cheyenne Mcdonald requested: No   Reason for Visit /Presenting Problem: Pt present for face-to-face initial assessment update via video.  Pt consents to telehealth video session and is aware of limitations and benefits of virtual sessions. Location of pt: home Location of therapist: home office.  Pt has made a lot of progress this year.   She has worked on acceptance issues with her granddaughter's transition to female.     Pt is worried about her husband who is showing signs of cognitive decline and does not want to do things.   Pt is learning to travel and do the things she enjoys on her own.  She has planned a trip to [place] for August 2nd through September 27th this year.   Pt is finding a lot of joy in planning for the trip.   She is working on letting go of expectations of her husband.   Pt has a 74 yo mother who she looks out for.  Pt tends to cope by emotionally eating and would like to continue to learn better coping skills.   Reviewed pt's treatment plan for annual update.   Updated treatment plan and IA.   Pt participated in setting treatment goals.   Plan to meet monthly.   Mental Status Exam: Appearance:   Casual     Behavior:  Appropriate  Motor:  Normal  Speech/Language:   Normal Rate  Affect:  Appropriate  Mood:  normal  Thought process:  normal  Thought content:    WNL  Sensory/Perceptual disturbances:    WNL  Orientation:  oriented to person, place, time/date, and situation  Attention:  Good  Concentration:  Good  Memory:  WNL  Fund of knowledge:   Good  Insight:    Good  Judgment:   Good  Impulse Control:  Good     Reported Symptoms:  worrying , nervous at times.   Risk Assessment: Danger to Self:  No Self-injurious Behavior: No Danger to  Others: No Duty to Warn:no Physical Aggression / Violence:No  Access to Firearms a concern: No  Gang Involvement:No  Patient / guardian was educated about steps to take if suicide or homicide risk level increases between visits: n/a While future psychiatric events cannot be accurately predicted, the patient does not currently require acute inpatient psychiatric care and does not currently meet [facility] involuntary commitment criteria.  Substance Abuse History: Current substance abuse: No     Past Psychiatric History:   No previous psychological problems have been observed Outpatient Providers:this is pt's first time in therapy.  History of Psych Hospitalization: No  Psychological Testing:  n/a    Abuse History:  Victim of: No.,  n/a    Report needed: No. Victim of Neglect:No. Perpetrator of  n/a   Witness / Exposure to Domestic Violence: No   Protective Services Involvement: No  Witness to [employer] Violence:  No   Family History:  Family History  Problem Relation Age of Onset   High Cholesterol Mother    Breast cancer Cousin 35       maternal cousin; bilateral     Living situation: the patient lives with her husband.  Pt grew up with parents and 2 younger brothers.  Both parents were teachers.  Family history of mental health  issues:  none noted. No childhood abuse.    Sexual Orientation: Straight  Relationship Status: married for 51 years.  Name of spouse / other:Cheyenne Mcdonald If a parent, number of children / ages:pt has 2 adult children.    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   Religion/Sprituality/World View: Protestant  Any cultural differences that may affect / interfere with treatment:  not applicable   Recreation/Hobbies: reading, BSF  Stressors:  Other: concerns about her grand daughter and husband.      Strengths: Supportive Relationships, Church, Spirituality, Hopefulness, Self Advocate, and Able to Communicate Effectively  Barriers:  none   Legal History: Pending legal issue / charges: The patient has no significant history of legal issues. History of legal issue / charges:  n/a  Medical History/Surgical History: reviewed Past Medical History:  Diagnosis Date   Anemia    Arthritis    gout big toe   Breast cancer (HCC)    left breast IDC, right breast DCIS   History of radiation therapy 07/20/2020   bilateral breasts 06/01/2020-07/20/2020  Dr Antony Blackbird   Hypertension    Kidney problem    Obesity    Personal history of chemotherapy    2022 bilat lumpectomies w/rad w/chemo   Personal history of radiation therapy    2022 bilat lumpectomies w/rad w/chemo   Prediabetes    Sleep apnea    does not use CPAP   Vitamin D deficiency     Past Surgical History:  Procedure Laterality Date   ABDOMINAL HYSTERECTOMY     BREAST LUMPECTOMY Bilateral    2022 bilat lumpectomies w/rad w/chemo   BREAST LUMPECTOMY WITH RADIOACTIVE SEED AND SENTINEL LYMPH NODE BIOPSY Left 04/16/2020   Procedure: LEFT BREAST LUMPECTOMY WITH RADIOACTIVE SEED AND LEFT AXILLARY SENTINEL LYMPH NODE BIOPSY;  Surgeon: Abigail Miyamoto, MD;  Location: [facility];  Service: General;  Laterality: Left;   BREAST LUMPECTOMY WITH RADIOACTIVE SEED LOCALIZATION Right 04/16/2020   Procedure: RIGHT BREAST LUMPECTOMY WITH RADIOACTIVE SEED LOCALIZATION;  Surgeon: Abigail Miyamoto, MD;  Location: [facility];  Service: General;  Laterality: Right;   IR IMAGING GUIDED PORT INSERTION  10/18/2019   PORT-A-CATH REMOVAL Right 09/02/2020   Procedure: REMOVAL PORT-A-CATH;  Surgeon: Abigail Miyamoto, MD;  Location: [facility];  Service: General;  Laterality: Right;    Medications: Current Outpatient Medications  Medication Sig  Dispense Refill   allopurinol (ZYLOPRIM) 100 MG tablet Take 100 mg by mouth daily. 1/2 pill once daily     Cholecalciferol (VITAMIN D-3) 125 MCG (5000 UT) TABS 5,000 IU daily 30 tablet    cyanocobalamin 500 MCG TABS 300-51mcg qd     Misc Natural Products (ELDERBERRY ZINC/VIT C/IMMUNE) LOZG Use as directed in the mouth or throat.     tamoxifen (NOLVADEX) 20 MG tablet Take 1 tablet (20 mg total) by mouth daily. 90 tablet 3   No current facility-administered medications for this visit.    Allergies  Allergen Reactions   Shellfish Allergy Itching    Diagnoses:  F43.22  Plan of Care: Recommend ongoing therapy.  Pt participated in setting treatment goals.  Pt states her goals are to have a safe place to talk and to work on acceptance and to improve coping skills.   Plan to meet monthly.  Pt agrees with treatment plan.  Treatment Plan  (target date: 03/23/2024) Client Abilities/Strengths  Pt is bright, engaging and motivated for therapy.  Client Treatment Preferences  Individual therapy.  Client Statement of Needs  Improve coping skills.  Symptoms  Excessive and/or unrealistic worry that is difficult to control occurring more days than not for at least 6 months about a number of events or activities.  Problems Addressed  Anxiety Goals 1. Enhance ability to effectively cope with the full variety of life's worries and anxieties. 2. Learn and implement coping skills that result in a reduction of anxiety and worry, and improved daily functioning. Objective Learn to accept limitations in life and commit to tolerating, rather than avoiding, unpleasant emotions while accomplishing meaningful goals. Target Date: 2024-03-23 Frequency: Monthly Progress: 40 Modality: individual Related Interventions 1. Use techniques from Acceptance and Commitment Therapy to help client accept uncomfortable realities such as lack of complete control, imperfections, and uncertainty and tolerate unpleasant  emotions and thoughts in order to accomplish value-consistent goals. Objective Learn and implement problem-solving strategies for realistically addressing worries. Target Date: 2024-03-23 Frequency: Monthly Progress: 40 Modality: individual Related Interventions 1. Assign the client a homework exercise in which he/she problem-solves a current problem.  review, reinforce success, and provide corrective feedback toward improvement. 2. Teach the client problem-solving strategies involving specifically defining a problem, generating options for addressing it, evaluating the pros and cons of each option, selecting and implementing an optional action, and reevaluating and refining the action. Objective Learn and implement calming skills to reduce overall anxiety and manage anxiety symptoms. Target Date: 2024-03-23 Frequency: Monthly Progress: 40 Modality: individual Related Interventions 1. Assign the client to read about progressive muscle relaxation and other calming strategies in relevant books or treatment manuals (e.g., Progressive Relaxation Training by Robb Matar and Alen Blew; Mastery of Your Anxiety and Worry: Workbook by Earlie Counts). 2. Assign the client homework each session in which he/she practices relaxation exercises daily, gradually applying them progressively from non-anxiety-provoking to anxiety-provoking situations; review and reinforce success while providing corrective feedback toward improvement. 3. Teach the client calming/relaxation skills (e.g., applied relaxation, progressive muscle relaxation, cue controlled relaxation; mindful breathing; biofeedback) and how to discriminate better between relaxation and tension; teach the client how to apply these skills to his/her daily life. 3. Reduce overall frequency, intensity, and duration of the anxiety so that daily functioning is not impaired. 4. Resolve the core conflict that is the source of anxiety. 5. Stabilize anxiety level  while increasing ability to function on a daily basis. Diagnosis :    F43.22 Conditions For Discharge Achievement of treatment goals and objectives.     Keyshawn Hellwig, LCSW

## 2023-03-28 ENCOUNTER — Telehealth: Payer: Self-pay | Admitting: *Deleted

## 2023-03-28 ENCOUNTER — Other Ambulatory Visit: Payer: Self-pay | Admitting: *Deleted

## 2023-03-28 NOTE — Telephone Encounter (Signed)
 This RN attempted to return pt's VM x 2. Upon 2 nd attempt - this RN left VM stating need for additional information but also that an appt is being scheduled for 3/19 due to her concerns in breast changes.

## 2023-03-29 ENCOUNTER — Inpatient Hospital Stay: Admitting: Hematology and Oncology

## 2023-04-03 ENCOUNTER — Telehealth: Payer: Self-pay | Admitting: *Deleted

## 2023-04-03 ENCOUNTER — Other Ambulatory Visit: Payer: Self-pay | Admitting: *Deleted

## 2023-04-03 ENCOUNTER — Inpatient Hospital Stay: Attending: Adult Health | Admitting: Adult Health

## 2023-04-03 ENCOUNTER — Ambulatory Visit
Admission: RE | Admit: 2023-04-03 | Discharge: 2023-04-03 | Disposition: A | Discharge status: Home / Self Care | Source: Ambulatory Visit | Attending: Adult Health | Admitting: Adult Health

## 2023-04-03 ENCOUNTER — Telehealth (INDEPENDENT_AMBULATORY_CARE_PROVIDER_SITE_OTHER): Payer: Self-pay | Admitting: Family Medicine

## 2023-04-03 ENCOUNTER — Encounter: Payer: Self-pay | Admitting: Adult Health

## 2023-04-03 VITALS — BP 133/67 | HR 68 | Temp 97.4°F | Resp 18 | Ht 63.5 in | Wt 212.3 lb

## 2023-04-03 DIAGNOSIS — N644 Mastodynia: Secondary | ICD-10-CM | POA: Insufficient documentation

## 2023-04-03 DIAGNOSIS — Z1722 Progesterone receptor negative status: Secondary | ICD-10-CM | POA: Diagnosis not present

## 2023-04-03 DIAGNOSIS — D0511 Intraductal carcinoma in situ of right breast: Secondary | ICD-10-CM | POA: Insufficient documentation

## 2023-04-03 DIAGNOSIS — C50412 Malignant neoplasm of upper-outer quadrant of left female breast: Secondary | ICD-10-CM

## 2023-04-03 DIAGNOSIS — N6459 Other signs and symptoms in breast: Secondary | ICD-10-CM | POA: Diagnosis not present

## 2023-04-03 DIAGNOSIS — Z853 Personal history of malignant neoplasm of breast: Secondary | ICD-10-CM | POA: Diagnosis not present

## 2023-04-03 DIAGNOSIS — Z1732 Human epidermal growth factor receptor 2 negative status: Secondary | ICD-10-CM | POA: Diagnosis not present

## 2023-04-03 DIAGNOSIS — Z79810 Long term (current) use of selective estrogen receptor modulators (SERMs): Secondary | ICD-10-CM | POA: Diagnosis not present

## 2023-04-03 DIAGNOSIS — Z171 Estrogen receptor negative status [ER-]: Secondary | ICD-10-CM | POA: Insufficient documentation

## 2023-04-03 DIAGNOSIS — Z923 Personal history of irradiation: Secondary | ICD-10-CM | POA: Diagnosis not present

## 2023-04-03 DIAGNOSIS — Z803 Family history of malignant neoplasm of breast: Secondary | ICD-10-CM | POA: Insufficient documentation

## 2023-04-03 DIAGNOSIS — Z17421 Hormone receptor negative with human epidermal growth factor receptor 2 negative status: Secondary | ICD-10-CM | POA: Insufficient documentation

## 2023-04-03 DIAGNOSIS — Z9221 Personal history of antineoplastic chemotherapy: Secondary | ICD-10-CM | POA: Insufficient documentation

## 2023-04-03 NOTE — Telephone Encounter (Signed)
 Called to make pt aware of appt with breast center at 12:15 arrival. Pt verbalized understanding

## 2023-04-03 NOTE — Progress Notes (Signed)
 [facility] Cancer Follow up:    Norm Salt, PA [address] [place] [address]   DIAGNOSIS:  Cancer Staging  Malignant neoplasm of upper-outer quadrant of left breast in female, estrogen receptor negative (HCC) Staging form: Breast, AJCC 8th Edition - Clinical stage from 10/09/2019: Stage IB (cT1c, cN0, cM0, G3, ER-, PR-, HER2-) - Unsigned Stage prefix: Initial diagnosis Histologic grading system: 3 grade system   SUMMARY OF ONCOLOGIC HISTORY: 73 y.o. [place] woman status post left breast upper outer quadrant biopsy 09/27/2019 for a clinically T1-T2 N0, stage IA- IIA invasive ductal carcinoma, grade 3, triple negative, with an MIB-1 of 90%.   (1) genetics testing 10/30/2019 through the [facility] Common Hereditary Cancers Panel found no deleterious mutations in APC, ATM, AXIN2, BARD1, BMPR1A, BRCA1, BRCA2, BRIP1, CDH1, CDK4, CDKN2A (p14ARF), CDKN2A (p16INK4a), CHEK2, CTNNA1, DICER1, EPCAM (Deletion/duplication testing only), GREM1 (promoter region deletion/duplication testing only), KIT, MEN1, MLH1, MSH2, MSH3, MSH6, MUTYH, NBN, NF1, NHTL1, PALB2, PDGFRA, PMS2, POLD1, POLE, PTEN, RAD50, RAD51C, RAD51D, RNF43, SDHB, SDHC, SDHD, SMAD4, SMARCA4. STK11, TP53, TSC1, TSC2, and VHL.  The following genes were evaluated for sequence changes only: SDHA and HOXB13 c.251G>A variant only.   LEFT BREAST: (2) neoadjuvant chemotherapy consisting of doxorubicin and cyclophosphamide in dose dense fashion x4 starting 10/22/2019, completed 12/03/2019,followed by weekly carboplatin and paclitaxel x12 started 12/17/2019, last dose 02/25/2020             (a) echo 10/17/2019 shows an ejection fraction in the 60-65% range             (b) final 2 doses of carboplatinum/paclitaxel omitted with grade 1 neuropathy   (3) status post left lumpectomy and sentinel lymph node sampling 04/16/2020 for a ypT0 ypN0, complete pathologic response             (a) a total of 3 left axillary lymph  nodes were removed   RIGHT BREAST: (4) multicentric biopsy x2 on the right (contralateral) breast 02/27/2020 shows ductal carcinoma in situ, estrogen and progesterone receptor strongly positive   (5) right lumpectomy 04/16/2020 shows a residual 1.4 cm of ductal carcinoma in situ, grade 1, with close but negative margins   (6) adjuvant radiation to both breasts 06/01/2020 through 07/20/2020 Site Technique Total Dose (Gy) Dose per Fx (Gy) Completed Fx Beam Energies  Breast, Left: Breast_Lt 3D 50.4/50.4 1.8 28/28 10X  Breast, Left: Breast_Lt_Bst 3D 10/10 2 5/5 6X, 10X  Breast, Right: Breast_Rt 3D 50.4/50.4 1.8 28/28 10X  Breast, Right: Breast_Rt_Bst 3D 12/12 2 6/6 6X, 10X    (7) tamoxifen started 09/02/2020    CURRENT THERAPY: Tamoxifen  INTERVAL HISTORY:   Discussed the use of AI scribe software for clinical note transcription with the patient, who gave verbal consent to proceed.  Cheyenne Mcdonald 73 y.o. female  with a history of triple negative breast cancer and DCIS ER/PR positive, currently on tamoxifen, presents with concerns about changes in her breast. She noticed that one breast appears tighter or lower than before, a change she noticed recently. She is unsure if this is a new development or if she had not noticed it before. The change is located where a previous seroma was. The patient also reports tenderness in her lymph nodes, although she is unsure how to classify the sensation. She denies any recent injections, immunizations, or cat bites/scratches.   The patient is due for her pneumonia and flu shots, and is considering getting the shingles vaccine. She also mentions an interest  in the Gardant Reveal test, a liquid biopsy test for early cancer detection. The patient is planning to travel out of the country this summer and is considering the timing of her vaccinations in relation to her travel plans.  Most recent mammo 09/2022 and it showed no evidence of malignancy.   Patient  Active Problem List   Diagnosis Date Noted   Stage 3a chronic kidney disease (HCC) - elevated serum crt 05/11/2022   Elevated serum creatinine 04/28/2022   Adjustment reaction to stress, with emotional eating 04/12/2022   OSA (obstructive sleep apnea) 04/01/2022   Adjustment disorder 02/21/2022   BMI 37.0-37.9, adult-current bmi 37.9 02/10/2022   Morbid obesity (HCC)-start bmi 38.97 02/10/2022   B12 deficiency 12/08/2021   Class 2 severe obesity with serious comorbidity and body mass index (BMI) of 39.0 to 39.9 in adult [facility]) 12/08/2021   Vitamin D deficiency 07/27/2021   Primary hypertension 07/27/2021   Prediabetes 07/27/2021   Ductal carcinoma in situ (DCIS) of right breast 05/06/2020   Genetic testing 11/04/2019   Malignant neoplasm of upper-outer quadrant of left breast in female, estrogen receptor negative (HCC) 10/03/2019   Arthropathy of ankle and foot 09/24/2009   HALLUX RIGIDUS 09/24/2009    is allergic to shellfish allergy.  MEDICAL HISTORY: Past Medical History:  Diagnosis Date   Anemia    Arthritis    gout big toe   Breast cancer (HCC)    left breast IDC, right breast DCIS   History of radiation therapy 07/20/2020   bilateral breasts 06/01/2020-07/20/2020  Dr Antony Blackbird   Hypertension    Kidney problem    Obesity    Personal history of chemotherapy    2022 bilat lumpectomies w/rad w/chemo   Personal history of radiation therapy    2022 bilat lumpectomies w/rad w/chemo   Port-A-Cath in place 10/22/2019   Prediabetes    Sleep apnea    does not use CPAP   Vitamin D deficiency     SURGICAL HISTORY: Past Surgical History:  Procedure Laterality Date   ABDOMINAL HYSTERECTOMY     BREAST LUMPECTOMY Bilateral    2022 bilat lumpectomies w/rad w/chemo   BREAST LUMPECTOMY WITH RADIOACTIVE SEED AND SENTINEL LYMPH NODE BIOPSY Left 04/16/2020   Procedure: LEFT BREAST LUMPECTOMY WITH RADIOACTIVE SEED AND LEFT AXILLARY SENTINEL LYMPH NODE BIOPSY;  Surgeon: Abigail Miyamoto, MD;  Location: [facility];  Service: General;  Laterality: Left;   BREAST LUMPECTOMY WITH RADIOACTIVE SEED LOCALIZATION Right 04/16/2020   Procedure: RIGHT BREAST LUMPECTOMY WITH RADIOACTIVE SEED LOCALIZATION;  Surgeon: Abigail Miyamoto, MD;  Location: [facility];  Service: General;  Laterality: Right;   IR IMAGING GUIDED PORT INSERTION  10/18/2019   PORT-A-CATH REMOVAL Right 09/02/2020   Procedure: REMOVAL PORT-A-CATH;  Surgeon: Abigail Miyamoto, MD;  Location: [facility];  Service: General;  Laterality: Right;    SOCIAL HISTORY: Social History   Socioeconomic History   Marital status: Married    Spouse name: Not on file   Number of children: Not on file   Years of education: Not on file   Highest education level: Not on file  Occupational History   Not on file  Tobacco Use   Smoking status: Never   Smokeless tobacco: Never  Substance and Sexual Activity   Alcohol use: Never   Drug use: Never   Sexual activity: Yes    Birth control/protection: Surgical  Other Topics Concern   Not on file  Social History Narrative  Not on file   Social Drivers of Health   Financial Resource Strain: Not on file  Food Insecurity: No Food Insecurity (02/18/2022)   Hunger Vital Sign    Worried About Running Out of Food in the Last Year: Never true    Ran Out of Food in the Last Year: Never true  Transportation Needs: No Transportation Needs (02/18/2022)   PRAPARE - Administrator, Civil Service (Medical): No    Lack of Transportation (Non-Medical): No  Physical Activity: Not on file  Stress: Not on file  Social Connections: Unknown (05/21/2021)   Received from [facility]   Social Network    Social Network: Not on file  Intimate Partner Violence: Unknown (04/12/2021)   [facility], [facility]   HITS    Physically Hurt: Not on file    Insult or Talk Down To: Not on file    Threaten  Physical Harm: Not on file    Scream or Curse: Not on file    FAMILY HISTORY: Family History  Problem Relation Age of Onset   High Cholesterol Mother    Breast cancer Cousin 56       maternal cousin; bilateral     Review of Systems  Constitutional:  Negative for appetite change, chills, fatigue, fever and unexpected weight change.  HENT:   Negative for hearing loss, lump/mass and trouble swallowing.   Eyes:  Negative for eye problems and icterus.  Respiratory:  Negative for chest tightness, cough and shortness of breath.   Cardiovascular:  Negative for chest pain, leg swelling and palpitations.  Gastrointestinal:  Negative for abdominal distention, abdominal pain, constipation, diarrhea, nausea and vomiting.  Endocrine: Negative for hot flashes.  Genitourinary:  Negative for difficulty urinating.   Musculoskeletal:  Negative for arthralgias.  Skin:  Negative for itching and rash.  Neurological:  Negative for dizziness, extremity weakness, headaches and numbness.  Hematological:  Negative for adenopathy. Does not bruise/bleed easily.  Psychiatric/Behavioral:  Negative for depression. The patient is not nervous/anxious.       PHYSICAL EXAMINATION    Vitals:   04/03/23 1021  BP: 133/67  Pulse: 68  Resp: 18  Temp: (!) 97.4 F (36.3 C)  SpO2: 100%    Physical Exam Constitutional:      General: She is not in acute distress.    Appearance: Normal appearance. She is not toxic-appearing.  HENT:     Head: Normocephalic and atraumatic.     Mouth/Throat:     Mouth: Mucous membranes are moist.     Pharynx: Oropharynx is clear. No oropharyngeal exudate or posterior oropharyngeal erythema.  Eyes:     General: No scleral icterus. Cardiovascular:     Rate and Rhythm: Normal rate and regular rhythm.     Pulses: Normal pulses.     Heart sounds: Normal heart sounds.  Pulmonary:     Effort: Pulmonary effort is normal.     Breath sounds: Normal breath sounds.  Chest:      Comments: Left breast s/p lumpectomy and radiation, no sign of local recurrence right breast benign. Abdominal:     General: Abdomen is flat. Bowel sounds are normal. There is no distension.     Palpations: Abdomen is soft.     Tenderness: There is no abdominal tenderness.  Musculoskeletal:        General: No swelling.     Cervical back: Neck supple.  Lymphadenopathy:     Cervical: No cervical adenopathy.  Upper Body:     Right upper body: No supraclavicular or axillary adenopathy.     Left upper body: No supraclavicular or axillary adenopathy.  Skin:    General: Skin is warm and dry.     Findings: No rash.  Neurological:     General: No focal deficit present.     Mental Status: She is alert.  Psychiatric:        Mood and Affect: Mood normal.        Behavior: Behavior normal.     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 Approaching three years cancer-free. Discussed Guardant Reveal test for circulating tumor DNA. - Continue tamoxifen. - Discuss Guardant Reveal test with Dr. Earley Abide order based on her recommendations. - Schedule follow-up with Dr. Al Pimple in July due to travel plans.  Breast changes Reports change in left breast with tenderness in axillary lymph nodes. Further imaging recommended. - Order left breast mammogram and ultrasound.  Weight management Weight increased from 201 lbs to 212 lbs. Provided recipe book for dietary changes. - Provide recipe book for dietary guidance. - Encouraged physical activity  Vaccinations Has not received pneumonia, flu, shingles vaccinations. Prioritize pneumonia and shingles vaccines. - Administer pneumonia vaccine. - Administer first dose of shingles vaccine in April. - Administer second dose of shingles vaccine in October. - Consider  flu vaccine in September or October after returning from summer travel.  Follow-up Planning to travel to [place] in August, requests to reschedule follow-up with Dr. Al Pimple to July. - Reschedule follow-up appointment with Dr. Al Pimple to July.  RTC if needed based on imaging results, and in 07/2023 for f/u with Dr. Al Pimple   All questions were answered. The patient knows to call the clinic with any problems, questions or concerns. We can certainly see the patient much sooner if necessary.  Total encounter time:20 minutes*in face-to-face visit time, chart review, lab review, care coordination, order entry, and documentation of the encounter time.  Lillard Anes, NP 04/03/23 3:13 PM Medical Oncology and Hematology [facility] [address] [place], [place] [address] Tel. [phone]    Fax. [phone]  *Total Encounter Time as defined by the Centers for Medicare and Medicaid Services includes, in addition to the face-to-face time of a patient visit (documented in the note above) non-face-to-face time: obtaining and reviewing outside history, ordering and reviewing medications, tests or procedures, care coordination (communications with other health care professionals or caregivers) and documentation in the medical record.

## 2023-04-03 NOTE — Assessment & Plan Note (Signed)
 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 Approaching three years cancer-free. Discussed Guardant Reveal test for circulating tumor DNA. - Continue tamoxifen. - Discuss Guardant Reveal test with Dr. Earley Abide order based on her recommendations. - Schedule follow-up with Dr. Al Pimple in July due to travel plans.  Breast changes Reports change in left breast with tenderness in axillary lymph nodes. Further imaging recommended. - Order left breast mammogram and ultrasound.  Weight management Weight increased from 201 lbs to 212 lbs. Provided recipe book for dietary changes. - Provide recipe book for dietary guidance. - Encouraged physical activity  Vaccinations Has not received pneumonia, flu, shingles vaccinations. Prioritize pneumonia and shingles vaccines. - Administer pneumonia vaccine. - Administer first dose of shingles vaccine in April. - Administer second dose of shingles vaccine in October. - Consider flu vaccine in September or October after returning from summer travel.  Follow-up Planning to travel to [place] in August, requests to reschedule follow-up with Dr. Al Pimple to July. - Reschedule follow-up appointment with Dr. Al Pimple to July.  RTC if needed based on imaging results, and in 07/2023 for f/u with Dr. Al Pimple

## 2023-04-03 NOTE — Telephone Encounter (Signed)
 Good afternoon!  Sent a fax on 03/06 and wanted to confirm that it was received. If it was not, she is prepared to send another. It is to confirm that the patient is active and she needs information about the patients bone density.  Thanks!

## 2023-04-04 ENCOUNTER — Telehealth: Payer: Self-pay | Admitting: Adult Health

## 2023-04-04 NOTE — Telephone Encounter (Signed)
 Scheduled appointment per 3/24 los. Talked with the patient and she is aware of the made appointment.

## 2023-04-05 ENCOUNTER — Encounter (INDEPENDENT_AMBULATORY_CARE_PROVIDER_SITE_OTHER): Payer: Self-pay | Admitting: Family Medicine

## 2023-04-05 ENCOUNTER — Ambulatory Visit (INDEPENDENT_AMBULATORY_CARE_PROVIDER_SITE_OTHER): Payer: Medicare PPO | Admitting: Family Medicine

## 2023-04-05 VITALS — BP 138/81 | HR 68 | Temp 98.1°F | Ht 63.5 in | Wt 209.0 lb

## 2023-04-05 DIAGNOSIS — E559 Vitamin D deficiency, unspecified: Secondary | ICD-10-CM

## 2023-04-05 DIAGNOSIS — Z6836 Body mass index (BMI) 36.0-36.9, adult: Secondary | ICD-10-CM

## 2023-04-05 DIAGNOSIS — M722 Plantar fascial fibromatosis: Secondary | ICD-10-CM | POA: Diagnosis not present

## 2023-04-05 DIAGNOSIS — E538 Deficiency of other specified B group vitamins: Secondary | ICD-10-CM | POA: Diagnosis not present

## 2023-04-05 DIAGNOSIS — R7303 Prediabetes: Secondary | ICD-10-CM | POA: Diagnosis not present

## 2023-04-05 NOTE — Progress Notes (Signed)
 Cheyenne Mcdonald, D.O.  ABFM, ABOM Specializing in Clinical Bariatric Medicine  Office located at: [address] [place], [place]  [address]   Assessment and Plan:  Will review labs obtained today at next OV (A1c, BMP, A1c, Vit D, B12, Insulin, Lipid panel, and Magnesium).  No orders of the defined types were placed in this encounter.   Medications Discontinued During This Encounter  Medication Reason   allopurinol (ZYLOPRIM) 100 MG tablet      No orders of the defined types were placed in this encounter.     FOR THE DISEASE OF OBESITY:  BMI 36.0-36.9,adult - Current BMI 36.44 Morbid obesity (HCC)-start bmi 38.97/date 07/27/21 Assessment & Plan: Since last office visit on 03/01/2023, patient's muscle mass has increased by 8.6 lbs. Fat mass has decreased by 7.2 lbs. Total body water has increased by 2 lbs.  Counseling done on how various foods will affect these numbers and how to maximize success  Total lbs lost to date: 16 lbs Total weight loss percentage to date: 7.11%    Recommended Dietary Goals Cheyenne Mcdonald is currently in the action stage of change. As such, her goal is to continue weight management plan.  She has agreed to: continue current plan   Behavioral Intervention We discussed the following today: continue to work on maintaining a reduced calorie state, getting the recommended amount of protein, incorporating whole foods, making healthy choices, staying well hydrated and practicing mindfulness when eating.  Additional resources provided today: None  Evidence-based interventions for health behavior change were utilized today including the discussion of self monitoring techniques, problem-solving barriers and SMART goal setting techniques.   Regarding patient's less desirable eating habits and patterns, we employed the technique of small changes.   Pt will specifically work on: Keep physical journaling log so pt can observe progress for next visit.     Recommended Physical Activity Goals Cheyenne Mcdonald has been advised to work up to 150 minutes of moderate intensity aerobic activity a week and strengthening exercises 2-3 times per week for cardiovascular health, weight loss maintenance and preservation of muscle mass.   She has agreed to :  Continue current level of physical activity    Pharmacotherapy We both agreed to : continue with nutritional and behavioral strategies   FOR ASSOCIATED CONDITIONS ADDRESSED TODAY:  Prediabetes Assessment & Plan: Cheyenne Mcdonald is not taking any PreDM medications. Taking diet/approach. Pt reports sometimes waking up still hungry but not experiencing any cravings. Additionally, pt endorses eating the adequate amounts of protein. Stressed importance of dietary and lifestyle modifications to result in weight loss as first line txmnt. Cheyenne Mcdonald will continue to work on weight loss, exercise, via their meal plan we devised to help decrease the risk of progressing to diabetes. We will recheck A1c and fasting insulin level in approximately 3 months from last check, or as deemed appropriate.    Plantar fasciitis Assessment & Plan: Pt brings up concerns about pain in her feet, she thought it was gout but was diagnosed with plantar fasciitis. She admits to walking around barefoot at home and occasionally outside. Recommended pt always wear shoes with proper support. Suggested [place] sandals for comfortable house shoes. Will monitor closely alongside specialist.    Vitamin D deficiency Assessment & Plan: Cheyenne Mcdonald is taking Cholecalciferol 5000 units daily. She Is tolerating supplement well. Pt had a question regarding a referral for bone density. Informed pt that order for bone density should come from PCP so that the results go to her. It  is recommended to receive bone density every 2 years. Continue current supplementation regimen. Will monitor condition closely.  Follow up:   Return in about 1 month (around 05/09/2023). She was  informed of the importance of frequent follow up visits to maximize her success with intensive lifestyle modifications for her multiple health conditions.  Cheyenne Mcdonald is aware that we will review all of her lab results at our next visit together in person.  She is aware that if anything is critical/ life threatening with the results, we will be contacting her via MyChart or by my CMA will be calling them prior to the office visit to discuss acute management.     Subjective:   Chief complaint: Obesity Cheyenne Mcdonald is here to discuss her progress with her obesity treatment plan. She is on the  Category 1 Plan and keeping a food journal and adhering to recommended goals of [phone] calories and 80+ g of protein and states she is following her eating plan approximately 50% of the time. She states she is walking or doing water aerobics 60 minutes 3 days per week.  Interval History:  Cheyenne Mcdonald is here for a follow up office visit. Since last OV on 03/01/2023, Cheyenne Mcdonald is up 2 lbs. Her physical activity was hindered for a while which she attributes to the pain in her foot. Additionally, pt has been food prepping and planning.   Pharmacotherapy for weight loss: She is currently taking no anti-obesity medication.   Review of Systems:  Pertinent positives were addressed with patient today.  Reviewed by clinician on day of visit: allergies, medications, problem list, medical history, surgical history, family history, social history, and previous encounter notes.  Weight Summary and Biometrics   Weight Lost Since Last Visit: 0  Weight Gained Since Last Visit: 2lb    Vitals Temp: 98.1 F (36.7 C) BP: 138/81 Pulse Rate: 68 SpO2: 99 %   Anthropometric Measurements Height: 5' 3.5" (1.613 m) Weight: 209 lb (94.8 kg) BMI (Calculated): 36.44 Weight at Last Visit: 207lb Weight Lost Since Last Visit: 0 Weight Gained Since Last Visit: 2lb Starting Weight: 225lb Total Weight Loss (lbs): 16 lb (7.258  kg)   Body Composition  Body Fat %: 37.8 % Fat Mass (lbs): 79 lbs Muscle Mass (lbs): 123.4 lbs Total Body Water (lbs): 82 lbs Visceral Fat Rating : 13   Other Clinical Data Fasting: yes Labs: yes Today's Visit #: 25 Starting Date: 07/27/21    Objective:   PHYSICAL EXAM: Blood pressure 138/81, pulse 68, temperature 98.1 F (36.7 C), height 5' 3.5" (1.613 m), weight 209 lb (94.8 kg), SpO2 99%. Body mass index is 36.44 kg/m.  General: she is overweight, cooperative and in no acute distress. PSYCH: Has normal mood, affect and thought process.   HEENT: EOMI, sclerae are anicteric. Lungs: Normal breathing effort, no conversational dyspnea. Extremities: Moves * 4 Neurologic: A and O * 3, good insight  DIAGNOSTIC DATA REVIEWED: BMET    Component Value Date/Time   NA 141 08/08/2022 0955   K 4.1 08/08/2022 0955   CL 104 08/08/2022 0955   CO2 23 08/08/2022 0955   GLUCOSE 92 08/08/2022 0955   GLUCOSE 96 03/03/2022 1026   BUN 20 08/08/2022 0955   CREATININE 1.12 (H) 08/08/2022 0955   CREATININE 1.17 (H) 08/31/2021 1427   CALCIUM 9.3 08/08/2022 0955   GFRNONAA 47 (L) 03/03/2022 1026   GFRNONAA 50 (L) 08/31/2021 1427   GFRAA 58 (L) 10/09/2019 0806   Lab Results  Component Value Date   HGBA1C 5.9 (H) 11/22/2022   HGBA1C 5.9 (H) 07/27/2021   Lab Results  Component Value Date   INSULIN 14.6 11/22/2022   INSULIN 16.9 07/27/2021   Lab Results  Component Value Date   TSH 2.200 07/27/2021   CBC    Component Value Date/Time   WBC 4.1 08/08/2022 0955   WBC 4.4 03/03/2022 1026   RBC 4.15 08/08/2022 0955   RBC 4.20 03/03/2022 1026   HGB 12.9 08/08/2022 0955   HCT 39.0 08/08/2022 0955   PLT 151 08/08/2022 0955   MCV 94 08/08/2022 0955   MCH 31.1 08/08/2022 0955   MCH 31.9 03/03/2022 1026   MCHC 33.1 08/08/2022 0955   MCHC 34.6 03/03/2022 1026   RDW 12.7 08/08/2022 0955   Iron Studies No results found for: "IRON", "TIBC", "FERRITIN", "IRONPCTSAT" Lipid Panel      Component Value Date/Time   CHOL 135 12/08/2021 0906   TRIG 62 12/08/2021 0906   HDL 49 12/08/2021 0906   LDLCALC 73 12/08/2021 0906   Hepatic Function Panel     Component Value Date/Time   PROT 6.5 08/08/2022 0955   ALBUMIN 4.2 08/08/2022 0955   AST 17 08/08/2022 0955   AST 15 08/31/2021 1427   ALT 16 08/08/2022 0955   ALT 14 08/31/2021 1427   ALKPHOS 51 08/08/2022 0955   BILITOT 0.2 08/08/2022 0955   BILITOT 0.4 08/31/2021 1427      Component Value Date/Time   TSH 2.200 07/27/2021 1021   Nutritional Lab Results  Component Value Date   VD25OH 66.8 08/08/2022   VD25OH 62.7 04/28/2022   VD25OH 67.3 12/08/2021    Attestations:   I, Camryn Mix, acting as a Stage manager for [employer], DO., have compiled all relevant documentation for today's office visit on behalf of Thomasene Lot, DO, while in the presence of [employer], DO.  I have reviewed the above documentation for accuracy and completeness, and I agree with the above. Cheyenne Mcdonald, D.O.  The 21st Century Cures Act was signed into law in 2016 which includes the topic of electronic health records.  This provides immediate access to information in MyChart.  This includes consultation notes, operative notes, office notes, lab results and pathology reports.  If you have any questions about what you read please let us know at your next visit so we can discuss your concerns and take corrective action if need be.  We are right here with you.

## 2023-04-06 LAB — BASIC METABOLIC PANEL WITH GFR
BUN/Creatinine Ratio: 18 (ref 12–28)
BUN: 18 mg/dL (ref 8–27)
CO2: 21 mmol/L (ref 20–29)
Calcium: 9.3 mg/dL (ref 8.7–10.3)
Chloride: 105 mmol/L (ref 96–106)
Creatinine, Ser: 1.01 mg/dL — ABNORMAL HIGH (ref 0.57–1.00)
Glucose: 98 mg/dL (ref 70–99)
Potassium: 4.2 mmol/L (ref 3.5–5.2)
Sodium: 141 mmol/L (ref 134–144)
eGFR: 59 mL/min/{1.73_m2} — ABNORMAL LOW (ref >59)

## 2023-04-06 LAB — LIPID PANEL
Chol/HDL Ratio: 2.6 ratio (ref 0.0–4.4)
Cholesterol, Total: 135 mg/dL (ref 100–199)
HDL: 51 mg/dL (ref >39)
LDL Chol Calc (NIH): 67 mg/dL (ref 0–99)
Triglycerides: 92 mg/dL (ref 0–149)
VLDL Cholesterol Cal: 17 mg/dL (ref 5–40)

## 2023-04-06 LAB — INSULIN, RANDOM: INSULIN: 26 u[IU]/mL — ABNORMAL HIGH (ref 2.6–24.9)

## 2023-04-06 LAB — VITAMIN B12: Vitamin B-12: 874 pg/mL (ref 232–1245)

## 2023-04-06 LAB — VITAMIN D 25 HYDROXY (VIT D DEFICIENCY, FRACTURES): Vit D, 25-Hydroxy: 70.1 ng/mL (ref 30.0–100.0)

## 2023-04-06 LAB — MAGNESIUM: Magnesium: 1.7 mg/dL (ref 1.6–2.3)

## 2023-04-06 LAB — HEMOGLOBIN A1C
Est. average glucose Bld gHb Est-mCnc: 126 mg/dL
Hgb A1c MFr Bld: 6 % — ABNORMAL HIGH (ref 4.8–5.6)

## 2023-04-11 ENCOUNTER — Other Ambulatory Visit (INDEPENDENT_AMBULATORY_CARE_PROVIDER_SITE_OTHER): Payer: Self-pay | Admitting: Family Medicine

## 2023-04-11 ENCOUNTER — Encounter (INDEPENDENT_AMBULATORY_CARE_PROVIDER_SITE_OTHER): Payer: Self-pay | Admitting: Family Medicine

## 2023-04-11 DIAGNOSIS — E559 Vitamin D deficiency, unspecified: Secondary | ICD-10-CM

## 2023-04-11 MED ORDER — VITAMIN D3 50 MCG (2000 UT) PO CAPS: 4000.0000 [IU] | ORAL_CAPSULE | Freq: Every day | ORAL | Status: DC

## 2023-05-09 ENCOUNTER — Ambulatory Visit (INDEPENDENT_AMBULATORY_CARE_PROVIDER_SITE_OTHER): Admitting: Family Medicine

## 2023-05-23 DIAGNOSIS — I10 Essential (primary) hypertension: Secondary | ICD-10-CM | POA: Diagnosis not present

## 2023-05-23 DIAGNOSIS — N182 Chronic kidney disease, stage 2 (mild): Secondary | ICD-10-CM | POA: Diagnosis not present

## 2023-05-25 DIAGNOSIS — E669 Obesity, unspecified: Secondary | ICD-10-CM | POA: Diagnosis not present

## 2023-05-25 DIAGNOSIS — N390 Urinary tract infection, site not specified: Secondary | ICD-10-CM | POA: Diagnosis not present

## 2023-05-25 DIAGNOSIS — I10 Essential (primary) hypertension: Secondary | ICD-10-CM | POA: Diagnosis not present

## 2023-05-25 DIAGNOSIS — G4733 Obstructive sleep apnea (adult) (pediatric): Secondary | ICD-10-CM | POA: Diagnosis not present

## 2023-05-25 DIAGNOSIS — I2510 Atherosclerotic heart disease of native coronary artery without angina pectoris: Secondary | ICD-10-CM | POA: Diagnosis not present

## 2023-05-25 DIAGNOSIS — R82998 Other abnormal findings in urine: Secondary | ICD-10-CM | POA: Diagnosis not present

## 2023-05-25 DIAGNOSIS — N183 Chronic kidney disease, stage 3 unspecified: Secondary | ICD-10-CM | POA: Diagnosis not present

## 2023-05-30 ENCOUNTER — Ambulatory Visit (INDEPENDENT_AMBULATORY_CARE_PROVIDER_SITE_OTHER): Admitting: Family Medicine

## 2023-05-30 ENCOUNTER — Encounter (INDEPENDENT_AMBULATORY_CARE_PROVIDER_SITE_OTHER): Payer: Self-pay | Admitting: Family Medicine

## 2023-05-30 VITALS — BP 138/78 | HR 72 | Temp 98.7°F | Ht 63.5 in | Wt 200.0 lb

## 2023-05-30 DIAGNOSIS — E538 Deficiency of other specified B group vitamins: Secondary | ICD-10-CM

## 2023-05-30 DIAGNOSIS — R7303 Prediabetes: Secondary | ICD-10-CM

## 2023-05-30 DIAGNOSIS — E559 Vitamin D deficiency, unspecified: Secondary | ICD-10-CM | POA: Diagnosis not present

## 2023-05-30 DIAGNOSIS — Z6834 Body mass index (BMI) 34.0-34.9, adult: Secondary | ICD-10-CM

## 2023-05-30 DIAGNOSIS — N1831 Chronic kidney disease, stage 3a: Secondary | ICD-10-CM | POA: Diagnosis not present

## 2023-05-30 DIAGNOSIS — Z6836 Body mass index (BMI) 36.0-36.9, adult: Secondary | ICD-10-CM

## 2023-05-30 NOTE — Progress Notes (Signed)
 Cheyenne Mcdonald, D.O.  ABFM, ABOM Specializing in Clinical Bariatric Medicine  Office located at: [address] [place], [place]  [address]   Assessment and Plan:   FOR THE DISEASE OF OBESITY:  BMI 36.0-36.9,adult - Current BMI 34.87 Morbid obesity (HCC)-start bmi 38.97/date 07/27/21 Assessment & Plan: Since last office visit on 04/05/2023 patient's  Muscle mass has decreased by 1.2 lb. Fat mass has decreased by 7.6 lb. Total body water has decreased by 2.4 lb.  Counseling done on how various foods will affect these numbers and how to maximize success  Total lbs lost to date: 25 lbs  Total weight loss percentage to date: 11.11%    Recommended Dietary Goals [name] is currently in the action stage of change. As such, her goal is to continue weight management plan.  She has agreed to: continue current plan - pt also provided the option to follow the Low-Carb MP   Behavioral Intervention We discussed the following today: continue to work on maintaining a reduced calorie state, getting the recommended amount of protein, incorporating whole foods, making healthy choices, staying well hydrated and practicing mindfulness when eating.  Additional resources provided today: Handout on Low-Carb MP  Evidence-based interventions for health behavior change were utilized today including the discussion of self monitoring techniques, problem-solving barriers and SMART goal setting techniques.   Regarding patient's less desirable eating habits and patterns, we employed the technique of small changes.   Pt will specifically work on: n/a   Recommended Physical Activity Goals [name] has been advised to work up to 300-450 minutes of moderate intensity aerobic activity a week and strengthening exercises 2-3 times per week for cardiovascular health, weight loss maintenance and preservation of muscle mass.   She has agreed to continue to gradually increase the amount and intensity of exercise  routine   Pharmacotherapy We both agreed to continue with nutritional and behavioral strategies   ASSOCIATED CONDITIONS ADDRESSED TODAY:  Prediabetes Assessment & Plan: Most recent labs: Lab Results  Component Value Date   HGBA1C 6.0 (H) 04/05/2023   HGBA1C 5.9 (H) 11/22/2022   HGBA1C 5.7 (H) 08/08/2022   INSULIN  26.0 (H) 04/05/2023   INSULIN  14.6 11/22/2022   INSULIN  18.7 08/08/2022   Lab Results  Component Value Date   CHOL 135 04/05/2023   HDL 51 04/05/2023   LDLCALC 67 04/05/2023   TRIG 92 04/05/2023   CHOLHDL 2.6 04/05/2023   Diet/lifestyle approach. Her A1c and fasting insulin  have both worsened. Lipid panel WNL. Magnesium normal.   Encouraged pt to continue working on nutrition plan to decrease simple carbohydrates, increase lean proteins and exercise to promote weight loss and improve glycemic control and prevent progression to T2DM. Future: consider Metformin.    Stage 3a chronic kidney disease (HCC) Assessment & Plan: Most recent BMP: Lab Results  Component Value Date   CREATININE 1.01 (H) 04/05/2023   BUN 18 04/05/2023   NA 141 04/05/2023   K 4.2 04/05/2023   CL 105 04/05/2023   CO2 21 04/05/2023   Her kidney function is improving. Continue adequate hydration, exercise, controlling blood sugars/blood pressure, and avoiding nephrotoxic substances like NSAIDs to further improve kidney function. F/up with nephrology as directed .   Vitamin D  deficiency Assessment & Plan: Most recent VD: Lab Results  Component Value Date   VD25OH 70.1 04/05/2023   VD25OH 66.8 08/08/2022   VD25OH 62.7 04/28/2022   Prior to labs she was taking OTC VD 5,000 units daily. After viewing the results  she has been taking her supplement only Monday-Friday. Her VD levels are at goal. Continue regimen. Recheck in 3-4 mos.    B12 nutritional deficiency Assessment & Plan: Most recent B12: Lab Results  Component Value Date   VITAMINB12 874 04/05/2023   On OTC B12 500 mcg  daily. B12 level is 874 which is at goal of over 500. Continue regimen and nutrient rich diet.    Follow up:   Return 06/20/2023 at 3:20 PM. She was informed of the importance of frequent follow up visits to maximize her success with intensive lifestyle modifications for her multiple health conditions.  Subjective:   Chief complaint: Obesity [name] is here to discuss her progress with her obesity treatment plan. She is on the Category 1 Plan and keeping a food journal and adhering to recommended goals of [phone] calories and 80+ g of protein and states she is following her eating plan approximately 75% of the time. She states she is walking 75 minutes 5 days per week.  Interval History:  [name] is here for a follow up office visit. Since last OV on 04/05/2023, Mrs.Mercadel is down 9 lbs. Is going to [place] for 2 months in the near future and is very motivated to lose wt. States she's following more of a keto diet at the moment. Her proteins of choice: ground [place], baked chicken, fish, and occasional ribs or bacon. She gets in 10 ounces of protein daily. She selects veggies like asparagus or cabbage.   Pharmacotherapy for weight loss: none  Review of Systems:  Pertinent positives were addressed with patient today.  Reviewed by clinician on day of visit: allergies, medications, problem list, medical history, surgical history, family history, social history, and previous encounter notes.  Weight Summary and Biometrics   Weight Lost Since Last Visit: 9lb  Weight Gained Since Last Visit: 0lb   Vitals Temp: 98.7 F (37.1 C) BP: 138/78 Pulse Rate: 72 SpO2: 99 %   Anthropometric Measurements Height: 5' 3.5" (1.613 m) Weight: 200 lb (90.7 kg) BMI (Calculated): 34.87 Weight at Last Visit: 209lb Weight Lost Since Last Visit: 9lb Weight Gained Since Last Visit: 0lb Starting Weight: 225lb Total Weight Loss (lbs): 25 lb (11.3 kg)   Body Composition  Body Fat %: 35.7 % Fat Mass  (lbs): 71.4 lbs Muscle Mass (lbs): 122.2 lbs Total Body Water (lbs): 79.6 lbs Visceral Fat Rating : 12   Other Clinical Data Fasting: No Labs: no Today's Visit #: 26 Starting Date: 07/27/21   Objective:   PHYSICAL EXAM: Blood pressure 138/78, pulse 72, temperature 98.7 F (37.1 C), height 5' 3.5" (1.613 m), weight 200 lb (90.7 kg), SpO2 99%. Body mass index is 34.87 kg/m.  General: she is overweight, cooperative and in no acute distress. PSYCH: Has normal mood, affect and thought process.   HEENT: EOMI, sclerae are anicteric. Lungs: Normal breathing effort, no conversational dyspnea. Extremities: Moves * 4 Neurologic: A and O * 3, good insight  DIAGNOSTIC DATA REVIEWED: BMET    Component Value Date/Time   NA 141 04/05/2023 0939   K 4.2 04/05/2023 0939   CL 105 04/05/2023 0939   CO2 21 04/05/2023 0939   GLUCOSE 98 04/05/2023 0939   GLUCOSE 96 03/03/2022 1026   BUN 18 04/05/2023 0939   CREATININE 1.01 (H) 04/05/2023 0939   CREATININE 1.17 (H) 08/31/2021 1427   CALCIUM 9.3 04/05/2023 0939   GFRNONAA 47 (L) 03/03/2022 1026   GFRNONAA 50 (L) 08/31/2021 1427   GFRAA 58 (L)  10/09/2019 0806   Lab Results  Component Value Date   HGBA1C 6.0 (H) 04/05/2023   HGBA1C 5.9 (H) 07/27/2021   Lab Results  Component Value Date   INSULIN  26.0 (H) 04/05/2023   INSULIN  16.9 07/27/2021   Lab Results  Component Value Date   TSH 2.200 07/27/2021   CBC    Component Value Date/Time   WBC 4.1 08/08/2022 0955   WBC 4.4 03/03/2022 1026   RBC 4.15 08/08/2022 0955   RBC 4.20 03/03/2022 1026   HGB 12.9 08/08/2022 0955   HCT 39.0 08/08/2022 0955   PLT 151 08/08/2022 0955   MCV 94 08/08/2022 0955   MCH 31.1 08/08/2022 0955   MCH 31.9 03/03/2022 1026   MCHC 33.1 08/08/2022 0955   MCHC 34.6 03/03/2022 1026   RDW 12.7 08/08/2022 0955   Iron Studies No results found for: "IRON", "TIBC", "FERRITIN", "IRONPCTSAT" Lipid Panel     Component Value Date/Time   CHOL 135 04/05/2023  0939   TRIG 92 04/05/2023 0939   HDL 51 04/05/2023 0939   CHOLHDL 2.6 04/05/2023 0939   LDLCALC 67 04/05/2023 0939   Hepatic Function Panel     Component Value Date/Time   PROT 6.5 08/08/2022 0955   ALBUMIN 4.2 08/08/2022 0955   AST 17 08/08/2022 0955   AST 15 08/31/2021 1427   ALT 16 08/08/2022 0955   ALT 14 08/31/2021 1427   ALKPHOS 51 08/08/2022 0955   BILITOT 0.2 08/08/2022 0955   BILITOT 0.4 08/31/2021 1427      Component Value Date/Time   TSH 2.200 07/27/2021 1021   Nutritional Lab Results  Component Value Date   VD25OH 70.1 04/05/2023   VD25OH 66.8 08/08/2022   VD25OH 62.7 04/28/2022    Attestations:   I, Special Puri, acting as a Stage manager for [employer], DO., have compiled all relevant documentation for today's office visit on behalf of Marceil Sensor, DO, while in the presence of [employer], DO.  I have reviewed the above documentation for accuracy and completeness, and I agree with the above. Cheyenne Mcdonald, D.O.  The 21st Century Cures Act was signed into law in 2016 which includes the topic of electronic health records.  This provides immediate access to information in MyChart.  This includes consultation notes, operative notes, office notes, lab results and pathology reports.  If you have any questions about what you read please let us  know at your next visit so we can discuss your concerns and take corrective action if need be.  We are right here with you.

## 2023-06-20 ENCOUNTER — Ambulatory Visit (INDEPENDENT_AMBULATORY_CARE_PROVIDER_SITE_OTHER): Admitting: Family Medicine

## 2023-06-20 ENCOUNTER — Encounter (INDEPENDENT_AMBULATORY_CARE_PROVIDER_SITE_OTHER): Payer: Self-pay | Admitting: Family Medicine

## 2023-06-20 VITALS — BP 136/85 | HR 71 | Temp 98.1°F | Ht 63.0 in | Wt 197.0 lb

## 2023-06-20 DIAGNOSIS — E538 Deficiency of other specified B group vitamins: Secondary | ICD-10-CM | POA: Diagnosis not present

## 2023-06-20 DIAGNOSIS — Z6834 Body mass index (BMI) 34.0-34.9, adult: Secondary | ICD-10-CM

## 2023-06-20 DIAGNOSIS — R7303 Prediabetes: Secondary | ICD-10-CM

## 2023-06-20 DIAGNOSIS — E559 Vitamin D deficiency, unspecified: Secondary | ICD-10-CM

## 2023-06-20 DIAGNOSIS — Z6836 Body mass index (BMI) 36.0-36.9, adult: Secondary | ICD-10-CM

## 2023-06-20 NOTE — Progress Notes (Signed)
 Cheyenne Mcdonald, D.O.  ABFM, ABOM Specializing in Clinical Bariatric Medicine  Office located at: [address] [place], [place]  [address]   Assessment and Plan:   FOR THE DISEASE OF OBESITY:  BMI 36.0-36.9,adult - Current BMI 34.91 Morbid obesity (HCC)-start bmi 38.97/date 07/27/21 Assessment & Plan: Since last office visit on 05/30/2023 patient's  Muscle mass has decreased by 4 lb. Fat mass has increased by 1.6 lb. Total body water has increased by 6.2 lb.  Counseling done on how various foods will affect these numbers and how to maximize success  Total lbs lost to date: 28 lbs  Total weight loss percentage to date: 12.44%    Recommended Dietary Goals Cheyenne Mcdonald is currently in the action stage of change. As such, her goal is to continue weight management plan.  She has agreed to: continue current plan   Behavioral Intervention We discussed the following today: increasing lean protein intake to established goals and increasing water intake   Additional resources provided today: Handout on CAT 1 meal plan , Handout on CAT 1-2 breakfast options, Handout on CAT 1-2 lunch options, and Handout on Daily Food Journaling Log, Handout on Low-Carb meal plan.   Evidence-based interventions for health behavior change were utilized today including the discussion of self monitoring techniques, problem-solving barriers and SMART goal setting techniques.   Regarding patient's less desirable eating habits and patterns, we employed the technique of small changes.   GOAL: Pt will bring in her food tracking log to next OV.    Recommended Physical Activity Goals Cheyenne Mcdonald has been advised to work up to 300-450 minutes of moderate intensity aerobic activity a week and strengthening exercises 2-3 times per week for cardiovascular health, weight loss maintenance and preservation of muscle mass.   She has agreed to : Continue current level of physical activity    Pharmacotherapy We both agreed to  Continue with current nutritional and behavioral strategies   ASSOCIATED CONDITIONS ADDRESSED TODAY:  Prediabetes Assessment & Plan: Lab Results  Component Value Date   HGBA1C 6.0 (H) 04/05/2023   HGBA1C 5.9 (H) 11/22/2022   HGBA1C 5.7 (H) 08/08/2022   INSULIN  26.0 (H) 04/05/2023   INSULIN  14.6 11/22/2022   INSULIN  18.7 08/08/2022    Diet/lifestyle approach. Her hunger and cravings are controlled.   Encouraged pt to continue working on nutrition plan to decrease simple carbohydrates, increase lean proteins and exercise to promote weight loss and improve glycemic control and prevent progression to T2DM. Future: consider Metformin.    Vitamin D  deficiency Assessment & Plan: Lab Results  Component Value Date   VD25OH 70.1 04/05/2023   VD25OH 66.8 08/08/2022   VD25OH 62.7 04/28/2022   On OTC VD 5,000 units Monday- Friday; continue regimen. Recheck 2-3 mos.    B12 nutritional deficiency Assessment & Plan: Lab Results  Component Value Date   VITAMINB12 874 04/05/2023   Continue OTC B12 500 mcg daily. Recheck 2-3 months.    Follow up:   Return 07/11/2023 at 11:40 AM. She was informed of the importance of frequent follow up visits to maximize her success with intensive lifestyle modifications for her multiple health conditions.  Subjective:   Chief complaint: Obesity Cheyenne Mcdonald is here to discuss her progress with her obesity treatment plan. She is on the Category 1 Plan and keeping a food journal and adhering to recommended goals of [phone] calories and 80+ g of protein and states she is following her eating plan approximately 80% of the time. She states  she is walking 60+ minutes 5-7 days per week.  Interval History:  Cheyenne Mcdonald is here for a follow up office visit.   Since last OV on 05/30/2023, Cheyenne Mcdonald is down 3 lbs.   Went to [place] for 5 days and acknowledges indulging in [place].   Apart from the off-plan eating during her travel, she has been meal  planning/prepping and is protein focused.   Is going to [place] soon.   Pharmacotherapy for weight loss:  none  Review of Systems:  Pertinent positives were addressed with patient today.  Reviewed by clinician on day of visit: allergies, medications, problem list, medical history, surgical history, family history, social history, and previous encounter notes.  Weight Summary and Biometrics   Weight Lost Since Last Visit: 3lb  Weight Gained Since Last Visit: 0   Vitals Temp: 98.1 F (36.7 C) BP: 136/85 Pulse Rate: 71 SpO2: 99 %   Anthropometric Measurements Height: 5' 3 (1.6 m) Weight: 197 lb (89.4 kg) BMI (Calculated): 34.91 Weight at Last Visit: 200lb Weight Lost Since Last Visit: 3lb Weight Gained Since Last Visit: 0 Starting Weight: 225lb Total Weight Loss (lbs): 28 lb (12.7 kg)   Body Composition  Body Fat %: 37 % Fat Mass (lbs): 73 lbs Muscle Mass (lbs): 118.2 lbs Total Body Water (lbs): 85.8 lbs Visceral Fat Rating : 12   Other Clinical Data Fasting: no Labs: no Today's Visit #: 27 Starting Date: 07/27/21    Objective:   PHYSICAL EXAM: Blood pressure 136/85, pulse 71, temperature 98.1 F (36.7 C), height 5' 3 (1.6 m), weight 197 lb (89.4 kg), SpO2 99%. Body mass index is 34.9 kg/m.  General: she is overweight, cooperative and in no acute distress. PSYCH: Has normal mood, affect and thought process.   HEENT: EOMI, sclerae are anicteric. Lungs: Normal breathing effort, no conversational dyspnea. Extremities: Moves * 4 Neurologic: A and O * 3, good insight  DIAGNOSTIC DATA REVIEWED: BMET    Component Value Date/Time   NA 141 04/05/2023 0939   K 4.2 04/05/2023 0939   CL 105 04/05/2023 0939   CO2 21 04/05/2023 0939   GLUCOSE 98 04/05/2023 0939   GLUCOSE 96 03/03/2022 1026   BUN 18 04/05/2023 0939   CREATININE 1.01 (H) 04/05/2023 0939   CREATININE 1.17 (H) 08/31/2021 1427   CALCIUM 9.3 04/05/2023 0939   GFRNONAA 47 (L) 03/03/2022 1026    GFRNONAA 50 (L) 08/31/2021 1427   GFRAA 58 (L) 10/09/2019 0806   Lab Results  Component Value Date   HGBA1C 6.0 (H) 04/05/2023   HGBA1C 5.9 (H) 07/27/2021   Lab Results  Component Value Date   INSULIN  26.0 (H) 04/05/2023   INSULIN  16.9 07/27/2021   Lab Results  Component Value Date   TSH 2.200 07/27/2021   CBC    Component Value Date/Time   WBC 4.1 08/08/2022 0955   WBC 4.4 03/03/2022 1026   RBC 4.15 08/08/2022 0955   RBC 4.20 03/03/2022 1026   HGB 12.9 08/08/2022 0955   HCT 39.0 08/08/2022 0955   PLT 151 08/08/2022 0955   MCV 94 08/08/2022 0955   MCH 31.1 08/08/2022 0955   MCH 31.9 03/03/2022 1026   MCHC 33.1 08/08/2022 0955   MCHC 34.6 03/03/2022 1026   RDW 12.7 08/08/2022 0955   Iron Studies No results found for: IRON, TIBC, FERRITIN, IRONPCTSAT Lipid Panel     Component Value Date/Time   CHOL 135 04/05/2023 0939   TRIG 92 04/05/2023 0939   HDL  51 04/05/2023 0939   CHOLHDL 2.6 04/05/2023 0939   LDLCALC 67 04/05/2023 0939   Hepatic Function Panel     Component Value Date/Time   PROT 6.5 08/08/2022 0955   ALBUMIN 4.2 08/08/2022 0955   AST 17 08/08/2022 0955   AST 15 08/31/2021 1427   ALT 16 08/08/2022 0955   ALT 14 08/31/2021 1427   ALKPHOS 51 08/08/2022 0955   BILITOT 0.2 08/08/2022 0955   BILITOT 0.4 08/31/2021 1427      Component Value Date/Time   TSH 2.200 07/27/2021 1021   Nutritional Lab Results  Component Value Date   VD25OH 70.1 04/05/2023   VD25OH 66.8 08/08/2022   VD25OH 62.7 04/28/2022    Attestations:   I, Special Puri, acting as a Stage manager for [employer], DO., have compiled all relevant documentation for today's office visit on behalf of Marceil Sensor, DO, while in the presence of [employer], DO.  I have reviewed the above documentation for accuracy and completeness, and I agree with the above. Cheyenne Mcdonald, D.O.  The 21st Century Cures Act was signed into law in 2016 which includes the topic  of electronic health records.  This provides immediate access to information in MyChart.  This includes consultation notes, operative notes, office notes, lab results and pathology reports.  If you have any questions about what you read please let us  know at your next visit so we can discuss your concerns and take corrective action if need be.  We are right here with you.

## 2023-07-07 DIAGNOSIS — M25511 Pain in right shoulder: Secondary | ICD-10-CM | POA: Diagnosis not present

## 2023-07-11 ENCOUNTER — Ambulatory Visit (INDEPENDENT_AMBULATORY_CARE_PROVIDER_SITE_OTHER): Admitting: Family Medicine

## 2023-07-11 ENCOUNTER — Encounter (INDEPENDENT_AMBULATORY_CARE_PROVIDER_SITE_OTHER): Payer: Self-pay | Admitting: Family Medicine

## 2023-07-11 VITALS — BP 138/85 | HR 81 | Temp 98.7°F | Ht 63.0 in | Wt 187.0 lb

## 2023-07-11 DIAGNOSIS — R7303 Prediabetes: Secondary | ICD-10-CM | POA: Diagnosis not present

## 2023-07-11 DIAGNOSIS — E559 Vitamin D deficiency, unspecified: Secondary | ICD-10-CM | POA: Diagnosis not present

## 2023-07-11 DIAGNOSIS — E538 Deficiency of other specified B group vitamins: Secondary | ICD-10-CM

## 2023-07-11 DIAGNOSIS — Z6833 Body mass index (BMI) 33.0-33.9, adult: Secondary | ICD-10-CM

## 2023-07-11 NOTE — Progress Notes (Signed)
 Cheyenne Mcdonald Mcdonald, D.O.  ABFM, ABOM Specializing in Clinical Bariatric Medicine  Office located at: [address] [place], [place]  [address]   Assessment and Plan:    Will obtain fasting labs (A1c, insulin , BMP w GFR, Vit D, and B12) at her next OV.    FOR THE DISEASE OF OBESITY: Morbid obesity (HCC)-start bmi 38.97/date 07/27/21 BMI 33.0-33.9,adult current 33.13 Assessment & Plan: Since last office visit on 06/20/23 patient's muscle mass has decreased by 5 lbs. Fat mass has decreased by 4.6 lbs. Total body water has decreased by 4.2 lbs.  Has dropped 5% of her body fat in the last 5 months.  Counseling done on how various foods will affect these numbers and how to maximize success.   Total lbs lost to date: 38 lbs Total weight loss percentage to date: -16.89%    Recommended Dietary Goals Cheyenne Mcdonald Mcdonald is currently in the action stage of change. As such, her goal is to continue weight management plan.  She has agreed to: continue current plan   Behavioral Intervention We discussed the following today: increasing lean protein intake to established goals, decreasing simple carbohydrates , avoiding skipping meals, increasing water intake , and staying on track while traveling and vacationing  Additional resources provided today: None  Evidence-based interventions for health behavior change were utilized today including the discussion of self monitoring techniques, problem-solving barriers and SMART goal setting techniques.   Regarding patient's less desirable eating habits and patterns, we employed the technique of small changes.   Pt will specifically work on Increasing protein intake to meet daily protein goals; aim for 100 g per day.    Recommended Physical Activity Goals Cheyenne Mcdonald Mcdonald has been advised to work up to 300-450 minutes of moderate intensity aerobic activity a week and strengthening exercises 2-3 times per week for cardiovascular health, weight loss maintenance and  preservation of muscle mass.   She has agreed to: Continue current level of physical activity    Pharmacotherapy We both agreed to: Continue with current nutritional and behavioral strategies   ASSOCIATED CONDITIONS ADDRESSED TODAY:  Prediabetes Assessment & Plan: Lab Results  Component Value Date   HGBA1C 6.0 (H) 04/05/2023   HGBA1C 5.9 (H) 11/22/2022   HGBA1C 5.7 (H) 08/08/2022   INSULIN  26.0 (H) 04/05/2023   INSULIN  14.6 11/22/2022   INSULIN  18.7 08/08/2022    No meds currently; diet and lifestyle approach. No hunger/cravings. Tracking daily intake and not meeting protein goals at each meal.   Counseled pt on increasing protein intake and limiting red meat consumption. Reminded pt of benefits of protein, including how it helps to stabilize sugars. Increase water intake to half her body weight in ounces of water per day; drink extra bottle when exercising. Continue exercising. Will recheck A1c, insulin , and CMP w GFR at next OV.     Vitamin D  deficiency Assessment & Plan: Lab Results  Component Value Date   VD25OH 70.1 04/05/2023   VD25OH 66.8 08/08/2022   VD25OH 62.7 04/28/2022   Compliant with Vit D3 4,000 units daily. Tolerating well with no SE. No acute concerns reported. Reminded pt of ideal Vit D levels of 50-70. Continue with current supplementation regimen. Will recheck vit D at her next OV.     B12 nutritional deficiency Assessment & Plan: Lab Results  Component Value Date   VITAMINB12 874 04/05/2023   VITAMINB12 839 08/08/2022   VITAMINB12 860 12/08/2021   Taking OTC B12 500 mcg daily. Tolerating well, no SE. No acute  concerns. Reminded pt of ideal B12 levels of 550 or greater. Continue supplementation. Will recheck vit B12 at her next OV.     Follow up:   Return in 3 weeks (on 08/01/2023) for Keep f/u on 08/02/2023 9:20 AM with Dr. MALVA.. She was informed of the importance of frequent follow up visits to maximize her success with intensive lifestyle  modifications for her multiple health conditions.   Subjective:   Chief complaint: Obesity Cheyenne Mcdonald Mcdonald is here to discuss her progress with her obesity treatment plan. She is on the Category 1 Plan and keeping a food journal and adhering to recommended goals of [phone] calories and 80+ g of protein and states she is following her eating plan approximately 95% of the time. She states she is walking 5-6 miles 120 minutes 6 days per week.  Interval History:  Cheyenne Mcdonald Mcdonald is here for a follow up office visit. Since last OV on 06/20/23, she is down 10 lbs. She has been very dedicated and is prioritizing her health and overall well-being. She has been measuring her proteins and vegetables. She is meal prepping and planning. No hunger/cravings. She started exercise in May, doing HIIT by alternating between walking and jogging about 12 times. She also plans to go to start going to the gym and work with a Systems analyst.    Pharmacotherapy that aid with weight loss: She is currently taking no anti-obesity medication.   Review of Systems:  Pertinent positives were addressed with patient today.  Reviewed by clinician on day of visit: allergies, medications, problem list, medical history, surgical history, family history, social history, and previous encounter notes.  Weight Summary and Biometrics   Weight Lost Since Last Visit: 10lb  Weight Gained Since Last Visit: 0lb    Vitals Temp: 98.7 F (37.1 C) BP: 138/85 Pulse Rate: 81 SpO2: 99 %   Anthropometric Measurements Height: 5' 3 (1.6 m) Weight: 187 lb (84.8 kg) BMI (Calculated): 33.13 Weight at Last Visit: 197lb Weight Lost Since Last Visit: 10lb Weight Gained Since Last Visit: 0lb Starting Weight: 225lb Total Weight Loss (lbs): 38 lb (17.2 kg)   Body Composition  Body Fat %: 36.5 % Fat Mass (lbs): 68.4 lbs Muscle Mass (lbs): 113.2 lbs Total Body Water (lbs): 81.6 lbs Visceral Fat Rating : 12   Other Clinical Data Fasting:  No Labs: No Today's Visit #: 28 Starting Date: 07/27/21    Objective:   PHYSICAL EXAM: Blood pressure 138/85, pulse 81, temperature 98.7 F (37.1 C), height 5' 3 (1.6 m), weight 187 lb (84.8 kg), SpO2 99%. Body mass index is 33.13 kg/m.  General: she is overweight, cooperative and in no acute distress. PSYCH: Has normal mood, affect and thought process.   HEENT: EOMI, sclerae are anicteric. Lungs: Normal breathing effort, no conversational dyspnea. Extremities: Moves * 4 Neurologic: A and O * 3, good insight  DIAGNOSTIC DATA REVIEWED: BMET    Component Value Date/Time   NA 141 04/05/2023 0939   K 4.2 04/05/2023 0939   CL 105 04/05/2023 0939   CO2 21 04/05/2023 0939   GLUCOSE 98 04/05/2023 0939   GLUCOSE 96 03/03/2022 1026   BUN 18 04/05/2023 0939   CREATININE 1.01 (H) 04/05/2023 0939   CREATININE 1.17 (H) 08/31/2021 1427   CALCIUM 9.3 04/05/2023 0939   GFRNONAA 47 (L) 03/03/2022 1026   GFRNONAA 50 (L) 08/31/2021 1427   GFRAA 58 (L) 10/09/2019 0806   Lab Results  Component Value Date   HGBA1C 6.0 (H) 04/05/2023  HGBA1C 5.9 (H) 07/27/2021   Lab Results  Component Value Date   INSULIN  26.0 (H) 04/05/2023   INSULIN  16.9 07/27/2021   Lab Results  Component Value Date   TSH 2.200 07/27/2021   CBC    Component Value Date/Time   WBC 4.1 08/08/2022 0955   WBC 4.4 03/03/2022 1026   RBC 4.15 08/08/2022 0955   RBC 4.20 03/03/2022 1026   HGB 12.9 08/08/2022 0955   HCT 39.0 08/08/2022 0955   PLT 151 08/08/2022 0955   MCV 94 08/08/2022 0955   MCH 31.1 08/08/2022 0955   MCH 31.9 03/03/2022 1026   MCHC 33.1 08/08/2022 0955   MCHC 34.6 03/03/2022 1026   RDW 12.7 08/08/2022 0955   Iron Studies No results found for: IRON, TIBC, FERRITIN, IRONPCTSAT Lipid Panel     Component Value Date/Time   CHOL 135 04/05/2023 0939   TRIG 92 04/05/2023 0939   HDL 51 04/05/2023 0939   CHOLHDL 2.6 04/05/2023 0939   LDLCALC 67 04/05/2023 0939   Hepatic Function  Panel     Component Value Date/Time   PROT 6.5 08/08/2022 0955   ALBUMIN 4.2 08/08/2022 0955   AST 17 08/08/2022 0955   AST 15 08/31/2021 1427   ALT 16 08/08/2022 0955   ALT 14 08/31/2021 1427   ALKPHOS 51 08/08/2022 0955   BILITOT 0.2 08/08/2022 0955   BILITOT 0.4 08/31/2021 1427      Component Value Date/Time   TSH 2.200 07/27/2021 1021   Nutritional Lab Results  Component Value Date   VD25OH 70.1 04/05/2023   VD25OH 66.8 08/08/2022   VD25OH 62.7 04/28/2022    Attestations:   I, Vernell Gerlach, acting as a Stage manager for [employer], DO., have compiled all relevant documentation for today's office visit on behalf of Cheyenne Mcdonald Jenkins, DO, while in the presence of [employer], DO.  I have reviewed the above documentation for accuracy and completeness, and I agree with the above. Cheyenne Mcdonald Mcdonald, D.O.  The 21st Century Cures Act was signed into law in 2016 which includes the topic of electronic health records.  This provides immediate access to information in MyChart.  This includes consultation notes, operative notes, office notes, lab results and pathology reports.  If you have any questions about what you read please let us  know at your next visit so we can discuss your concerns and take corrective action if need be.  We are right here with you.

## 2023-07-13 DIAGNOSIS — M25611 Stiffness of right shoulder, not elsewhere classified: Secondary | ICD-10-CM | POA: Diagnosis not present

## 2023-07-13 DIAGNOSIS — S46011D Strain of muscle(s) and tendon(s) of the rotator cuff of right shoulder, subsequent encounter: Secondary | ICD-10-CM | POA: Diagnosis not present

## 2023-07-17 DIAGNOSIS — M25611 Stiffness of right shoulder, not elsewhere classified: Secondary | ICD-10-CM | POA: Diagnosis not present

## 2023-07-17 DIAGNOSIS — S46011D Strain of muscle(s) and tendon(s) of the rotator cuff of right shoulder, subsequent encounter: Secondary | ICD-10-CM | POA: Diagnosis not present

## 2023-07-20 DIAGNOSIS — M25611 Stiffness of right shoulder, not elsewhere classified: Secondary | ICD-10-CM | POA: Diagnosis not present

## 2023-07-20 DIAGNOSIS — S46011D Strain of muscle(s) and tendon(s) of the rotator cuff of right shoulder, subsequent encounter: Secondary | ICD-10-CM | POA: Diagnosis not present

## 2023-07-24 ENCOUNTER — Ambulatory Visit: Payer: Medicare PPO | Attending: Surgery

## 2023-07-24 VITALS — Wt 187.5 lb

## 2023-07-24 DIAGNOSIS — Z483 Aftercare following surgery for neoplasm: Secondary | ICD-10-CM | POA: Insufficient documentation

## 2023-07-24 NOTE — Therapy (Signed)
 OUTPATIENT PHYSICAL THERAPY [facility] SCREENING NOTE   Patient Name: Cheyenne Mcdonald MRN: [id number] DOB:[birth year], 73 y.o., female Today's Date: 07/24/2023  PCP: Rosalea Rosina SAILOR, PA REFERRING PROVIDER: Vernetta Berg, MD   PT End of Session - 07/24/23 [phone]     Visit Number 1   # unchanged due to screen only   PT Start Time 0934    PT Stop Time 0938    PT Time Calculation (min) 4 min    Activity Tolerance Patient tolerated treatment well    Behavior During Therapy [facility] for tasks assessed/performed          Past Medical History:  Diagnosis Date   Anemia    Arthritis    gout big toe   Breast cancer (HCC)    left breast IDC, right breast DCIS   History of radiation therapy 07/20/2020   bilateral breasts 06/01/2020-07/20/2020  Dr Lynwood Nasuti   Hypertension    Kidney problem    Obesity    Personal history of chemotherapy    2022 bilat lumpectomies w/rad w/chemo   Personal history of radiation therapy    2022 bilat lumpectomies w/rad w/chemo   Port-A-Cath in place 10/22/2019   Prediabetes    Sleep apnea    does not use CPAP   Vitamin D  deficiency    Past Surgical History:  Procedure Laterality Date   ABDOMINAL HYSTERECTOMY     BREAST LUMPECTOMY Bilateral    2022 bilat lumpectomies w/rad w/chemo   BREAST LUMPECTOMY WITH RADIOACTIVE SEED AND SENTINEL LYMPH NODE BIOPSY Left 04/16/2020   Procedure: LEFT BREAST LUMPECTOMY WITH RADIOACTIVE SEED AND LEFT AXILLARY SENTINEL LYMPH NODE BIOPSY;  Surgeon: Vernetta Berg, MD;  Location: [facility];  Service: General;  Laterality: Left;   BREAST LUMPECTOMY WITH RADIOACTIVE SEED LOCALIZATION Right 04/16/2020   Procedure: RIGHT BREAST LUMPECTOMY WITH RADIOACTIVE SEED LOCALIZATION;  Surgeon: Vernetta Berg, MD;  Location: [facility];  Service: General;  Laterality: Right;   IR IMAGING GUIDED PORT INSERTION  10/18/2019   PORT-A-CATH REMOVAL Right 09/02/2020   Procedure: REMOVAL PORT-A-CATH;  Surgeon:  Vernetta Berg, MD;  Location: [facility];  Service: General;  Laterality: Right;   Patient Active Problem List   Diagnosis Date Noted   Stage 3a chronic kidney disease (HCC) - elevated serum crt 05/11/2022   Elevated serum creatinine 04/28/2022   Adjustment reaction to stress, with emotional eating 04/12/2022   OSA (obstructive sleep apnea) 04/01/2022   Adjustment disorder 02/21/2022   BMI 37.0-37.9, adult-current bmi 37.9 02/10/2022   Morbid obesity (HCC)-start bmi 38.97 02/10/2022   B12 deficiency 12/08/2021   Class 2 severe obesity with serious comorbidity and body mass index (BMI) of 39.0 to 39.9 in adult [facility]) 12/08/2021   Vitamin D  deficiency 07/27/2021   Primary hypertension 07/27/2021   Prediabetes 07/27/2021   Ductal carcinoma in situ (DCIS) of right breast 05/06/2020   Genetic testing 11/04/2019   Malignant neoplasm of upper-outer quadrant of left breast in female, estrogen receptor negative (HCC) 10/03/2019   Arthropathy of ankle and foot 09/24/2009   HALLUX RIGIDUS 09/24/2009    REFERRING DIAG: left breast cancer at risk for lymphedema  THERAPY DIAG:  Aftercare following surgery for neoplasm  PERTINENT HISTORY: Patient was diagnosed on 09/30/2019 with 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 It measures 9 mm and 1.4 cm both located in the upper outer quadrant. Ki67 is 90%; Lt lumpectomy and SLNB and Rt DCIS lumpectomy  04/16/2020.   PRECAUTIONS: left UE Lymphedema risk, None  SUBJECTIVE: Pt returns for her 6 month L-Dex screen. I lost over 20 lbs! I'm going to [place] next month.  PAIN:  Are you having pain? No  SOZO SCREENING: Patient was assessed today using the SOZO machine to determine the lymphedema index score. This was compared to her baseline score. It was determined that she is within the recommended range when compared to her baseline and no further action is needed at this time. She  will continue SOZO screenings. These are done every 3 months for 2 years post operatively followed by every 6 months for 2 years, and then annually.     L-DEX FLOWSHEETS - 07/24/23 0900       L-DEX LYMPHEDEMA SCREENING   Measurement Type Unilateral    L-DEX MEASUREMENT EXTREMITY Upper Extremity    POSITION  Standing    DOMINANT SIDE Left    At Risk Side Right    BASELINE SCORE (UNILATERAL) 3.3    L-DEX SCORE (UNILATERAL) 2.5    VALUE CHANGE (UNILAT) -0.8         P: Cont 6 month L-Dex screens.    Aden Berwyn Caldron, PTA 07/24/2023, 9:38 AM

## 2023-07-25 DIAGNOSIS — S46011D Strain of muscle(s) and tendon(s) of the rotator cuff of right shoulder, subsequent encounter: Secondary | ICD-10-CM | POA: Diagnosis not present

## 2023-07-25 DIAGNOSIS — M25611 Stiffness of right shoulder, not elsewhere classified: Secondary | ICD-10-CM | POA: Diagnosis not present

## 2023-07-27 DIAGNOSIS — M25611 Stiffness of right shoulder, not elsewhere classified: Secondary | ICD-10-CM | POA: Diagnosis not present

## 2023-07-27 DIAGNOSIS — S46011D Strain of muscle(s) and tendon(s) of the rotator cuff of right shoulder, subsequent encounter: Secondary | ICD-10-CM | POA: Diagnosis not present

## 2023-07-31 ENCOUNTER — Telehealth: Payer: Self-pay

## 2023-07-31 NOTE — Telephone Encounter (Signed)
Patient has not responded

## 2023-08-01 ENCOUNTER — Ambulatory Visit (INDEPENDENT_AMBULATORY_CARE_PROVIDER_SITE_OTHER): Admitting: Family Medicine

## 2023-08-01 ENCOUNTER — Inpatient Hospital Stay

## 2023-08-01 ENCOUNTER — Inpatient Hospital Stay: Payer: Self-pay | Attending: Hematology and Oncology | Admitting: Hematology and Oncology

## 2023-08-01 VITALS — BP 122/62 | HR 61 | Temp 98.1°F | Resp 18 | Wt 185.5 lb

## 2023-08-01 DIAGNOSIS — Z171 Estrogen receptor negative status [ER-]: Secondary | ICD-10-CM | POA: Diagnosis not present

## 2023-08-01 DIAGNOSIS — N951 Menopausal and female climacteric states: Secondary | ICD-10-CM | POA: Diagnosis not present

## 2023-08-01 DIAGNOSIS — C50919 Malignant neoplasm of unspecified site of unspecified female breast: Secondary | ICD-10-CM | POA: Diagnosis not present

## 2023-08-01 DIAGNOSIS — Z9221 Personal history of antineoplastic chemotherapy: Secondary | ICD-10-CM | POA: Diagnosis not present

## 2023-08-01 DIAGNOSIS — Z923 Personal history of irradiation: Secondary | ICD-10-CM | POA: Insufficient documentation

## 2023-08-01 DIAGNOSIS — C50412 Malignant neoplasm of upper-outer quadrant of left female breast: Secondary | ICD-10-CM

## 2023-08-01 DIAGNOSIS — Z79810 Long term (current) use of selective estrogen receptor modulators (SERMs): Secondary | ICD-10-CM | POA: Insufficient documentation

## 2023-08-01 DIAGNOSIS — D0511 Intraductal carcinoma in situ of right breast: Secondary | ICD-10-CM

## 2023-08-01 LAB — CBC WITH DIFFERENTIAL/PLATELET
Abs Immature Granulocytes: 0.01 K/uL (ref 0.00–0.07)
Basophils Absolute: 0 K/uL (ref 0.0–0.1)
Basophils Relative: 1 %
Eosinophils Absolute: 0.1 K/uL (ref 0.0–0.5)
Eosinophils Relative: 1 %
HCT: 38.3 % (ref 36.0–46.0)
Hemoglobin: 12.8 g/dL (ref 12.0–15.0)
Immature Granulocytes: 0 %
Lymphocytes Relative: 27 %
Lymphs Abs: 1.1 K/uL (ref 0.7–4.0)
MCH: 31.1 pg (ref 26.0–34.0)
MCHC: 33.4 g/dL (ref 30.0–36.0)
MCV: 93 fL (ref 80.0–100.0)
Monocytes Absolute: 0.5 K/uL (ref 0.1–1.0)
Monocytes Relative: 12 %
Neutro Abs: 2.4 K/uL (ref 1.7–7.7)
Neutrophils Relative %: 59 %
Platelets: 136 K/uL — ABNORMAL LOW (ref 150–400)
RBC: 4.12 MIL/uL (ref 3.87–5.11)
RDW: 12.5 % (ref 11.5–15.5)
WBC: 4 K/uL (ref 4.0–10.5)
nRBC: 0 % (ref 0.0–0.2)

## 2023-08-01 LAB — CMP (CANCER CENTER ONLY)
ALT: 26 U/L (ref 0–44)
AST: 25 U/L (ref 15–41)
Albumin: 4.1 g/dL (ref 3.5–5.0)
Alkaline Phosphatase: 40 U/L (ref 38–126)
Anion gap: 5 (ref 5–15)
BUN: 26 mg/dL — ABNORMAL HIGH (ref 8–23)
CO2: 28 mmol/L (ref 22–32)
Calcium: 9.5 mg/dL (ref 8.9–10.3)
Chloride: 106 mmol/L (ref 98–111)
Creatinine: 1.22 mg/dL — ABNORMAL HIGH (ref 0.44–1.00)
GFR, Estimated: 47 mL/min — ABNORMAL LOW (ref >60)
Glucose, Bld: 83 mg/dL (ref 70–99)
Potassium: 3.9 mmol/L (ref 3.5–5.1)
Sodium: 139 mmol/L (ref 135–145)
Total Bilirubin: 0.5 mg/dL (ref 0.0–1.2)
Total Protein: 6.7 g/dL (ref 6.5–8.1)

## 2023-08-01 NOTE — Progress Notes (Signed)
 [facility]  Telephone:(336) [phone] Fax:([phone]) [phone]    ID: Cheyenne Mcdonald DOB: Apr 23, 1950  MR#: [id number]  RDW#:[id number]  Patient Care Team: Rosalea Rosina SAILOR, PA as PCP - General (Physician Assistant) Rutherford Gain, MD as Consulting Physician (Obstetrics and Gynecology) Glean Stephane BROCKS, RN (Inactive) as Oncology Nurse Navigator Tyree Nanetta SAILOR, RN as Oncology Nurse Navigator Vernetta Berg, MD as Consulting Physician (General Surgery) Shannon Agent, MD as Consulting Physician (Radiation Oncology) Sheryl Katz, MD as Consulting Physician (Nephrology) Loretha Ash, MD as Consulting Physician (Hematology and Oncology) Ash Loretha, MD   CHIEF COMPLAINT: 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    Discussed the use of AI scribe software for clinical note transcription with the patient, who gave verbal consent to proceed.  History of Present Illness      COVID 19 VACCINATION STATUS: Status post Pfizer x2 with booster October 2021; also had COVID July 2022   HISTORY OF CURRENT ILLNESS: From the original intake note:  Cheyenne Mcdonald presented for her routine mammography with a palpable left breast lump. She underwent bilateral diagnostic mammography with tomography and left breast ultrasonography at [facility] on 09/13/2019 showing: breast density category B; two adjacent masses in left breast at 1 o'clock, spanning approximately 3.3 cm; one indeterminate left axillary lymph node with 0.4 cm cortical bulge; no evidence right breast malignancy.  Accordingly on 09/27/2019 she proceeded to biopsy of the left breast areas in question. The pathology from this procedure ([id number]) showed: invasive ductal carcinoma, grade 3, present in both of the adjacent masses.  Prognostic indicators significant for: estrogen receptor, 0% negative and progesterone receptor, 0% negative. Proliferation marker Ki67 at 90%. HER2 equivocal by immunohistochemistry (2+), but negative by fluorescent in situ hybridization with a signals ratio 2.2 and number per cell 3.3. Additional analysis of more cells showed a signals ratio 2.05 and number per cell 3.13.  The biopsied lymph node was negative for carcinoma.  This was felt to be concordant  The patient's subsequent history is as detailed below.   PAST MEDICAL HISTORY: Past Medical History:  Diagnosis Date   Anemia    Arthritis    gout big toe   Breast cancer (HCC)    left breast IDC, right breast DCIS   History of radiation therapy 07/20/2020   bilateral breasts 06/01/2020-07/20/2020  Dr Agent Shannon   Hypertension    Kidney problem    Obesity    Personal history of chemotherapy    2022 bilat lumpectomies w/rad w/chemo   Personal history of radiation therapy    2022 bilat lumpectomies w/rad w/chemo   Port-A-Cath in place 10/22/2019   Prediabetes    Sleep apnea    does not use CPAP   Vitamin D  deficiency   heart murmur (since childhood)   PAST SURGICAL HISTORY: Past Surgical History:  Procedure Laterality Date   ABDOMINAL HYSTERECTOMY     BREAST LUMPECTOMY Bilateral    2022 bilat lumpectomies w/rad w/chemo   BREAST LUMPECTOMY WITH RADIOACTIVE SEED AND SENTINEL LYMPH NODE BIOPSY Left 04/16/2020   Procedure: LEFT BREAST LUMPECTOMY WITH RADIOACTIVE SEED AND LEFT AXILLARY SENTINEL LYMPH NODE BIOPSY;  Surgeon: Vernetta Berg, MD;  Location:  SURGERY CENTER;  Service: General;  Laterality: Left;   BREAST LUMPECTOMY WITH RADIOACTIVE SEED LOCALIZATION Right 04/16/2020   Procedure: RIGHT BREAST LUMPECTOMY WITH RADIOACTIVE  SEED LOCALIZATION;  Surgeon: Vernetta Berg, MD;  Location: [facility];  Service: General;  Laterality: Right;   IR IMAGING GUIDED PORT INSERTION  10/18/2019    PORT-A-CATH REMOVAL Right 09/02/2020   Procedure: REMOVAL PORT-A-CATH;  Surgeon: Vernetta Berg, MD;  Location: [facility];  Service: General;  Laterality: Right;    FAMILY HISTORY: Family History  Problem Relation Age of Onset   High Cholesterol Mother    Breast cancer Cousin 27       maternal cousin; bilateral    Her father died at age 79, cause of death unknown. Her mother is age 76 as of 09/2019. Cheyenne Mcdonald has two brothers (and no sisters). She reports one cousin with breast cancer at age 22.   GYNECOLOGIC HISTORY:  No LMP recorded. Patient has had a hysterectomy. Menarche: 73 years old Age at first live birth: 73 years old GX P 2 LMP 1990 Contraceptive: used for maybe 20 years HRT: never used  Hysterectomy? Yes, 1990 BSO? 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 Husband Cheyenne Mcdonald is retired Hotel manager and then retired Therapist, occupational.  At home is just the 2 of them. Daughter Cheyenne Mcdonald, age 3, works in data entry in [employer]. Son Cheyenne Mcdonald, age 44, has a college degree but works for [employer] in [employer]. Cheyenne Mcdonald has four grandchildren. She attends [employer] [address]    ADVANCED DIRECTIVES: In place   HEALTH MAINTENANCE: Social History   Tobacco Use   Smoking status: Never   Smokeless tobacco: Never  Substance Use Topics   Alcohol use: Never   Drug use: Never     Colonoscopy: 2018 (Dr. Kristie)  PAP: approx. 2013  Bone density: 2016, normal   Allergies  Allergen Reactions   Shellfish Allergy Itching    Current Outpatient Medications  Medication Sig Dispense Refill   Cholecalciferol (VITAMIN D3) 50 MCG (2000 UT) capsule Take 2 capsules (4,000 Units total) by mouth daily.     cyanocobalamin  500 MCG TABS 300-500mcg qd     Misc Natural Products (ELDERBERRY ZINC/VIT C/IMMUNE) LOZG Use as directed in the mouth or throat.     tamoxifen  (NOLVADEX ) 20 MG tablet Take 1 tablet (20 mg total) by mouth  daily. 90 tablet 3   No current facility-administered medications for this visit.    OBJECTIVE: African American woman who appears younger than stated age  There were no vitals filed for this visit.     There is no height or weight on file to calculate BMI.   Wt Readings from Last 3 Encounters:  07/24/23 187 lb 8 oz (85 kg)  07/11/23 187 lb (84.8 kg)  06/20/23 197 lb (89.4 kg)     ECOG FS:1 - Symptomatic but completely ambulatory  Physical Exam Constitutional:      General: She is not in acute distress.    Appearance: Normal appearance.  HENT:     Head: Normocephalic and atraumatic.  Chest:     Comments: Bilateral breast status post lumpectomies.  Postsurgical changes, otherwise no palpable masses or regional adenopathy. Musculoskeletal:     Cervical back: Normal range of motion and neck supple. No rigidity.  Skin:    General: Skin is warm and dry.  Neurological:     Mental Status: She is alert.       LAB RESULTS:  CMP     Component Value Date/Time   NA 141 04/05/2023 0939   K 4.2  04/05/2023 0939   CL 105 04/05/2023 0939   CO2 21 04/05/2023 0939   GLUCOSE 98 04/05/2023 0939   GLUCOSE 96 03/03/2022 1026   BUN 18 04/05/2023 0939   CREATININE 1.01 (H) 04/05/2023 0939   CREATININE 1.17 (H) 08/31/2021 1427   CALCIUM 9.3 04/05/2023 0939   PROT 6.5 08/08/2022 0955   ALBUMIN 4.2 08/08/2022 0955   AST 17 08/08/2022 0955   AST 15 08/31/2021 1427   ALT 16 08/08/2022 0955   ALT 14 08/31/2021 1427   ALKPHOS 51 08/08/2022 0955   BILITOT 0.2 08/08/2022 0955   BILITOT 0.4 08/31/2021 1427   GFRNONAA 47 (L) 03/03/2022 1026   GFRNONAA 50 (L) 08/31/2021 1427   GFRAA 58 (L) 10/09/2019 0806    No results found for: TOTALPROTELP, ALBUMINELP, A1GS, A2GS, BETS, BETA2SER, GAMS, MSPIKE, SPEI  Lab Results  Component Value Date   WBC 4.1 08/08/2022   NEUTROABS 2.4 08/08/2022   HGB 12.9 08/08/2022   HCT 39.0 08/08/2022   MCV 94 08/08/2022   PLT 151  08/08/2022    No results found for: LABCA2  No components found for: [id number]  No results for input(s): INR in the last 168 hours.  No results found for: LABCA2  No results found for: RJW800  No results found for: CAN125  No results found for: CAN153  No results found for: CA2729  No components found for: HGQUANT  No results found for: CEA1, CEA / No results found for: CEA1, CEA   No results found for: AFPTUMOR  No results found for: CHROMOGRNA  No results found for: KPAFRELGTCHN, LAMBDASER, KAPLAMBRATIO (kappa/lambda light chains)  No results found for: HGBA, HGBA2QUANT, HGBFQUANT, HGBSQUAN (Hemoglobinopathy evaluation)   No results found for: LDH  No results found for: IRON, TIBC, IRONPCTSAT (Iron and TIBC)  No results found for: FERRITIN  Urinalysis No results found for: COLORURINE, APPEARANCEUR, LABSPEC, PHURINE, GLUCOSEU, HGBUR, BILIRUBINUR, KETONESUR, PROTEINUR, UROBILINOGEN, NITRITE, LEUKOCYTESUR   STUDIES: No results found.   ELIGIBLE FOR AVAILABLE RESEARCH PROTOCOL: AET  ASSESSMENT: 73 y.o. [place] woman status post left breast upper outer quadrant biopsy 09/27/2019 for a clinically T1-T2 N0, stage IA- IIA invasive ductal carcinoma, grade 3, triple negative, with an MIB-1 of 90%.  (1) genetics testing 10/30/2019 through the [facility] Common Hereditary Cancers Panel found no deleterious mutations in APC, ATM, AXIN2, BARD1, BMPR1A, BRCA1, BRCA2, BRIP1, CDH1, CDK4, CDKN2A (p14ARF), CDKN2A (p16INK4a), CHEK2, CTNNA1, DICER1, EPCAM (Deletion/duplication testing only), GREM1 (promoter region deletion/duplication testing only), KIT, MEN1, MLH1, MSH2, MSH3, MSH6, MUTYH, NBN, NF1, NHTL1, PALB2, PDGFRA, PMS2, POLD1, POLE, PTEN, RAD50, RAD51C, RAD51D, RNF43, SDHB, SDHC, SDHD, SMAD4, SMARCA4. STK11, TP53, TSC1, TSC2, and VHL.  The following genes were evaluated for sequence changes only:  SDHA and HOXB13 c.251G>A variant only.  LEFT BREAST: (2) neoadjuvant chemotherapy consisting of doxorubicin  and cyclophosphamide  in dose dense fashion x4 starting 10/22/2019, completed 12/03/2019,followed by weekly carboplatin  and paclitaxel  x12 started 12/17/2019, last dose 02/25/2020  (a) echo 10/17/2019 shows an ejection fraction in the 60-65% range  (b) final 2 doses of carboplatinum/paclitaxel  omitted with grade 1 neuropathy  (3) status post left lumpectomy and sentinel lymph node sampling 04/16/2020 for a ypT0 ypN0, complete pathologic response  (a) a total of 3 left axillary lymph nodes were removed  RIGHT BREAST: (4) multicentric biopsy x2 on the right (contralateral) breast 02/27/2020 shows ductal carcinoma in situ, estrogen and progesterone receptor strongly positive  (5) right lumpectomy 04/16/2020 shows a residual 1.4 cm of ductal carcinoma in situ,  grade 1, with close but negative margins  (6) adjuvant radiation to both breasts 06/01/2020 through 07/20/2020 Site Technique Total Dose (Gy) Dose per Fx (Gy) Completed Fx Beam Energies  Breast, Left: Breast_Lt 3D 50.4/50.4 1.8 28/28 10X  Breast, Left: Breast_Lt_Bst 3D 10/10 2 5/5 6X, 10X  Breast, Right: Breast_Rt 3D 50.4/50.4 1.8 28/28 10X  Breast, Right: Breast_Rt_Bst 3D 12/12 2 6/6 6X, 10X   (7) tamoxifen  started 09/02/2020   PLAN:  Assessment and Plan Assessment & Plan     *Total Encounter Time as defined by the Centers for Medicare and Medicaid Services includes, in addition to the face-to-face time of a patient visit (documented in the note above) non-face-to-face time: obtaining and reviewing outside history, ordering and reviewing medications, tests or procedures, care coordination (communications with other health care professionals or caregivers) and documentation in the medical record.

## 2023-08-01 NOTE — Progress Notes (Signed)
 [facility]  [phone] [phone]    ID: Cheyenne Mcdonald DOB: [birth year]  MR#: [id number]  RDW#:[id number]  Patient Care Team: Rosalea Rosina SAILOR, PA as PCP - General (Physician Assistant) Rutherford Gain, MD as Consulting Physician (Obstetrics and Gynecology) Glean Stephane BROCKS, RN (Inactive) as Oncology Nurse Navigator Tyree Nanetta SAILOR, RN as Oncology Nurse Navigator Vernetta Berg, MD as Consulting Physician (General Surgery) Shannon Agent, MD as Consulting Physician (Radiation Oncology) Sheryl Katz, MD as Consulting Physician (Nephrology) Loretha Ash, MD as Consulting Physician (Hematology and Oncology) Ash Loretha, MD   CHIEF COMPLAINT: 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    Discussed the use of AI scribe software for clinical note transcription with the patient, who gave verbal consent to proceed.  History of Present Illness Cheyenne Mcdonald is a 73 year old female with a history of triple negative breast cancer who presents for a routine follow-up.   She has experienced a significant weight loss of approximately 30 pounds over the past year through walking five to seven miles six days a week and starting strength training with a personal trainer. She is pleased with her progress but had hoped to lose more weight before her upcoming trip to [place].  She is currently taking tamoxifen  daily for ER positive breast cancer prevention and experiences hot flashes as a side effect. She prefers not to take additional medication for the hot flashes. Her hair has not fully grown back three years post-chemotherapy, which she attributes to hereditary factors.  She had a mammogram and ultrasound in March, which were normal.  Her bowel movements and urination are  normal, and she attributes improved bowel regularity to her increased physical activity. She had a colonoscopy within the past two years with no polyps found.    Rest of the pertinent 10 point ROS reviewed and neg   COVID 19 VACCINATION STATUS: Status post Pfizer x2 with booster October 2021; also had COVID July 2022   HISTORY OF CURRENT ILLNESS: From the original intake note:  Cheyenne Mcdonald presented for her routine mammography with a palpable left breast lump. She underwent bilateral diagnostic mammography with tomography and left breast ultrasonography at [facility] on 09/13/2019 showing: breast density category B; two adjacent masses in left breast at 1 o'clock, spanning approximately 3.3 cm; one indeterminate left axillary lymph node with 0.4 cm cortical bulge; no evidence right breast malignancy.  Accordingly on 09/27/2019 she proceeded to biopsy of the left breast areas in question. The pathology from this procedure ([id number]) showed: invasive ductal carcinoma, grade 3, present in both of the adjacent masses. Prognostic indicators significant for: estrogen receptor, 0% negative and progesterone receptor, 0% negative. Proliferation marker Ki67 at 90%. HER2 equivocal by immunohistochemistry (2+), but negative by fluorescent in situ hybridization with a signals ratio 2.2 and number per cell 3.3. Additional analysis of more cells showed a signals ratio 2.05 and number per cell 3.13.  The biopsied lymph node was negative for carcinoma.  This was felt to be concordant  The patient's subsequent history is as detailed below.   PAST MEDICAL HISTORY: Past Medical History:  Diagnosis Date   Anemia    Arthritis    gout big toe   Breast cancer (HCC)    left breast IDC, right breast DCIS   History of radiation therapy 07/20/2020  bilateral breasts 06/01/2020-07/20/2020  Dr Lynwood Nasuti   Hypertension    Kidney problem    Obesity    Personal history of chemotherapy    2022 bilat  lumpectomies w/rad w/chemo   Personal history of radiation therapy    2022 bilat lumpectomies w/rad w/chemo   Port-A-Cath in place 10/22/2019   Prediabetes    Sleep apnea    does not use CPAP   Vitamin D  deficiency   heart murmur (since childhood)   PAST SURGICAL HISTORY: Past Surgical History:  Procedure Laterality Date   ABDOMINAL HYSTERECTOMY     BREAST LUMPECTOMY Bilateral    2022 bilat lumpectomies w/rad w/chemo   BREAST LUMPECTOMY WITH RADIOACTIVE SEED AND SENTINEL LYMPH NODE BIOPSY Left 04/16/2020   Procedure: LEFT BREAST LUMPECTOMY WITH RADIOACTIVE SEED AND LEFT AXILLARY SENTINEL LYMPH NODE BIOPSY;  Surgeon: Vernetta Berg, MD;  Location: [facility];  Service: General;  Laterality: Left;   BREAST LUMPECTOMY WITH RADIOACTIVE SEED LOCALIZATION Right 04/16/2020   Procedure: RIGHT BREAST LUMPECTOMY WITH RADIOACTIVE SEED LOCALIZATION;  Surgeon: Vernetta Berg, MD;  Location: [facility];  Service: General;  Laterality: Right;   IR IMAGING GUIDED PORT INSERTION  10/18/2019   PORT-A-CATH REMOVAL Right 09/02/2020   Procedure: REMOVAL PORT-A-CATH;  Surgeon: Vernetta Berg, MD;  Location:  SURGERY CENTER;  Service: General;  Laterality: Right;    FAMILY HISTORY: Family History  Problem Relation Age of Onset   High Cholesterol Mother    Breast cancer Cousin 29       maternal cousin; bilateral    Her father died at age 41, cause of death unknown. Her mother is age 70 as of 09/2019. Cheyenne Mcdonald has two brothers (and no sisters). She reports one cousin with breast cancer at age 36.   GYNECOLOGIC HISTORY:  No LMP recorded. Patient has had a hysterectomy. Menarche: 73 years old Age at first live birth: 73 years old GX P 2 LMP 1990 Contraceptive: used for maybe 20 years HRT: never used  Hysterectomy? Yes, 1990 BSO? 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 Husband Cheyenne Mcdonald is retired Hotel manager and  then retired Therapist, occupational.  At home is just the 2 of them. Daughter Cheyenne Mcdonald, age 73, works in data entry in [employer]. Son Cheyenne Mcdonald, age 74, has a college degree but works for [employer] in [employer]. Anjolie has four grandchildren. She attends [employer] [address]    ADVANCED DIRECTIVES: In place   HEALTH MAINTENANCE: Social History   Tobacco Use   Smoking status: Never   Smokeless tobacco: Never  Substance Use Topics   Alcohol use: Never   Drug use: Never     Colonoscopy: 2018 (Dr. Kristie)  PAP: approx. 2013  Bone density: 2016, normal   Allergies  Allergen Reactions   Shellfish Allergy Itching    Current Outpatient Medications  Medication Sig Dispense Refill   Cholecalciferol (VITAMIN D3) 50 MCG (2000 UT) capsule Take 2 capsules (4,000 Units total) by mouth daily.     cyanocobalamin  500 MCG TABS 300-500mcg qd     Misc Natural Products (ELDERBERRY ZINC/VIT C/IMMUNE) LOZG Use as directed in the mouth or throat.     tamoxifen  (NOLVADEX ) 20 MG tablet Take 1 tablet (20 mg total) by mouth daily. 90 tablet 3   No current facility-administered medications for this visit.    OBJECTIVE: African American woman who appears younger than stated age  There were no vitals  filed for this visit.     There is no height or weight on file to calculate BMI.   Wt Readings from Last 3 Encounters:  07/24/23 187 lb 8 oz (85 kg)  07/11/23 187 lb (84.8 kg)  06/20/23 197 lb (89.4 kg)     ECOG FS:1 - Symptomatic but completely ambulatory  Physical Exam Constitutional:      General: She is not in acute distress.    Appearance: Normal appearance.  HENT:     Head: Normocephalic and atraumatic.  Chest:     Comments: Bilateral breast status post lumpectomies.  Postsurgical changes, otherwise no palpable masses or regional adenopathy. Musculoskeletal:     Cervical back: Normal range of motion and neck supple. No rigidity.  Skin:    General: Skin is warm and dry.   Neurological:     Mental Status: She is alert.       LAB RESULTS:  CMP     Component Value Date/Time   NA 141 04/05/2023 0939   K 4.2 04/05/2023 0939   CL 105 04/05/2023 0939   CO2 21 04/05/2023 0939   GLUCOSE 98 04/05/2023 0939   GLUCOSE 96 03/03/2022 1026   BUN 18 04/05/2023 0939   CREATININE 1.01 (H) 04/05/2023 0939   CREATININE 1.17 (H) 08/31/2021 1427   CALCIUM 9.3 04/05/2023 0939   PROT 6.5 08/08/2022 0955   ALBUMIN 4.2 08/08/2022 0955   AST 17 08/08/2022 0955   AST 15 08/31/2021 1427   ALT 16 08/08/2022 0955   ALT 14 08/31/2021 1427   ALKPHOS 51 08/08/2022 0955   BILITOT 0.2 08/08/2022 0955   BILITOT 0.4 08/31/2021 1427   GFRNONAA 47 (L) 03/03/2022 1026   GFRNONAA 50 (L) 08/31/2021 1427   GFRAA 58 (L) 10/09/2019 0806    No results found for: TOTALPROTELP, ALBUMINELP, A1GS, A2GS, BETS, BETA2SER, GAMS, MSPIKE, SPEI  Lab Results  Component Value Date   WBC 4.1 08/08/2022   NEUTROABS 2.4 08/08/2022   HGB 12.9 08/08/2022   HCT 39.0 08/08/2022   MCV 94 08/08/2022   PLT 151 08/08/2022    No results found for: LABCA2  No components found for: [id number]  No results for input(s): INR in the last 168 hours.  No results found for: LABCA2  No results found for: RJW800  No results found for: CAN125  No results found for: CAN153  No results found for: CA2729  No components found for: HGQUANT  No results found for: CEA1, CEA / No results found for: CEA1, CEA   No results found for: AFPTUMOR  No results found for: CHROMOGRNA  No results found for: KPAFRELGTCHN, LAMBDASER, KAPLAMBRATIO (kappa/lambda light chains)  No results found for: HGBA, HGBA2QUANT, HGBFQUANT, HGBSQUAN (Hemoglobinopathy evaluation)   No results found for: LDH  No results found for: IRON, TIBC, IRONPCTSAT (Iron and TIBC)  No results found for: FERRITIN  Urinalysis No results found for: COLORURINE,  APPEARANCEUR, LABSPEC, PHURINE, GLUCOSEU, HGBUR, BILIRUBINUR, KETONESUR, PROTEINUR, UROBILINOGEN, NITRITE, LEUKOCYTESUR   STUDIES: No results found.   ELIGIBLE FOR AVAILABLE RESEARCH PROTOCOL: AET  ASSESSMENT: 73 y.o. [place] woman status post left breast upper outer quadrant biopsy 09/27/2019 for a clinically T1-T2 N0, stage IA- IIA invasive ductal carcinoma, grade 3, triple negative, with an MIB-1 of 90%.  (1) genetics testing 10/30/2019 through the [facility] Common Hereditary Cancers Panel found no deleterious mutations in APC, ATM, AXIN2, BARD1, BMPR1A, BRCA1, BRCA2, BRIP1, CDH1, CDK4, CDKN2A (p14ARF), CDKN2A (p16INK4a), CHEK2, CTNNA1, DICER1, EPCAM (Deletion/duplication testing only), GREM1 (  promoter region deletion/duplication testing only), KIT, MEN1, MLH1, MSH2, MSH3, MSH6, MUTYH, NBN, NF1, NHTL1, PALB2, PDGFRA, PMS2, POLD1, POLE, PTEN, RAD50, RAD51C, RAD51D, RNF43, SDHB, SDHC, SDHD, SMAD4, SMARCA4. STK11, TP53, TSC1, TSC2, and VHL.  The following genes were evaluated for sequence changes only: SDHA and HOXB13 c.251G>A variant only.  LEFT BREAST: (2) neoadjuvant chemotherapy consisting of doxorubicin  and cyclophosphamide  in dose dense fashion x4 starting 10/22/2019, completed 12/03/2019,followed by weekly carboplatin  and paclitaxel  x12 started 12/17/2019, last dose 02/25/2020  (a) echo 10/17/2019 shows an ejection fraction in the 60-65% range  (b) final 2 doses of carboplatinum/paclitaxel  omitted with grade 1 neuropathy  (3) status post left lumpectomy and sentinel lymph node sampling 04/16/2020 for a ypT0 ypN0, complete pathologic response  (a) a total of 3 left axillary lymph nodes were removed  RIGHT BREAST: (4) multicentric biopsy x2 on the right (contralateral) breast 02/27/2020 shows ductal carcinoma in situ, estrogen and progesterone receptor strongly positive  (5) right lumpectomy 04/16/2020 shows a residual 1.4 cm of ductal carcinoma in situ, grade 1,  with close but negative margins  (6) adjuvant radiation to both breasts 06/01/2020 through 07/20/2020 Site Technique Total Dose (Gy) Dose per Fx (Gy) Completed Fx Beam Energies  Breast, Left: Breast_Lt 3D 50.4/50.4 1.8 28/28 10X  Breast, Left: Breast_Lt_Bst 3D 10/10 2 5/5 6X, 10X  Breast, Right: Breast_Rt 3D 50.4/50.4 1.8 28/28 10X  Breast, Right: Breast_Rt_Bst 3D 12/12 2 6/6 6X, 10X   (7) tamoxifen  started 09/02/2020   PLAN:  Ms. Luepke is here for follow-up on tamoxifen .  She is tolerating this very well without any major side effects.  Assessment and Plan Assessment & Plan Triple negative breast cancer Currently in remission, three years post-treatment.  - Consider Guardant Reveal blood test for tumor complementary DNA upon return from travel. - Schedule bilateral diagnostic mammogram for October 2025. Ordered.  Hot flashes due to tamoxifen  Experiencing hot flashes as a side effect of tamoxifen . Prefers non-pharmacological management.  Weight loss Lost 30 pounds over the past year through increased physical activity. Weight loss beneficial for overall health and bone density.  Hair loss post-chemotherapy Hair has not fully regrown three years post-chemotherapy. Suspected hereditary factors contributing.  General Health Maintenance Up to date with colonoscopy. No polyps found in the last colonoscopy within the past two years.  Follow-up Plan for follow-up and lab tests to monitor liver function due to tamoxifen  use. - Schedule follow-up appointment in November 2025. - Perform lab tests to monitor liver function.  Total time spent: 30 minutes  *Total Encounter Time as defined by the Centers for Medicare and Medicaid Services includes, in addition to the face-to-face time of a patient visit (documented in the note above) non-face-to-face time: obtaining and reviewing outside history, ordering and reviewing medications, tests or procedures, care coordination (communications  with other health care professionals or caregivers) and documentation in the medical record.

## 2023-08-02 ENCOUNTER — Encounter (INDEPENDENT_AMBULATORY_CARE_PROVIDER_SITE_OTHER): Payer: Self-pay | Admitting: Family Medicine

## 2023-08-02 ENCOUNTER — Ambulatory Visit (INDEPENDENT_AMBULATORY_CARE_PROVIDER_SITE_OTHER): Admitting: Family Medicine

## 2023-08-02 DIAGNOSIS — E538 Deficiency of other specified B group vitamins: Secondary | ICD-10-CM | POA: Diagnosis not present

## 2023-08-02 DIAGNOSIS — R7303 Prediabetes: Secondary | ICD-10-CM | POA: Diagnosis not present

## 2023-08-02 DIAGNOSIS — E559 Vitamin D deficiency, unspecified: Secondary | ICD-10-CM | POA: Diagnosis not present

## 2023-08-02 DIAGNOSIS — N1831 Chronic kidney disease, stage 3a: Secondary | ICD-10-CM

## 2023-08-02 DIAGNOSIS — Z6831 Body mass index (BMI) 31.0-31.9, adult: Secondary | ICD-10-CM | POA: Diagnosis not present

## 2023-08-02 NOTE — Progress Notes (Signed)
 Cheyenne Mcdonald, D.O.  ABFM, ABOM Specializing in Clinical Bariatric Medicine  Office located at: [address] [place], [place]  [address]   Assessment and Plan:   Orders Placed This Encounter  Procedures   Insulin , random   Hemoglobin A1c   Basic metabolic panel with GFR   Vitamin B12   VITAMIN D  25 Hydroxy (Vit-D Deficiency, Fractures)    Labs obtained today (BMP w GFR, A1c, insulin , vit B12, and vit D) will be reviewed at next OV.    FOR THE DISEASE OF OBESITY: Morbid obesity (HCC)-start bmi 38.97/date 07/27/21 BMI 31.0-31.9,adult current 31.89 Assessment & Plan: Since last office visit on 07/11/23 patient's muscle mass has increased by 0.8 lbs. Fat mass has decreased by 7.6 lbs. Total body water has decreased by 2 lbs.  Counseling done on how various foods will affect these numbers and how to maximize success  Total lbs lost to date: 45 lbs Total weight loss percentage to date: -20.00 %   Recommended Dietary Goals Cheyenne Mcdonald is currently in the action stage of change. As such, her goal is to continue weight management plan.  She has agreed to: continue current plan   Behavioral Intervention We discussed the following today: increasing lean protein intake to established goals, continue to practice mindfulness when eating, and continue to work on maintaining a reduced calorie state, getting the recommended amount of protein, incorporating whole foods, making healthy choices, staying well hydrated and practicing mindfulness when eating.  Additional resources provided today: None  Evidence-based interventions for health behavior change were utilized today including the discussion of self monitoring techniques, problem-solving barriers and SMART goal setting techniques.   Regarding patient's less desirable eating habits and patterns, we employed the technique of small changes.   Pt will specifically work on: prioritizing protein intake while in [place]!   Recommended  Physical Activity Goals [name] has been advised to work up to 300-450 minutes of moderate intensity aerobic activity a week and strengthening exercises 2-3 times per week for cardiovascular health, weight loss maintenance and preservation of muscle mass.   She has agreed to: Continue current level of physical activity  and work on going up and down the stairs at home, to exercise her knees given her host's apt in [place] is on the 9th floor. While in [place], opt for the stairs when able.    Pharmacotherapy We both agreed to: Continue with current nutritional and behavioral strategies   ASSOCIATED CONDITIONS ADDRESSED TODAY:  Prediabetes Assessment & Plan: Lab Results  Component Value Date   HGBA1C 6.0 (H) 04/05/2023   HGBA1C 5.9 (H) 11/22/2022   HGBA1C 5.7 (H) 08/08/2022   INSULIN  26.0 (H) 04/05/2023   INSULIN  14.6 11/22/2022   INSULIN  18.7 08/08/2022    Not currently on pharmacologic treatment. Diet/lifestyle controlled. No acute concerns reported. A1c and insulin  worsened per last labs obtained ~4 months ago.   Continue increasing protein intake, decreasing simple carbs, and exercising to promote wt loss and improve glycemic control. Rechecking A1c and insulin  today. Will review results at next OV.    Vitamin D  deficiency Assessment & Plan: Lab Results  Component Value Date   VD25OH 70.1 04/05/2023   VD25OH 66.8 08/08/2022   VD25OH 62.7 04/28/2022   Pt taking 5,000 IUs daily M-F and nothing on weekends. Tolerating well with no adverse SE. No acute concerns. Continue with current supplementation. Rechecking vit D today. Will review results at next OV.    B12 nutritional deficiency Assessment & Plan:  Pt has been taking OTC B12, but is unsure of exact dosage. Tolerating well with no adverse SE. No acute concerns. Continue with supplementation. Rechecking vit B12 today. Will review results at next OV.    Stage 3a chronic kidney disease [facility]) Assessment & Plan: Labs obtained  at oncology f/u yesterday, but results are still pending. Per prior labs, her kidney function showed improvement. Pt reports adequate hydration, stating she has increased her water intake.   Continue adequate hydration, exercise, controlling blood sugars/BP, and avoiding nephrotoxic substances such as NSAIDs to further improve kidney function. Checking BMP w GFR today. Will review results at next OV.    Follow up:   Return in 8 weeks (on 09/27/2023) for follow up in mid-late September. She was informed of the importance of frequent follow up visits to maximize her success with intensive lifestyle modifications for her multiple health conditions.  Subjective:   Chief complaint: Obesity Cheyenne Mcdonald is here to discuss her progress with her obesity treatment plan.She is on the Category 1 Plan and keeping a food journal and adhering to recommended goals of [phone] calories and 80+ g of protein and states she is following her eating plan approximately 90-95% of the time. She states she is walking 2 hours 6 days per week and strength training 30 minutes 3 days per week.   Interval History:  Cheyenne Mcdonald is here for a follow up office visit. Since last OV on 07/11/23, she is down 7 lbs. She has overall been doing very well. She has tried rice cauliflower and enjoyed it. She eats 2 cans of tuna at night when she is low on her daily protein goal by dinner. Tends to eat 30 g protein for breakfast. She has not been eating any bread, but is excited to eat croissants when she is in [place]. Hunger and cravings are well controlled and she is only getting hungry at meal times. Has been meeting with her trainer who is helping with planning to do resistance band exercises while in [place].   Pharmacotherapy that aid with weight loss: She is currently taking no anti-obesity medication.   Review of Systems:  Pertinent positives were addressed with patient today.  Reviewed by clinician on day of visit: allergies,  medications, problem list, medical history, surgical history, family history, social history, and previous encounter notes.  Weight Summary and Biometrics   Weight Lost Since Last Visit: 7lb  Weight Gained Since Last Visit: 0lb    Vitals Temp: 98.7 F (37.1 C) BP: 115/69 Pulse Rate: 71 SpO2: 99 %   Anthropometric Measurements Height: 5' 3 (1.6 m) Weight: 180 lb (81.6 kg) BMI (Calculated): 31.89 Weight at Last Visit: 187lb Weight Lost Since Last Visit: 7lb Weight Gained Since Last Visit: 0lb Starting Weight: 225lb Total Weight Loss (lbs): 45 lb (20.4 kg)   Body Composition  Body Fat %: 33.6 % Fat Mass (lbs): 60.8 lbs Muscle Mass (lbs): 114 lbs Total Body Water (lbs): 79.6 lbs Visceral Fat Rating : 11   Other Clinical Data Fasting: No Labs: No Today's Visit #: 29 Starting Date: 07/27/21 Comments: low carb    Objective:   PHYSICAL EXAM: Blood pressure 115/69, pulse 71, temperature 98.7 F (37.1 C), height 5' 3 (1.6 m), weight 180 lb (81.6 kg), SpO2 99%. Body mass index is 31.89 kg/m.  General: she is overweight, cooperative and in no acute distress. PSYCH: Has normal mood, affect and thought process.   HEENT: EOMI, sclerae are anicteric. Lungs: Normal breathing effort, no  conversational dyspnea. Extremities: Moves * 4 Neurologic: A and O * 3, good insight  DIAGNOSTIC DATA REVIEWED: BMET    Component Value Date/Time   NA 139 08/01/2023 1014   NA 141 04/05/2023 0939   K 3.9 08/01/2023 1014   CL 106 08/01/2023 1014   CO2 28 08/01/2023 1014   GLUCOSE 83 08/01/2023 1014   BUN 26 (H) 08/01/2023 1014   BUN 18 04/05/2023 0939   CREATININE 1.22 (H) 08/01/2023 1014   CALCIUM 9.5 08/01/2023 1014   GFRNONAA 47 (L) 08/01/2023 1014   GFRAA 58 (L) 10/09/2019 0806   Lab Results  Component Value Date   HGBA1C 6.0 (H) 04/05/2023   HGBA1C 5.9 (H) 07/27/2021   Lab Results  Component Value Date   INSULIN  26.0 (H) 04/05/2023   INSULIN  16.9 07/27/2021    Lab Results  Component Value Date   TSH 2.200 07/27/2021   CBC    Component Value Date/Time   WBC 4.0 08/01/2023 1014   RBC 4.12 08/01/2023 1014   HGB 12.8 08/01/2023 1014   HGB 12.9 08/08/2022 0955   HCT 38.3 08/01/2023 1014   HCT 39.0 08/08/2022 0955   PLT 136 (L) 08/01/2023 1014   PLT 151 08/08/2022 0955   MCV 93.0 08/01/2023 1014   MCV 94 08/08/2022 0955   MCH 31.1 08/01/2023 1014   MCHC 33.4 08/01/2023 1014   RDW 12.5 08/01/2023 1014   RDW 12.7 08/08/2022 0955   Iron Studies No results found for: IRON, TIBC, FERRITIN, IRONPCTSAT Lipid Panel     Component Value Date/Time   CHOL 135 04/05/2023 0939   TRIG 92 04/05/2023 0939   HDL 51 04/05/2023 0939   CHOLHDL 2.6 04/05/2023 0939   LDLCALC 67 04/05/2023 0939   Hepatic Function Panel     Component Value Date/Time   PROT 6.7 08/01/2023 1014   PROT 6.5 08/08/2022 0955   ALBUMIN 4.1 08/01/2023 1014   ALBUMIN 4.2 08/08/2022 0955   AST 25 08/01/2023 1014   ALT 26 08/01/2023 1014   ALKPHOS 40 08/01/2023 1014   BILITOT 0.5 08/01/2023 1014      Component Value Date/Time   TSH 2.200 07/27/2021 1021   Nutritional Lab Results  Component Value Date   VD25OH 70.1 04/05/2023   VD25OH 66.8 08/08/2022   VD25OH 62.7 04/28/2022    Attestations:   I, Vernell Forest, acting as a Stage manager for Cheyenne Jenkins, DO., have compiled all relevant documentation for today's office visit on behalf of Cheyenne Jenkins, DO, while in the presence of [employer], DO.  I have reviewed the above documentation for accuracy and completeness, and I agree with the above. Cheyenne Mcdonald, D.O.  The 21st Century Cures Act was signed into law in 2016 which includes the topic of electronic health records.  This provides immediate access to information in MyChart.  This includes consultation notes, operative notes, office notes, lab results and pathology reports.  If you have any questions about what you read please let us   know at your next visit so we can discuss your concerns and take corrective action if need be.  We are right here with you.

## 2023-08-03 DIAGNOSIS — S46011D Strain of muscle(s) and tendon(s) of the rotator cuff of right shoulder, subsequent encounter: Secondary | ICD-10-CM | POA: Diagnosis not present

## 2023-08-03 DIAGNOSIS — M25611 Stiffness of right shoulder, not elsewhere classified: Secondary | ICD-10-CM | POA: Diagnosis not present

## 2023-08-03 LAB — BASIC METABOLIC PANEL WITH GFR
BUN/Creatinine Ratio: 22 (ref 12–28)
BUN: 26 mg/dL (ref 8–27)
CO2: 21 mmol/L (ref 20–29)
Calcium: 9.6 mg/dL (ref 8.7–10.3)
Chloride: 106 mmol/L (ref 96–106)
Creatinine, Ser: 1.18 mg/dL — ABNORMAL HIGH (ref 0.57–1.00)
Glucose: 83 mg/dL (ref 70–99)
Potassium: 4.1 mmol/L (ref 3.5–5.2)
Sodium: 141 mmol/L (ref 134–144)
eGFR: 49 mL/min/1.73 — ABNORMAL LOW (ref >59)

## 2023-08-03 LAB — VITAMIN B12: Vitamin B-12: 1787 pg/mL — ABNORMAL HIGH (ref 232–1245)

## 2023-08-03 LAB — VITAMIN D 25 HYDROXY (VIT D DEFICIENCY, FRACTURES): Vit D, 25-Hydroxy: 80.9 ng/mL (ref 30.0–100.0)

## 2023-08-03 LAB — HEMOGLOBIN A1C
Est. average glucose Bld gHb Est-mCnc: 111 mg/dL
Hgb A1c MFr Bld: 5.5 % (ref 4.8–5.6)

## 2023-08-03 LAB — INSULIN, RANDOM: INSULIN: 5.5 u[IU]/mL (ref 2.6–24.9)

## 2023-08-15 ENCOUNTER — Ambulatory Visit: Payer: Medicare HMO | Admitting: Hematology and Oncology

## 2023-10-10 ENCOUNTER — Ambulatory Visit (INDEPENDENT_AMBULATORY_CARE_PROVIDER_SITE_OTHER): Admitting: Family Medicine

## 2023-10-10 ENCOUNTER — Encounter (INDEPENDENT_AMBULATORY_CARE_PROVIDER_SITE_OTHER): Payer: Self-pay | Admitting: Family Medicine

## 2023-10-10 DIAGNOSIS — N1831 Chronic kidney disease, stage 3a: Secondary | ICD-10-CM

## 2023-10-10 DIAGNOSIS — Z6831 Body mass index (BMI) 31.0-31.9, adult: Secondary | ICD-10-CM

## 2023-10-10 DIAGNOSIS — E559 Vitamin D deficiency, unspecified: Secondary | ICD-10-CM | POA: Diagnosis not present

## 2023-10-10 DIAGNOSIS — E538 Deficiency of other specified B group vitamins: Secondary | ICD-10-CM

## 2023-10-10 DIAGNOSIS — R7303 Prediabetes: Secondary | ICD-10-CM

## 2023-10-10 MED ORDER — VITAMIN B12 500 MCG PO TABS: ORAL_TABLET | ORAL | Status: AC

## 2023-10-10 MED ORDER — VITAMIN D3 50 MCG (2000 UT) PO CAPS: ORAL_CAPSULE | ORAL | Status: DC

## 2023-10-10 NOTE — Progress Notes (Signed)
 Cheyenne Mcdonald, D.O.  ABFM, ABOM Specializing in Clinical Bariatric Medicine  Office located at: [address] [place], [place]  [address]      A) FOR THE CHRONIC DISEASE OF OBESITY:  Chief complaint: Obesity Cheyenne Mcdonald is here to discuss her progress with her obesity treatment plan.   History of present illness / Interval history:  Cheyenne Mcdonald is here today for her follow-up office visit.  Since last OV on 08/02/23, pt is the same weight. Pt just recently returned from being In [place]. She is proud that she did not gain any weight. She endorses enjoying herself and eating creme brulee. She says that walking has really helped and she got on average over 15K steps a day and can see that her clothes is fitting her looser.      08/02/23 09:00 10/10/23 08:00   Body Fat % 33.6 % 36.7 %  Muscle Mass (lbs) 114 lbs 108.2 lbs  Fat Mass (lbs) 60.8 lbs 66 lbs  Total Body Water (lbs) 79.6 lbs 77 lbs    Counseling done on how various foods will affect these numbers and how to maximize success  - Lost muscle and gained weight  - between 30 and 34 % body fat is goal  - lost 1/3 body fat   Total lbs lost to date: - 40 lbs Total Fat Mass in lbs lost to date: - 29.8 lbs Total weight loss percentage to date: - 18.18 %    Morbid obesity (HCC)-start bmi 38.97/date 07/27/21 BMI 31.0-31.9,adult current BMI 31.89  Nutrition Therapy She is on the Category 1 Plan and keeping a food journal and adhering to recommended goals of [phone] calories and 80+ g of protein and states she is not really following her meal plan since she had been on vacation but trit to eat more fruits,veggies and lean proteins.  - Tracking Calories/Macros: no   - Eating More Whole Foods: yes  - Adequate Protein Intake: no   - Adequate Water Intake: no   - Skipping Meals: yes  - Sleeping 7-9 Hours/ Night: no   [name] is currently in the action stage of change. As such, her goal is to continue weight management  plan.  She has agreed to: Switch to 950 Calories with 75 +++ g lean protein    Physical Activity Pt is walking 90 minutes 7 days per week   [name] has been advised to work up to 300-450 minutes of moderate intensity aerobic activity a week and strengthening exercises 2-3 times per week for cardiovascular health, weight loss maintenance and preservation of muscle mass.  She has agreed to : Increase physical activity in their day and reduce sedentary time (increase NEAT)., Increase and monitor steps for a goal of 10,000 per day, Increase volume of physical activity to a goal of 240 minutes a week, and Combine aerobic and strengthening exercises for efficiency and improved cardiometabolic health.   Behavioral Modifications Evidence-based interventions for health behavior change were utilized today including the discussion of  1) self monitoring techniques:    - Stay disciplined   - Get protein back in   2) problem-solving barriers:    - Continue walking  - eat smaller meals throughout the da y  3) self care:    4) SMART goals for next OV:    - stay between 15k and 20k steps   Regarding patient's less desirable eating habits and patterns, we employed the technique of small changes.   We  discussed the following today: increasing lean protein intake to established goals, work on tracking and journaling calories using tracking application, and continue to work on implementation of reduced calorie nutritional plan, non scale victories, graze throughout the day   Additional resources provided today: Handout on Daily Food Journaling Log   Medical Interventions/ Pharmacotherapy Previous Bariatric surgery: n/a Pharmacotherapy for weight loss: She is not currently taking medications  for medical weight loss.    We discussed various medication options to help Asiyah with her weight loss efforts and we both agreed to : Continue with current nutritional and behavioral strategies   B) OBESITY  RELATED CONDITIONS ADDRESSED TODAY:  Prediabetes Assessment & Plan Lab Results  Component Value Date   HGBA1C 5.5 08/02/2023   HGBA1C 6.0 (H) 04/05/2023   HGBA1C 5.9 (H) 11/22/2022   INSULIN  5.5 08/02/2023   INSULIN  26.0 (H) 04/05/2023   INSULIN  14.6 11/22/2022   Reviewed labs today. Answered any pertinent questions. Insulin  is the lowest it has been at 5.5. Diet and lifestyle well controlled. Continue decreasing body fat and increasing lean protein intake.    Vitamin D  deficiency Assessment & Plan Lab Results  Component Value Date   VD25OH 80.9 08/02/2023   VD25OH 70.1 04/05/2023   VD25OH 66.8 08/08/2022   Reviewed labs today. Pt Vit D is at goal and a little above. Pt was taking OTC Vit D 5000 lU daily. With good compliance and tolerance. Since pt was above goal decrease supplementation to 2000 lU daily. Discussed with pt as she sheds adipose tissue and fat her Vit D levels will continue to increase.     Stage 3a chronic kidney disease [facility]) Assessment & Plan Lab Results  Component Value Date   CREATININE 1.18 (H) 08/02/2023   BUN 26 08/02/2023   NA 141 08/02/2023   K 4.1 08/02/2023   CL 106 08/02/2023   CO2 21 08/02/2023      Component Value Date/Time   PROT 6.7 08/01/2023 1014   PROT 6.5 08/08/2022 0955   ALBUMIN 4.1 08/01/2023 1014   ALBUMIN 4.2 08/08/2022 0955   AST 25 08/01/2023 1014   ALT 26 08/01/2023 1014   ALKPHOS 40 08/01/2023 1014   BILITOT 0.5 08/01/2023 1014    Reviewed labs. BUN is a little increased but the rest of her panel shows consistency and little to no change. Continue adequate hydration, increasing exercise as tolerated and avoiding NSAID to further improve kidney functions.     B12 nutritional deficiency Assessment & Plan Lab Results  Component Value Date   VITAMINB12 1,787 (H) 08/02/2023   Pt has been taking OTC B12 supplements unsure of dosage. Labs reviewed today show that her B12 is above goal. Decrease supplementation to  every other day.     Medications Discontinued During This Encounter  Medication Reason   cyanocobalamin  500 MCG TABS    Cholecalciferol (VITAMIN D3) 50 MCG (2000 UT) capsule Reorder     Meds ordered this encounter  Medications   Cyanocobalamin  (VITAMIN B12) 500 MCG TABS    Sig: 300-573mcg take every other day   Cholecalciferol (VITAMIN D3) 50 MCG (2000 UT) capsule    Sig: Cut dose in half and take 2000 IU daily      Follow up:   Return 11/08/2023 at 9:20 AM   She was informed of the importance of frequent follow up visits to maximize her success with intensive lifestyle modifications for her multiple health conditions.   Weight Summary and Biometrics   Weight  Lost Since Last Visit: 0lb  Weight Gained Since Last Visit: 0lb    Vitals Temp: 98.1 F (36.7 C) BP: 125/73 Pulse Rate: (!) 57 SpO2: 98 %   Anthropometric Measurements Height: 5' 3 (1.6 m) Weight: 180 lb (81.6 kg) BMI (Calculated): 31.89 Weight at Last Visit: 180lb Weight Lost Since Last Visit: 0lb Weight Gained Since Last Visit: 0lb Starting Weight: 220lb Total Weight Loss (lbs): 45 lb (20.4 kg)   Body Composition  Body Fat %: 36.7 % Fat Mass (lbs): 66 lbs Muscle Mass (lbs): 108.2 lbs Total Body Water (lbs): 77 lbs Visceral Fat Rating : 12   Other Clinical Data Fasting: yes Labs: no Today's Visit #: 30 Starting Date: 07/27/21    Objective:   PHYSICAL EXAM: Blood pressure 125/73, pulse (!) 57, temperature 98.1 F (36.7 C), height 5' 3 (1.6 m), weight 180 lb (81.6 kg), SpO2 98%. Body mass index is 31.89 kg/m.  General: she is overweight, cooperative and in no acute distress. PSYCH: Has normal mood, affect and thought process.   HEENT: EOMI, sclerae are anicteric. Lungs: Normal breathing effort, no conversational dyspnea. Extremities: Moves * 4 Neurologic: A and O * 3, good insight  DIAGNOSTIC DATA REVIEWED: BMET    Component Value Date/Time   NA 141 08/02/2023 1013   K 4.1  08/02/2023 1013   CL 106 08/02/2023 1013   CO2 21 08/02/2023 1013   GLUCOSE 83 08/02/2023 1013   GLUCOSE 83 08/01/2023 1014   BUN 26 08/02/2023 1013   CREATININE 1.18 (H) 08/02/2023 1013   CREATININE 1.22 (H) 08/01/2023 1014   CALCIUM 9.6 08/02/2023 1013   GFRNONAA 47 (L) 08/01/2023 1014   GFRAA 58 (L) 10/09/2019 0806   Lab Results  Component Value Date   HGBA1C 5.5 08/02/2023   HGBA1C 5.9 (H) 07/27/2021   Lab Results  Component Value Date   INSULIN  5.5 08/02/2023   INSULIN  16.9 07/27/2021   Lab Results  Component Value Date   TSH 2.200 07/27/2021   CBC    Component Value Date/Time   WBC 4.0 08/01/2023 1014   RBC 4.12 08/01/2023 1014   HGB 12.8 08/01/2023 1014   HGB 12.9 08/08/2022 0955   HCT 38.3 08/01/2023 1014   HCT 39.0 08/08/2022 0955   PLT 136 (L) 08/01/2023 1014   PLT 151 08/08/2022 0955   MCV 93.0 08/01/2023 1014   MCV 94 08/08/2022 0955   MCH 31.1 08/01/2023 1014   MCHC 33.4 08/01/2023 1014   RDW 12.5 08/01/2023 1014   RDW 12.7 08/08/2022 0955   Iron Studies No results found for: IRON, TIBC, FERRITIN, IRONPCTSAT Lipid Panel     Component Value Date/Time   CHOL 135 04/05/2023 0939   TRIG 92 04/05/2023 0939   HDL 51 04/05/2023 0939   CHOLHDL 2.6 04/05/2023 0939   LDLCALC 67 04/05/2023 0939   Hepatic Function Panel     Component Value Date/Time   PROT 6.7 08/01/2023 1014   PROT 6.5 08/08/2022 0955   ALBUMIN 4.1 08/01/2023 1014   ALBUMIN 4.2 08/08/2022 0955   AST 25 08/01/2023 1014   ALT 26 08/01/2023 1014   ALKPHOS 40 08/01/2023 1014   BILITOT 0.5 08/01/2023 1014      Component Value Date/Time   TSH 2.200 07/27/2021 1021   Nutritional Lab Results  Component Value Date   VD25OH 80.9 08/02/2023   VD25OH 70.1 04/05/2023   VD25OH 66.8 08/08/2022    Attestations:   I, Sonny Laroche, acting as a Stage manager for  Cheyenne Jenkins, DO., have compiled all relevant documentation for today's office visit on behalf of Cheyenne Jenkins, DO, while in the presence of Cheyenne Jenkins, DO.    I have reviewed the above documentation for accuracy and completeness, and I agree with the above. Cheyenne JINNY Mcdonald, D.O.  The 21st Century Cures Act was signed into law in 2016 which includes the topic of electronic health records.  This provides immediate access to information in MyChart.  This includes consultation notes, operative notes, office notes, lab results and pathology reports.  If you have any questions about what you read please let us  know at your next visit so we can discuss your concerns and take corrective action if need be.  We are right here with you.

## 2023-10-23 ENCOUNTER — Ambulatory Visit: Admitting: Psychology

## 2023-10-30 ENCOUNTER — Ambulatory Visit: Admitting: Psychology

## 2023-10-30 DIAGNOSIS — F4322 Adjustment disorder with anxiety: Secondary | ICD-10-CM

## 2023-10-30 NOTE — Progress Notes (Signed)
 [facility] Counselor/Therapist Progress Note  Patient ID: Cheyenne Mcdonald, MRN: [id number],    Date: 10/30/2023  Time Spent: 3:00pm-3:55pm  55 minutes   Treatment Type: Individual Therapy  Reported Symptoms: stress  Mental Status Exam: Appearance:  Casual     Behavior: Appropriate  Motor: Normal  Speech/Language:  Normal Rate  Affect: Appropriate  Mood: normal  Thought process: normal  Thought content:   WNL  Sensory/Perceptual disturbances:   WNL  Orientation: oriented to person, place, time/date, and situation  Attention: Good  Concentration: Good  Memory: WNL  Fund of knowledge:  Good  Insight:   Good  Judgment:  Good  Impulse Control: Good   Risk Assessment: Danger to Self:  No Self-injurious Behavior: No Danger to Others: No Duty to Warn:no Physical Aggression / Violence:No  Access to Firearms a concern: No  Gang Involvement:No   Subjective: Pt present for face-to-face individual therapy via video.  Pt consents to telehealth video session and is aware of limitations and benefits of virtual sessions. Location of pt: home Location of therapist: home office.   Pt talked about her trip to [place].  She was there for 2 months and returned home the end of September.  Pt states the trip was wonderful and she got to do all she hoped to do.  She took a [place] class for half a day each day and then toured [place].   Pt states her [place] improved dramatically.   Since being back home pt's friend passed away.  They were in the same cancer survivor group.  Pt's friend's cancer came back after 9 years of being cancer free.  Addressed how this affects pt.   Pt has been cancer free for 3 years.   Helped pt process her feelings and grief.  Pt states her husband did ok while she was in [place] for 2 months.  He told her when she came home that he may be interested in traveling with her somewhere and he renewed his passport.  This is progress since her husband usually does not  want to go anywhere.  Pt talked about her weight.  She met her goal of not gaining weight while in [place].  She walked a lot every day.  Since being home pt has kept a schedule of walking 5-7 miles a day and doing weight training 3 days a week.   Pt states she is realizing that nobody can make her happy but herself.   She has released some church responsibilities that do not serve her well and she is taking better care of herself.  She is working on worrying less and focusing on gratitude.   Pt is starting to think about her goals for 2026 bc she realizes the planning is part of the excitement for her. Provided supportive therapy.   Interventions: Cognitive Behavioral Therapy and Insight-Oriented  Diagnosis: F43.22  Plan of Care: Recommend ongoing therapy.  Pt participated in setting treatment goals.  Pt states her goals are to have a safe place to talk and to work on acceptance and to improve coping skills.   Plan to meet monthly.  Pt agrees with treatment plan.   Treatment Plan  (target date: 03/23/2024) Client Abilities/Strengths  Pt is bright, engaging and motivated for therapy.  Client Treatment Preferences  Individual therapy.  Client Statement of Needs  Improve coping skills.  Symptoms  Excessive and/or unrealistic worry that is difficult to control occurring more days than not for at least 6 months  about a number of events or activities.  Problems Addressed  Anxiety Goals 1. Enhance ability to effectively cope with the full variety of life's worries and anxieties. 2. Learn and implement coping skills that result in a reduction of anxiety and worry, and improved daily functioning. Objective Learn to accept limitations in life and commit to tolerating, rather than avoiding, unpleasant emotions while accomplishing meaningful goals. Target Date: 2024-03-23 Frequency: Monthly Progress: 40 Modality: individual Related Interventions 1. Use techniques from Acceptance and Commitment  Therapy to help client accept uncomfortable realities such as lack of complete control, imperfections, and uncertainty and tolerate unpleasant emotions and thoughts in order to accomplish value-consistent goals. Objective Learn and implement problem-solving strategies for realistically addressing worries. Target Date: 2024-03-23 Frequency: Monthly Progress: 40 Modality: individual Related Interventions 1. Assign the client a homework exercise in which he/she problem-solves a current problem.  review, reinforce success, and provide corrective feedback toward improvement. 2. Teach the client problem-solving strategies involving specifically defining a problem, generating options for addressing it, evaluating the pros and cons of each option, selecting and implementing an optional action, and reevaluating and refining the action. Objective Learn and implement calming skills to reduce overall anxiety and manage anxiety symptoms. Target Date: 2024-03-23 Frequency: Monthly Progress: 40 Modality: individual Related Interventions 1. Assign the client to read about progressive muscle relaxation and other calming strategies in relevant books or treatment manuals (e.g., Progressive Relaxation Training by Thornell and Elmer; Mastery of Your Anxiety and Worry: Workbook by Richarda armin Given). 2. Assign the client homework each session in which he/she practices relaxation exercises daily, gradually applying them progressively from non-anxiety-provoking to anxiety-provoking situations; review and reinforce success while providing corrective feedback toward improvement. 3. Teach the client calming/relaxation skills (e.g., applied relaxation, progressive muscle relaxation, cue controlled relaxation; mindful breathing; biofeedback) and how to discriminate better between relaxation and tension; teach the client how to apply these skills to his/her daily life. 3. Reduce overall frequency, intensity, and duration of  the anxiety so that daily functioning is not impaired. 4. Resolve the core conflict that is the source of anxiety. 5. Stabilize anxiety level while increasing ability to function on a daily basis. Diagnosis :    F43.22 Conditions For Discharge Achievement of treatment goals and objectives.  Nyela Cortinas, LCSW

## 2023-11-08 ENCOUNTER — Ambulatory Visit (INDEPENDENT_AMBULATORY_CARE_PROVIDER_SITE_OTHER): Admitting: Family Medicine

## 2023-11-08 ENCOUNTER — Encounter (INDEPENDENT_AMBULATORY_CARE_PROVIDER_SITE_OTHER): Payer: Self-pay | Admitting: Family Medicine

## 2023-11-08 VITALS — BP 116/67 | HR 66 | Temp 98.1°F | Ht 63.0 in | Wt 177.0 lb

## 2023-11-08 DIAGNOSIS — E559 Vitamin D deficiency, unspecified: Secondary | ICD-10-CM

## 2023-11-08 DIAGNOSIS — R7303 Prediabetes: Secondary | ICD-10-CM

## 2023-11-08 DIAGNOSIS — Z6831 Body mass index (BMI) 31.0-31.9, adult: Secondary | ICD-10-CM

## 2023-11-08 DIAGNOSIS — E538 Deficiency of other specified B group vitamins: Secondary | ICD-10-CM | POA: Diagnosis not present

## 2023-11-08 NOTE — Progress Notes (Signed)
 Cheyenne Mcdonald, D.O.  ABFM, ABOM Specializing in Clinical Bariatric Medicine  Office located at: [address] [place], [place]  [address]    FOR THE CHRONIC DISEASE OF OBESITY:   Morbid obesity (HCC)-start bmi 38.97/date 07/27/21; BMI 31.0-31.9,adult current BMI 31.36  Weight Summary and Body Composition Analysis  Weight Lost Since Last Visit: 3lb  Weight Gained Since Last Visit: 0lb    Vitals Temp: 98.1 F (36.7 C) BP: 116/67 Pulse Rate: 66 SpO2: 97 %   Anthropometric Measurements Height: 5' 3 (1.6 m) Weight: 177 lb (80.3 kg) BMI (Calculated): 31.36 Weight at Last Visit: 180lb Weight Lost Since Last Visit: 3lb Weight Gained Since Last Visit: 0lb Starting Weight: 220lb Total Weight Loss (lbs): 48 lb (21.8 kg)   Body Composition  Body Fat %: 36.2 % Fat Mass (lbs): 64 lbs Muscle Mass (lbs): 107.2 lbs Total Body Water (lbs): 76.2 lbs Visceral Fat Rating : 11   Other Clinical Data Fasting: no Labs: no Today's Visit #: 31 Starting Date: 07/27/21    Chief complaint: Obesity  Interval History Cheyenne Mcdonald is here for a follow-up office visit to discuss her progress with her obesity treatment plan. She is on the Category 1 Plan and keeping a food journal and adhering to recommended goals of 950 calories and 75++ grams protein and states she is following her eating plan approximately 60% of the time. She is walking 90 minutes 6 days per week and  strength training 30 minutes 3 days per week.   She has experienced a weight loss of  3 lbs since last OV on 10/10/2023.   Her dietary and life habits include:  - Tracking Calories/Macros: no  - Eating More Whole Foods: no  - Adequate Protein Intake: no - she struggles to get in her lean proteins  - Adequate Water Intake: yes  - Skipping Meals: yes  - Sleeping 7-9 Hours/ Night: yes    10/10/23 08:00 11/08/23 09:00   Body Fat % 36.7 % 36.2 %  Muscle Mass (lbs) 108.2 lbs 107.2 lbs  Fat Mass  (lbs) 66 lbs 64 lbs  Total Body Water (lbs) 77 lbs 76.2 lbs  Visceral Fat Rating  12 11   Counseling done on how various foods will affect these numbers and how to maximize success  Total lbs lost to date: - 43 lbs Total Fat Mass lost to date:  -31.8 lbs Total weight loss percentage to date: - 19.55 %   Nutritional and Behavioral Counseling:  We discussed the following today: increasing lean protein intake to established goals, high protein snacking choices, continue to practice mindfulness when eating, eating multiple small meals a day to get in all their foods, using GPT or another AI platform for recipe ideas- searching low calorie, low carb, high protein chicken recipes etc, and making foods she's loves in a healthy manner.  Additional resources provided today: Handout on Healthy [employer]  and Provided patient with personalized instruction and demonstration on the use of artificial intelligence for recipes.  Evidence-based interventions for health behavior change were utilized today including the discussion of self monitoring techniques, problem-solving barriers and SMART goal setting techniques.   Regarding patient's less desirable eating habits and patterns, we employed the technique of small changes.   SMART Goal(s) created today: journaling consistently + increase lean protein intake   Recommended Dietary Goals Cheyenne Mcdonald is currently in the action stage of change. As such, her goal is to continue weight management plan.  She has agreed to continue CAT 1 MP and journaling 950 cal and 75+ grams protein daily.   Recommended Physical Activity Goals Cheyenne Mcdonald has been advised to work up to 300-450 minutes of moderate intensity aerobic activity a week and strengthening exercises 2-3 times per week for cardiovascular health, weight loss maintenance and preservation of muscle mass.   She may Continue to gradually increase the amount and intensity of exercise routine   Medical  Interventions and Pharmacotherapy Previous Bariatric surgery: n/a Pharmacotherapy: Continue with current nutritional and behavioral strategies   OBESITY RELATED CONDITIONS ADDRESSED TODAY:    Prediabetes Assessment & Plan: Lab Results  Component Value Date   HGBA1C 5.5 08/02/2023   HGBA1C 6.0 (H) 04/05/2023   HGBA1C 5.9 (H) 11/22/2022   INSULIN  5.5 08/02/2023   INSULIN  26.0 (H) 04/05/2023   INSULIN  14.6 11/22/2022    She has a history of Pre-DM which is managed with dietary and life style interventions. Hemoglobin A1c and fasting insulin  controlled. No complaints of excessive hunger and cravings. Continue working on nutrition plan to decrease simple carbohydrates, increase lean proteins and exercise to promote weight loss and prevent progression to T2DM.    Vitamin D  deficiency Assessment & Plan: Lab Results  Component Value Date   VD25OH 80.9 08/02/2023   VD25OH 70.1 04/05/2023   VD25OH 66.8 08/08/2022   Pt is doing well on OTC Vit D 2,000 units daily. No acute concerns. Continue same regimen. Recheck as deemed medically necessary.    B12 nutritional deficiency Assessment & Plan: Lab Results  Component Value Date   VITAMINB12 1,787 (H) 08/02/2023   On OTC B12 500 mcg every other day  with reported good compliance and tolerance. No acute concerns. Maintain with B12 supplementation and nutrient rich diet   Objective:   PHYSICAL EXAM: Blood pressure 116/67, pulse 66, temperature 98.1 F (36.7 C), height 5' 3 (1.6 m), weight 177 lb (80.3 kg), SpO2 97%. Body mass index is 31.35 kg/m.  General: she is overweight, cooperative and in no acute distress. PSYCH: Has normal mood, affect and thought process.   HEENT: EOMI, sclerae are anicteric. Lungs: Normal breathing effort, no conversational dyspnea. Extremities: Moves * 4 Neurologic: A and O * 3, good insight  DIAGNOSTIC DATA REVIEWED: BMET    Component Value Date/Time   NA 141 08/02/2023 1013   K 4.1  08/02/2023 1013   CL 106 08/02/2023 1013   CO2 21 08/02/2023 1013   GLUCOSE 83 08/02/2023 1013   GLUCOSE 83 08/01/2023 1014   BUN 26 08/02/2023 1013   CREATININE 1.18 (H) 08/02/2023 1013   CREATININE 1.22 (H) 08/01/2023 1014   CALCIUM 9.6 08/02/2023 1013   GFRNONAA 47 (L) 08/01/2023 1014   GFRAA 58 (L) 10/09/2019 0806   Lab Results  Component Value Date   HGBA1C 5.5 08/02/2023   HGBA1C 5.9 (H) 07/27/2021   Lab Results  Component Value Date   INSULIN  5.5 08/02/2023   INSULIN  16.9 07/27/2021   Lab Results  Component Value Date   TSH 2.200 07/27/2021   CBC    Component Value Date/Time   WBC 4.0 08/01/2023 1014   RBC 4.12 08/01/2023 1014   HGB 12.8 08/01/2023 1014   HGB 12.9 08/08/2022 0955   HCT 38.3 08/01/2023 1014   HCT 39.0 08/08/2022 0955   PLT 136 (L) 08/01/2023 1014   PLT 151 08/08/2022 0955   MCV 93.0 08/01/2023 1014   MCV 94 08/08/2022 0955   MCH 31.1 08/01/2023 1014   MCHC 33.4  08/01/2023 1014   RDW 12.5 08/01/2023 1014   RDW 12.7 08/08/2022 0955   Iron Studies No results found for: IRON, TIBC, FERRITIN, IRONPCTSAT Lipid Panel     Component Value Date/Time   CHOL 135 04/05/2023 0939   TRIG 92 04/05/2023 0939   HDL 51 04/05/2023 0939   CHOLHDL 2.6 04/05/2023 0939   LDLCALC 67 04/05/2023 0939   Hepatic Function Panel     Component Value Date/Time   PROT 6.7 08/01/2023 1014   PROT 6.5 08/08/2022 0955   ALBUMIN 4.1 08/01/2023 1014   ALBUMIN 4.2 08/08/2022 0955   AST 25 08/01/2023 1014   ALT 26 08/01/2023 1014   ALKPHOS 40 08/01/2023 1014   BILITOT 0.5 08/01/2023 1014      Component Value Date/Time   TSH 2.200 07/27/2021 1021   Nutritional Lab Results  Component Value Date   VD25OH 80.9 08/02/2023   VD25OH 70.1 04/05/2023   VD25OH 66.8 08/08/2022     Follow up:   Return 11/28/2023 at 10:20 AM.  She was informed of the importance of frequent follow up visits to maximize her success with intensive lifestyle modifications for her  multiple health conditions.   Attestations:   I, Special Puri, acting as a stage manager for [employer], DO., have compiled all relevant documentation for today's office visit on behalf of Cheyenne Jenkins, DO, while in the presence of [employer], DO.  Pertinent positives were addressed with patient today. Reviewed by clinician on day of visit: allergies, medications, problem list, medical history, surgical history, family history, social history, and previous encounter notes.  I have reviewed the above documentation for accuracy and completeness, and I agree with the above. Cheyenne Mcdonald, D.O.  The 21st Century Cures Act was signed into law in 2016 which includes the topic of electronic health records.  This provides immediate access to information in MyChart. This includes consultation notes, operative notes, office notes, lab results and pathology reports.  If you have any questions about what you read please let us  know at your next visit so we can discuss your concerns and take corrective action if need be.  We are right here with you.

## 2023-11-20 DIAGNOSIS — Z961 Presence of intraocular lens: Secondary | ICD-10-CM | POA: Diagnosis not present

## 2023-11-20 DIAGNOSIS — H40023 Open angle with borderline findings, high risk, bilateral: Secondary | ICD-10-CM | POA: Diagnosis not present

## 2023-11-20 DIAGNOSIS — H04123 Dry eye syndrome of bilateral lacrimal glands: Secondary | ICD-10-CM | POA: Diagnosis not present

## 2023-11-20 DIAGNOSIS — H43813 Vitreous degeneration, bilateral: Secondary | ICD-10-CM | POA: Diagnosis not present

## 2023-11-28 ENCOUNTER — Encounter (INDEPENDENT_AMBULATORY_CARE_PROVIDER_SITE_OTHER): Payer: Self-pay | Admitting: Family Medicine

## 2023-11-28 ENCOUNTER — Ambulatory Visit (INDEPENDENT_AMBULATORY_CARE_PROVIDER_SITE_OTHER): Admitting: Family Medicine

## 2023-11-28 DIAGNOSIS — E559 Vitamin D deficiency, unspecified: Secondary | ICD-10-CM

## 2023-11-28 DIAGNOSIS — R7303 Prediabetes: Secondary | ICD-10-CM | POA: Diagnosis not present

## 2023-11-28 DIAGNOSIS — Z6831 Body mass index (BMI) 31.0-31.9, adult: Secondary | ICD-10-CM | POA: Diagnosis not present

## 2023-11-28 DIAGNOSIS — N1831 Chronic kidney disease, stage 3a: Secondary | ICD-10-CM

## 2023-11-28 MED ORDER — VITAMIN D3 50 MCG (2000 UT) PO CAPS: ORAL_CAPSULE | ORAL | Status: AC

## 2023-11-28 NOTE — Progress Notes (Signed)
 Barnie DOROTHA Jenkins, D.O.  ABFM, ABOM Specializing in Clinical Bariatric Medicine  Office located at: [address] [place], [place]  [address]      A) FOR THE CHRONIC DISEASE OF OBESITY:  Chief complaint: Obesity Shelba is here to discuss her progress with her obesity treatment plan.   History of present illness / Interval history:  GEORGA STYS is here today for her follow-up office visit.  Since last OV on 11/08/23, pt is down 2 lbs. Patient states that she feels great and is journaling and doing good except last night she had 6 warm oatmeal cookies.     11/08/23 09:00 11/28/23 10:00   Body Fat % 36.2 % 33.4 %  Muscle Mass (lbs) 107.2 lbs 110.8 lbs  Fat Mass (lbs) 64 lbs 58.6 lbs  Total Body Water (lbs) 76.2 lbs 74.4 lbs  Visceral Fat Rating  11 11    Counseling done on how various foods will affect these numbers and how to maximize success  - Extensive review of body fat % and muscle mass and the importance of having good levels.  - Previous Metabolic age when starting was 68 or greater and is now 57. - Visceral fat rating was 15 or greater then and is now 11.   Total lbs lost to date: - 45 lbs Total Fat Mass in lbs lost to date: - 37.2 lbs Total weight loss percentage to date: - 20.45 %    Morbid obesity (HCC)-start bmi 38.97/date 07/27/21 BMI 31.0-31.9,adult current BMI 31.01  Nutrition Therapy She is on CAT 1 MP and journaling 950 cal and 75+ grams protein daily and states she is following her eating plan approximately 85 % of the time.   - Tracking Calories/Macros: yes  - Eating More Whole Foods: yes  - Adequate Protein Intake: no   - Adequate Water Intake: no   - Skipping Meals: yes  - Sleeping 7-9 Hours/ Night: no    Avanti is currently in the action stage of change. As such, her goal is to continue weight management plan.  She has agreed to: continue current plan   Physical Activity Charissa is walking 90 to 120 minutes 6 days per week and   strength training 30 minutes 3 days per week.    Ramie has been advised to work up to 300-450 minutes of moderate intensity aerobic activity a week and strengthening exercises 2-3 times per week for cardiovascular health, weight loss maintenance and preservation of muscle mass.  She has agreed to : Continue current level of physical activity    Behavioral Modifications Evidence-based interventions for health behavior change were utilized today including the discussion of  1) self monitoring techniques:    - Continue tracking and journaling  2) problem-solving barriers:    - Practice mindful eating when eating craving/sweets  3) SMART goals for next OV:    - Start water aerobics  Regarding patient's less desirable eating habits and patterns, we employed the technique of small changes.   We discussed the following today: practice mindfulness eating and understand the difference between hunger signals and cravings, continue to practice mindfulness when eating, and continue to work on maintaining a reduced calorie state, getting the recommended amount of protein, incorporating whole foods, making healthy choices, staying well hydrated and practicing mindfulness when eating. Additional resources provided today: None   Medical Interventions/ Pharmacotherapy Previous Bariatric surgery: n/a Pharmacotherapy for weight loss: She is not currently taking medications  for medical weight loss.  We discussed various medication options to help Kamica with her weight loss efforts and we both agreed to : Continue with current nutritional and behavioral strategies   B) OBESITY RELATED CONDITIONS ADDRESSED TODAY:  Prediabetes Assessment & Plan Lab Results  Component Value Date   HGBA1C 5.5 08/02/2023   HGBA1C 6.0 (H) 04/05/2023   HGBA1C 5.9 (H) 11/22/2022   INSULIN  5.5 08/02/2023   INSULIN  26.0 (H) 04/05/2023   INSULIN  14.6 11/22/2022    Managed with diet and life style changes. She endorses  that her hunger and cravings are well controlled. She states that she did eat 6 oat meal cookies but was aware that it was wrong of her and she had a great eating day prior to that. Continue following prudent meal plan and decreasing simple carbs and sugar to continue having stable A1c and Insulin  levels.     Vitamin D  deficiency Assessment & Plan Lab Results  Component Value Date   VD25OH 80.9 08/02/2023   VD25OH 70.1 04/05/2023   VD25OH 66.8 08/08/2022   On OTC Vit D 2000 units daily with reported good compliance and tolerance. Patient has no acute concerns today. She states that she does not take her Vit D supplements on the weekends. Continue with supplementation. Will obtain labs as medically necessary.     Chronic kidney disease, stage 3a [facility]) Assessment & Plan    Component Value Date/Time   NA 141 08/02/2023 1013   K 4.1 08/02/2023 1013   CL 106 08/02/2023 1013   CO2 21 08/02/2023 1013   GLUCOSE 83 08/02/2023 1013   GLUCOSE 83 08/01/2023 1014   BUN 26 08/02/2023 1013   CREATININE 1.18 (H) 08/02/2023 1013   CREATININE 1.22 (H) 08/01/2023 1014   CALCIUM 9.6 08/02/2023 1013   PROT 6.7 08/01/2023 1014   PROT 6.5 08/08/2022 0955   ALBUMIN 4.1 08/01/2023 1014   ALBUMIN 4.2 08/08/2022 0955   AST 25 08/01/2023 1014   ALT 26 08/01/2023 1014   ALKPHOS 40 08/01/2023 1014   BILITOT 0.5 08/01/2023 1014   EGFR 49 (L) 08/02/2023 1013   GFRNONAA 47 (L) 08/01/2023 1014   Patient is established with a nephrologist. She states that she is worried about her EGFR levels. BUN levels show that she is slightly dehydrated. Patient is aware that she need to increase her water intake and increasing her exercise to help with kidney function. Continue drinking half of your body weight in oz of water to ensure proper hydration. Will follow alongside specialist.    Medications Discontinued During This Encounter  Medication Reason   Cholecalciferol (VITAMIN D3) 50 MCG (2000 UT) capsule Reorder      Meds ordered this encounter  Medications   Cholecalciferol (VITAMIN D3) 50 MCG (2000 UT) capsule    Sig: Cut dose in half and take 2000 IU daily     Follow up:   Return 12/26/2023 at 10:00 AM  She was informed of the importance of frequent follow up visits to maximize her success with intensive lifestyle modifications for her multiple health conditions.   Weight Summary and Biometrics   Weight Lost Since Last Visit: 2lb  Weight Gained Since Last Visit: 0lb   Vitals Temp: 98.1 F (36.7 C) BP: 123/73 Pulse Rate: (!) 52 SpO2: 91 %   Anthropometric Measurements Height: 5' 3 (1.6 m) Weight: 175 lb (79.4 kg) BMI (Calculated): 31.01 Weight at Last Visit: 177lb Weight Lost Since Last Visit: 2lb Weight Gained Since Last Visit: 0lb Starting Weight: 220lb Total  Weight Loss (lbs): 50 lb (22.7 kg)   Body Composition  Body Fat %: 33.4 % Fat Mass (lbs): 58.6 lbs Muscle Mass (lbs): 110.8 lbs Total Body Water (lbs): 74.4 lbs Visceral Fat Rating : 11   Other Clinical Data Fasting: yes Labs: no Today's Visit #: 32 Starting Date: 07/27/21    Objective:   PHYSICAL EXAM: Blood pressure 123/73, pulse (!) 52, temperature 98.1 F (36.7 C), height 5' 3 (1.6 m), weight 175 lb (79.4 kg), SpO2 91%. Body mass index is 31 kg/m.  General: she is overweight, cooperative and in no acute distress. PSYCH: Has normal mood, affect and thought process.   HEENT: EOMI, sclerae are anicteric. Lungs: Normal breathing effort, no conversational dyspnea. Extremities: Moves * 4 Neurologic: A and O * 3, good insight  DIAGNOSTIC DATA REVIEWED: BMET    Component Value Date/Time   NA 141 08/02/2023 1013   K 4.1 08/02/2023 1013   CL 106 08/02/2023 1013   CO2 21 08/02/2023 1013   GLUCOSE 83 08/02/2023 1013   GLUCOSE 83 08/01/2023 1014   BUN 26 08/02/2023 1013   CREATININE 1.18 (H) 08/02/2023 1013   CREATININE 1.22 (H) 08/01/2023 1014   CALCIUM 9.6 08/02/2023 1013   GFRNONAA 47  (L) 08/01/2023 1014   GFRAA 58 (L) 10/09/2019 0806   Lab Results  Component Value Date   HGBA1C 5.5 08/02/2023   HGBA1C 5.9 (H) 07/27/2021   Lab Results  Component Value Date   INSULIN  5.5 08/02/2023   INSULIN  16.9 07/27/2021   Lab Results  Component Value Date   TSH 2.200 07/27/2021   CBC    Component Value Date/Time   WBC 4.0 08/01/2023 1014   RBC 4.12 08/01/2023 1014   HGB 12.8 08/01/2023 1014   HGB 12.9 08/08/2022 0955   HCT 38.3 08/01/2023 1014   HCT 39.0 08/08/2022 0955   PLT 136 (L) 08/01/2023 1014   PLT 151 08/08/2022 0955   MCV 93.0 08/01/2023 1014   MCV 94 08/08/2022 0955   MCH 31.1 08/01/2023 1014   MCHC 33.4 08/01/2023 1014   RDW 12.5 08/01/2023 1014   RDW 12.7 08/08/2022 0955   Iron Studies No results found for: IRON, TIBC, FERRITIN, IRONPCTSAT Lipid Panel     Component Value Date/Time   CHOL 135 04/05/2023 0939   TRIG 92 04/05/2023 0939   HDL 51 04/05/2023 0939   CHOLHDL 2.6 04/05/2023 0939   LDLCALC 67 04/05/2023 0939   Hepatic Function Panel     Component Value Date/Time   PROT 6.7 08/01/2023 1014   PROT 6.5 08/08/2022 0955   ALBUMIN 4.1 08/01/2023 1014   ALBUMIN 4.2 08/08/2022 0955   AST 25 08/01/2023 1014   ALT 26 08/01/2023 1014   ALKPHOS 40 08/01/2023 1014   BILITOT 0.5 08/01/2023 1014      Component Value Date/Time   TSH 2.200 07/27/2021 1021   Nutritional Lab Results  Component Value Date   VD25OH 80.9 08/02/2023   VD25OH 70.1 04/05/2023   VD25OH 66.8 08/08/2022    Attestations:   I, Sonny Laroche, acting as a stage manager for Barnie Jenkins, DO., have compiled all relevant documentation for today's office visit on behalf of Barnie Jenkins, DO, while in the presence of [employer], DO.   I have reviewed the above documentation for accuracy and completeness, and I agree with the above. Barnie JINNY Jenkins, D.O.  The 21st Century Cures Act was signed into law in 2016 which includes the topic of electronic  health  records.  This provides immediate access to information in MyChart.  This includes consultation notes, operative notes, office notes, lab results and pathology reports.  If you have any questions about what you read please let us  know at your next visit so we can discuss your concerns and take corrective action if need be.  We are right here with you.

## 2023-12-01 ENCOUNTER — Inpatient Hospital Stay: Attending: Hematology and Oncology | Admitting: Hematology and Oncology

## 2023-12-01 VITALS — BP 127/66 | HR 65 | Temp 98.0°F | Resp 16 | Wt 179.5 lb

## 2023-12-01 DIAGNOSIS — C50412 Malignant neoplasm of upper-outer quadrant of left female breast: Secondary | ICD-10-CM | POA: Diagnosis not present

## 2023-12-01 DIAGNOSIS — Z79810 Long term (current) use of selective estrogen receptor modulators (SERMs): Secondary | ICD-10-CM | POA: Diagnosis not present

## 2023-12-01 DIAGNOSIS — Z171 Estrogen receptor negative status [ER-]: Secondary | ICD-10-CM

## 2023-12-01 DIAGNOSIS — Z923 Personal history of irradiation: Secondary | ICD-10-CM | POA: Insufficient documentation

## 2023-12-01 DIAGNOSIS — M792 Neuralgia and neuritis, unspecified: Secondary | ICD-10-CM | POA: Insufficient documentation

## 2023-12-01 DIAGNOSIS — Z17421 Hormone receptor negative with human epidermal growth factor receptor 2 negative status: Secondary | ICD-10-CM | POA: Diagnosis not present

## 2023-12-01 NOTE — Progress Notes (Signed)
 [facility]  Telephone:([phone]) [phone] Fax:([phone]) [phone]    ID: Cheyenne Mcdonald DOB: [birth year]  MR#: [id number]  RDW#:[id number]  Patient Care Team: Rosalea Rosina SAILOR, PA as PCP - General (Physician Assistant) Rutherford Gain, MD as Consulting Physician (Obstetrics and Gynecology) Tyree Nanetta SAILOR, RN as Oncology Nurse Navigator Vernetta Berg, MD as Consulting Physician (General Surgery) Shannon Agent, MD as Consulting Physician (Radiation Oncology) Sheryl Katz, MD as Consulting Physician (Nephrology) Loretha Ash, MD as Consulting Physician (Hematology and Oncology) Ash Loretha, MD   CHIEF COMPLAINT: 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    Discussed the use of AI scribe software for clinical note transcription with the patient, who gave verbal consent to proceed.  History of Present Illness Cheyenne Mcdonald is a 73 year old female with a history of triple negative breast cancer who presents for a routine follow-up.     Cheyenne Mcdonald is a 73 year old female with a history of triple negative breast cancer who presents for routine follow-up.  She was diagnosed with triple negative breast cancer in 2021 and has been in remission for four years. She has been receiving regular follow-ups every four months. She is concerned about the recurrence of her cancer, noting that 'triple negative pops up,' but finds reassurance in the timeline of her remission.  She is currently taking tamoxifen  and experiences hot flashes, stating that sometimes she has to 'just take the wig off' due to the intensity.  She has been actively managing her weight and health, having lost almost fifty pounds over the past two years. She attributes this to her participation in the [facility]  program, where she engages in walking six days a week and strength training. She reports a weight loss from 225 pounds to 175 pounds and mentions that she has lost five pounds of fat and gained three and a half pounds of muscle. She aims to reduce her body fat to thirty percent.  She mentions experiencing soreness under her arm but notes there are no lumps.  She recently spent two months in [place], where she participated in an intensive language learning program and lived with a host family. She enjoys walking and strength training as part of her health regimen.    Rest of the pertinent 10 point ROS reviewed and neg   COVID 19 VACCINATION STATUS: Status post Pfizer x2 with booster October 2021; also had COVID July 2022   HISTORY OF CURRENT ILLNESS: From the original intake note:  Cheyenne Mcdonald presented for her routine mammography with a palpable left breast lump. She underwent bilateral diagnostic mammography with tomography and left breast ultrasonography at [facility] on 09/13/2019 showing: breast density category B; two adjacent masses in left breast at 1 o'clock, spanning approximately 3.3 cm; one indeterminate left axillary lymph node with 0.4 cm cortical bulge; no evidence right breast malignancy.  Accordingly on 09/27/2019 she proceeded to biopsy of the left breast areas in question. The pathology from this procedure ([id number]) showed: invasive ductal carcinoma, grade 3, present in both of the adjacent masses. Prognostic indicators significant for: estrogen receptor, 0% negative and progesterone receptor, 0% negative. Proliferation marker Ki67 at 90%. HER2 equivocal by immunohistochemistry (2+), but negative by fluorescent in situ hybridization with a signals ratio 2.2 and number per cell 3.3. Additional analysis of  more cells showed a signals ratio 2.05 and number per cell 3.13.  The biopsied lymph node was negative for carcinoma.  This was felt to be concordant  The patient's  subsequent history is as detailed below.   PAST MEDICAL HISTORY: Past Medical History:  Diagnosis Date   Anemia    Arthritis    gout big toe   Breast cancer (HCC)    left breast IDC, right breast DCIS   History of radiation therapy 07/20/2020   bilateral breasts 06/01/2020-07/20/2020  Dr Lynwood Nasuti   Hypertension    Kidney problem    Obesity    Personal history of chemotherapy    2022 bilat lumpectomies w/rad w/chemo   Personal history of radiation therapy    2022 bilat lumpectomies w/rad w/chemo   Port-A-Cath in place 10/22/2019   Prediabetes    Sleep apnea    does not use CPAP   Vitamin D  deficiency   heart murmur (since childhood)   PAST SURGICAL HISTORY: Past Surgical History:  Procedure Laterality Date   ABDOMINAL HYSTERECTOMY     BREAST LUMPECTOMY Bilateral    2022 bilat lumpectomies w/rad w/chemo   BREAST LUMPECTOMY WITH RADIOACTIVE SEED AND SENTINEL LYMPH NODE BIOPSY Left 04/16/2020   Procedure: LEFT BREAST LUMPECTOMY WITH RADIOACTIVE SEED AND LEFT AXILLARY SENTINEL LYMPH NODE BIOPSY;  Surgeon: Vernetta Berg, MD;  Location: [facility];  Service: General;  Laterality: Left;   BREAST LUMPECTOMY WITH RADIOACTIVE SEED LOCALIZATION Right 04/16/2020   Procedure: RIGHT BREAST LUMPECTOMY WITH RADIOACTIVE SEED LOCALIZATION;  Surgeon: Vernetta Berg, MD;  Location: [facility];  Service: General;  Laterality: Right;   IR IMAGING GUIDED PORT INSERTION  10/18/2019   PORT-A-CATH REMOVAL Right 09/02/2020   Procedure: REMOVAL PORT-A-CATH;  Surgeon: Vernetta Berg, MD;  Location: [facility];  Service: General;  Laterality: Right;    FAMILY HISTORY: Family History  Problem Relation Age of Onset   High Cholesterol Mother    Breast cancer Cousin 46       maternal cousin; bilateral    Her father died at age 61, cause of death unknown. Her mother is age 66 as of 09/2019. Cheyenne Mcdonald has two brothers (and no sisters). She reports one  cousin with breast cancer at age 79.   GYNECOLOGIC HISTORY:  No LMP recorded. Patient has had a hysterectomy. Menarche: 73 years old Age at first live birth: 73 years old GX P 2 LMP 1990 Contraceptive: used for maybe 20 years HRT: never used  Hysterectomy? Yes, 1990 BSO? 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 Husband Cheyenne Mcdonald is retired hotel manager and then retired therapist, occupational.  At home is just the 2 of them. Daughter Cheyenne Mcdonald, age 29, works in data entry in [employer]. Son Cheyenne Mcdonald, age 68, has a college degree but works for [employer] in [employer]. Cheyenne Mcdonald has four grandchildren. She attends [employer] [address]    ADVANCED DIRECTIVES: In place   HEALTH MAINTENANCE: Social History   Tobacco Use   Smoking status: Never   Smokeless tobacco: Never  Substance Use Topics   Alcohol use: Never   Drug use: Never     Colonoscopy: 2018 (Dr. Kristie)  PAP: approx. 2013  Bone density: 2016, normal   Allergies  Allergen Reactions   Shellfish Allergy Itching    Current Outpatient Medications  Medication Sig Dispense Refill   Cholecalciferol (VITAMIN D3) 50 MCG (2000 UT) capsule Cut dose  in half and take 2000 IU daily     Cyanocobalamin  (VITAMIN B12) 500 MCG TABS 300-500mcg take every other day     Misc Natural Products (ELDERBERRY ZINC/VIT C/IMMUNE) LOZG Use as directed in the mouth or throat.     tamoxifen  (NOLVADEX ) 20 MG tablet Take 1 tablet (20 mg total) by mouth daily. 90 tablet 3   No current facility-administered medications for this visit.    OBJECTIVE: African American woman who appears younger than stated age  Vitals:   12/01/23 0938  BP: 127/66  Pulse: 65  Resp: 16  Temp: 98 F (36.7 C)  SpO2: 100%       Body mass index is 31.8 kg/m.   Wt Readings from Last 3 Encounters:  12/01/23 179 lb 8 oz (81.4 kg)  11/28/23 175 lb (79.4 kg)  11/08/23 177 lb (80.3 kg)     ECOG FS:1 - Symptomatic but  completely ambulatory  Physical Exam Constitutional:      General: She is not in acute distress.    Appearance: Normal appearance.  HENT:     Head: Normocephalic and atraumatic.  Chest:     Comments: Bilateral breast status post lumpectomies.  Postsurgical changes, otherwise no palpable masses or regional adenopathy. Musculoskeletal:     Cervical back: Normal range of motion and neck supple. No rigidity.  Skin:    General: Skin is warm and dry.  Neurological:     Mental Status: She is alert.       LAB RESULTS:  CMP     Component Value Date/Time   NA 141 08/02/2023 1013   K 4.1 08/02/2023 1013   CL 106 08/02/2023 1013   CO2 21 08/02/2023 1013   GLUCOSE 83 08/02/2023 1013   GLUCOSE 83 08/01/2023 1014   BUN 26 08/02/2023 1013   CREATININE 1.18 (H) 08/02/2023 1013   CREATININE 1.22 (H) 08/01/2023 1014   CALCIUM 9.6 08/02/2023 1013   PROT 6.7 08/01/2023 1014   PROT 6.5 08/08/2022 0955   ALBUMIN 4.1 08/01/2023 1014   ALBUMIN 4.2 08/08/2022 0955   AST 25 08/01/2023 1014   ALT 26 08/01/2023 1014   ALKPHOS 40 08/01/2023 1014   BILITOT 0.5 08/01/2023 1014   GFRNONAA 47 (L) 08/01/2023 1014   GFRAA 58 (L) 10/09/2019 0806    No results found for: TOTALPROTELP, ALBUMINELP, A1GS, A2GS, BETS, BETA2SER, GAMS, MSPIKE, SPEI  Lab Results  Component Value Date   WBC 4.0 08/01/2023   NEUTROABS 2.4 08/01/2023   HGB 12.8 08/01/2023   HCT 38.3 08/01/2023   MCV 93.0 08/01/2023   PLT 136 (L) 08/01/2023    No results found for: LABCA2  No components found for: [id number]  No results for input(s): INR in the last 168 hours.  No results found for: LABCA2  No results found for: RJW800  No results found for: CAN125  No results found for: CAN153  No results found for: CA2729  No components found for: HGQUANT  No results found for: CEA1, CEA / No results found for: CEA1, CEA   No results found for: AFPTUMOR  No results found  for: CHROMOGRNA  No results found for: KPAFRELGTCHN, LAMBDASER, KAPLAMBRATIO (kappa/lambda light chains)  No results found for: HGBA, HGBA2QUANT, HGBFQUANT, HGBSQUAN (Hemoglobinopathy evaluation)   No results found for: LDH  No results found for: IRON, TIBC, IRONPCTSAT (Iron and TIBC)  No results found for: FERRITIN  Urinalysis No results found for: COLORURINE, APPEARANCEUR, LABSPEC, PHURINE, GLUCOSEU, HGBUR, BILIRUBINUR, KETONESUR, PROTEINUR, UROBILINOGEN, NITRITE, LEUKOCYTESUR  STUDIES: No results found.   ELIGIBLE FOR AVAILABLE RESEARCH PROTOCOL: AET  ASSESSMENT: 73 y.o. [place] woman status post left breast upper outer quadrant biopsy 09/27/2019 for a clinically T1-T2 N0, stage IA- IIA invasive ductal carcinoma, grade 3, triple negative, with an MIB-1 of 90%.  (1) genetics testing 10/30/2019 through the [facility] Common Hereditary Cancers Panel found no deleterious mutations in APC, ATM, AXIN2, BARD1, BMPR1A, BRCA1, BRCA2, BRIP1, CDH1, CDK4, CDKN2A (p14ARF), CDKN2A (p16INK4a), CHEK2, CTNNA1, DICER1, EPCAM (Deletion/duplication testing only), GREM1 (promoter region deletion/duplication testing only), KIT, MEN1, MLH1, MSH2, MSH3, MSH6, MUTYH, NBN, NF1, NHTL1, PALB2, PDGFRA, PMS2, POLD1, POLE, PTEN, RAD50, RAD51C, RAD51D, RNF43, SDHB, SDHC, SDHD, SMAD4, SMARCA4. STK11, TP53, TSC1, TSC2, and VHL.  The following genes were evaluated for sequence changes only: SDHA and HOXB13 c.251G>A variant only.  LEFT BREAST: (2) neoadjuvant chemotherapy consisting of doxorubicin  and cyclophosphamide  in dose dense fashion x4 starting 10/22/2019, completed 12/03/2019,followed by weekly carboplatin  and paclitaxel  x12 started 12/17/2019, last dose 02/25/2020  (a) echo 10/17/2019 shows an ejection fraction in the 60-65% range  (b) final 2 doses of carboplatinum/paclitaxel  omitted with grade 1 neuropathy  (3) status post left lumpectomy and sentinel  lymph node sampling 04/16/2020 for a ypT0 ypN0, complete pathologic response  (a) a total of 3 left axillary lymph nodes were removed  RIGHT BREAST: (4) multicentric biopsy x2 on the right (contralateral) breast 02/27/2020 shows ductal carcinoma in situ, estrogen and progesterone receptor strongly positive  (5) right lumpectomy 04/16/2020 shows a residual 1.4 cm of ductal carcinoma in situ, grade 1, with close but negative margins  (6) adjuvant radiation to both breasts 06/01/2020 through 07/20/2020 Site Technique Total Dose (Gy) Dose per Fx (Gy) Completed Fx Beam Energies  Breast, Left: Breast_Lt 3D 50.4/50.4 1.8 28/28 10X  Breast, Left: Breast_Lt_Bst 3D 10/10 2 5/5 6X, 10X  Breast, Right: Breast_Rt 3D 50.4/50.4 1.8 28/28 10X  Breast, Right: Breast_Rt_Bst 3D 12/12 2 6/6 6X, 10X   (7) tamoxifen  started 09/02/2020   PLAN:  Ms. Helbling is here for follow-up on tamoxifen .  She is tolerating this very well without any major side effects.  Assessment and Plan Assessment & Plan Triple negative breast cancer Currently in remission, four yrs post diagnosis. No concerning ROS or PE findings Breast exam with no concerns. Mammogram scheduled for November. Continue to monitor every 6 months. She is not quite sure about considering guardant reveal  Axillary post-surgical neuralgia/tenderness Intermittent soreness under the arm, likely due to axillary nerve impact from previous surgery. No concerning findings. - Monitor for changes in symptoms or development of lumps.  Weight loss and healthy lifestyle management Significant weight loss of 50 pounds over two years through lifestyle changes. Current weight 175 pounds. Engaged in intermittent fasting and strength training, aiming to reduce body fat to 30%. - Continue current exercise regimen and dietary modifications. - Monitor weight and body composition.  Total time spent: 30 minutes  *Total Encounter Time as defined by the Centers for  Medicare and Medicaid Services includes, in addition to the face-to-face time of a patient visit (documented in the note above) non-face-to-face time: obtaining and reviewing outside history, ordering and reviewing medications, tests or procedures, care coordination (communications with other health care professionals or caregivers) and documentation in the medical record.

## 2023-12-05 ENCOUNTER — Encounter

## 2023-12-25 ENCOUNTER — Ambulatory Visit
Admission: RE | Admit: 2023-12-25 | Discharge: 2023-12-25 | Disposition: A | Discharge status: Home / Self Care | Source: Ambulatory Visit | Attending: Hematology and Oncology | Admitting: Hematology and Oncology

## 2023-12-25 DIAGNOSIS — C50412 Malignant neoplasm of upper-outer quadrant of left female breast: Secondary | ICD-10-CM

## 2023-12-25 DIAGNOSIS — D0511 Intraductal carcinoma in situ of right breast: Secondary | ICD-10-CM

## 2023-12-26 ENCOUNTER — Ambulatory Visit (INDEPENDENT_AMBULATORY_CARE_PROVIDER_SITE_OTHER): Admitting: Family Medicine

## 2024-01-10 ENCOUNTER — Other Ambulatory Visit: Payer: Self-pay | Admitting: *Deleted

## 2024-01-10 MED ORDER — TAMOXIFEN CITRATE 20 MG PO TABS: 20.0000 mg | ORAL_TABLET | Freq: Every day | ORAL | 3 refills | Status: AC

## 2024-01-22 ENCOUNTER — Ambulatory Visit: Attending: Surgery

## 2024-01-22 DIAGNOSIS — Z483 Aftercare following surgery for neoplasm: Secondary | ICD-10-CM | POA: Insufficient documentation

## 2024-01-30 ENCOUNTER — Ambulatory Visit (INDEPENDENT_AMBULATORY_CARE_PROVIDER_SITE_OTHER): Admitting: Family Medicine

## 2024-01-30 ENCOUNTER — Encounter (INDEPENDENT_AMBULATORY_CARE_PROVIDER_SITE_OTHER): Payer: Self-pay | Admitting: Family Medicine

## 2024-01-30 DIAGNOSIS — N1831 Chronic kidney disease, stage 3a: Secondary | ICD-10-CM

## 2024-01-30 DIAGNOSIS — R7303 Prediabetes: Secondary | ICD-10-CM | POA: Diagnosis not present

## 2024-01-30 DIAGNOSIS — E559 Vitamin D deficiency, unspecified: Secondary | ICD-10-CM

## 2024-01-30 DIAGNOSIS — Z6832 Body mass index (BMI) 32.0-32.9, adult: Secondary | ICD-10-CM | POA: Diagnosis not present

## 2024-01-30 DIAGNOSIS — Z6831 Body mass index (BMI) 31.0-31.9, adult: Secondary | ICD-10-CM

## 2024-01-30 NOTE — Progress Notes (Signed)
 "  Barnie DOROTHA Jenkins, D.O.  ABFM, ABOM Specializing in Clinical Bariatric Medicine  Office located at: [address] [place], [place]  [address]     Review A1C, Vit D, CMP and B12 Labs at next OV.   Orders Placed This Encounter  Procedures   Comprehensive metabolic panel with GFR   Hemoglobin A1c   Vitamin B12   VITAMIN D  25 Hydroxy (Vit-D Deficiency, Fractures)      FOR THE CHRONIC DISEASE OF OBESITY:  Chief complaint: Obesity Cheyenne Mcdonald is here to discuss her progress with her obesity treatment plan.   History of present illness / Interval history:  Cheyenne Mcdonald is here today for her follow-up office visit.  Since last OV on 11/28/23 with provider: Destane Speas, patient states that the holidays were a challenge but she is working on getting back on track.   Pt has been struggling with:  []  meal planning and prepping []  exercise [x]  sleep []  stressors []  mood []  chronic or acute medical conditions-  []  eating out more []  Nothing, doing great  Pt has been working on and improving their:  []  meal planning and prepping [x]  exercise []  sleep hygiene []  water intake []  strategies to better manage personal stressors []  strategies to better manage mood []  eating out less []  Nothing particular at this time  When asked by CMA prior to our office visit today, pt states they have been:  - Focused on eating fresh, unprocessed foods?  Yes   - Focused on eating lean proteins with each meal?  No - Sleeping 7-9 Hours/ Night?  No - Skipping Meals?  No  - States she is following her healthy eating plan approximately 60 % of the time.   Recent weight loss data history   11/28/23 10:00 01/30/24 10:00   Body Fat % 33.4 % 36.6 %  Muscle Mass (lbs) 110.8 lbs 110 lbs  Fat Mass (lbs) 58.6 lbs 67 lbs  Total Body Water (lbs) 74.4 lbs 74.8 lbs  Visceral Fat Rating  11 12     Total lbs lost to date: - 38 lbs Total Fat Mass in lbs lost to date: - 28.8 lbs Total weight loss percentage  to date since starting program:  - 17.27 %    Morbid obesity (HCC)-start bmi 38.97/date 07/27/21 BMI 31.0-31.9,adult current BMI 32.25  Physician directed Nutrition Therapy prescription: She is on keeping a food journal and adhering to recommended goals of 950 calories and 75+ protein.    Cheyenne Mcdonald is currently in the action stage of change. As such, her goal is to get back on track and journal.  Detailed, physician-directed nutritional counseling was provided, including: [] Discussed meal prep companies. Ie. [employer], [employer], [employer], [employer] etc [] Discussed strategies for meal planning and prepping at home [] Discussed supermarket healthy prepared meal options- ie. [employer] natural meals or [employer] meals    She has agreed to: continue current reduced-calorie meal plan    Physician directed Behavioral Modification prescription: Evidence-based interventions for healthy behavior change were utilized today including the discussion of small changes and SMART goals. barriers to successful adherence to behavorial change for wt loss: Recent holidays and time  We discussed the following today: increasing lean protein intake to established goals, work on meal planning and preparation, reading food labels , keeping healthy foods at home, better snacking choices, staying on track while traveling and vacationing, and focusing on food with a 10:1 ratio of calories: grams of protein    Physician  directed Physical Activity prescription: Cheyenne Mcdonald is walking and doing strength training 30  minutes 3 days per week barriers to successful adherence to exercise for wt loss: not enough time and transportation issues  Cheyenne Mcdonald has been educated on improves insulin  sensitivity and helps the body use the food you eat for energy rather than storing it as fat  Exercise prescription: She has agreed to Increase volume of physical activity to a goal of 240 minutes a week    Medical  Interventions/ Pharmacotherapy  Previously tried medical weight loss medications/ therapies: None  Pharmacotherapy for weight loss: She is not currently taking medications  for medical weight loss.     We discussed various medication options to help Cheyenne Mcdonald with her weight loss efforts and we both agreed to:  Patient was advised to restart exercise but was educated on Metformin and how it could be beneficial for her.   SPECIFIC behavorial / nutritional / exercise goals for next office visit:   plans to RESTART their healthy eating habits / restart meal plan. Increase exercise  B) OBESITY RELATED CONDITIONS ADDRESSED TODAY:  Prediabetes Assessment & Plan Lab Results  Component Value Date   HGBA1C 5.5 08/02/2023   HGBA1C 6.0 (H) 04/05/2023   HGBA1C 5.9 (H) 11/22/2022   INSULIN  5.5 08/02/2023   INSULIN  26.0 (H) 04/05/2023   INSULIN  14.6 11/22/2022    Diet and life style controlled. Patient states that her hunger and cravings are well controlled. Nutrition counseling done with patient today. Patient states that she eats 1 egg with about 4 egg whites with ham and cheese and the fair life skim milk and sometimes greek yogurt that gets 60 g of protein. She states that when she focuses on her protein it makes her feel good.  Explained to patient how she could benefit from Metformin. Patient wishes to get back on track and journal before starting medication. Will obtain labs today and review at next OV.     Stage 3a chronic kidney disease [facility]) Assessment & Plan Lab Results  Component Value Date   NA 141 08/02/2023   CL 106 08/02/2023   K 4.1 08/02/2023   CO2 21 08/02/2023   BUN 26 08/02/2023   CREATININE 1.18 (H) 08/02/2023   EGFR 49 (L) 08/02/2023   CALCIUM 9.6 08/02/2023   ALBUMIN 4.1 08/01/2023   GLUCOSE 83 08/02/2023   Chronic Kidney Disease Stage 3a Condition stable, stable disease monitoring labs, disease monitoring labs ordered, please refer to orders, continue clinical  weight loss program Established with a nephrologist. Continue following up with specialist/PCP.     Vitamin D  deficiency Assessment & Plan Lab Results  Component Value Date   VD25OH 80.9 08/02/2023   VD25OH 70.1 04/05/2023   VD25OH 66.8 08/08/2022   Taking OTC Vitamin D3 2000 units daily with reported good compliance and tolerance. Patient states that she has no issues remembering to take supplement. Labs have been stable. Since no labs have been obtained in over 5 months will obtain labs today and review at next OV. Continue with supplementation.     Follow up:    She was informed of the importance of frequent follow up visits to maximize her success with intensive lifestyle modifications for her multiple health conditions.  Labs obtained today and will be discussed/ addressed at their next office visit unless critical medical findings arise, then we will contact via Mychart or phone call.   Weight Summary and Biometrics   Weight Lost Since Last Visit: 0lb  Weight  Gained Since Last Visit: 7lb   Vitals BP: 121/79 Pulse Rate: 70 SpO2: 98 %   Anthropometric Measurements Height: 5' 3 (1.6 m) Weight: 182 lb (82.6 kg) BMI (Calculated): 32.25 Weight at Last Visit: 175lb Weight Lost Since Last Visit: 0lb Weight Gained Since Last Visit: 7lb Starting Weight: 220lb Total Weight Loss (lbs): 38 lb (17.2 kg)   Body Composition  Body Fat %: 36.6 % Fat Mass (lbs): 67 lbs Muscle Mass (lbs): 110 lbs Total Body Water (lbs): 74.8 lbs Visceral Fat Rating : 12   Other Clinical Data Fasting: no Labs: no Today's Visit #: 33 Starting Date: 07/27/21    Objective:   PHYSICAL EXAM: Blood pressure 121/79, pulse 70, height 5' 3 (1.6 m), weight 182 lb (82.6 kg), SpO2 98%. Body mass index is 32.24 kg/m. General: she is overweight, cooperative and in no acute distress. PSYCH: Has normal mood, affect and thought process.   HEENT: EOMI, sclerae are anicteric. Lungs: Normal  breathing effort, no conversational dyspnea. Extremities: Moves * 4 Neurologic: A and O * 3, good insight  DIAGNOSTIC DATA REVIEWED: BMET    Component Value Date/Time   NA 141 08/02/2023 1013   K 4.1 08/02/2023 1013   CL 106 08/02/2023 1013   CO2 21 08/02/2023 1013   GLUCOSE 83 08/02/2023 1013   GLUCOSE 83 08/01/2023 1014   BUN 26 08/02/2023 1013   CREATININE 1.18 (H) 08/02/2023 1013   CREATININE 1.22 (H) 08/01/2023 1014   CALCIUM 9.6 08/02/2023 1013   GFRNONAA 47 (L) 08/01/2023 1014   GFRAA 58 (L) 10/09/2019 0806   Lab Results  Component Value Date   HGBA1C 5.5 08/02/2023   HGBA1C 5.9 (H) 07/27/2021   Lab Results  Component Value Date   INSULIN  5.5 08/02/2023   INSULIN  16.9 07/27/2021   Lab Results  Component Value Date   TSH 2.200 07/27/2021   CBC    Component Value Date/Time   WBC 4.0 08/01/2023 1014   RBC 4.12 08/01/2023 1014   HGB 12.8 08/01/2023 1014   HGB 12.9 08/08/2022 0955   HCT 38.3 08/01/2023 1014   HCT 39.0 08/08/2022 0955   PLT 136 (L) 08/01/2023 1014   PLT 151 08/08/2022 0955   MCV 93.0 08/01/2023 1014   MCV 94 08/08/2022 0955   MCH 31.1 08/01/2023 1014   MCHC 33.4 08/01/2023 1014   RDW 12.5 08/01/2023 1014   RDW 12.7 08/08/2022 0955   Iron Studies No results found for: IRON, TIBC, FERRITIN, IRONPCTSAT Lipid Panel     Component Value Date/Time   CHOL 135 04/05/2023 0939   TRIG 92 04/05/2023 0939   HDL 51 04/05/2023 0939   CHOLHDL 2.6 04/05/2023 0939   LDLCALC 67 04/05/2023 0939   Hepatic Function Panel     Component Value Date/Time   PROT 6.7 08/01/2023 1014   PROT 6.5 08/08/2022 0955   ALBUMIN 4.1 08/01/2023 1014   ALBUMIN 4.2 08/08/2022 0955   AST 25 08/01/2023 1014   ALT 26 08/01/2023 1014   ALKPHOS 40 08/01/2023 1014   BILITOT 0.5 08/01/2023 1014      Component Value Date/Time   TSH 2.200 07/27/2021 1021   Nutritional Lab Results  Component Value Date   VD25OH 80.9 08/02/2023   VD25OH 70.1 04/05/2023    VD25OH 66.8 08/08/2022    Attestations:   I, Sonny Laroche, acting as a stage manager for Barnie Jenkins, DO., have compiled all relevant documentation for today's office visit on behalf of Barnie Jenkins, DO, while in  the presence of Cheyenne & Mclennan, DO.  I have spent 40 minutes in the care of the patient including:   -   2 minutes before the visit reviewing and preparing the chart.   -   30 minutes face-to-face assessing and reviewing listed medical problems as outlined in obesity care plan, providing nutritional and behavioral counseling on topics outlined in the obesity care plan, independently interpreting test results and goals of care as described in assessment and plan, and reviewing and discussing biometric information and progress with obesity treatment plan  -   8 minutes after the visit regarding the EMR documentation of encounter.   I have reviewed the above documentation for accuracy and completeness, and I agree with the above. Barnie JINNY Jenkins, D.O.  The 21st Century Cures Act was signed into law in 2016 which includes the topic of electronic health records.  This provides immediate access to information in MyChart.  This includes consultation notes, operative notes, office notes, lab results and pathology reports.  If you have any questions about what you read please let us  know at your next visit so we can discuss your concerns and take corrective action if need be.  We are right here with you.  "

## 2024-01-31 LAB — COMPREHENSIVE METABOLIC PANEL WITH GFR
ALT: 19 IU/L (ref 0–32)
AST: 36 IU/L (ref 0–40)
Albumin: 4.1 g/dL (ref 3.8–4.8)
Alkaline Phosphatase: 49 IU/L (ref 49–135)
BUN/Creatinine Ratio: 17 (ref 12–28)
BUN: 19 mg/dL (ref 8–27)
Bilirubin Total: 0.3 mg/dL (ref 0.0–1.2)
CO2: 24 mmol/L (ref 20–29)
Calcium: 9.7 mg/dL (ref 8.7–10.3)
Chloride: 104 mmol/L (ref 96–106)
Creatinine, Ser: 1.09 mg/dL — ABNORMAL HIGH (ref 0.57–1.00)
Globulin, Total: 2.3 g/dL (ref 1.5–4.5)
Glucose: 88 mg/dL (ref 70–99)
Potassium: 4.2 mmol/L (ref 3.5–5.2)
Sodium: 141 mmol/L (ref 134–144)
Total Protein: 6.4 g/dL (ref 6.0–8.5)
eGFR: 53 mL/min/1.73 — ABNORMAL LOW

## 2024-01-31 LAB — HEMOGLOBIN A1C
Est. average glucose Bld gHb Est-mCnc: 111 mg/dL
Hgb A1c MFr Bld: 5.5 % (ref 4.8–5.6)

## 2024-01-31 LAB — VITAMIN B12: Vitamin B-12: 700 pg/mL (ref 232–1245)

## 2024-01-31 LAB — VITAMIN D 25 HYDROXY (VIT D DEFICIENCY, FRACTURES): Vit D, 25-Hydroxy: 57.3 ng/mL (ref 30.0–100.0)

## 2024-02-21 ENCOUNTER — Ambulatory Visit (INDEPENDENT_AMBULATORY_CARE_PROVIDER_SITE_OTHER): Admitting: Family Medicine

## 2024-03-12 ENCOUNTER — Ambulatory Visit (INDEPENDENT_AMBULATORY_CARE_PROVIDER_SITE_OTHER): Admitting: Family Medicine

## 2024-05-30 ENCOUNTER — Inpatient Hospital Stay: Admitting: Hematology and Oncology

## 2024-05-30 ENCOUNTER — Inpatient Hospital Stay
